# Patient Record
Sex: Male | Born: 1965 | Race: White | Hispanic: No | Marital: Single | Smoking: Former smoker
Health system: Southern US, Community
[De-identification: ages and names within clinical notes are randomized; demographics above are authoritative.]

## PROBLEM LIST (undated history)

## (undated) DIAGNOSIS — D649 Anemia, unspecified: Secondary | ICD-10-CM

## (undated) DIAGNOSIS — J9612 Chronic respiratory failure with hypercapnia: Secondary | ICD-10-CM

## (undated) DIAGNOSIS — I10 Essential (primary) hypertension: Secondary | ICD-10-CM

## (undated) DIAGNOSIS — T148XXD Other injury of unspecified body region, subsequent encounter: Secondary | ICD-10-CM

## (undated) DIAGNOSIS — M1990 Unspecified osteoarthritis, unspecified site: Secondary | ICD-10-CM

## (undated) DIAGNOSIS — K52831 Collagenous colitis: Secondary | ICD-10-CM

## (undated) DIAGNOSIS — J449 Chronic obstructive pulmonary disease, unspecified: Secondary | ICD-10-CM

## (undated) DIAGNOSIS — J45909 Unspecified asthma, uncomplicated: Secondary | ICD-10-CM

## (undated) DIAGNOSIS — K219 Gastro-esophageal reflux disease without esophagitis: Secondary | ICD-10-CM

## (undated) DIAGNOSIS — K2270 Barrett's esophagus without dysplasia: Secondary | ICD-10-CM

## (undated) DIAGNOSIS — J690 Pneumonitis due to inhalation of food and vomit: Secondary | ICD-10-CM

## (undated) DIAGNOSIS — G35 Multiple sclerosis: Secondary | ICD-10-CM

## (undated) DIAGNOSIS — N400 Enlarged prostate without lower urinary tract symptoms: Secondary | ICD-10-CM

## (undated) DIAGNOSIS — R0600 Dyspnea, unspecified: Secondary | ICD-10-CM

## (undated) HISTORY — PX: TONSILLECTOMY AND ADENOIDECTOMY: SHX28

## (undated) HISTORY — DX: Anemia, unspecified: D64.9

## (undated) HISTORY — DX: Barrett's esophagus without dysplasia: K22.70

## (undated) HISTORY — PX: HERNIA REPAIR: SHX51

## (undated) HISTORY — PX: OTHER SURGICAL HISTORY: SHX169

## (undated) HISTORY — DX: Gastro-esophageal reflux disease without esophagitis: K21.9

## (undated) HISTORY — DX: Collagenous colitis: K52.831

---

## 2011-11-27 ENCOUNTER — Telehealth: Payer: Self-pay | Admitting: *Deleted

## 2011-11-27 NOTE — Telephone Encounter (Signed)
Received records on patient from [place] stating that he needs PH impedance study and esophgeal manometry, per Dr Jarold Motto we can order a regular ph and mano but for a Impedance study can only be done at [facility], teresa notified and records placed in new patient file she will call back to let us know if patient needs the test after she talks to the MD at the [facility].

## 2011-11-30 DIAGNOSIS — IMO0002 Reserved for concepts with insufficient information to code with codable children: Secondary | ICD-10-CM | POA: Insufficient documentation

## 2012-11-28 ENCOUNTER — Inpatient Hospital Stay (HOSPITAL_COMMUNITY)
Admission: EM | Admit: 2012-11-28 | Discharge: 2012-12-01 | DRG: 193 | Disposition: A | Discharge status: Home / Self Care | Payer: Medicare Other | Attending: Internal Medicine | Admitting: Internal Medicine

## 2012-11-28 ENCOUNTER — Emergency Department (HOSPITAL_COMMUNITY): Payer: Medicare Other

## 2012-11-28 ENCOUNTER — Encounter (HOSPITAL_COMMUNITY): Payer: Self-pay | Admitting: *Deleted

## 2012-11-28 DIAGNOSIS — J189 Pneumonia, unspecified organism: Secondary | ICD-10-CM | POA: Diagnosis present

## 2012-11-28 DIAGNOSIS — J9600 Acute respiratory failure, unspecified whether with hypoxia or hypercapnia: Secondary | ICD-10-CM | POA: Diagnosis present

## 2012-11-28 DIAGNOSIS — I10 Essential (primary) hypertension: Secondary | ICD-10-CM | POA: Diagnosis present

## 2012-11-28 DIAGNOSIS — G35 Multiple sclerosis: Secondary | ICD-10-CM | POA: Diagnosis present

## 2012-11-28 DIAGNOSIS — G9340 Encephalopathy, unspecified: Secondary | ICD-10-CM | POA: Diagnosis present

## 2012-11-28 DIAGNOSIS — R7401 Elevation of levels of liver transaminase levels: Secondary | ICD-10-CM | POA: Diagnosis present

## 2012-11-28 DIAGNOSIS — G9349 Other encephalopathy: Secondary | ICD-10-CM | POA: Diagnosis present

## 2012-11-28 DIAGNOSIS — R112 Nausea with vomiting, unspecified: Secondary | ICD-10-CM | POA: Diagnosis present

## 2012-11-28 DIAGNOSIS — D72829 Elevated white blood cell count, unspecified: Secondary | ICD-10-CM | POA: Diagnosis present

## 2012-11-28 DIAGNOSIS — J154 Pneumonia due to other streptococci: Principal | ICD-10-CM | POA: Diagnosis present

## 2012-11-28 DIAGNOSIS — E876 Hypokalemia: Secondary | ICD-10-CM | POA: Diagnosis present

## 2012-11-28 DIAGNOSIS — G9341 Metabolic encephalopathy: Secondary | ICD-10-CM

## 2012-11-28 HISTORY — DX: Pneumonitis due to inhalation of food and vomit: J69.0

## 2012-11-28 HISTORY — DX: Gastro-esophageal reflux disease without esophagitis: K21.9

## 2012-11-28 HISTORY — DX: Benign prostatic hyperplasia without lower urinary tract symptoms: N40.0

## 2012-11-28 HISTORY — DX: Essential (primary) hypertension: I10

## 2012-11-28 HISTORY — DX: Multiple sclerosis: G35

## 2012-11-28 LAB — CG4 I-STAT (LACTIC ACID): Lactic Acid, Venous: 1.3 mmol/L (ref 0.5–2.2)

## 2012-11-28 LAB — URINALYSIS, ROUTINE W REFLEX MICROSCOPIC
Bilirubin Urine: NEGATIVE
Glucose, UA: NEGATIVE mg/dL
Ketones, ur: 40 mg/dL — AB
Leukocytes, UA: NEGATIVE
Nitrite: NEGATIVE
Protein, ur: 30 mg/dL — AB
Specific Gravity, Urine: 1.022 (ref 1.005–1.030)
Urobilinogen, UA: 0.2 mg/dL (ref 0.0–1.0)
pH: 6 (ref 5.0–8.0)

## 2012-11-28 LAB — COMPREHENSIVE METABOLIC PANEL
ALT: 203 U/L — ABNORMAL HIGH (ref 0–53)
AST: 491 U/L — ABNORMAL HIGH (ref 0–37)
Albumin: 3.6 g/dL (ref 3.5–5.2)
Alkaline Phosphatase: 73 U/L (ref 39–117)
BUN: 16 mg/dL (ref 6–23)
CO2: 29 mEq/L (ref 19–32)
Calcium: 9.9 mg/dL (ref 8.4–10.5)
Chloride: 97 mEq/L (ref 96–112)
Creatinine, Ser: 1.07 mg/dL (ref 0.50–1.35)
GFR calc Af Amer: >90 mL/min (ref >90)
GFR calc non Af Amer: 80 mL/min — ABNORMAL LOW (ref >90)
Glucose, Bld: 83 mg/dL (ref 70–99)
Potassium: 3.9 mEq/L (ref 3.5–5.1)
Sodium: 136 mEq/L (ref 135–145)
Total Bilirubin: 0.4 mg/dL (ref 0.3–1.2)
Total Protein: 8.1 g/dL (ref 6.0–8.3)

## 2012-11-28 LAB — CBC WITH DIFFERENTIAL/PLATELET
Basophils Absolute: 0 10*3/uL (ref 0.0–0.1)
Basophils Relative: 0 % (ref 0–1)
Eosinophils Absolute: 0.3 10*3/uL (ref 0.0–0.7)
Eosinophils Relative: 3 % (ref 0–5)
HCT: 36.4 % — ABNORMAL LOW (ref 39.0–52.0)
Hemoglobin: 11.6 g/dL — ABNORMAL LOW (ref 13.0–17.0)
Lymphocytes Relative: 8 % — ABNORMAL LOW (ref 12–46)
Lymphs Abs: 0.9 10*3/uL (ref 0.7–4.0)
MCH: 26.4 pg (ref 26.0–34.0)
MCHC: 31.9 g/dL (ref 30.0–36.0)
MCV: 82.7 fL (ref 78.0–100.0)
Monocytes Absolute: 1.2 10*3/uL — ABNORMAL HIGH (ref 0.1–1.0)
Monocytes Relative: 10 % (ref 3–12)
Neutro Abs: 10 10*3/uL — ABNORMAL HIGH (ref 1.7–7.7)
Neutrophils Relative %: 80 % — ABNORMAL HIGH (ref 43–77)
Platelets: 272 10*3/uL (ref 150–400)
RBC: 4.4 MIL/uL (ref 4.22–5.81)
RDW: 15.2 % (ref 11.5–15.5)
WBC: 12.4 10*3/uL — ABNORMAL HIGH (ref 4.0–10.5)

## 2012-11-28 LAB — PROTIME-INR
INR: 1.25 (ref 0.00–1.49)
Prothrombin Time: 15.5 seconds — ABNORMAL HIGH (ref 11.6–15.2)

## 2012-11-28 LAB — AMMONIA: Ammonia: 30 umol/L (ref 11–60)

## 2012-11-28 LAB — URINE MICROSCOPIC-ADD ON

## 2012-11-28 LAB — LIPASE, BLOOD: Lipase: 33 U/L (ref 11–59)

## 2012-11-28 LAB — GLUCOSE, CAPILLARY

## 2012-11-28 MED ORDER — LORAZEPAM 2 MG/ML IJ SOLN
1.0000 mg | Freq: Once | INTRAMUSCULAR | Status: AC
  Administered 2012-11-28: 1 mg via INTRAVENOUS
  Filled 2012-11-28: qty 1

## 2012-11-28 MED ORDER — SODIUM CHLORIDE 0.9 % IV BOLUS (SEPSIS)
2000.0000 mL | Freq: Once | INTRAVENOUS | Status: AC
  Administered 2012-11-28: 2000 mL via INTRAVENOUS

## 2012-11-28 MED ORDER — VANCOMYCIN HCL IN DEXTROSE 1-5 GM/200ML-% IV SOLN
1000.0000 mg | Freq: Once | INTRAVENOUS | Status: AC
  Administered 2012-11-28: 1000 mg via INTRAVENOUS
  Filled 2012-11-28: qty 200

## 2012-11-28 MED ORDER — ACETAMINOPHEN 325 MG PO TABS
650.0000 mg | ORAL_TABLET | Freq: Once | ORAL | Status: AC
  Administered 2012-11-28: 650 mg via ORAL
  Filled 2012-11-28: qty 2

## 2012-11-28 MED ORDER — ONDANSETRON HCL 4 MG/2ML IJ SOLN
4.0000 mg | Freq: Once | INTRAMUSCULAR | Status: AC
  Administered 2012-11-28: 4 mg via INTRAVENOUS
  Filled 2012-11-28: qty 2

## 2012-11-28 MED ORDER — PIPERACILLIN-TAZOBACTAM 3.375 G IVPB 30 MIN
3.3750 g | Freq: Once | INTRAVENOUS | Status: AC
  Administered 2012-11-28: 3.375 g via INTRAVENOUS
  Filled 2012-11-28: qty 50

## 2012-11-28 NOTE — ED Notes (Signed)
Patient transported to X-ray 
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
## 2012-11-28 NOTE — ED Notes (Signed)
Randy Greene- Fiance/POA  [phone] or [phone]
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
## 2012-11-28 NOTE — ED Notes (Signed)
Patient transported to CT 

## 2012-11-29 ENCOUNTER — Inpatient Hospital Stay (HOSPITAL_COMMUNITY): Payer: Medicare Other

## 2012-11-29 DIAGNOSIS — G35D Multiple sclerosis, unspecified: Secondary | ICD-10-CM | POA: Diagnosis present

## 2012-11-29 DIAGNOSIS — J189 Pneumonia, unspecified organism: Secondary | ICD-10-CM

## 2012-11-29 DIAGNOSIS — R7401 Elevation of levels of liver transaminase levels: Secondary | ICD-10-CM | POA: Diagnosis present

## 2012-11-29 DIAGNOSIS — G9341 Metabolic encephalopathy: Secondary | ICD-10-CM

## 2012-11-29 DIAGNOSIS — G9340 Encephalopathy, unspecified: Secondary | ICD-10-CM | POA: Diagnosis present

## 2012-11-29 DIAGNOSIS — G35 Multiple sclerosis: Secondary | ICD-10-CM | POA: Diagnosis present

## 2012-11-29 LAB — COMPREHENSIVE METABOLIC PANEL
ALT: 151 U/L — ABNORMAL HIGH (ref 0–53)
AST: 329 U/L — ABNORMAL HIGH (ref 0–37)
Albumin: 3 g/dL — ABNORMAL LOW (ref 3.5–5.2)
Alkaline Phosphatase: 58 U/L (ref 39–117)
BUN: 9 mg/dL (ref 6–23)
CO2: 28 mEq/L (ref 19–32)
Calcium: 8.8 mg/dL (ref 8.4–10.5)
Chloride: 99 mEq/L (ref 96–112)
Creatinine, Ser: 0.7 mg/dL (ref 0.50–1.35)
GFR calc Af Amer: >90 mL/min (ref >90)
GFR calc non Af Amer: >90 mL/min (ref >90)
Glucose, Bld: 114 mg/dL — ABNORMAL HIGH (ref 70–99)
Potassium: 3.1 mEq/L — ABNORMAL LOW (ref 3.5–5.1)
Sodium: 136 mEq/L (ref 135–145)
Total Bilirubin: 0.2 mg/dL — ABNORMAL LOW (ref 0.3–1.2)
Total Protein: 6.6 g/dL (ref 6.0–8.3)

## 2012-11-29 LAB — RAPID URINE DRUG SCREEN, HOSP PERFORMED
Barbiturates: NOT DETECTED
Benzodiazepines: NOT DETECTED
Cocaine: NOT DETECTED
Opiates: NOT DETECTED

## 2012-11-29 LAB — LEGIONELLA ANTIGEN, URINE

## 2012-11-29 LAB — ACETAMINOPHEN LEVEL: Acetaminophen (Tylenol), Serum: <15 ug/mL (ref 10–30)

## 2012-11-29 LAB — CBC
HCT: 31.1 % — ABNORMAL LOW (ref 39.0–52.0)
MCH: 27.1 pg (ref 26.0–34.0)
MCV: 82.5 fL (ref 78.0–100.0)
Platelets: 234 10*3/uL (ref 150–400)
RBC: 3.77 MIL/uL — ABNORMAL LOW (ref 4.22–5.81)

## 2012-11-29 LAB — HIV ANTIBODY (ROUTINE TESTING W REFLEX): HIV: NONREACTIVE

## 2012-11-29 MED ORDER — METOCLOPRAMIDE HCL 10 MG PO TABS
10.0000 mg | ORAL_TABLET | Freq: Every day | ORAL | Status: DC
  Administered 2012-11-29 – 2012-11-30 (×2): 10 mg via ORAL
  Filled 2012-11-29 (×3): qty 1

## 2012-11-29 MED ORDER — GABAPENTIN 300 MG PO CAPS
600.0000 mg | ORAL_CAPSULE | Freq: Three times a day (TID) | ORAL | Status: DC
  Administered 2012-11-29: 600 mg via ORAL
  Filled 2012-11-29 (×3): qty 2

## 2012-11-29 MED ORDER — OXYCODONE HCL 5 MG PO TABS
10.0000 mg | ORAL_TABLET | Freq: Four times a day (QID) | ORAL | Status: DC
  Administered 2012-11-29: 10 mg via ORAL
  Filled 2012-11-29 (×2): qty 1

## 2012-11-29 MED ORDER — DEXTROSE 5 % IV SOLN
500.0000 mg | INTRAVENOUS | Status: DC
  Administered 2012-11-29 – 2012-12-01 (×3): 500 mg via INTRAVENOUS
  Filled 2012-11-29 (×3): qty 500

## 2012-11-29 MED ORDER — BACLOFEN 10 MG PO TABS
10.0000 mg | ORAL_TABLET | Freq: Three times a day (TID) | ORAL | Status: DC
  Administered 2012-11-29 – 2012-12-01 (×6): 10 mg via ORAL
  Filled 2012-11-29 (×9): qty 1

## 2012-11-29 MED ORDER — HYDROXYZINE HCL 25 MG PO TABS
25.0000 mg | ORAL_TABLET | Freq: Two times a day (BID) | ORAL | Status: DC | PRN
  Filled 2012-11-29: qty 1

## 2012-11-29 MED ORDER — ATENOLOL 50 MG PO TABS
50.0000 mg | ORAL_TABLET | Freq: Every morning | ORAL | Status: DC
Start: 2012-11-30 — End: 2012-12-01
  Administered 2012-11-30 – 2012-12-01 (×2): 50 mg via ORAL
  Filled 2012-11-29 (×2): qty 1

## 2012-11-29 MED ORDER — ROPINIROLE HCL 0.5 MG PO TABS
0.5000 mg | ORAL_TABLET | Freq: Every day | ORAL | Status: DC
  Administered 2012-11-29 – 2012-11-30 (×2): 0.5 mg via ORAL
  Filled 2012-11-29 (×4): qty 1

## 2012-11-29 MED ORDER — ALBUTEROL SULFATE HFA 108 (90 BASE) MCG/ACT IN AERS: 2.0000 | INHALATION_SPRAY | Freq: Four times a day (QID) | RESPIRATORY_TRACT | Status: DC | PRN

## 2012-11-29 MED ORDER — ONDANSETRON HCL 8 MG PO TABS: 8.0000 mg | ORAL_TABLET | Freq: Three times a day (TID) | ORAL | Status: DC | PRN

## 2012-11-29 MED ORDER — POTASSIUM CHLORIDE CRYS ER 20 MEQ PO TBCR
40.0000 meq | EXTENDED_RELEASE_TABLET | Freq: Two times a day (BID) | ORAL | Status: AC
  Administered 2012-11-29 (×2): 40 meq via ORAL
  Filled 2012-11-29 (×2): qty 2

## 2012-11-29 MED ORDER — METHYLPHENIDATE HCL 5 MG PO TABS
10.0000 mg | ORAL_TABLET | Freq: Two times a day (BID) | ORAL | Status: DC
  Administered 2012-11-30 – 2012-12-01 (×3): 10 mg via ORAL
  Filled 2012-11-29: qty 1
  Filled 2012-11-29 (×2): qty 2

## 2012-11-29 MED ORDER — DIMETHYL FUMARATE 240 MG PO CPDR: 1.0000 | DELAYED_RELEASE_CAPSULE | Freq: Two times a day (BID) | ORAL | Status: DC

## 2012-11-29 MED ORDER — PANTOPRAZOLE SODIUM 40 MG PO TBEC
40.0000 mg | DELAYED_RELEASE_TABLET | Freq: Every day | ORAL | Status: DC
  Administered 2012-11-29 – 2012-12-01 (×3): 40 mg via ORAL
  Filled 2012-11-29 (×3): qty 1

## 2012-11-29 MED ORDER — OXYCODONE HCL 5 MG PO TABS
15.0000 mg | ORAL_TABLET | Freq: Four times a day (QID) | ORAL | Status: DC
  Administered 2012-11-29 – 2012-12-01 (×6): 15 mg via ORAL
  Filled 2012-11-29: qty 3
  Filled 2012-11-29: qty 1
  Filled 2012-11-29 (×2): qty 3
  Filled 2012-11-29: qty 2
  Filled 2012-11-29 (×2): qty 3

## 2012-11-29 MED ORDER — SODIUM CHLORIDE 0.9 % IV SOLN
INTRAVENOUS | Status: DC
  Administered 2012-11-29 (×2): via INTRAVENOUS

## 2012-11-29 MED ORDER — DEXTROSE 5 % IV SOLN
1.0000 g | INTRAVENOUS | Status: DC
  Administered 2012-11-29 – 2012-12-01 (×3): 1 g via INTRAVENOUS
  Filled 2012-11-29 (×3): qty 10

## 2012-11-29 MED ORDER — ENOXAPARIN SODIUM 30 MG/0.3ML ~~LOC~~ SOLN
30.0000 mg | SUBCUTANEOUS | Status: DC
  Administered 2012-11-29: 30 mg via SUBCUTANEOUS
  Filled 2012-11-29: qty 0.3

## 2012-11-29 MED ORDER — ENOXAPARIN SODIUM 40 MG/0.4ML ~~LOC~~ SOLN
40.0000 mg | SUBCUTANEOUS | Status: DC
  Administered 2012-11-30 – 2012-12-01 (×2): 40 mg via SUBCUTANEOUS
  Filled 2012-11-29 (×2): qty 0.4

## 2012-11-29 MED ORDER — DICYCLOMINE HCL 10 MG PO CAPS
10.0000 mg | ORAL_CAPSULE | Freq: Three times a day (TID) | ORAL | Status: DC
  Administered 2012-11-29 – 2012-12-01 (×7): 10 mg via ORAL
  Filled 2012-11-29 (×12): qty 1

## 2012-11-29 MED ORDER — DRONABINOL 5 MG PO CAPS
5.0000 mg | ORAL_CAPSULE | Freq: Three times a day (TID) | ORAL | Status: DC
  Administered 2012-11-29 – 2012-12-01 (×6): 5 mg via ORAL
  Filled 2012-11-29: qty 1
  Filled 2012-11-29: qty 2
  Filled 2012-11-29 (×4): qty 1

## 2012-11-29 MED ORDER — GABAPENTIN 300 MG PO CAPS
300.0000 mg | ORAL_CAPSULE | Freq: Three times a day (TID) | ORAL | Status: DC
  Administered 2012-11-29 – 2012-12-01 (×6): 300 mg via ORAL
  Filled 2012-11-29 (×9): qty 1
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
## 2012-11-29 NOTE — Progress Notes (Signed)
Patient was admitted earlier today. H&P reviewed.  S: Patient feels better. Denies any pain. No vomiting or nausea.  O: VSS No distress. Crackles left base. No wheezing. S1S2 normal regular. No s3 s4. No rubs, murmurs or bruits. Abdomen is soft. No RUQ tenderness. No tenderness elsewhere. BS present. No masses or organomegaly. He is alert and oriented x 3. No focal deficits. No edema  Labs reviewed.  A/P: Acute Encephalopathy Appears to have resolved. Unclear etiology. Ct was unremarkable. Hold off on MRI for now. Could be from infection.  Transaminitis Slightly better today. He still has a gallbladder but is non tender in RUQ. Will obtain US. Continue to monitor LFT's. Tecfidera (MS medicine) can cause abnormal AST. Will hold this medicine for now.  LLL Pneumonia: CAP vs aspiration Strep is positive. Continue current abx. Patient is stable.  History of MS Stable.Monitor  Hypokalemia Replete  If [place] looks benign he can be transferred to MedSurg bed.  Randy Greene 11/29/2012 8:43 AM

## 2012-11-29 NOTE — ED Notes (Signed)
Called Clydie Braun (pts fiance) to make her aware of pts status and location.

## 2012-11-29 NOTE — Progress Notes (Signed)
CARE MANAGEMENT NOTE 11/29/2012  Patient:  Randy Greene, Randy Greene   Account Number:  [id number]  Date Initiated:  11/29/2012  Documentation initiated by:  Yandiel Bergum  Subjective/Objective Assessment:   pt with known hisotry of ms present with ams due to unknown cause.     Action/Plan:   from home lives with spouse   Anticipated DC Date:  12/02/2012   Anticipated DC Plan:  HOME/SELF CARE  In-house referral  NA      DC Planning Services  NA      [facility] Choice  NA   Choice offered to / List presented to:  NA   DME arranged  NA      DME agency  NA     HH arranged  NA      HH agency  NA   Status of service:  In process, will continue to follow Medicare Important Message given?  NA - LOS <3 / Initial given by admissions (If response is "NO", the following Medicare IM given date fields will be blank) Date Medicare IM given:   Date Additional Medicare IM given:    Discharge Disposition:    Per UR Regulation:  Reviewed for med. necessity/level of care/duration of stay  If discussed at Long Length of Stay Meetings, dates discussed:    Comments:  [id number]/Armida Vickroy Earlene Plater, RN, BSN, CCM:  CHART REVIEWED AND UPDATED.  Next chart review due on [id number]. NO DISCHARGE NEEDS PRESENT AT THIS TIME. CASE MANAGEMENT [phone]

## 2012-11-30 DIAGNOSIS — G35 Multiple sclerosis: Secondary | ICD-10-CM

## 2012-11-30 DIAGNOSIS — E876 Hypokalemia: Secondary | ICD-10-CM | POA: Diagnosis present

## 2012-11-30 DIAGNOSIS — G9340 Encephalopathy, unspecified: Secondary | ICD-10-CM

## 2012-11-30 LAB — HEPATITIS PANEL, ACUTE
HCV Ab: NEGATIVE
Hep A IgM: NEGATIVE
Hepatitis B Surface Ag: NEGATIVE

## 2012-11-30 LAB — CBC
HCT: 32.6 % — ABNORMAL LOW (ref 39.0–52.0)
MCH: 25.9 pg — ABNORMAL LOW (ref 26.0–34.0)
MCHC: 31.6 g/dL (ref 30.0–36.0)
MCV: 81.9 fL (ref 78.0–100.0)
Platelets: 238 10*3/uL (ref 150–400)
RDW: 15.3 % (ref 11.5–15.5)

## 2012-11-30 LAB — COMPREHENSIVE METABOLIC PANEL
AST: 170 U/L — ABNORMAL HIGH (ref 0–37)
Albumin: 2.9 g/dL — ABNORMAL LOW (ref 3.5–5.2)
BUN: 4 mg/dL — ABNORMAL LOW (ref 6–23)
Calcium: 8.9 mg/dL (ref 8.4–10.5)
Chloride: 102 mEq/L (ref 96–112)
Creatinine, Ser: 0.7 mg/dL (ref 0.50–1.35)
Total Bilirubin: 0.2 mg/dL — ABNORMAL LOW (ref 0.3–1.2)

## 2012-11-30 MED ORDER — POTASSIUM CHLORIDE CRYS ER 20 MEQ PO TBCR
40.0000 meq | EXTENDED_RELEASE_TABLET | Freq: Once | ORAL | Status: AC
  Administered 2012-11-30: 40 meq via ORAL
  Filled 2012-11-30: qty 2

## 2012-11-30 NOTE — Progress Notes (Addendum)
[facility] PROGRESS NOTE  Randy Greene NFA:[id number] DOB: [birth year] DOA: 11/28/2012 PCP: No primary provider on file.  Brief narrative: 47 yo male with history of MS who was admitted to [facility] ED 11/28/2012 for acute encephalopathy. In addition, patient had nausea and vomiting associated with fevers and chills. Due to mental status changes patient was not a good historian at the time of admission. His mental status at this time is back to baseline where he is alert and oriented to time, place and person. Patient was subsequently found to have left lower lobe pneumonia and was started on azithromycin and ceftriaxone.  Assessment and Plan:   Principal problem: Acute Encephalopathy  - Likely related to infectious process, left lower lobe pneumonia - Urine drug screen positive for THC - Ethanol negative  - mental status is back to baseline with patient being alert and oriented to time, place and person  Active problems: Acute hypoxic respiratory failure  - Related to left lower lobe pneumonia, strep pneumonia - Patient initially admitted to step down unit and now on medical floor - Strep urine antigen is positive for strep pneumonia - Urine Legionella negative - Continue azithromycin and ceftriaxone - Blood cultures to date negative Leukocytosis  - likely secondary to PNA  - Continue antibiotics as above Transaminitis  - unclear etiology - UDS positive for THC; ethanol negative - repeat CMET in AM   Code Status: Full code Family Communication: No family at the bedside Disposition Plan: Home in next 24 hours  Manson Passey, MD  [facility] Pager [phone]  If 7PM-7AM, please contact night-coverage [email or web address] Password [facility] 11/30/2012, 11:31 AM   LOS: 2 days   Consultants:  None  Procedures:  None  Antibiotics:  Azithromycin 11/28/2012 -->  Rocephin 11/28/2012 -->  HPI/Subjective: No acute overnight events.  Objective: Filed Vitals:   11/29/12 1445 11/29/12 1600  11/29/12 1816 11/30/12 0425  BP:   129/79 149/85  Pulse:   91 85  Temp: 99 F (37.2 C) 98.2 F (36.8 C) 98.8 F (37.1 C) 98.9 F (37.2 C)  TempSrc: Oral Oral Oral Oral  Resp:   18 15  Height:   6\' 2"  (1.88 m)   Weight:   92.987 kg (205 lb)   SpO2:   98% 96%    Intake/Output Summary (Last 24 hours) at 11/30/12 1131 Last data filed at 11/30/12 0900  Gross per 24 hour  Intake   1530 ml  Output      0 ml  Net   1530 ml    Exam:   General:  Pt is alert, follows commands appropriately, not in acute distress  Cardiovascular: Regular rate and rhythm, S1/S2, no murmurs, no rubs, no gallops  Respiratory: Clear to auscultation bilaterally, no wheezing, no crackles, no rhonchi  Abdomen: Soft, non tender, non distended, bowel sounds present, no guarding  Extremities: No edema, pulses DP and PT palpable bilaterally  Neuro: Grossly nonfocal  Data Reviewed: Basic Metabolic Panel:  Recent Labs Lab 11/28/12 2008 11/29/12 0520 11/30/12 0405  NA 136 136 137  K 3.9 3.1* 3.4*  CL 97 99 102  CO2 29 28 26   GLUCOSE 83 114* 112*  BUN 16 9 4*  CREATININE 1.07 0.70 0.70  CALCIUM 9.9 8.8 8.9   Liver Function Tests:  Recent Labs Lab 11/28/12 2008 11/29/12 0520 11/30/12 0405  AST 491* 329* 170*  ALT 203* 151* 123*  ALKPHOS 73 58 53  BILITOT 0.4 0.2* 0.2*  PROT 8.1 6.6 6.3  ALBUMIN 3.6  3.0* 2.9*    Recent Labs Lab 11/28/12 2008  LIPASE 33    Recent Labs Lab 11/28/12 2130  AMMONIA 30   CBC:  Recent Labs Lab 11/28/12 2008 11/29/12 0520 11/30/12 0405  WBC 12.4* 10.0 6.1  NEUTROABS 10.0*  --   --   HGB 11.6* 10.2* 10.3*  HCT 36.4* 31.1* 32.6*  MCV 82.7 82.5 81.9  PLT 272 234 238   Cardiac Enzymes: No results found for this basename: CKTOTAL, CKMB, CKMBINDEX, TROPONINI,  in the last 168 hours BNP: No components found with this basename: POCBNP,  CBG:  Recent Labs Lab 11/28/12 1949  GLUCAP 87    CULTURE, BLOOD (ROUTINE X 2)     Status: None    Collection Time    11/28/12  8:30 PM      Result Value Range Status   Specimen Description BLOOD RIGHT ANTECUBITAL   Final   Special Requests BOTTLES DRAWN AEROBIC AND ANAEROBIC 5CC   Final   Culture  Setup Time 11/29/2012 03:49   Final   Culture     Final   Value:        BLOOD CULTURE RECEIVED NO GROWTH TO DATE CULTURE WILL BE HELD FOR 5 DAYS BEFORE ISSUING A FINAL NEGATIVE REPORT   Report Status PENDING   Incomplete  CULTURE, BLOOD (ROUTINE X 2)     Status: None   Collection Time    11/28/12  9:05 PM      Result Value Range Status   Specimen Description BLOOD LEFT HAND   Final   Special Requests BOTTLES DRAWN AEROBIC ONLY 3CC   Final   Culture  Setup Time 11/29/2012 03:47   Final   Culture     Final   Value:        BLOOD CULTURE RECEIVED NO GROWTH TO DATE CULTURE WILL BE HELD FOR 5 DAYS BEFORE ISSUING A FINAL NEGATIVE REPORT   Report Status PENDING   Incomplete  MRSA PCR SCREENING     Status: None   Collection Time    11/29/12  2:29 AM      Result Value Range Status   MRSA by PCR NEGATIVE  NEGATIVE Final     Studies: Dg Chest 2 View 11/28/2012   *RADIOLOGY REPORT*  Clinical Data: Fever, cough  CHEST - 2 VIEW  Comparison: None.  Findings: Patchy left lower lobe airspace opacities. The right lung clear.  Mild cardiomegaly.  No effusion.  Regional bones unremarkable.  IMPRESSION:  1.  Patchy left lower lobe airspace opacities suggesting pneumonia.   Original Report Authenticated By: D. Andria Rhein, MD   Ct Head Wo Contrast 11/28/2012   *RADIOLOGY REPORT*  Clinical Data: Nausea and vomiting last week.  Altered mental status with difficulty in waking.  Confusion.  Multiple falls of home today.  History of multiple sclerosis.  CT HEAD WITHOUT CONTRAST  Technique:  Contiguous axial images were obtained from the base of the skull through the vertex without contrast.  Comparison: None.  Findings: Technically limited study due to patient motion during the examination.  Although multiple serieses  are obtained, there is only segmental imaging of the brain and the entire intracranial contents do not appear be included within the study.  Visualized intracranial contents appear unremarkable.  There is no obvious mass effect or midline shift.  Visualized parenchymal pattern is normal.  No acute intracranial hemorrhage demonstrated in the visualized portions of the brain.  There is evidence of inflammatory change in the paranasal sinuses  with mucosal thickening in the frontal sinuses and ethmoid air cells.  IMPRESSION: Technically limited study due to motion artifact.  The entire intracranial contents are not included on the study.  Visualized intracranial contents demonstrate no acute changes.   Original Report Authenticated By: Burman Nieves, M.D.   US Abdomen Complete 11/29/2012   *RADIOLOGY REPORT*  Abdominal ultrasound  History:  Abnormal liver enzymes  Findings:  Gallbladder is visualized in multiple projections. There are no gallstones, gallbladder wall thickening, or pericholecystic fluid collection.  There is no intrahepatic, common hepatic, or common bile duct dilatation.  Pancreas is obscured by gas and not seen.  No focal liver lesions are appreciated.  There are occasional calcified splenic granulomas.  Spleen otherwise appears normal.  There is a 5 mm echogenic focus in the upper pole of the right kidney.  Kidneys bilaterally otherwise appear normal.  There is no ascites.  Aorta is nonaneurysmal.  Inferior vena cava appears patent.  Conclusion:  Pancreas obscured by gas.  Occasional splenic granulomas.  There is a 5 mm echogenic focus in the upper pole of the right kidney which prior represents a small angiomyolipoma. This finding warrants a followup study in 6 to 12 months to assess for stability.  Study otherwise unremarkable.   Original Report Authenticated By: Bretta Bang, M.D.    Scheduled Meds: . atenolol  50 mg Oral q morning - 10a  . azithromycin  500 mg Intravenous Q24H  .  baclofen  10 mg Oral TID  . cefTRIAXone  1 g Intravenous Q24H  . dicyclomine  10 mg Oral TID AC & HS  . enoxaparin (LOVENOX)   40 mg Subcutaneous Q24H  . gabapentin  300 mg Oral TID  . metoCLOPramide  10 mg Oral QHS  . oxyCODONE  15 mg Oral QID  . pantoprazole  40 mg Oral Daily  . rOPINIRole  0.5 mg Oral QHS

## 2012-12-01 DIAGNOSIS — R7402 Elevation of levels of lactic acid dehydrogenase (LDH): Secondary | ICD-10-CM

## 2012-12-01 LAB — BASIC METABOLIC PANEL
BUN: 5 mg/dL — ABNORMAL LOW (ref 6–23)
Calcium: 9 mg/dL (ref 8.4–10.5)
GFR calc Af Amer: >90 mL/min (ref >90)
GFR calc non Af Amer: >90 mL/min (ref >90)
Glucose, Bld: 99 mg/dL (ref 70–99)
Potassium: 3.6 mEq/L (ref 3.5–5.1)
Sodium: 137 mEq/L (ref 135–145)

## 2012-12-01 MED ORDER — LEVOFLOXACIN 500 MG PO TABS: 500.0000 mg | ORAL_TABLET | Freq: Every day | ORAL | Status: DC

## 2012-12-01 NOTE — Discharge Summary (Signed)
Physician Discharge Summary  Sajjad Vana AVW:[id number] DOB: Feb 05, 1966 DOA: 11/28/2012  PCP: No primary provider on file.  Admit date: 11/28/2012 Discharge date: 12/01/2012  Recommendations for Outpatient Follow-up:  1. Follow up with PCP in 1-2 weeks post discahrge  Discharge Diagnoses:  Principal Problem:   CAP (community acquired pneumonia) Active Problems:   Transaminitis   Acute encephalopathy   MS (multiple sclerosis)   Hypokalemia   Discharge Condition: medically stable for discharge home today  Diet recommendation: as toelrated  History of present illness:  47 yo male with history of MS who was admitted to [facility] ED 11/28/2012 for acute encephalopathy. In addition, patient had nausea and vomiting associated with fevers and chills. Due to mental status changes patient was not a good historian at the time of admission. His mental status at this time is back to baseline where he is alert and oriented to time, place and person. Patient was subsequently found to have left lower lobe pneumonia and was started on azithromycin and ceftriaxone.   Assessment and Plan:   Principal problem:  Acute Encephalopathy  - Likely related to infectious process, left lower lobe pneumonia  - Urine drug screen positive for THC  - Ethanol negative  - mental status is back to baseline with patient being alert and oriented to time, place and person  Active problems:  Acute hypoxic respiratory failure  - Related to left lower lobe pneumonia, strep pneumonia  - Patient initially admitted to step down unit and now on medical floor  - Strep urine antigen is positive for strep pneumonia  - Urine Legionella negative  - Blood cultures to date negative. We will stop azithromycin and ceftriaxone today. - we will continue 4 more days of Levaquin upon discharge  Leukocytosis  - likely secondary to PNA  - Continue antibiotics as above  Transaminitis  - unclear etiology  - UDS positive for THC; ethanol  negative   Code Status: Full code  Family Communication: No family at the bedside    Manson Passey, MD  [facility]  Pager [phone]   Consultants:  None Procedures:  None Antibiotics:  Azithromycin 11/28/2012 --> 12/01/2012 Rocephin 11/28/2012 --> 12/01/2012 Levaquin for 4 more days on discharge    Discharge Exam: Filed Vitals:   12/01/12 0500  BP: 129/88  Pulse: 64  Temp: 98.5 F (36.9 C)  Resp: 16   Filed Vitals:   11/30/12 1418 11/30/12 1436 11/30/12 2016 12/01/12 0500  BP: 138/88 138/88 137/94 129/88  Pulse: 70 76 64 64  Temp: 98.4 F (36.9 C)  98.6 F (37 C) 98.5 F (36.9 C)  TempSrc: Oral  Oral Oral  Resp: 18  18 16   Height:      Weight:      SpO2: 99%  98% 96%    General: Pt is alert, follows commands appropriately, not in acute distress Cardiovascular: Regular rate and rhythm, S1/S2 +, no murmurs, no rubs, no gallops Respiratory: Clear to auscultation bilaterally, no wheezing, no crackles, no rhonchi Abdominal: Soft, non tender, non distended, bowel sounds +, no guarding Extremities: no edema, no cyanosis, pulses palpable bilaterally DP and PT Neuro: Grossly nonfocal  Discharge Instructions  Discharge Orders   Future Orders Complete By Expires     Call MD for:  difficulty breathing, headache or visual disturbances  As directed     Call MD for:  persistant dizziness or light-headedness  As directed     Call MD for:  persistant nausea and vomiting  As directed  Call MD for:  severe uncontrolled pain  As directed     Diet - low sodium heart healthy  As directed     Increase activity slowly  As directed         Medication List    TAKE these medications       albuterol 108 (90 BASE) MCG/ACT inhaler  Commonly known as:  PROVENTIL HFA;VENTOLIN HFA  Inhale 2 puffs into the lungs every 6 (six) hours as needed for wheezing or shortness of breath.     atenolol 50 MG tablet  Commonly known as:  TENORMIN  Take 50 mg by mouth every morning.      atorvastatin 40 MG tablet  Commonly known as:  LIPITOR  Take 20 mg by mouth at bedtime.     B Complex Vitamins Liqd  Place under the tongue.     baclofen 10 MG tablet  Commonly known as:  LIORESAL  Take 10 mg by mouth 3 (three) times daily.     Calcium Carbonate 500 MG Chew  Chew 3 tablets by mouth daily.     dicyclomine 10 MG capsule  Commonly known as:  BENTYL  Take 10 mg by mouth 4 (four) times daily -  before meals and at bedtime.     donepezil 10 MG tablet  Commonly known as:  ARICEPT  Take 10 mg by mouth daily.     doxazosin 4 MG tablet  Commonly known as:  CARDURA  Take 2 mg by mouth at bedtime.     dronabinol 2.5 MG capsule  Commonly known as:  MARINOL  Take 5 mg by mouth 3 (three) times daily.     ergocalciferol 8000 UNIT/ML drops  Commonly known as:  DRISDOL  Take by mouth daily.     gabapentin 300 MG capsule  Commonly known as:  NEURONTIN  Take 600 mg by mouth 4 (four) times daily.     hydrOXYzine 25 MG tablet  Commonly known as:  ATARAX/VISTARIL  Take 25 mg by mouth 2 (two) times daily as needed for anxiety (sleep).     imiquimod 5 % cream  Commonly known as:  ALDARA  Apply 1 application topically 3 (three) times a week.     lactobacillus acidophilus Tabs  Take 2 tablets by mouth 2 (two) times daily.     levofloxacin 500 MG tablet  Commonly known as:  LEVAQUIN  Take 1 tablet (500 mg total) by mouth daily.     Menthol-Methyl Salicylate Crea  Apply 1 application topically 3 (three) times daily as needed (back pain).     methylphenidate 10 MG tablet  Commonly known as:  RITALIN  Take 10 mg by mouth 2 (two) times daily. Take one tablet in the morning and at noon     metoCLOPramide 10 MG tablet  Commonly known as:  REGLAN  Take 10 mg by mouth at bedtime.     modafinil 200 MG tablet  Commonly known as:  PROVIGIL  Take 200 mg by mouth 2 (two) times daily. Take one tablet in the morning and at noon.     NALTREXONE HCL PO  Take 4.5 mg by mouth  daily. Recommended dose is 4.5mg  daily.  Use a 50mg  pill and cut in 1/2 (25mg ).  Cut 1/2 pill into 6 pieces (4.17mg ) or 5 pieces (5mg ).     naproxen 500 MG tablet  Commonly known as:  NAPROSYN  Take 500 mg by mouth 2 (two) times daily as needed (pain). Take with  food.     ondansetron 8 MG tablet  Commonly known as:  ZOFRAN  Take 8 mg by mouth every 8 (eight) hours as needed for nausea.     oxyCODONE 15 MG immediate release tablet  Commonly known as:  ROXICODONE  Take 15 mg by mouth 4 (four) times daily.     pantoprazole 40 MG tablet  Commonly known as:  PROTONIX  Take 40 mg by mouth 2 (two) times daily.     potassium chloride 10 MEQ tablet  Commonly known as:  K-DUR,KLOR-CON  Take 10 mEq by mouth daily.     ranitidine 150 MG tablet  Commonly known as:  ZANTAC  Take 150 mg by mouth 2 (two) times daily.     rOPINIRole 0.5 MG tablet  Commonly known as:  REQUIP  Take 0.5 mg by mouth at bedtime.     Tadalafil (PAH) 20 MG Tabs  Take 20 mg by mouth daily as needed.     TECFIDERA 240 MG Cpdr  Generic drug:  Dimethyl Fumarate  Take 1 capsule by mouth 2 (two) times daily.          The results of significant diagnostics from this hospitalization (including imaging, microbiology, ancillary and laboratory) are listed below for reference.    Significant Diagnostic Studies: Dg Chest 2 View  11/28/2012   *RADIOLOGY REPORT*  Clinical Data: Fever, cough  CHEST - 2 VIEW  Comparison: None.  Findings: Patchy left lower lobe airspace opacities. The right lung clear.  Mild cardiomegaly.  No effusion.  Regional bones unremarkable.  IMPRESSION:  1.  Patchy left lower lobe airspace opacities suggesting pneumonia.   Original Report Authenticated By: D. Andria Rhein, MD   Ct Head Wo Contrast  11/28/2012   *RADIOLOGY REPORT*  Clinical Data: Nausea and vomiting last week.  Altered mental status with difficulty in waking.  Confusion.  Multiple falls of home today.  History of multiple sclerosis.  CT  HEAD WITHOUT CONTRAST  Technique:  Contiguous axial images were obtained from the base of the skull through the vertex without contrast.  Comparison: None.  Findings: Technically limited study due to patient motion during the examination.  Although multiple serieses are obtained, there is only segmental imaging of the brain and the entire intracranial contents do not appear be included within the study.  Visualized intracranial contents appear unremarkable.  There is no obvious mass effect or midline shift.  Visualized parenchymal pattern is normal.  No acute intracranial hemorrhage demonstrated in the visualized portions of the brain.  There is evidence of inflammatory change in the paranasal sinuses with mucosal thickening in the frontal sinuses and ethmoid air cells.  IMPRESSION: Technically limited study due to motion artifact.  The entire intracranial contents are not included on the study.  Visualized intracranial contents demonstrate no acute changes.   Original Report Authenticated By: Burman Nieves, M.D.   US Abdomen Complete  11/29/2012   *RADIOLOGY REPORT*  Abdominal ultrasound  History:  Abnormal liver enzymes  Findings:  Gallbladder is visualized in multiple projections. There are no gallstones, gallbladder wall thickening, or pericholecystic fluid collection.  There is no intrahepatic, common hepatic, or common bile duct dilatation.  Pancreas is obscured by gas and not seen.  No focal liver lesions are appreciated.  There are occasional calcified splenic granulomas.  Spleen otherwise appears normal.  There is a 5 mm echogenic focus in the upper pole of the right kidney.  Kidneys bilaterally otherwise appear normal.  There is no ascites.  Aorta is nonaneurysmal.  Inferior vena cava appears patent.  Conclusion:  Pancreas obscured by gas.  Occasional splenic granulomas.  There is a 5 mm echogenic focus in the upper pole of the right kidney which prior represents a small angiomyolipoma. This finding  warrants a followup study in 6 to 12 months to assess for stability.  Study otherwise unremarkable.   Original Report Authenticated By: Bretta Bang, M.D.    Microbiology: Recent Results (from the past 240 hour(s))  CULTURE, BLOOD (ROUTINE X 2)     Status: None   Collection Time    11/28/12  8:30 PM      Result Value Range Status   Specimen Description BLOOD RIGHT ANTECUBITAL   Final   Special Requests BOTTLES DRAWN AEROBIC AND ANAEROBIC 5CC   Final   Culture  Setup Time 11/29/2012 03:49   Final   Culture     Final   Value:        BLOOD CULTURE RECEIVED NO GROWTH TO DATE CULTURE WILL BE HELD FOR 5 DAYS BEFORE ISSUING A FINAL NEGATIVE REPORT   Report Status PENDING   Incomplete  CULTURE, BLOOD (ROUTINE X 2)     Status: None   Collection Time    11/28/12  9:05 PM      Result Value Range Status   Specimen Description BLOOD LEFT HAND   Final   Special Requests BOTTLES DRAWN AEROBIC ONLY 3CC   Final   Culture  Setup Time 11/29/2012 03:47   Final   Culture     Final   Value:        BLOOD CULTURE RECEIVED NO GROWTH TO DATE CULTURE WILL BE HELD FOR 5 DAYS BEFORE ISSUING A FINAL NEGATIVE REPORT   Report Status PENDING   Incomplete  MRSA PCR SCREENING     Status: None   Collection Time    11/29/12  2:29 AM      Result Value Range Status   MRSA by PCR NEGATIVE  NEGATIVE Final     Labs: Basic Metabolic Panel:  Recent Labs Lab 11/28/12 2008 11/29/12 0520 11/30/12 0405 12/01/12 0455  NA 136 136 137 137  K 3.9 3.1* 3.4* 3.6  CL 97 99 102 101  CO2 29 28 26 26   GLUCOSE 83 114* 112* 99  BUN 16 9 4* 5*  CREATININE 1.07 0.70 0.70 0.70  CALCIUM 9.9 8.8 8.9 9.0   Liver Function Tests:  Recent Labs Lab 11/28/12 2008 11/29/12 0520 11/30/12 0405  AST 491* 329* 170*  ALT 203* 151* 123*  ALKPHOS 73 58 53  BILITOT 0.4 0.2* 0.2*  PROT 8.1 6.6 6.3  ALBUMIN 3.6 3.0* 2.9*    Recent Labs Lab 11/28/12 2008  LIPASE 33    Recent Labs Lab 11/28/12 2130  AMMONIA 30    CBC:  Recent Labs Lab 11/28/12 2008 11/29/12 0520 11/30/12 0405  WBC 12.4* 10.0 6.1  NEUTROABS 10.0*  --   --   HGB 11.6* 10.2* 10.3*  HCT 36.4* 31.1* 32.6*  MCV 82.7 82.5 81.9  PLT 272 234 238   Cardiac Enzymes: No results found for this basename: CKTOTAL, CKMB, CKMBINDEX, TROPONINI,  in the last 168 hours BNP: BNP (last 3 results) No results found for this basename: PROBNP,  in the last 8760 hours CBG:  Recent Labs Lab 11/28/12 1949  GLUCAP 87    Time coordinating discharge: Over 30 minutes  Signed:  Manson Passey, MD  [facility]  12/01/2012, 9:55 AM  Pager #: [phone]

## 2012-12-01 NOTE — Progress Notes (Signed)
Pt discharged home in stable condition. Discharge instructions and scripts given. Pt verbalized understanding 

## 2012-12-05 LAB — CULTURE, BLOOD (ROUTINE X 2)
Culture: NO GROWTH
Culture: NO GROWTH

## 2013-04-10 ENCOUNTER — Emergency Department (HOSPITAL_COMMUNITY): Payer: Non-veteran care

## 2013-04-10 ENCOUNTER — Inpatient Hospital Stay (HOSPITAL_COMMUNITY)
Admission: EM | Admit: 2013-04-10 | Discharge: 2013-04-12 | DRG: 871 | Disposition: A | Discharge status: Home / Self Care | Payer: Non-veteran care | Attending: Internal Medicine | Admitting: Internal Medicine

## 2013-04-10 ENCOUNTER — Encounter (HOSPITAL_COMMUNITY): Payer: Self-pay | Admitting: Emergency Medicine

## 2013-04-10 DIAGNOSIS — G9340 Encephalopathy, unspecified: Secondary | ICD-10-CM | POA: Diagnosis present

## 2013-04-10 DIAGNOSIS — J690 Pneumonitis due to inhalation of food and vomit: Secondary | ICD-10-CM

## 2013-04-10 DIAGNOSIS — I10 Essential (primary) hypertension: Secondary | ICD-10-CM | POA: Diagnosis present

## 2013-04-10 DIAGNOSIS — J9600 Acute respiratory failure, unspecified whether with hypoxia or hypercapnia: Secondary | ICD-10-CM | POA: Diagnosis present

## 2013-04-10 DIAGNOSIS — E785 Hyperlipidemia, unspecified: Secondary | ICD-10-CM | POA: Diagnosis present

## 2013-04-10 DIAGNOSIS — R7401 Elevation of levels of liver transaminase levels: Secondary | ICD-10-CM | POA: Diagnosis present

## 2013-04-10 DIAGNOSIS — A419 Sepsis, unspecified organism: Principal | ICD-10-CM | POA: Diagnosis present

## 2013-04-10 DIAGNOSIS — R000 Tachycardia, unspecified: Secondary | ICD-10-CM | POA: Diagnosis present

## 2013-04-10 DIAGNOSIS — G35 Multiple sclerosis: Secondary | ICD-10-CM | POA: Diagnosis present

## 2013-04-10 DIAGNOSIS — G35D Multiple sclerosis, unspecified: Secondary | ICD-10-CM | POA: Diagnosis present

## 2013-04-10 DIAGNOSIS — R0602 Shortness of breath: Secondary | ICD-10-CM | POA: Diagnosis present

## 2013-04-10 DIAGNOSIS — Z87891 Personal history of nicotine dependence: Secondary | ICD-10-CM

## 2013-04-10 DIAGNOSIS — J189 Pneumonia, unspecified organism: Secondary | ICD-10-CM | POA: Diagnosis present

## 2013-04-10 DIAGNOSIS — E876 Hypokalemia: Secondary | ICD-10-CM

## 2013-04-10 DIAGNOSIS — R7402 Elevation of levels of lactic acid dehydrogenase (LDH): Secondary | ICD-10-CM | POA: Diagnosis present

## 2013-04-10 LAB — CBC
HCT: 38.9 % — ABNORMAL LOW (ref 39.0–52.0)
Hemoglobin: 12.5 g/dL — ABNORMAL LOW (ref 13.0–17.0)
RBC: 4.69 MIL/uL (ref 4.22–5.81)
RDW: 15.1 % (ref 11.5–15.5)
WBC: 13 10*3/uL — ABNORMAL HIGH (ref 4.0–10.5)

## 2013-04-10 LAB — RAPID URINE DRUG SCREEN, HOSP PERFORMED
Amphetamines: NOT DETECTED
Barbiturates: NOT DETECTED
Benzodiazepines: NOT DETECTED
Cocaine: POSITIVE — AB
Opiates: NOT DETECTED
Tetrahydrocannabinol: POSITIVE — AB

## 2013-04-10 LAB — BASIC METABOLIC PANEL
BUN: 9 mg/dL (ref 6–23)
CO2: 27 mEq/L (ref 19–32)
Chloride: 98 mEq/L (ref 96–112)
GFR calc Af Amer: >90 mL/min (ref >90)
Potassium: 3.7 mEq/L (ref 3.5–5.1)

## 2013-04-10 LAB — URINALYSIS, ROUTINE W REFLEX MICROSCOPIC
Hgb urine dipstick: NEGATIVE
Nitrite: NEGATIVE
Specific Gravity, Urine: 1.016 (ref 1.005–1.030)
Urobilinogen, UA: 0.2 mg/dL (ref 0.0–1.0)
pH: 7 (ref 5.0–8.0)

## 2013-04-10 LAB — STREP PNEUMONIAE URINARY ANTIGEN: Strep Pneumo Urinary Antigen: NEGATIVE

## 2013-04-10 LAB — CG4 I-STAT (LACTIC ACID): Lactic Acid, Venous: 2.2 mmol/L (ref 0.5–2.2)

## 2013-04-10 MED ORDER — DRONABINOL 2.5 MG PO CAPS
5.0000 mg | ORAL_CAPSULE | Freq: Three times a day (TID) | ORAL | Status: DC
  Administered 2013-04-10 – 2013-04-11 (×4): 5 mg via ORAL
  Filled 2013-04-10 (×5): qty 2

## 2013-04-10 MED ORDER — MODAFINIL 200 MG PO TABS: 200.0000 mg | ORAL_TABLET | Freq: Two times a day (BID) | ORAL | Status: DC

## 2013-04-10 MED ORDER — METHYLPHENIDATE HCL 5 MG PO TABS: 10.0000 mg | ORAL_TABLET | Freq: Two times a day (BID) | ORAL | Status: DC

## 2013-04-10 MED ORDER — ATENOLOL 50 MG PO TABS
50.0000 mg | ORAL_TABLET | Freq: Every morning | ORAL | Status: DC
  Administered 2013-04-11 – 2013-04-12 (×2): 50 mg via ORAL
  Filled 2013-04-10 (×2): qty 1

## 2013-04-10 MED ORDER — ROPINIROLE HCL 0.5 MG PO TABS
0.5000 mg | ORAL_TABLET | Freq: Every day | ORAL | Status: DC
  Administered 2013-04-10 – 2013-04-11 (×2): 0.5 mg via ORAL
  Filled 2013-04-10 (×3): qty 1

## 2013-04-10 MED ORDER — DONEPEZIL HCL 10 MG PO TABS
10.0000 mg | ORAL_TABLET | Freq: Every day | ORAL | Status: DC
  Administered 2013-04-10 – 2013-04-11 (×2): 10 mg via ORAL
  Filled 2013-04-10 (×3): qty 1

## 2013-04-10 MED ORDER — ERGOCALCIFEROL 8000 UNIT/ML PO SOLN
4000.0000 [IU] | Freq: Every day | ORAL | Status: DC
  Administered 2013-04-11 – 2013-04-12 (×2): 4000 [IU] via ORAL
  Filled 2013-04-10 (×2): qty 0.5

## 2013-04-10 MED ORDER — OXYCODONE HCL 5 MG PO TABS
10.0000 mg | ORAL_TABLET | ORAL | Status: DC | PRN
  Administered 2013-04-10 – 2013-04-12 (×3): 10 mg via ORAL
  Filled 2013-04-10 (×4): qty 2

## 2013-04-10 MED ORDER — DALFAMPRIDINE ER 10 MG PO TB12
10.0000 mg | ORAL_TABLET | Freq: Two times a day (BID) | ORAL | Status: DC
  Administered 2013-04-11 (×2): 10 mg via ORAL

## 2013-04-10 MED ORDER — LEVOFLOXACIN 500 MG PO TABS: 750.0000 mg | ORAL_TABLET | Freq: Every day | ORAL | Status: DC

## 2013-04-10 MED ORDER — PANTOPRAZOLE SODIUM 40 MG PO TBEC
40.0000 mg | DELAYED_RELEASE_TABLET | Freq: Two times a day (BID) | ORAL | Status: DC
  Administered 2013-04-10 – 2013-04-12 (×4): 40 mg via ORAL
  Filled 2013-04-10 (×5): qty 1

## 2013-04-10 MED ORDER — ONDANSETRON HCL 4 MG/2ML IJ SOLN
4.0000 mg | Freq: Once | INTRAMUSCULAR | Status: AC
  Administered 2013-04-10: 4 mg via INTRAVENOUS
  Filled 2013-04-10: qty 2

## 2013-04-10 MED ORDER — POTASSIUM CHLORIDE CRYS ER 10 MEQ PO TBCR
10.0000 meq | EXTENDED_RELEASE_TABLET | Freq: Every day | ORAL | Status: DC
  Administered 2013-04-10 – 2013-04-12 (×3): 10 meq via ORAL
  Filled 2013-04-10 (×3): qty 1

## 2013-04-10 MED ORDER — DIMETHYL FUMARATE 240 MG PO CPDR: 1.0000 | DELAYED_RELEASE_CAPSULE | Freq: Two times a day (BID) | ORAL | Status: DC

## 2013-04-10 MED ORDER — NAPROXEN 500 MG PO TABS
500.0000 mg | ORAL_TABLET | Freq: Two times a day (BID) | ORAL | Status: DC | PRN
  Administered 2013-04-11 (×2): 500 mg via ORAL
  Filled 2013-04-10 (×2): qty 1

## 2013-04-10 MED ORDER — GABAPENTIN 300 MG PO CAPS
600.0000 mg | ORAL_CAPSULE | Freq: Four times a day (QID) | ORAL | Status: DC
  Administered 2013-04-10 – 2013-04-12 (×5): 600 mg via ORAL
  Filled 2013-04-10 (×9): qty 2

## 2013-04-10 MED ORDER — ALBUTEROL SULFATE HFA 108 (90 BASE) MCG/ACT IN AERS: 2.0000 | INHALATION_SPRAY | Freq: Four times a day (QID) | RESPIRATORY_TRACT | Status: DC | PRN

## 2013-04-10 MED ORDER — ONDANSETRON HCL 4 MG PO TABS
8.0000 mg | ORAL_TABLET | Freq: Three times a day (TID) | ORAL | Status: DC | PRN
  Administered 2013-04-10 – 2013-04-11 (×2): 8 mg via ORAL
  Filled 2013-04-10 (×2): qty 2

## 2013-04-10 MED ORDER — SODIUM CHLORIDE 0.9 % IV SOLN
Freq: Once | INTRAVENOUS | Status: AC
  Administered 2013-04-10: 125 mL/h via INTRAVENOUS

## 2013-04-10 MED ORDER — FAMOTIDINE 20 MG PO TABS
20.0000 mg | ORAL_TABLET | Freq: Every day | ORAL | Status: DC
  Administered 2013-04-10 – 2013-04-12 (×3): 20 mg via ORAL
  Filled 2013-04-10 (×3): qty 1

## 2013-04-10 MED ORDER — AZITHROMYCIN 500 MG PO TABS
500.0000 mg | ORAL_TABLET | ORAL | Status: DC
  Administered 2013-04-10 – 2013-04-11 (×2): 500 mg via ORAL
  Filled 2013-04-10 (×3): qty 1

## 2013-04-10 MED ORDER — LEVOFLOXACIN IN D5W 750 MG/150ML IV SOLN
750.0000 mg | Freq: Once | INTRAVENOUS | Status: AC
  Administered 2013-04-10: 750 mg via INTRAVENOUS
  Filled 2013-04-10: qty 150

## 2013-04-10 MED ORDER — DEXTROSE 5 % IV SOLN
1.0000 g | INTRAVENOUS | Status: DC
  Administered 2013-04-10 – 2013-04-11 (×2): 1 g via INTRAVENOUS
  Filled 2013-04-10 (×2): qty 10

## 2013-04-10 MED ORDER — BACID PO TABS
2.0000 | ORAL_TABLET | Freq: Two times a day (BID) | ORAL | Status: DC
  Administered 2013-04-10 – 2013-04-11 (×3): 2 via ORAL
  Filled 2013-04-10 (×5): qty 2

## 2013-04-10 MED ORDER — SODIUM CHLORIDE 0.9 % IV SOLN
INTRAVENOUS | Status: DC
  Administered 2013-04-10 – 2013-04-11 (×3): via INTRAVENOUS

## 2013-04-10 MED ORDER — METHYLPHENIDATE HCL 5 MG PO TABS
10.0000 mg | ORAL_TABLET | Freq: Two times a day (BID) | ORAL | Status: DC
  Administered 2013-04-11 – 2013-04-12 (×2): 10 mg via ORAL
  Filled 2013-04-10 (×2): qty 2

## 2013-04-10 MED ORDER — DOXAZOSIN MESYLATE 2 MG PO TABS
2.0000 mg | ORAL_TABLET | Freq: Every day | ORAL | Status: DC
  Administered 2013-04-10 – 2013-04-11 (×2): 2 mg via ORAL
  Filled 2013-04-10 (×3): qty 1

## 2013-04-10 MED ORDER — ENOXAPARIN SODIUM 40 MG/0.4ML ~~LOC~~ SOLN
40.0000 mg | SUBCUTANEOUS | Status: DC
  Administered 2013-04-10 – 2013-04-11 (×2): 40 mg via SUBCUTANEOUS
  Filled 2013-04-10 (×3): qty 0.4

## 2013-04-10 MED ORDER — BACLOFEN 10 MG PO TABS
10.0000 mg | ORAL_TABLET | Freq: Three times a day (TID) | ORAL | Status: DC
  Administered 2013-04-10 – 2013-04-12 (×5): 10 mg via ORAL
  Filled 2013-04-10 (×7): qty 1

## 2013-04-10 MED ORDER — MODAFINIL 200 MG PO TABS
200.0000 mg | ORAL_TABLET | Freq: Two times a day (BID) | ORAL | Status: DC
  Administered 2013-04-11 – 2013-04-12 (×2): 200 mg via ORAL
  Filled 2013-04-10 (×2): qty 1

## 2013-04-10 MED ORDER — ERGOCALCIFEROL 8000 UNIT/ML PO SOLN
4000.0000 [IU] | Freq: Every day | ORAL | Status: DC
  Filled 2013-04-10: qty 0.5

## 2013-04-10 MED ORDER — METOCLOPRAMIDE HCL 10 MG PO TABS
10.0000 mg | ORAL_TABLET | Freq: Every day | ORAL | Status: DC
  Administered 2013-04-10 – 2013-04-11 (×2): 10 mg via ORAL
  Filled 2013-04-10 (×3): qty 1

## 2013-04-10 MED ORDER — ATORVASTATIN CALCIUM 20 MG PO TABS
20.0000 mg | ORAL_TABLET | Freq: Every day | ORAL | Status: DC
  Administered 2013-04-10 – 2013-04-11 (×2): 20 mg via ORAL
  Filled 2013-04-10 (×3): qty 1

## 2013-04-10 NOTE — Progress Notes (Signed)
UR completed 

## 2013-04-10 NOTE — ED Notes (Signed)
Pt hx of MS, has been treated for aspiration pnuemonoa at least 4 times this year. Jan, June, July, and August.  genearlly due to pts MS treatment he is immunocompromised and his regular temperature is 95-96 degrees F. Wife reports pt temp of 99.5 is very  febrile for him. In past pt has declined very rapidly. In Aug on BiPap almost intubated.

## 2013-04-10 NOTE — Progress Notes (Signed)
Pt arrived to unit on stretcher, amb to bed w/ steady gait using cane. Oriented to callbell and environment. Pt vomited upon arrival. PO Zofran given as ordered. Will give report to oncoming night RN.

## 2013-04-10 NOTE — H&P (Signed)
[facility] History and Physical  Randy Greene BJY:[id number] DOB: [birth year] DOA: 04/10/2013  Referring physician: ED physician PCP: No primary provider on file.   Chief Complaint: shortness of breath  HPI:  Pt is 47 yo male with MS who presents to [facility] ED with main concern of progressively worsening shortness of breath that started several days prior to this admission, associated with subjective fevers, chills, poor oral intake, productive cough of yellow sputum. Pt denies any specific alleviating or aggravating factors, no recent sick contacts or exposures, no recent sicknesses. He denies abdominal or urinary concerns.   In ED, pt stable but oxygen saturation dropped to 80% but quickly improved with oxygen via [place]. [facility] asked to admit for management of PNA. Tele bed requested.    Assessment and Plan: Acute hypoxic respiratory failure - secondary to PNA as noted on CXR and per clinical symptoms - will place on telemetry bed, provide supportive care with empiric ABX for now, oxygen as needed - place on antitussive as needed - IVF and analgesia as needed - sputum analysis ordered, urine legionella and strep penumo HTN - continue home medical regimen HLD - continue statis MS - stable - continue home medical regimen   Code Status: Full Family Communication: Pt at bedside Disposition Plan: Admit to telemetry bed   Review of Systems:  Constitutional: Negative for diaphoresis.  HENT: Negative for hearing loss, ear pain, nosebleeds, congestion, sore throat, neck pain, tinnitus and ear discharge.   Eyes: Negative for blurred vision, double vision, photophobia, pain, discharge and redness.  Respiratory: Per HPI  Cardiovascular: Negative for chest pain, palpitations, orthopnea, claudication and leg swelling.  Gastrointestinal: Negative for heartburn, constipation, blood in stool and melena.  Genitourinary: Negative for dysuria, urgency, frequency, hematuria and flank pain.   Musculoskeletal: Negative for myalgias, joint pain and falls.  Skin: Negative for itching and rash.  Neurological: Negative for tingling, tremors, sensory change, speech change, focal weakness, loss of consciousness and headaches.  Endo/Heme/Allergies: Negative for environmental allergies and polydipsia. Does not bruise/bleed easily.  Psychiatric/Behavioral: Negative for suicidal ideas. The patient is not nervous/anxious.      Past Medical History  Diagnosis Date  . MS (multiple sclerosis)   . Hypertension   . Enlarged prostate   . Acid reflux   . Aspiration pneumonia     Past Surgical History  Procedure Laterality Date  . Hernia repair      Social History:  reports that he has quit smoking. He has never used smokeless tobacco. He reports that he does not drink alcohol or use illicit drugs.  Allergies  Allergen Reactions  . Adhesive [Tape] Rash    History reviewed. No pertinent family history.  Prior to Admission medications   Medication Sig Start Date End Date Taking? Authorizing Provider  albuterol (PROVENTIL HFA;VENTOLIN HFA) 108 (90 BASE) MCG/ACT inhaler Inhale 2 puffs into the lungs every 6 (six) hours as needed for wheezing or shortness of breath.   Yes Historical Provider, MD  atenolol (TENORMIN) 50 MG tablet Take 50 mg by mouth every morning.   Yes Historical Provider, MD  atorvastatin (LIPITOR) 40 MG tablet Take 20 mg by mouth at bedtime.   Yes Historical Provider, MD  B Complex Vitamins LIQD Place under the tongue daily.    Yes Historical Provider, MD  baclofen (LIORESAL) 10 MG tablet Take 10 mg by mouth 3 (three) times daily.   Yes Historical Provider, MD  Calcium Carbonate 500 MG CHEW Chew 3 tablets by mouth daily.  Yes Historical Provider, MD  dalfampridine (AMPYRA) 10 MG TB12 Take 10 mg by mouth 2 (two) times daily.   Yes Historical Provider, MD  Dimethyl Fumarate (TECFIDERA) 240 MG CPDR Take 1 capsule by mouth 2 (two) times daily.   Yes Historical Provider, MD   donepezil (ARICEPT) 10 MG tablet Take 10 mg by mouth daily.   Yes Historical Provider, MD  doxazosin (CARDURA) 4 MG tablet Take 2 mg by mouth at bedtime.   Yes Historical Provider, MD  dronabinol (MARINOL) 2.5 MG capsule Take 5 mg by mouth 3 (three) times daily.   Yes Historical Provider, MD  ergocalciferol (DRISDOL) 8000 UNIT/ML drops Take 4,000 Units by mouth daily.    Yes Historical Provider, MD  gabapentin (NEURONTIN) 300 MG capsule Take 600 mg by mouth 4 (four) times daily.   Yes Historical Provider, MD  imiquimod (ALDARA) 5 % cream Apply 1 application topically 3 (three) times a week.   Yes Historical Provider, MD  lactobacillus acidophilus (BACID) TABS Take 2 tablets by mouth 2 (two) times daily.   Yes Historical Provider, MD  methylphenidate (RITALIN) 10 MG tablet Take 10 mg by mouth 2 (two) times daily. Take one tablet in the morning and at noon   Yes Historical Provider, MD  metoCLOPramide (REGLAN) 10 MG tablet Take 10 mg by mouth at bedtime.   Yes Historical Provider, MD  modafinil (PROVIGIL) 200 MG tablet Take 200 mg by mouth 2 (two) times daily. Take one tablet in the morning and at noon.   Yes Historical Provider, MD  NALTREXONE HCL PO Take 4.5 mg by mouth daily. Recommended dose is 4.5mg  daily.  Use a 50mg  pill and cut in 1/2 (25mg ).  Cut 1/2 pill into 6 pieces (4.17mg ) or 5 pieces (5mg ).   Yes Historical Provider, MD  naproxen (NAPROSYN) 500 MG tablet Take 500 mg by mouth 2 (two) times daily as needed (pain). Take with food.   Yes Historical Provider, MD  Natalizumab (TYSABRI IV) Inject into the vein every 30 (thirty) days.   Yes Historical Provider, MD  ondansetron (ZOFRAN) 8 MG tablet Take 8 mg by mouth every 8 (eight) hours as needed for nausea.   Yes Historical Provider, MD  oxycodone (OXY-IR) 5 MG capsule Take 10 mg by mouth every 4 (four) hours as needed for pain.   Yes Historical Provider, MD  pantoprazole (PROTONIX) 40 MG tablet Take 40 mg by mouth 2 (two) times daily.   Yes  Historical Provider, MD  potassium chloride (K-DUR,KLOR-CON) 10 MEQ tablet Take 10 mEq by mouth daily.   Yes Historical Provider, MD  ranitidine (ZANTAC) 150 MG tablet Take 150 mg by mouth 2 (two) times daily.   Yes Historical Provider, MD  rOPINIRole (REQUIP) 0.5 MG tablet Take 0.5 mg by mouth at bedtime.   Yes Historical Provider, MD    Physical Exam: Filed Vitals:   04/10/13 1330 04/10/13 1345 04/10/13 1530 04/10/13 1545  BP: 156/100 138/89 132/80 119/68  Pulse: 93 94 89 81  Temp:      TempSrc:      Resp: 13 21 20 17   SpO2: 93% 94% 91% 93%    Physical Exam  Constitutional: Appears well-developed and well-nourished. No distress.  HENT: Normocephalic. External right and left ear normal. Dry MM Eyes: Conjunctivae and EOM are normal. PERRLA, no scleral icterus.  Neck: Normal ROM. Neck supple. No JVD. No tracheal deviation. No thyromegaly.  CVS: RRR, S1/S2 +, no murmurs, no gallops, no carotid bruit.  Pulmonary: Diminished  breath sounds bilaterally with scattered rhonchi  Abdominal: Soft. BS +,  no distension, tenderness, rebound or guarding.  Musculoskeletal: Normal range of motion. No edema and no tenderness.  Lymphadenopathy: No lymphadenopathy noted, cervical, inguinal. Neuro: Alert. Normal reflexes, muscle tone coordination. No cranial nerve deficit. Skin: Skin is warm and dry. No rash noted. Not diaphoretic. No erythema. No pallor.  Psychiatric: Normal mood and affect. Behavior, judgment, thought content normal.   Labs on Admission:  Basic Metabolic Panel:  Recent Labs Lab 04/10/13 1214  NA 136  K 3.7  CL 98  CO2 27  GLUCOSE 134*  BUN 9  CREATININE 1.00  CALCIUM 9.6   CBC:  Recent Labs Lab 04/10/13 1214  WBC 13.0*  HGB 12.5*  HCT 38.9*  MCV 82.9  PLT 215   Radiological Exams on Admission: Dg Chest 2 View  04/10/2013   CLINICAL DATA:  Cough, congestion, shortness of breath  EXAM: CHEST  2 VIEW  COMPARISON:  11/28/2012  FINDINGS: Patchy left lower lobe  consolidation is noted. Superimposed linear scarring or atelectasis is noted. No pleural effusion. Heart size is normal. The right lung is clear. No acute osseous finding.  IMPRESSION: Patchy left lower lobe consolidation which could reflect pneumonia although the presence of linear left lower lobe opacity could also suggest superimposed atelectasis or scarring.   Electronically Signed   By: Christiana Pellant M.D.   On: 04/10/2013 15:01    EKG: Normal sinus rhythm, no ST/T wave changes  Debbora Presto, MD  [facility] Pager [phone]  If 7PM-7AM, please contact night-coverage [email or web address] Password [email or web address] 04/10/2013, 3:59 PM

## 2013-04-10 NOTE — ED Provider Notes (Signed)
CSN: [id number]     Arrival date & time 04/10/13  1154 History   First MD Initiated Contact with Patient 04/10/13 1250     Chief Complaint  Patient presents with  . hx of MS, altered mental status, fever    . aspiration pna x4 this year    (Consider location/radiation/quality/duration/timing/severity/associated sxs/prior Treatment) HPI Comments: The patient present to the ED with a past medical history of MS, and multiple episodes of aspiration pneumonia requiring hospitalization in the ICU, with a chief complaint of fever and altered mental status today.  His wife reports an increase in coughing last night and found him leaning against the wall while sitting on the toilet and reports this is typical of previous episodes of hypoxia in the past. The patient is taking Tecfidera, and reports it decreases his normal temperature to 95 degrees. The patient and his spouse state that they are trying to "catch it early" due to fast decline in the past. The patient states his 22 year old son has a upper respiratory tract infection with a recent fever.   Denies abdominal pain, diarrhea, hematuria, dysuria, or wheezing.   The history is provided by the patient and the spouse.    Past Medical History  Diagnosis Date  . MS (multiple sclerosis)   . Hypertension   . Enlarged prostate   . Acid reflux   . Aspiration pneumonia    Past Surgical History  Procedure Laterality Date  . Hernia repair     History reviewed. No pertinent family history. History  Substance Use Topics  . Smoking status: Former Games developer  . Smokeless tobacco: Never Used  . Alcohol Use: No    Review of Systems  All other systems reviewed and are negative.    Allergies  Adhesive  Home Medications   Current Outpatient Rx  Name  Route  Sig  Dispense  Refill  . albuterol (PROVENTIL HFA;VENTOLIN HFA) 108 (90 BASE) MCG/ACT inhaler   Inhalation   Inhale 2 puffs into the lungs every 6 (six) hours as needed for wheezing or  shortness of breath.         Marland Kitchen atenolol (TENORMIN) 50 MG tablet   Oral   Take 50 mg by mouth every morning.         Marland Kitchen atorvastatin (LIPITOR) 40 MG tablet   Oral   Take 20 mg by mouth at bedtime.         . B Complex Vitamins LIQD   Sublingual   Place under the tongue daily.          . baclofen (LIORESAL) 10 MG tablet   Oral   Take 10 mg by mouth 3 (three) times daily.         . Calcium Carbonate 500 MG CHEW   Oral   Chew 3 tablets by mouth daily.         Marland Kitchen dalfampridine (AMPYRA) 10 MG TB12   Oral   Take 10 mg by mouth 2 (two) times daily.         . Dimethyl Fumarate (TECFIDERA) 240 MG CPDR   Oral   Take 1 capsule by mouth 2 (two) times daily.         Marland Kitchen donepezil (ARICEPT) 10 MG tablet   Oral   Take 10 mg by mouth daily.         Marland Kitchen doxazosin (CARDURA) 4 MG tablet   Oral   Take 2 mg by mouth at bedtime.         Marland Kitchen  dronabinol (MARINOL) 2.5 MG capsule   Oral   Take 5 mg by mouth 3 (three) times daily.         . ergocalciferol (DRISDOL) 8000 UNIT/ML drops   Oral   Take 4,000 Units by mouth daily.          Marland Kitchen gabapentin (NEURONTIN) 300 MG capsule   Oral   Take 600 mg by mouth 4 (four) times daily.         . imiquimod (ALDARA) 5 % cream   Topical   Apply 1 application topically 3 (three) times a week.         . lactobacillus acidophilus (BACID) TABS   Oral   Take 2 tablets by mouth 2 (two) times daily.         . methylphenidate (RITALIN) 10 MG tablet   Oral   Take 10 mg by mouth 2 (two) times daily. Take one tablet in the morning and at noon         . metoCLOPramide (REGLAN) 10 MG tablet   Oral   Take 10 mg by mouth at bedtime.         . modafinil (PROVIGIL) 200 MG tablet   Oral   Take 200 mg by mouth 2 (two) times daily. Take one tablet in the morning and at noon.         Marland Kitchen NALTREXONE HCL PO   Oral   Take 4.5 mg by mouth daily. Recommended dose is 4.5mg  daily.  Use a 50mg  pill and cut in 1/2 (25mg ).  Cut 1/2 pill into 6  pieces (4.17mg ) or 5 pieces (5mg ).         . naproxen (NAPROSYN) 500 MG tablet   Oral   Take 500 mg by mouth 2 (two) times daily as needed (pain). Take with food.         . Natalizumab (TYSABRI IV)   Intravenous   Inject into the vein every 30 (thirty) days.         . ondansetron (ZOFRAN) 8 MG tablet   Oral   Take 8 mg by mouth every 8 (eight) hours as needed for nausea.         Marland Kitchen oxycodone (OXY-IR) 5 MG capsule   Oral   Take 10 mg by mouth every 4 (four) hours as needed for pain.         . pantoprazole (PROTONIX) 40 MG tablet   Oral   Take 40 mg by mouth 2 (two) times daily.         . potassium chloride (K-DUR,KLOR-CON) 10 MEQ tablet   Oral   Take 10 mEq by mouth daily.         . ranitidine (ZANTAC) 150 MG tablet   Oral   Take 150 mg by mouth 2 (two) times daily.         Marland Kitchen rOPINIRole (REQUIP) 0.5 MG tablet   Oral   Take 0.5 mg by mouth at bedtime.         Marland Kitchen levofloxacin (LEVAQUIN) 500 MG tablet   Oral   Take 1.5 tablets (750 mg total) by mouth daily.   10 tablet   0    BP 138/92  Pulse 100  Temp(Src) 99.5 F (37.5 C) (Oral)  Resp 15  SpO2 96% Physical Exam  Nursing note and vitals reviewed. Constitutional: He appears well-developed and well-nourished.  HENT:  Head: Normocephalic and atraumatic.  Neck: Neck supple.  Cardiovascular: Normal rate and regular rhythm.  Pulmonary/Chest: Effort normal and breath sounds normal. He has no wheezes. He exhibits no tenderness.  Abdominal: Soft. Bowel sounds are normal. He exhibits no distension. There is no tenderness. There is no rebound and no guarding.  Musculoskeletal: He exhibits no edema.  Neurological: He is alert.  Skin: Skin is warm and dry. No rash noted.  Psychiatric: He has a normal mood and affect.  Patient occasionally forgetful of words and frequently asks his spouse for help with words.     ED Course  Procedures (including critical care time) Labs Review Labs Reviewed  CBC -  Abnormal; Notable for the following:    WBC 13.0 (*)    Hemoglobin 12.5 (*)    HCT 38.9 (*)    All other components within normal limits  BASIC METABOLIC PANEL - Abnormal; Notable for the following:    Glucose, Bld 134 (*)    GFR calc non Af Amer 87 (*)    All other components within normal limits  URINALYSIS, ROUTINE W REFLEX MICROSCOPIC  CG4 I-STAT (LACTIC ACID)   Imaging Review Dg Chest 2 View  04/10/2013   CLINICAL DATA:  Cough, congestion, shortness of breath  EXAM: CHEST  2 VIEW  COMPARISON:  11/28/2012  FINDINGS: Patchy left lower lobe consolidation is noted. Superimposed linear scarring or atelectasis is noted. No pleural effusion. Heart size is normal. The right lung is clear. No acute osseous finding.  IMPRESSION: Patchy left lower lobe consolidation which could reflect pneumonia although the presence of linear left lower lobe opacity could also suggest superimposed atelectasis or scarring.   Electronically Signed   By: Christiana Pellant M.D.   On: 04/10/2013 15:01    EKG Interpretation   None       MDM   1. Aspiration pneumonia   2. Acute encephalopathy   3. CAP (community acquired pneumonia)     Patient with a past medical history of MS and currently taking Tecfidera and several episodes of aspiration pneumonia presents with a fever of 99.5 and increase in cough.  Will order CBC Chest XR to evaluate for infection.  CXR-shows atelectasis vs pneumonia LL lung field, will start patient on Levaquin IV.  Re-eval: pt Oxygen saturation 92%, pts spouse reports apneic event while patient was sleeping at 85%.  The Apnea alarm was going off during reevaluation.  Discussed patient condition with Dr. Izola Price and she will admit the patient for further monitoring.    Clabe Seal, PA-C 04/10/13 [id number]

## 2013-04-10 NOTE — ED Provider Notes (Signed)
47 year old male with history of multiple sclerosis, presents with a complaint of shortness of breath and fever. This is something that he has recurrently when he gets aspiration pneumonia. On exam the patient has slight rales at the bases bilaterally, no increased work of breathing, normal abdomen was soft nontender exam, clear heart sounds without tachycardia, strong pulses, no edema. His mental status is normal, answers my questions appropriately, does have slight hypoxia to 95-96% on supplemental oxygen. - However when the patient ambulates his oxygen saturations do drop to 92% or below.  Chest x-ray shows that he actually does have a left-sided infiltrate  Medical screening examination/treatment/procedure(s) were conducted as a shared visit with non-physician practitioner(s) and myself.  I personally evaluated the patient during the encounter.  Clinical Impression: Community-acquired pneumonia      Vida Roller, MD 04/14/13 1549

## 2013-04-11 DIAGNOSIS — A419 Sepsis, unspecified organism: Secondary | ICD-10-CM | POA: Diagnosis present

## 2013-04-11 DIAGNOSIS — G35 Multiple sclerosis: Secondary | ICD-10-CM

## 2013-04-11 LAB — EXPECTORATED SPUTUM ASSESSMENT W GRAM STAIN, RFLX TO RESP C

## 2013-04-11 LAB — EXPECTORATED SPUTUM ASSESSMENT W REFEX TO RESP CULTURE

## 2013-04-11 LAB — CBC
Hemoglobin: 11.4 g/dL — ABNORMAL LOW (ref 13.0–17.0)
MCH: 27 pg (ref 26.0–34.0)
MCHC: 32.4 g/dL (ref 30.0–36.0)
MCV: 83.4 fL (ref 78.0–100.0)
Platelets: 185 10*3/uL (ref 150–400)
WBC: 15.3 10*3/uL — ABNORMAL HIGH (ref 4.0–10.5)

## 2013-04-11 LAB — BASIC METABOLIC PANEL
BUN: 10 mg/dL (ref 6–23)
CO2: 28 mEq/L (ref 19–32)
Calcium: 9.8 mg/dL (ref 8.4–10.5)
GFR calc non Af Amer: 86 mL/min — ABNORMAL LOW (ref >90)
Glucose, Bld: 110 mg/dL — ABNORMAL HIGH (ref 70–99)
Potassium: 3.7 mEq/L (ref 3.5–5.1)

## 2013-04-11 LAB — LEGIONELLA ANTIGEN, URINE

## 2013-04-11 LAB — HIV ANTIBODY (ROUTINE TESTING W REFLEX): HIV: NONREACTIVE

## 2013-04-11 NOTE — Progress Notes (Signed)
[facility] PROGRESS NOTE  Randy Greene ZOX:[id number] DOB: 04-24-1966 DOA: 04/10/2013 PCP: [facility]  Assessment/Plan: Sepsis w/ Acute hypoxic respiratory failure - secondary to PNA as noted on CXR and per clinical symptoms  - On empiric ABX for now, oxygen as needed  - antitussive as needed  - IVF and analgesia as needed - sputum analysis ordered, urine legionella and strep penumo  - Still clinically septic (fevers with leukocytosis and tachycardia) HTN  - continue home medical regimen  HLD - continue statis  MS  - stable  - continue home medical regimen   Code Status: Full Family Communication: Pt in room (indicate person spoken with, relationship, and if by phone, the number) Disposition Plan: Pending  Antibiotics:  Rocephin 04/10/13>>>  Azithromycin 04/10/13>>>  HPI/Subjective: Reports feeling better this AM. No acute events noted overnight  Objective: Filed Vitals:   04/10/13 2042 04/11/13 0557 04/11/13 0645 04/11/13 0838  BP: 132/73 128/65 129/71   Pulse: 81 107 101   Temp: 99.7 F (37.6 C) 100.4 F (38 C) 101.2 F (38.4 C) 98.2 F (36.8 C)  TempSrc: Oral Oral Oral Oral  Resp: 18 19 19    Height:      Weight:      SpO2: 95% 91% 95%     Intake/Output Summary (Last 24 hours) at 04/11/13 0932 Last data filed at 04/11/13 0913  Gross per 24 hour  Intake  547.5 ml  Output   1000 ml  Net -452.5 ml   Filed Weights   04/10/13 1900  Weight: 97.7 kg (215 lb 6.2 oz)    Exam:   General:  Awake, in nad  Cardiovascular: regular, s1, s2  Respiratory: normal resp effort, no wheezing  Abdomen: soft, nondistended  Musculoskeletal: perfused, no clubbing   Data Reviewed: Basic Metabolic Panel:  Recent Labs Lab 04/10/13 1214 04/11/13 0446  NA 136 134*  Greene 3.7 3.7  CL 98 97  CO2 27 28  GLUCOSE 134* 110*  BUN 9 10  CREATININE 1.00 1.01  CALCIUM 9.6 9.8   Liver Function Tests: No results found for this basename: AST, ALT, ALKPHOS,  BILITOT, PROT, ALBUMIN,  in the last 168 hours No results found for this basename: LIPASE, AMYLASE,  in the last 168 hours No results found for this basename: AMMONIA,  in the last 168 hours CBC:  Recent Labs Lab 04/10/13 1214 04/11/13 0446  WBC 13.0* 15.3*  HGB 12.5* 11.4*  HCT 38.9* 35.2*  MCV 82.9 83.4  PLT 215 185   Cardiac Enzymes: No results found for this basename: CKTOTAL, CKMB, CKMBINDEX, TROPONINI,  in the last 168 hours BNP (last 3 results) No results found for this basename: PROBNP,  in the last 8760 hours CBG: No results found for this basename: GLUCAP,  in the last 168 hours  No results found for this or any previous visit (from the past 240 hour(s)).   Studies: Dg Chest 2 View  04/10/2013   CLINICAL DATA:  Cough, congestion, shortness of breath  EXAM: CHEST  2 VIEW  COMPARISON:  11/28/2012  FINDINGS: Patchy left lower lobe consolidation is noted. Superimposed linear scarring or atelectasis is noted. No pleural effusion. Heart size is normal. The right lung is clear. No acute osseous finding.  IMPRESSION: Patchy left lower lobe consolidation which could reflect pneumonia although the presence of linear left lower lobe opacity could also suggest superimposed atelectasis or scarring.   Electronically Signed   By: Christiana Pellant M.D.   On: 04/10/2013 15:01  Scheduled Meds: . atenolol  50 mg Oral q morning - 10a  . atorvastatin  20 mg Oral QHS  . azithromycin  500 mg Oral Q24H  . baclofen  10 mg Oral TID  . cefTRIAXone (ROCEPHIN)  IV  1 g Intravenous Q24H  . dalfampridine  10 mg Oral BID  . donepezil  10 mg Oral Daily  . doxazosin  2 mg Oral QHS  . dronabinol  5 mg Oral TID  . enoxaparin (LOVENOX) injection  40 mg Subcutaneous Q24H  . ergocalciferol  4,000 Units Oral Daily  . famotidine  20 mg Oral Daily  . gabapentin  600 mg Oral QID  . lactobacillus acidophilus  2 tablet Oral BID  . methylphenidate  10 mg Oral BID WC  . metoCLOPramide  10 mg Oral QHS  .  modafinil  200 mg Oral BID WC  . pantoprazole  40 mg Oral BID  . potassium chloride  10 mEq Oral Daily  . rOPINIRole  0.5 mg Oral QHS   Continuous Infusions: . sodium chloride 75 mL/hr at 04/10/13 2017    Principal Problem:   CAP (community acquired pneumonia) Active Problems:   Transaminitis   Acute encephalopathy   MS (multiple sclerosis)   Time spent:   Randy Greene  [facility] Pager [phone]. If 7PM-7AM, please contact night-coverage at [email or web address], password [facility] 04/11/2013, 9:32 AM  LOS: 1 day

## 2013-04-12 LAB — BASIC METABOLIC PANEL
BUN: 8 mg/dL (ref 6–23)
CO2: 30 mEq/L (ref 19–32)
Calcium: 9.1 mg/dL (ref 8.4–10.5)
Creatinine, Ser: 0.82 mg/dL (ref 0.50–1.35)
GFR calc Af Amer: >90 mL/min (ref >90)
GFR calc non Af Amer: >90 mL/min (ref >90)
Glucose, Bld: 120 mg/dL — ABNORMAL HIGH (ref 70–99)
Potassium: 4 mEq/L (ref 3.5–5.1)

## 2013-04-12 LAB — CBC WITH DIFFERENTIAL/PLATELET
Basophils Relative: 0 % (ref 0–1)
Eosinophils Absolute: 0.5 10*3/uL (ref 0.0–0.7)
Eosinophils Relative: 5 % (ref 0–5)
Lymphs Abs: 2.3 10*3/uL (ref 0.7–4.0)
MCH: 26.8 pg (ref 26.0–34.0)
MCHC: 31.4 g/dL (ref 30.0–36.0)
MCV: 85.1 fL (ref 78.0–100.0)
Neutrophils Relative %: 68 % (ref 43–77)
Platelets: 166 10*3/uL (ref 150–400)

## 2013-04-12 MED ORDER — LEVOFLOXACIN 750 MG PO TABS: 750.0000 mg | ORAL_TABLET | Freq: Every day | ORAL | Status: DC

## 2013-04-12 NOTE — Discharge Summary (Signed)
Physician Discharge Summary  Randy Greene ZOX:[id number] DOB: 28-Jan-1966 DOA: 04/10/2013  PCP: [facility]  Admit date: 04/10/2013 Discharge date: 04/12/2013  Time spent: 45 minutes  Recommendations for Outpatient Follow-up:  -Advised to follow up with PCP in 2 weeks. -would recommend repeat CXR in 4-6 weeks.   Discharge Diagnoses:  Principal Problem:   Sepsis Active Problems:   CAP (community acquired pneumonia)   Transaminitis   Acute encephalopathy   MS (multiple sclerosis)   Discharge Condition: Stable and improved.  Filed Weights   04/10/13 1900  Weight: 97.7 kg (215 lb 6.2 oz)    History of present illness:  Pt is 47 yo male with MS who presents to [facility] ED with main concern of progressively worsening shortness of breath that started several days prior to this admission, associated with subjective fevers, chills, poor oral intake, productive cough of yellow sputum. Pt denies any specific alleviating or aggravating factors, no recent sick contacts or exposures, no recent sicknesses. He denies abdominal or urinary concerns.  In ED, pt stable but oxygen saturation dropped to 80% but quickly improved with oxygen via [place]. TRH asked to admit for management of PNA.   Hospital Course:   Sepsis w/ Acute hypoxic respiratory failure  - secondary to PNA as noted on CXR and per clinical symptoms  -Fever/leukocytosis/tachycardia resolved. -Patient asking to go home today. -Will transition to PO levaquin for 7 more days. -Has no oxygen requirements.  HTN  - continue home medical regimen   HLD - continue statin.  MS  - stable  - continue home medical regimen    Procedures:  None   Consultations:  None  Discharge Instructions  Discharge Orders   Future Orders Complete By Expires   Diet - low sodium heart healthy  As directed    Discontinue IV  As directed    Increase activity slowly  As directed        Medication List         albuterol 108 (90 BASE) MCG/ACT  inhaler  Commonly known as:  PROVENTIL HFA;VENTOLIN HFA  Inhale 2 puffs into the lungs every 6 (six) hours as needed for wheezing or shortness of breath.     AMPYRA 10 MG Tb12  Generic drug:  dalfampridine  Take 10 mg by mouth 2 (two) times daily.     atenolol 50 MG tablet  Commonly known as:  TENORMIN  Take 50 mg by mouth every morning.     atorvastatin 40 MG tablet  Commonly known as:  LIPITOR  Take 20 mg by mouth at bedtime.     B Complex Vitamins Liqd  Place under the tongue daily.     baclofen 10 MG tablet  Commonly known as:  LIORESAL  Take 10 mg by mouth 3 (three) times daily.     Calcium Carbonate 500 MG Chew  Chew 3 tablets by mouth daily.     donepezil 10 MG tablet  Commonly known as:  ARICEPT  Take 10 mg by mouth daily.     doxazosin 4 MG tablet  Commonly known as:  CARDURA  Take 2 mg by mouth at bedtime.     dronabinol 2.5 MG capsule  Commonly known as:  MARINOL  Take 5 mg by mouth 3 (three) times daily.     ergocalciferol 8000 UNIT/ML drops  Commonly known as:  DRISDOL  Take 4,000 Units by mouth daily.     gabapentin 300 MG capsule  Commonly known as:  NEURONTIN  Take  600 mg by mouth 4 (four) times daily.     imiquimod 5 % cream  Commonly known as:  ALDARA  Apply 1 application topically 3 (three) times a week.     lactobacillus acidophilus Tabs tablet  Take 2 tablets by mouth 2 (two) times daily.     levofloxacin 750 MG tablet  Commonly known as:  LEVAQUIN  Take 1 tablet (750 mg total) by mouth daily.     methylphenidate 10 MG tablet  Commonly known as:  RITALIN  Take 10 mg by mouth 2 (two) times daily. Take one tablet in the morning and at noon     metoCLOPramide 10 MG tablet  Commonly known as:  REGLAN  Take 10 mg by mouth at bedtime.     modafinil 200 MG tablet  Commonly known as:  PROVIGIL  Take 200 mg by mouth 2 (two) times daily. Take one tablet in the morning and at noon.     NALTREXONE HCL PO  Take 4.5 mg by mouth daily.  Recommended dose is 4.5mg  daily.  Use a 50mg  pill and cut in 1/2 (25mg ).  Cut 1/2 pill into 6 pieces (4.17mg ) or 5 pieces (5mg ).     naproxen 500 MG tablet  Commonly known as:  NAPROSYN  Take 500 mg by mouth 2 (two) times daily as needed (pain). Take with food.     ondansetron 8 MG tablet  Commonly known as:  ZOFRAN  Take 8 mg by mouth every 8 (eight) hours as needed for nausea.     oxycodone 5 MG capsule  Commonly known as:  OXY-IR  Take 10 mg by mouth every 4 (four) hours as needed for pain.     pantoprazole 40 MG tablet  Commonly known as:  PROTONIX  Take 40 mg by mouth 2 (two) times daily.     potassium chloride 10 MEQ tablet  Commonly known as:  K-DUR,KLOR-CON  Take 10 mEq by mouth daily.     ranitidine 150 MG tablet  Commonly known as:  ZANTAC  Take 150 mg by mouth 2 (two) times daily.     rOPINIRole 0.5 MG tablet  Commonly known as:  REQUIP  Take 0.5 mg by mouth at bedtime.     TYSABRI IV  Inject into the vein every 30 (thirty) days.       Allergies  Allergen Reactions  . Adhesive [Tape] Rash       Follow-up Information   Follow up with [facility]. Schedule an appointment as soon as possible for a visit in 2 weeks.   Specialty:  General Practice   Contact information:   [address] [place] [address] [phone]        The results of significant diagnostics from this hospitalization (including imaging, microbiology, ancillary and laboratory) are listed below for reference.    Significant Diagnostic Studies: Dg Chest 2 View  04/10/2013   CLINICAL DATA:  Cough, congestion, shortness of breath  EXAM: CHEST  2 VIEW  COMPARISON:  11/28/2012  FINDINGS: Patchy left lower lobe consolidation is noted. Superimposed linear scarring or atelectasis is noted. No pleural effusion. Heart size is normal. The right lung is clear. No acute osseous finding.  IMPRESSION: Patchy left lower lobe consolidation which could reflect pneumonia although the presence of  linear left lower lobe opacity could also suggest superimposed atelectasis or scarring.   Electronically Signed   By: Christiana Pellant M.D.   On: 04/10/2013 15:01    Microbiology: Recent Results (from  the past 240 hour(s))  CULTURE, BLOOD (ROUTINE X 2)     Status: None   Collection Time    04/10/13  7:47 PM      Result Value Range Status   Specimen Description BLOOD LEFT ARM   Final   Special Requests BOTTLES DRAWN AEROBIC AND ANAEROBIC 4CC   Final   Culture  Setup Time     Final   Value: 04/11/2013 00:38     Performed at [facility]   Culture     Final   Value:        BLOOD CULTURE RECEIVED NO GROWTH TO DATE CULTURE WILL BE HELD FOR 5 DAYS BEFORE ISSUING A FINAL NEGATIVE REPORT     Performed at [facility]   Report Status PENDING   Incomplete  CULTURE, BLOOD (ROUTINE X 2)     Status: None   Collection Time    04/10/13  7:57 PM      Result Value Range Status   Specimen Description BLOOD LEFT HAND   Final   Special Requests BOTTLES DRAWN AEROBIC ONLY 3CC   Final   Culture  Setup Time     Final   Value: 04/11/2013 00:39     Performed at [facility]   Culture     Final   Value:        BLOOD CULTURE RECEIVED NO GROWTH TO DATE CULTURE WILL BE HELD FOR 5 DAYS BEFORE ISSUING A FINAL NEGATIVE REPORT     Performed at [facility]   Report Status PENDING   Incomplete  CULTURE, EXPECTORATED SPUTUM-ASSESSMENT     Status: None   Collection Time    04/11/13  5:20 PM      Result Value Range Status   Specimen Description SPUTUM   Final   Special Requests NONE   Final   Sputum evaluation     Final   Value: THIS SPECIMEN IS ACCEPTABLE. RESPIRATORY CULTURE REPORT TO FOLLOW.   Report Status 04/11/2013 FINAL   Final  CULTURE, RESPIRATORY (NON-EXPECTORATED)     Status: None   Collection Time    04/11/13  5:20 PM      Result Value Range Status   Specimen Description SPUTUM   Final   Special Requests NONE   Final   Gram Stain     Final   Value: FEW WBC  PRESENT, PREDOMINANTLY PMN     FEW SQUAMOUS EPITHELIAL CELLS PRESENT     FEW GRAM POSITIVE COCCI     IN PAIRS IN CHAINS IN CLUSTERS FEW GRAM POSITIVE RODS     Performed at [facility]   Culture PENDING   Incomplete   Report Status PENDING   Incomplete     Labs: Basic Metabolic Panel:  Recent Labs Lab 04/10/13 1214 04/11/13 0446 04/12/13 0506  NA 136 134* 135  K 3.7 3.7 4.0  CL 98 97 102  CO2 27 28 30   GLUCOSE 134* 110* 120*  BUN 9 10 8   CREATININE 1.00 1.01 0.82  CALCIUM 9.6 9.8 9.1   Liver Function Tests: No results found for this basename: AST, ALT, ALKPHOS, BILITOT, PROT, ALBUMIN,  in the last 168 hours No results found for this basename: LIPASE, AMYLASE,  in the last 168 hours No results found for this basename: AMMONIA,  in the last 168 hours CBC:  Recent Labs Lab 04/10/13 1214 04/11/13 0446 04/12/13 0506  WBC 13.0* 15.3* 11.5*  NEUTROABS  --   --  7.8*  HGB 12.5* 11.4* 9.9*  HCT 38.9* 35.2* 31.5*  MCV 82.9 83.4 85.1  PLT 215 185 166   Cardiac Enzymes: No results found for this basename: CKTOTAL, CKMB, CKMBINDEX, TROPONINI,  in the last 168 hours BNP: BNP (last 3 results) No results found for this basename: PROBNP,  in the last 8760 hours CBG: No results found for this basename: GLUCAP,  in the last 168 hours     Signed:  Chaya Jan  [facility] Pager: [phone] 04/12/2013, 9:51 AM

## 2013-04-14 LAB — CULTURE, RESPIRATORY W GRAM STAIN: Culture: NORMAL

## 2013-04-14 NOTE — ED Provider Notes (Signed)
Medical screening examination/treatment/procedure(s) were conducted as a shared visit with non-physician practitioner(s) and myself.  I personally evaluated the patient during the encounter  Please see my separate respective documentation pertaining to this patient encounter   Vida Roller, MD 04/14/13 1549

## 2013-04-17 LAB — CULTURE, BLOOD (ROUTINE X 2): Culture: NO GROWTH

## 2013-04-27 ENCOUNTER — Other Ambulatory Visit: Payer: Self-pay

## 2013-07-17 DIAGNOSIS — K219 Gastro-esophageal reflux disease without esophagitis: Secondary | ICD-10-CM | POA: Diagnosis present

## 2014-01-01 ENCOUNTER — Encounter (HOSPITAL_COMMUNITY): Payer: Self-pay | Admitting: Emergency Medicine

## 2014-01-01 ENCOUNTER — Emergency Department (HOSPITAL_COMMUNITY): Payer: Medicare Other

## 2014-01-01 ENCOUNTER — Observation Stay (HOSPITAL_COMMUNITY)
Admission: EM | Admit: 2014-01-01 | Discharge: 2014-01-02 | Disposition: A | Discharge status: Home / Self Care | Payer: Medicare Other | Attending: Internal Medicine | Admitting: Internal Medicine

## 2014-01-01 DIAGNOSIS — G4733 Obstructive sleep apnea (adult) (pediatric): Secondary | ICD-10-CM | POA: Diagnosis present

## 2014-01-01 DIAGNOSIS — R0902 Hypoxemia: Secondary | ICD-10-CM

## 2014-01-01 DIAGNOSIS — J9610 Chronic respiratory failure, unspecified whether with hypoxia or hypercapnia: Secondary | ICD-10-CM | POA: Diagnosis not present

## 2014-01-01 DIAGNOSIS — J9611 Chronic respiratory failure with hypoxia: Secondary | ICD-10-CM | POA: Diagnosis present

## 2014-01-01 DIAGNOSIS — I10 Essential (primary) hypertension: Secondary | ICD-10-CM | POA: Insufficient documentation

## 2014-01-01 DIAGNOSIS — N400 Enlarged prostate without lower urinary tract symptoms: Secondary | ICD-10-CM | POA: Insufficient documentation

## 2014-01-01 DIAGNOSIS — J9612 Chronic respiratory failure with hypercapnia: Secondary | ICD-10-CM | POA: Diagnosis present

## 2014-01-01 DIAGNOSIS — R0689 Other abnormalities of breathing: Secondary | ICD-10-CM

## 2014-01-01 DIAGNOSIS — G35D Multiple sclerosis, unspecified: Secondary | ICD-10-CM

## 2014-01-01 DIAGNOSIS — E874 Mixed disorder of acid-base balance: Secondary | ICD-10-CM | POA: Diagnosis not present

## 2014-01-01 DIAGNOSIS — E785 Hyperlipidemia, unspecified: Secondary | ICD-10-CM | POA: Diagnosis not present

## 2014-01-01 DIAGNOSIS — K449 Diaphragmatic hernia without obstruction or gangrene: Secondary | ICD-10-CM | POA: Diagnosis not present

## 2014-01-01 DIAGNOSIS — Z87891 Personal history of nicotine dependence: Secondary | ICD-10-CM | POA: Insufficient documentation

## 2014-01-01 DIAGNOSIS — G35 Multiple sclerosis: Secondary | ICD-10-CM | POA: Diagnosis not present

## 2014-01-01 DIAGNOSIS — K219 Gastro-esophageal reflux disease without esophagitis: Secondary | ICD-10-CM | POA: Diagnosis not present

## 2014-01-01 DIAGNOSIS — J471 Bronchiectasis with (acute) exacerbation: Secondary | ICD-10-CM

## 2014-01-01 DIAGNOSIS — Z9989 Dependence on other enabling machines and devices: Secondary | ICD-10-CM

## 2014-01-01 HISTORY — DX: Chronic respiratory failure with hypercapnia: J96.12

## 2014-01-01 LAB — URINALYSIS, ROUTINE W REFLEX MICROSCOPIC
BILIRUBIN URINE: NEGATIVE
Glucose, UA: NEGATIVE mg/dL
Hgb urine dipstick: NEGATIVE
Ketones, ur: NEGATIVE mg/dL
Leukocytes, UA: NEGATIVE
Nitrite: NEGATIVE
Protein, ur: NEGATIVE mg/dL
Specific Gravity, Urine: 1.02 (ref 1.005–1.030)
UROBILINOGEN UA: 0.2 mg/dL (ref 0.0–1.0)
pH: 7 (ref 5.0–8.0)

## 2014-01-01 LAB — I-STAT ARTERIAL BLOOD GAS, ED
ACID-BASE EXCESS: 8 mmol/L — AB (ref 0.0–2.0)
Bicarbonate: 34.8 mEq/L — ABNORMAL HIGH (ref 20.0–24.0)
O2 Saturation: 89 %
PH ART: 7.375 (ref 7.350–7.450)
PO2 ART: 60 mmHg — AB (ref 80.0–100.0)
Patient temperature: 98.7
TCO2: 37 mmol/L (ref 0–100)
pCO2 arterial: 59.5 mmHg (ref 35.0–45.0)

## 2014-01-01 LAB — CBC WITH DIFFERENTIAL/PLATELET
BASOS PCT: 0 % (ref 0–1)
Basophils Absolute: 0 10*3/uL (ref 0.0–0.1)
Eosinophils Absolute: 1.1 10*3/uL — ABNORMAL HIGH (ref 0.0–0.7)
Eosinophils Relative: 15 % — ABNORMAL HIGH (ref 0–5)
HEMATOCRIT: 34.1 % — AB (ref 39.0–52.0)
HEMOGLOBIN: 11.2 g/dL — AB (ref 13.0–17.0)
Lymphocytes Relative: 28 % (ref 12–46)
Lymphs Abs: 2 10*3/uL (ref 0.7–4.0)
MCH: 31.5 pg (ref 26.0–34.0)
MCHC: 32.8 g/dL (ref 30.0–36.0)
MCV: 95.8 fL (ref 78.0–100.0)
MONO ABS: 1 10*3/uL (ref 0.1–1.0)
MONOS PCT: 13 % — AB (ref 3–12)
NEUTROS ABS: 3 10*3/uL (ref 1.7–7.7)
Neutrophils Relative %: 44 % (ref 43–77)
Platelets: 151 10*3/uL (ref 150–400)
RBC: 3.56 MIL/uL — ABNORMAL LOW (ref 4.22–5.81)
RDW: 14.9 % (ref 11.5–15.5)
WBC: 7.1 10*3/uL (ref 4.0–10.5)

## 2014-01-01 LAB — RAPID URINE DRUG SCREEN, HOSP PERFORMED
Amphetamines: NOT DETECTED
BARBITURATES: NOT DETECTED
Benzodiazepines: NOT DETECTED
COCAINE: NOT DETECTED
Opiates: NOT DETECTED
Tetrahydrocannabinol: POSITIVE — AB

## 2014-01-01 LAB — COMPREHENSIVE METABOLIC PANEL
ALBUMIN: 3.8 g/dL (ref 3.5–5.2)
ALT: 31 U/L (ref 0–53)
AST: 28 U/L (ref 0–37)
Alkaline Phosphatase: 69 U/L (ref 39–117)
Anion gap: 10 (ref 5–15)
BUN: 9 mg/dL (ref 6–23)
CHLORIDE: 97 meq/L (ref 96–112)
CO2: 33 mEq/L — ABNORMAL HIGH (ref 19–32)
CREATININE: 0.75 mg/dL (ref 0.50–1.35)
Calcium: 9.4 mg/dL (ref 8.4–10.5)
GFR calc non Af Amer: >90 mL/min (ref >90)
GLUCOSE: 107 mg/dL — AB (ref 70–99)
Potassium: 3.9 mEq/L (ref 3.7–5.3)
Sodium: 140 mEq/L (ref 137–147)
Total Bilirubin: 0.4 mg/dL (ref 0.3–1.2)
Total Protein: 7 g/dL (ref 6.0–8.3)

## 2014-01-01 LAB — TROPONIN I

## 2014-01-01 MED ORDER — IMIQUIMOD 5 % EX CREA
1.0000 "application " | TOPICAL_CREAM | CUTANEOUS | Status: DC
  Filled 2014-01-01 (×4): qty 1

## 2014-01-01 MED ORDER — ERGOCALCIFEROL 8000 UNIT/ML PO SOLN
4000.0000 [IU] | Freq: Every day | ORAL | Status: DC
  Administered 2014-01-02: 4000 [IU] via ORAL
  Filled 2014-01-01: qty 0.5

## 2014-01-01 MED ORDER — ADULT MULTIVITAMIN W/MINERALS CH
1.0000 | ORAL_TABLET | Freq: Every day | ORAL | Status: DC
  Administered 2014-01-01 – 2014-01-02 (×2): 1 via ORAL
  Filled 2014-01-01 (×2): qty 1

## 2014-01-01 MED ORDER — IPRATROPIUM-ALBUTEROL 0.5-2.5 (3) MG/3ML IN SOLN
3.0000 mL | RESPIRATORY_TRACT | Status: DC
  Administered 2014-01-01 – 2014-01-02 (×4): 3 mL via RESPIRATORY_TRACT
  Filled 2014-01-01 (×4): qty 3

## 2014-01-01 MED ORDER — DICLOFENAC SODIUM 1 % TD GEL: 2.0000 g | Freq: Two times a day (BID) | TRANSDERMAL | Status: DC | PRN

## 2014-01-01 MED ORDER — DRONABINOL 2.5 MG PO CAPS
2.5000 mg | ORAL_CAPSULE | Freq: Four times a day (QID) | ORAL | Status: DC
  Administered 2014-01-01 – 2014-01-02 (×3): 2.5 mg via ORAL
  Filled 2014-01-01 (×3): qty 1

## 2014-01-01 MED ORDER — ACETAMINOPHEN 650 MG RE SUPP: 650.0000 mg | Freq: Four times a day (QID) | RECTAL | Status: DC | PRN

## 2014-01-01 MED ORDER — ROPINIROLE HCL 0.5 MG PO TABS
0.5000 mg | ORAL_TABLET | Freq: Every day | ORAL | Status: DC
  Administered 2014-01-01: 0.5 mg via ORAL
  Filled 2014-01-01 (×2): qty 1

## 2014-01-01 MED ORDER — DONEPEZIL HCL 10 MG PO TABS
10.0000 mg | ORAL_TABLET | Freq: Every day | ORAL | Status: DC
  Administered 2014-01-01: 10 mg via ORAL
  Filled 2014-01-01 (×2): qty 1

## 2014-01-01 MED ORDER — CALCIUM CARBONATE ANTACID 500 MG PO CHEW
3.0000 | CHEWABLE_TABLET | Freq: Two times a day (BID) | ORAL | Status: DC | PRN
  Filled 2014-01-01: qty 3

## 2014-01-01 MED ORDER — IPRATROPIUM-ALBUTEROL 0.5-2.5 (3) MG/3ML IN SOLN: 3.0000 mL | RESPIRATORY_TRACT | Status: DC | PRN

## 2014-01-01 MED ORDER — ATORVASTATIN CALCIUM 20 MG PO TABS
20.0000 mg | ORAL_TABLET | Freq: Every day | ORAL | Status: DC
  Administered 2014-01-01: 20 mg via ORAL
  Filled 2014-01-01 (×2): qty 1

## 2014-01-01 MED ORDER — BACLOFEN 10 MG PO TABS
10.0000 mg | ORAL_TABLET | Freq: Every day | ORAL | Status: DC
  Administered 2014-01-01: 10 mg via ORAL
  Filled 2014-01-01 (×2): qty 1

## 2014-01-01 MED ORDER — ONDANSETRON HCL 4 MG PO TABS: 8.0000 mg | ORAL_TABLET | Freq: Three times a day (TID) | ORAL | Status: DC | PRN

## 2014-01-01 MED ORDER — DOXAZOSIN MESYLATE 2 MG PO TABS
2.0000 mg | ORAL_TABLET | Freq: Every day | ORAL | Status: DC
  Administered 2014-01-01: 2 mg via ORAL
  Filled 2014-01-01 (×2): qty 1

## 2014-01-01 MED ORDER — BACLOFEN 5 MG HALF TABLET
5.0000 mg | ORAL_TABLET | Freq: Two times a day (BID) | ORAL | Status: DC
  Administered 2014-01-01 – 2014-01-02 (×2): 5 mg via ORAL
  Filled 2014-01-01 (×4): qty 1

## 2014-01-01 MED ORDER — DALFAMPRIDINE ER 10 MG PO TB12
10.0000 mg | ORAL_TABLET | Freq: Two times a day (BID) | ORAL | Status: DC
  Filled 2014-01-01: qty 1

## 2014-01-01 MED ORDER — METOCLOPRAMIDE HCL 10 MG PO TABS
10.0000 mg | ORAL_TABLET | Freq: Every day | ORAL | Status: DC
  Administered 2014-01-01: 10 mg via ORAL
  Filled 2014-01-01 (×2): qty 1

## 2014-01-01 MED ORDER — MODAFINIL 100 MG PO TABS
200.0000 mg | ORAL_TABLET | Freq: Two times a day (BID) | ORAL | Status: DC
  Administered 2014-01-02 (×2): 200 mg via ORAL
  Filled 2014-01-01 (×2): qty 2

## 2014-01-01 MED ORDER — GABAPENTIN 400 MG PO CAPS
400.0000 mg | ORAL_CAPSULE | Freq: Four times a day (QID) | ORAL | Status: DC
  Administered 2014-01-01 – 2014-01-02 (×3): 400 mg via ORAL
  Filled 2014-01-01 (×6): qty 1

## 2014-01-01 MED ORDER — BACLOFEN 5 MG HALF TABLET: 5.0000 mg | ORAL_TABLET | Freq: Three times a day (TID) | ORAL | Status: DC

## 2014-01-01 MED ORDER — METHYLPHENIDATE HCL 5 MG PO TABS
10.0000 mg | ORAL_TABLET | Freq: Two times a day (BID) | ORAL | Status: DC
  Administered 2014-01-02 (×2): 10 mg via ORAL
  Filled 2014-01-01 (×2): qty 2

## 2014-01-01 MED ORDER — LIDOCAINE 5 % EX PTCH: 1.0000 | MEDICATED_PATCH | Freq: Every day | CUTANEOUS | Status: DC | PRN

## 2014-01-01 MED ORDER — METHYLPHENIDATE HCL 10 MG PO TABS: 10.0000 mg | ORAL_TABLET | Freq: Two times a day (BID) | ORAL | Status: DC

## 2014-01-01 MED ORDER — FAMOTIDINE 20 MG PO TABS
20.0000 mg | ORAL_TABLET | Freq: Two times a day (BID) | ORAL | Status: DC
  Administered 2014-01-01 – 2014-01-02 (×2): 20 mg via ORAL
  Filled 2014-01-01 (×4): qty 1

## 2014-01-01 MED ORDER — CALCIUM CARBONATE 500 MG PO CHEW: 3.0000 | CHEWABLE_TABLET | Freq: Two times a day (BID) | ORAL | Status: DC | PRN

## 2014-01-01 MED ORDER — ALPRAZOLAM 0.25 MG PO TABS: 0.5000 mg | ORAL_TABLET | Freq: Every evening | ORAL | Status: DC | PRN

## 2014-01-01 MED ORDER — NAPROXEN 500 MG PO TABS
500.0000 mg | ORAL_TABLET | Freq: Two times a day (BID) | ORAL | Status: DC | PRN
  Filled 2014-01-01: qty 1

## 2014-01-01 MED ORDER — ENOXAPARIN SODIUM 40 MG/0.4ML ~~LOC~~ SOLN
40.0000 mg | SUBCUTANEOUS | Status: DC
  Administered 2014-01-01: 40 mg via SUBCUTANEOUS
  Filled 2014-01-01 (×2): qty 0.4

## 2014-01-01 MED ORDER — OXYCODONE HCL 5 MG PO TABS
10.0000 mg | ORAL_TABLET | Freq: Four times a day (QID) | ORAL | Status: DC | PRN
  Administered 2014-01-01: 10 mg via ORAL
  Filled 2014-01-01 (×2): qty 2

## 2014-01-01 MED ORDER — ATENOLOL 50 MG PO TABS
50.0000 mg | ORAL_TABLET | Freq: Every morning | ORAL | Status: DC
  Administered 2014-01-02: 50 mg via ORAL
  Filled 2014-01-01: qty 1

## 2014-01-01 MED ORDER — SODIUM CHLORIDE 0.9 % IJ SOLN
3.0000 mL | Freq: Two times a day (BID) | INTRAMUSCULAR | Status: DC
Start: 2014-01-01 — End: 2014-01-02
  Administered 2014-01-01 (×2): 3 mL via INTRAVENOUS

## 2014-01-01 MED ORDER — ACETAMINOPHEN 325 MG PO TABS: 650.0000 mg | ORAL_TABLET | Freq: Four times a day (QID) | ORAL | Status: DC | PRN

## 2014-01-01 MED ORDER — PANTOPRAZOLE SODIUM 40 MG PO TBEC
40.0000 mg | DELAYED_RELEASE_TABLET | Freq: Two times a day (BID) | ORAL | Status: DC
  Administered 2014-01-01 – 2014-01-02 (×3): 40 mg via ORAL
  Filled 2014-01-01 (×3): qty 1

## 2014-01-01 MED ORDER — BIOTENE DRY MOUTH MT LIQD
15.0000 mL | Freq: Two times a day (BID) | OROMUCOSAL | Status: DC
  Administered 2014-01-02: 15 mL via OROMUCOSAL

## 2014-01-01 MED ORDER — IPRATROPIUM-ALBUTEROL 0.5-2.5 (3) MG/3ML IN SOLN
3.0000 mL | Freq: Once | RESPIRATORY_TRACT | Status: AC
  Administered 2014-01-01: 3 mL via RESPIRATORY_TRACT
  Filled 2014-01-01: qty 3

## 2014-01-01 MED ORDER — MODAFINIL 200 MG PO TABS: 200.0000 mg | ORAL_TABLET | Freq: Two times a day (BID) | ORAL | Status: DC

## 2014-01-01 NOTE — ED Notes (Signed)
Pt c/o last week had aspiration pneumonia and was given medications for it. Pt reports today his portable O2 monitor read 80%. Pt of [facility] and PMD in [place]. Pt called both places and was told to come to nearest ED.

## 2014-01-01 NOTE — Progress Notes (Signed)
Pt admitted to the unit at 1515. Pt mental status is A&Ox4. Pt oriented to room, staff, and call bell. Skin is intact. Full assessment charted in [facility]. Call bell within reach. Visitor guidelines reviewed w/ pt and/or family.

## 2014-01-01 NOTE — Progress Notes (Signed)
Notified Respiratory Therapist Saralyn Pilar Patient is for CPAP tonight

## 2014-01-01 NOTE — ED Provider Notes (Signed)
CSN: [id number]     Arrival date & time 01/01/14  5277 History   First MD Initiated Contact with Patient 01/01/14 [phone]     Chief Complaint  Patient presents with  . Shortness of Breath     (Consider location/radiation/quality/duration/timing/severity/associated sxs/prior Treatment) Patient is a 48 y.o. male presenting with shortness of breath and cough. The history is provided by the patient.  Shortness of Breath Severity:  Mild Onset quality:  Gradual Duration:  6 days Timing:  Constant Progression:  Worsening Chronicity:  Recurrent Context: URI   Relieved by:  Nothing Worsened by:  Nothing tried Ineffective treatments: abx. Associated symptoms: cough, sputum production, vomiting and wheezing   Associated symptoms: no abdominal pain, no chest pain, no fever, no headaches and no neck pain   Cough Cough characteristics:  Productive Sputum characteristics:  Pearline Cables Severity:  Mild Onset quality:  Gradual Duration:  6 days Timing:  Constant Progression:  Worsening Chronicity:  New Smoker: no   Context: upper respiratory infection   Relieved by:  Nothing Worsened by:  Nothing tried Ineffective treatments: abx, breathing tx's. Associated symptoms: shortness of breath and wheezing   Associated symptoms: no chest pain, no fever, no headaches and no rhinorrhea     Past Medical History  Diagnosis Date  . MS (multiple sclerosis)   . Hypertension   . Enlarged prostate   . Acid reflux   . Aspiration pneumonia    Past Surgical History  Procedure Laterality Date  . Hernia repair     No family history on file. History  Substance Use Topics  . Smoking status: Former Research scientist (life sciences)  . Smokeless tobacco: Never Used  . Alcohol Use: No    Review of Systems  Constitutional: Negative for fever.  HENT: Negative for drooling and rhinorrhea.   Eyes: Negative for pain.  Respiratory: Positive for cough, sputum production, shortness of breath and wheezing.   Cardiovascular: Negative for  chest pain and leg swelling.  Gastrointestinal: Positive for nausea, vomiting and diarrhea. Negative for abdominal pain.  Genitourinary: Negative for dysuria and hematuria.  Musculoskeletal: Negative for gait problem and neck pain.  Skin: Negative for color change.  Neurological: Negative for numbness and headaches.  Hematological: Negative for adenopathy.  Psychiatric/Behavioral: Negative for behavioral problems.  All other systems reviewed and are negative.     Allergies  Adhesive  Home Medications   Prior to Admission medications   Medication Sig Start Date End Date Taking? Authorizing Provider  albuterol (PROVENTIL HFA;VENTOLIN HFA) 108 (90 BASE) MCG/ACT inhaler Inhale 2 puffs into the lungs every 6 (six) hours as needed for wheezing or shortness of breath.    Historical Provider, MD  atenolol (TENORMIN) 50 MG tablet Take 50 mg by mouth every morning.    Historical Provider, MD  atorvastatin (LIPITOR) 40 MG tablet Take 20 mg by mouth at bedtime.    Historical Provider, MD  B Complex Vitamins LIQD Place under the tongue daily.     Historical Provider, MD  baclofen (LIORESAL) 10 MG tablet Take 10 mg by mouth 3 (three) times daily.    Historical Provider, MD  Calcium Carbonate 500 MG CHEW Chew 3 tablets by mouth daily.    Historical Provider, MD  dalfampridine (AMPYRA) 10 MG TB12 Take 10 mg by mouth 2 (two) times daily.    Historical Provider, MD  donepezil (ARICEPT) 10 MG tablet Take 10 mg by mouth daily.    Historical Provider, MD  doxazosin (CARDURA) 4 MG tablet Take 2 mg by mouth  at bedtime.    Historical Provider, MD  dronabinol (MARINOL) 2.5 MG capsule Take 5 mg by mouth 3 (three) times daily.    Historical Provider, MD  ergocalciferol (DRISDOL) 8000 UNIT/ML drops Take 4,000 Units by mouth daily.     Historical Provider, MD  gabapentin (NEURONTIN) 300 MG capsule Take 600 mg by mouth 4 (four) times daily.    Historical Provider, MD  imiquimod (ALDARA) 5 % cream Apply 1  application topically 3 (three) times a week.    Historical Provider, MD  lactobacillus acidophilus (BACID) TABS Take 2 tablets by mouth 2 (two) times daily.    Historical Provider, MD  levofloxacin (LEVAQUIN) 750 MG tablet Take 1 tablet (750 mg total) by mouth daily. 04/12/13   Erline Hau, MD  methylphenidate (RITALIN) 10 MG tablet Take 10 mg by mouth 2 (two) times daily. Take one tablet in the morning and at noon    Historical Provider, MD  metoCLOPramide (REGLAN) 10 MG tablet Take 10 mg by mouth at bedtime.    Historical Provider, MD  modafinil (PROVIGIL) 200 MG tablet Take 200 mg by mouth 2 (two) times daily. Take one tablet in the morning and at noon.    Historical Provider, MD  NALTREXONE HCL PO Take 4.5 mg by mouth daily. Recommended dose is 4.5mg  daily.  Use a 50mg  pill and cut in 1/2 (25mg ).  Cut 1/2 pill into 6 pieces (4.17mg ) or 5 pieces (5mg ).    Historical Provider, MD  naproxen (NAPROSYN) 500 MG tablet Take 500 mg by mouth 2 (two) times daily as needed (pain). Take with food.    Historical Provider, MD  Natalizumab (TYSABRI IV) Inject into the vein every 30 (thirty) days.    Historical Provider, MD  ondansetron (ZOFRAN) 8 MG tablet Take 8 mg by mouth every 8 (eight) hours as needed for nausea.    Historical Provider, MD  oxycodone (OXY-IR) 5 MG capsule Take 10 mg by mouth every 4 (four) hours as needed for pain.    Historical Provider, MD  pantoprazole (PROTONIX) 40 MG tablet Take 40 mg by mouth 2 (two) times daily.    Historical Provider, MD  potassium chloride (K-DUR,KLOR-CON) 10 MEQ tablet Take 10 mEq by mouth daily.    Historical Provider, MD  ranitidine (ZANTAC) 150 MG tablet Take 150 mg by mouth 2 (two) times daily.    Historical Provider, MD  rOPINIRole (REQUIP) 0.5 MG tablet Take 0.5 mg by mouth at bedtime.    Historical Provider, MD   BP 144/78  Pulse 98  Temp(Src) 98.2 F (36.8 C)  Resp 20  Ht 6\' 2"  (1.88 m)  Wt 225 lb (102.059 kg)  BMI 28.88 kg/m2  SpO2  96% Physical Exam  Nursing note and vitals reviewed. Constitutional: He is oriented to person, place, and time. He appears well-developed and well-nourished.  HENT:  Head: Normocephalic and atraumatic.  Right Ear: External ear normal.  Left Ear: External ear normal.  Nose: Nose normal.  Mouth/Throat: Oropharynx is clear and moist. No oropharyngeal exudate.  Eyes: Conjunctivae and EOM are normal. Pupils are equal, round, and reactive to light.  Neck: Normal range of motion. Neck supple.  Cardiovascular: Normal rate, regular rhythm, normal heart sounds and intact distal pulses.  Exam reveals no gallop and no friction rub.   No murmur heard. Pulmonary/Chest: Effort normal. No respiratory distress. He has wheezes (prolonged expiratory phase with mild wheezing in bilateral lung fields.).  Abdominal: Soft. Bowel sounds are normal. He exhibits  no distension. There is no tenderness. There is no rebound and no guarding.  Musculoskeletal: Normal range of motion. He exhibits no edema and no tenderness.  Neurological: He is alert and oriented to person, place, and time.  Skin: Skin is warm and dry.  Psychiatric: He has a normal mood and affect. His behavior is normal.    ED Course  Procedures (including critical care time) Labs Review Labs Reviewed  CBC WITH DIFFERENTIAL - Abnormal; Notable for the following:    RBC 3.56 (*)    Hemoglobin 11.2 (*)    HCT 34.1 (*)    Monocytes Relative 13 (*)    Eosinophils Relative 15 (*)    Eosinophils Absolute 1.1 (*)    All other components within normal limits  COMPREHENSIVE METABOLIC PANEL - Abnormal; Notable for the following:    CO2 33 (*)    Glucose, Bld 107 (*)    All other components within normal limits  I-STAT ARTERIAL BLOOD GAS, ED - Abnormal; Notable for the following:    pCO2 arterial 59.5 (*)    pO2, Arterial 60.0 (*)    Bicarbonate 34.8 (*)    Acid-Base Excess 8.0 (*)    All other components within normal limits  TROPONIN I  URINE  RAPID DRUG SCREEN (HOSP PERFORMED)  URINALYSIS, ROUTINE W REFLEX MICROSCOPIC    Imaging Review Dg Chest 2 View  01/01/2014   CLINICAL DATA:  Cough, shortness of breath, multiple sclerosis, question pneumonia or aspiration, past history hypertension  EXAM: CHEST  2 VIEW  COMPARISON:  04/10/2013  FINDINGS: Mild enlargement of cardiac silhouette.  Mediastinal contours and pulmonary vascularity normal.  Chronic bronchitic changes.  Prominent RIGHT first costochondral junctions stable.  Linear scarring versus chronic atelectasis at LEFT base.  No acute infiltrate, pleural effusion or pneumothorax.  Bones unremarkable.  IMPRESSION: Chronic bronchitic changes with minimal chronic atelectasis or scarring at LEFT base.  Enlargement of cardiac silhouette.  No acute abnormalities.   Electronically Signed   By: Lavonia Dana M.D.   On: 01/01/2014 10:10     EKG Interpretation   Date/Time:  Monday January 01 2014 10:14:32 EDT Ventricular Rate:  82 PR Interval:  139 QRS Duration: 104 QT Interval:  397 QTC Calculation: 464 R Axis:   64 Text Interpretation:  Sinus or ectopic atrial rhythm Borderline low  voltage, extremity leads No significant change since last tracing  Confirmed by Esaiah Wanless  MD, Adilynn Bessey ([id number]) on 01/01/2014 10:49:31 AM      MDM   Final diagnoses:  Hypoxia  Hypercarbia    9:41 AM 48 y.o. male w hx of MS, recurrent pna who presents with productive cough and mild shortness of breath. He states that he developed a cough about 6 days ago. He noticed that his oxygen saturation was in the 80s at home over the weekend. He notified his PCP this morning and recommended he come to the ER for evaluation. He states that it is suspected that he had aspiration pneumonia last week and he was started on azithromycin and flagyl. He is still taking these medications. He is afebrile and O2 sat dec to 84% on RA, VS otherwise unremarkable. He has mild wheezing but no increased work of breathing. Will give a  breathing treatment and get screening imaging/labs. I offered steroids but he refused as he states it can interact with the once a month injection of tysabri that he gets for MS.   No evidence of pna on CXR. Abg shows mild hypercarbia, do  not know pt's baseline.   Will admit to medicine teaching service.     Blanchard Kelch, MD 01/01/14 1640

## 2014-01-01 NOTE — Progress Notes (Signed)
report received from Abigail Butts, ED RN at [id number]. Awaiting pt arrival to unit.

## 2014-01-01 NOTE — ED Notes (Signed)
Patient transported to X-ray 

## 2014-01-01 NOTE — H&P (Signed)
Date: 01/01/2014               Patient Name:  Randy Greene MRN: [id number]  DOB: 1965-07-04 Age / Sex: 48 y.o., male   PCP: [place] Service: Internal Medicine Teaching Service         Attending Physician: Dr. Karren Cobble, MD    First Contact: Dr. Hulen Luster Pager: [phone]  Second Contact: Dr. Eula Fried Pager: [phone]       After Hours (After 5p/  First Contact Pager: [phone]  weekends / holidays): Second Contact Pager: [phone]   Chief Complaint: low O2 sat at home, nausea x 2days, and fatigue  History of Present Illness: Pt is a 48 y/o male w/ PMHx of MS, GERD, and hx of recurrent PNA who presents to the [facility] ED b/c of low O2 sats at home, fatigue, and nausea. The patient has a hx of recurrent PNA from aspiration due to a hiatal hernia and his MS. Six days ago on Wednesday he felt like he aspirated and started having a sore throat w coughing and sputum production. He went to the [place] on Friday and was given a Z pack of which he only has one more day left of abx course. His sore throat has gotten better but he feels like he is panting and has been more exhausted. He has also been vomiting the past two days and last vomited this morning. He describes emesis streaked w/ red. He has had cough w/ sputum production that is gray with a pink hue, which he reports is resolving since last Wednesday. Pt denies chills and reports a subjective fever. During exam pt's O2 [place] was turned off and his O2 sat remained in the 90's with one desat around 78%.   Pt checks his O2 sats everyday and is usually in the low 90's. Pt is a former 1 ppd smoker but quit in 2008. The last episode of reflux he had was 3 months ago.   Meds: Current Facility-Administered Medications  Medication Dose Route Frequency Provider Last Rate Last Dose  . acetaminophen (TYLENOL) tablet 650 mg  650 mg Oral Q6H PRN Jerene Pitch, MD       Or  . acetaminophen (TYLENOL) suppository 650 mg  650 mg Rectal Q6H PRN  Jerene Pitch, MD      . ALPRAZolam Duanne Moron) tablet 0.5-1 mg  0.5-1 mg Oral QHS PRN Jerene Pitch, MD      . antiseptic oral rinse (BIOTENE) solution 15 mL  15 mL Mouth Rinse BID Karren Cobble, MD      . Derrill Memo ON 01/02/2014] atenolol (TENORMIN) tablet 50 mg  50 mg Oral q morning - 10a Jerene Pitch, MD      . atorvastatin (LIPITOR) tablet 20 mg  20 mg Oral QHS Jerene Pitch, MD      . baclofen (LIORESAL) tablet 10 mg  10 mg Oral QHS Karren Cobble, MD      . baclofen (LIORESAL) tablet 5 mg  5 mg Oral BID AC Karren Cobble, MD   5 mg at 01/01/14 1634  . calcium carbonate (TUMS - dosed in mg elemental calcium) chewable tablet 600 mg of elemental calcium  3 tablet Oral BID PRN Karren Cobble, MD      . dalfampridine TB12 10 mg  10 mg Oral BID Jerene Pitch, MD      . diclofenac sodium (VOLTAREN) 1 % transdermal gel 2 g  2 g Topical BID PRN Jerene Pitch, MD      . donepezil (ARICEPT) tablet 10 mg  10 mg Oral QHS Jerene Pitch, MD      . doxazosin (CARDURA) tablet 2 mg  2 mg Oral QHS Jerene Pitch, MD      . dronabinol (MARINOL) capsule 2.5 mg  2.5 mg Oral QID Jerene Pitch, MD   2.5 mg at 01/01/14 1635  . enoxaparin (LOVENOX) injection 40 mg  40 mg Subcutaneous Q24H Jerene Pitch, MD      . Derrill Memo ON 01/02/2014] ergocalciferol (DRISDOL) 8000 UNIT/ML drops 4,000 Units  4,000 Units Oral Daily Jerene Pitch, MD      . famotidine (PEPCID) tablet 20 mg  20 mg Oral BID Jerene Pitch, MD   20 mg at 01/01/14 1635  . gabapentin (NEURONTIN) capsule 400 mg  400 mg Oral QID Jerene Pitch, MD   400 mg at 01/01/14 1634  . imiquimod (ALDARA) 5 % cream 1 application  1 application Topical Once per day on Mon Wed Fri Karren Cobble, MD      . ipratropium-albuterol (DUONEB) 0.5-2.5 (3) MG/3ML nebulizer solution 3 mL  3 mL Nebulization Q4H Blanchard Kelch, MD   3 mL at 01/01/14 1646  . ipratropium-albuterol (DUONEB) 0.5-2.5 (3) MG/3ML nebulizer solution 3 mL  3 mL Nebulization Q4H PRN Jerene Pitch, MD      . lidocaine (LIDODERM) 5 % 1 patch  1 patch Transdermal Daily PRN Jerene Pitch, MD      . Derrill Memo ON 01/02/2014] methylphenidate (RITALIN) tablet 10 mg  10 mg Oral BID WC Karren Cobble, MD      . metoCLOPramide (REGLAN) tablet 10 mg  10 mg Oral QHS Jerene Pitch, MD      . Derrill Memo ON 01/02/2014] modafinil (PROVIGIL) tablet 200 mg  200 mg Oral BID WC Karren Cobble, MD      . multivitamin with minerals tablet 1 tablet  1 tablet Oral Daily Jerene Pitch, MD   1 tablet at 01/01/14 1734  . naproxen (NAPROSYN) tablet 500 mg  500 mg Oral BID PRN Jerene Pitch, MD      . ondansetron (ZOFRAN) tablet 8 mg  8 mg Oral Q8H PRN Jerene Pitch, MD      . oxyCODONE (Oxy IR/ROXICODONE) immediate release tablet 10 mg  10 mg Oral QID PRN Jerene Pitch, MD   10 mg at 01/01/14 1635  . pantoprazole (PROTONIX) EC tablet 40 mg  40 mg Oral BID Jerene Pitch, MD   40 mg at 01/01/14 1635  . rOPINIRole (REQUIP) tablet 0.5 mg  0.5 mg Oral QHS Jerene Pitch, MD      . sodium chloride 0.9 % injection 3 mL  3 mL Intravenous Q12H Jerene Pitch, MD   3 mL at 01/01/14 1637    Allergies: Allergies as of 01/01/2014 - Review Complete 01/01/2014  Allergen Reaction Noted  . Food Nausea And Vomiting 01/01/2014  . Mushroom extract complex Nausea And Vomiting 01/01/2014  . Penicillins Hives 01/01/2014  . Adhesive [tape] Rash 11/29/2012   Past Medical History  Diagnosis Date  . MS (multiple sclerosis)   . Hypertension   . Enlarged prostate   . Acid reflux   . Aspiration pneumonia   . Hypercapnic respiratory failure, chronic    Past Surgical History  Procedure Laterality Date  . Hernia repair    . Arthroscopic     History reviewed. No pertinent family history. History   Social History  .  Marital Status: Single    Spouse Name: N/A    Number of Children: N/A  . Years of Education: N/A   Occupational History  . Not on file.   Social History Main Topics  . Smoking status: Former Smoker --  1.00 packs/day    Types: Cigarettes    Quit date: 11/02/2006  . Smokeless tobacco: Never Used     Comment: 20 years  . Alcohol Use: No  . Drug Use: No  . Sexual Activity: No   Other Topics Concern  . Not on file   Social History Narrative  . No narrative on file    Review of Systems: Constitutional: positive for fatigue and night sweats Ears, nose, mouth, throat, and face: positive for sore throat Respiratory: positive for cough and sputum Cardiovascular: negative for chest pain and dyspnea Gastrointestinal: positive for diarrhea, reflux symptoms and vomiting  Physical Exam: Blood pressure 133/77, pulse 79, temperature 98.4 F (36.9 C), temperature source Oral, resp. rate 20, height 6\' 2"  (1.88 m), weight 229 lb (103.874 kg), SpO2 94.00%. General appearance: alert, cooperative and no distress Lungs: expiratory end wheezes in all lung fields, negative for egonophany or tactile fremitus, lung sounds equal Heart: regular rate and rhythm, S1, S2 normal, no murmur, click, rub or gallop Abdomen: soft, non-tender; bowel sounds normal; no masses,  no organomegaly Extremities: 2+ DP pulses b/l, wearing muscle stimulators b/l at mid shin  Lab results: Basic Metabolic Panel:  Recent Labs  01/01/14 0940  NA 140  K 3.9  CL 97  CO2 33*  GLUCOSE 107*  BUN 9  CREATININE 0.75  CALCIUM 9.4   Liver Function Tests:  Recent Labs  01/01/14 0940  AST 28  ALT 31  ALKPHOS 69  BILITOT 0.4  PROT 7.0  ALBUMIN 3.8   CBC:  Recent Labs  01/01/14 0940  WBC 7.1  NEUTROABS 3.0  HGB 11.2*  HCT 34.1*  MCV 95.8  PLT 151   Cardiac Enzymes:  Recent Labs  01/01/14 0940  TROPONINI <0.30   Results for YVES, FODOR (MRN [id number]) as of 01/01/2014 19:20  Ref. Range 01/01/2014 12:17  Sample type No range found ARTERIAL  pH, Arterial Latest Range: 7.350-7.450  7.375  pCO2 arterial Latest Range: 35.0-45.0 mmHg 59.5 (HH)  pO2, Arterial Latest Range: 80.0-100.0 mmHg 60.0 (L)    Bicarbonate Latest Range: 20.0-24.0 mEq/L 34.8 (H)  TCO2 Latest Range: 0-100 mmol/L 37  Acid-Base Excess Latest Range: 0.0-2.0 mmol/L 8.0 (H)  O2 Saturation No range found 89.0  Patient temperature No range found 98.7 F  Collection site No range found RADIAL, ALLEN'S TEST ACCEPTABLE   Imaging results:  Dg Chest 2 View  01/01/2014   CLINICAL DATA:  Cough, shortness of breath, multiple sclerosis, question pneumonia or aspiration, past history hypertension  EXAM: CHEST  2 VIEW  COMPARISON:  04/10/2013  FINDINGS: Mild enlargement of cardiac silhouette.  Mediastinal contours and pulmonary vascularity normal.  Chronic bronchitic changes.  Prominent RIGHT first costochondral junctions stable.  Linear scarring versus chronic atelectasis at LEFT base.  No acute infiltrate, pleural effusion or pneumothorax.  Bones unremarkable.  IMPRESSION: Chronic bronchitic changes with minimal chronic atelectasis or scarring at LEFT base.  Enlargement of cardiac silhouette.  No acute abnormalities.   Electronically Signed   By: Lavonia Dana M.D.   On: 01/01/2014 10:10    Other results: EKG: sinus rhythm w/ no ST elevations.  Assessment & Plan by Problem: Principal Problem:   Chronic hypercapnic respiratory failure Active Problems:  MS (multiple sclerosis)   Hypoxia   Acid reflux   Metabolic alkalosis with chronic respiratory acidosis   OSA on CPAP    Chronic hypercapnic ventilatory failure w/ new hypoxia 2/2 muscular weakness in the setting of MS-  Pt likely has a restrictive lung process, such as bronchiectasis, from recurrent aspiration PNA and chronic acid reflux. CXR on admission negative for PNA, positive for tram tracking and atelectasis. Pt having sputum production w/ some streaking. Has been on O2 in the past. Hypoxia intermittent. Based on this admission pt would benefit from further pulmonary consult if not already in the works.  - MIP and MEP (NIF and FVC) - O2 [place]  - nebulizing tx  Chronic resp  acidosis w/compensatory metabolic alkalosis with acute worsening of alkalosis in the setting of vomiting- respiratory acidosis due to the above, will tx metabolic alkalosis that is exacerbated by vomiting x 2 days. - zofran 8mg  q8h prn for nausea - recheck BMET  - IVFs   MS- stable -continue home MS meds   Chronic Acid reflux - Esophageal manomotry in 02/2012 revealed border line hypotensive LES pressures and markedly low amplitude peristaltic contractions of esophageal body w/ poor bolus clearance. Has been rec'd for Nissens fundoplication but pt refuses at this time. Followed by [facility] -protonix 40mg  BID - TUMS 600mg  BID prn for heartburn - baclofen 10mg  qhs -pepcid 20mg  BID  OSA -- pt currently sleeping w/ CPAP though may likely benefit w/ BIPAP  HLD - will continue home lipitor 20mg  daily  DVT - lovenox  Dispo: Disposition is deferred at this time, awaiting improvement of current medical problems.   The patient does have a current PCP [facility]) and does need an [facility] hospital follow-up appointment after discharge.  The patient does have transportation limitations that hinder transportation to clinic appointments.  Signed: Julious Oka, MD 01/01/2014, 7:35 PM

## 2014-01-01 NOTE — ED Notes (Signed)
This RN attempted IV stick x1 in right hand without success. Small hematoma present. Pt denies pain or discomfort. Iv team notified for IV access  Pt. Completed breathing tx. sts feels "better" and ShOB decreased. Pt taken off breathing tx. SpO2 79%/2L pt encouraged to take deep breaths SpO2 went up to 85%. Pt increased to 4L of O2 [place]. Dr. Aline Brochure made aware.

## 2014-01-01 NOTE — Progress Notes (Signed)
Pt NIF: -50  Pt VC: 2.5L  Pt gave good effort.

## 2014-01-02 DIAGNOSIS — K219 Gastro-esophageal reflux disease without esophagitis: Secondary | ICD-10-CM

## 2014-01-02 DIAGNOSIS — R0902 Hypoxemia: Secondary | ICD-10-CM

## 2014-01-02 DIAGNOSIS — G35 Multiple sclerosis: Secondary | ICD-10-CM

## 2014-01-02 DIAGNOSIS — E785 Hyperlipidemia, unspecified: Secondary | ICD-10-CM

## 2014-01-02 DIAGNOSIS — R1110 Vomiting, unspecified: Secondary | ICD-10-CM

## 2014-01-02 DIAGNOSIS — E874 Mixed disorder of acid-base balance: Secondary | ICD-10-CM

## 2014-01-02 DIAGNOSIS — G4733 Obstructive sleep apnea (adult) (pediatric): Secondary | ICD-10-CM

## 2014-01-02 LAB — GLUCOSE, CAPILLARY: Glucose-Capillary: 124 mg/dL — ABNORMAL HIGH (ref 70–99)

## 2014-01-02 MED ORDER — LEVOFLOXACIN 750 MG PO TABS: 750.0000 mg | ORAL_TABLET | Freq: Every day | ORAL | Status: DC

## 2014-01-02 MED ORDER — LEVOFLOXACIN 750 MG PO TABS
750.0000 mg | ORAL_TABLET | Freq: Every day | ORAL | Status: DC
  Administered 2014-01-02: 750 mg via ORAL
  Filled 2014-01-02: qty 1

## 2014-01-02 MED ORDER — IPRATROPIUM-ALBUTEROL 0.5-2.5 (3) MG/3ML IN SOLN
3.0000 mL | Freq: Three times a day (TID) | RESPIRATORY_TRACT | Status: DC
  Administered 2014-01-02: 3 mL via RESPIRATORY_TRACT
  Filled 2014-01-02: qty 3

## 2014-01-02 NOTE — Progress Notes (Signed)
Patient discharge teaching given, including activity, diet, follow-up appoints, and medications. Patient verbalized understanding of all discharge instructions. IV access was d/c'd. Vitals are stable. Skin is intact except as charted in most recent assessments. Pt to be escorted out by NT, to be driven home by family. 

## 2014-01-02 NOTE — Progress Notes (Signed)
Internal Medicine Attending Admission Note Date: 01/02/2014  Patient name: Randy Greene Medical record number: [id number] Date of birth: Nov 04, 1965 Age: 48 y.o. Gender: male  I saw and evaluated the patient. I reviewed the resident's note and I agree with the resident's findings and plan as documented in the resident's note.  Chief Complaint(s): Shortness of breath, productive cough, weakness  History - key components related to admission:  Randy Greene is a 48 year old man with a history of multiple sclerosis, gastric esophageal reflux disease, and reported recurrent pneumonias who presents to the emergency department because of low oxygen saturations on his home monitor. 5 days prior to admission he had a sensation of aspirating. Apparently this is not uncommon. He subsequently developed a sore throat, productive cough, and progressive shortness of breath. He noted his home oxygen saturations to be lower than usual and he started a Z-Pak as he's been instructed to do in the past. He called the [facility] when his O2 saturations were less than 90% and was told to present to the nearest emergency department. Other than the above complaints he notes occasional nausea and vomiting and generalized weakness. He also noticed that his sputum was pink today. He denies any fevers, shakes, or chills. He is without other complaints at this time.  Physical Exam - key components related to admission:  Filed Vitals:   01/02/14 0227 01/02/14 0603 01/02/14 0802 01/02/14 1025  BP: 118/77 129/87  129/84  Pulse: 86 76  96  Temp: 98.6 F (37 C) 98.1 F (36.7 C)  98.4 F (36.9 C)  TempSrc: Oral Oral  Oral  Resp: 20 18  20   Height:      Weight: 225 lb 5 oz (102.2 kg)     SpO2: 94% 92% 97% 92%   General: Well-developed, well-nourished, man lying comfortably in bed in no acute distress. Lungs: Left basilar inspiratory crackles and prolonged expiratory phase. Heart: Regular rate and rhythm without murmurs,  rubs, or gallops. Abdomen: Soft, nontender, active bowel sounds. Extremities: Without edema  Lab results:  Basic Metabolic Panel:  Recent Labs  01/01/14 0940  NA 140  K 3.9  CL 97  CO2 33*  GLUCOSE 107*  BUN 9  CREATININE 0.75  CALCIUM 9.4   Liver Function Tests:  Recent Labs  01/01/14 0940  AST 28  ALT 31  ALKPHOS 69  BILITOT 0.4  PROT 7.0  ALBUMIN 3.8   CBC:  Recent Labs  01/01/14 0940  WBC 7.1  NEUTROABS 3.0  HGB 11.2*  HCT 34.1*  MCV 95.8  PLT 151   Cardiac Enzymes:  Recent Labs  01/01/14 0940  TROPONINI <0.30   CBG:  Recent Labs  01/02/14 0754  GLUCAP 124*   Urine Drug Screen:  THC positive  Urinalysis:  Unremarkable  Misc. Labs:  Room air arterial blood gas 7.38/60/60  Imaging results:  Dg Chest 2 View  01/01/2014   CLINICAL DATA:  Cough, shortness of breath, multiple sclerosis, question pneumonia or aspiration, past history hypertension  EXAM: CHEST  2 VIEW  COMPARISON:  04/10/2013  FINDINGS: Mild enlargement of cardiac silhouette.  Mediastinal contours and pulmonary vascularity normal.  Chronic bronchitic changes.  Prominent RIGHT first costochondral junctions stable.  Linear scarring versus chronic atelectasis at LEFT base.  No acute infiltrate, pleural effusion or pneumothorax.  Bones unremarkable.  IMPRESSION: Chronic bronchitic changes with minimal chronic atelectasis or scarring at LEFT base.  Enlargement of cardiac silhouette.  No acute abnormalities.   Electronically Signed  By: Lavonia Dana M.D.   On: 01/01/2014 10:10   Other results:  EKG: Normal sinus rhythm at 82 beats per minute, normal axis, normal intervals, no LVH or significant Q waves, early R wave progression, no ST changes.  Assessment & Plan by Problem:  Randy Greene is a 48 year old man with a history of multiple sclerosis, acid reflux, and reported history of recurrent pneumonias who presents with increasing sputum production, shortness of breath, nausea, and  fevers. His chest x-ray demonstrated some mild tram tracking in the left base. His negative inspiratory force was negative 50. His arterial blood gas is notable for chronic hypoxemia and hypercapnic respiratory failure. I am concerned he presents with an exacerbation of bronchiectasis. This is quite consistent with a history of recurrent pneumonias and tram tracking on chest x-ray although the diagnostic test of choice is a high resolution CT scan. I am also concerned that his chronic respiratory hypercapnic ventilatory failure is secondary to underlying chronic obstructive pulmonary disease rather than muscle weakness. This can be further assessed with pulmonary function testing to include spirometry and diffusing capacity. These diagnoses and evaluation may have already taken place at the [place]. Unfortunately we do not have access to these records.  1) Shortness of breath with cough and blood-tinged sputum: Likely related to bronchiectasis. He will be started on Levaquin for 14 days in order to beat down the presumed exacerbation of his bronchiectasis. A note will be sent to the [facility] to consider high resolution CT scan to definitively diagnose the bronchiectasis if this has not already been done.  2) Chronic hypercapnic ventilatory failure: This is associated with hypoxia. Given his smoking history and barrel chest on examination it would be most consistent with chronic obstructive pulmonary disease. He is on as needed albuterol and will be taught how to use a spacer prior to discharge. A note will be sent to the [facility] recommending pulmonary function tests, and if progressively symptomatic addition of another bronchodilator including Atrovent, if not already done so.  3) Disposition: Randy Greene will be discharged today with followup at the [place].

## 2014-01-02 NOTE — Progress Notes (Signed)
Subjective: Pt feeling a lot better compared to yesterday. No complaints this morning. On room air satting at 91%.   Objective: Vital signs in last 24 hours: Filed Vitals:   01/02/14 0227 01/02/14 0603 01/02/14 0802 01/02/14 1025  BP: 118/77 129/87  129/84  Pulse: 86 76  96  Temp: 98.6 F (37 C) 98.1 F (36.7 C)  98.4 F (36.9 C)  TempSrc: Oral Oral  Oral  Resp: 20 18  20   Height:      Weight: 225 lb 5 oz (102.2 kg)     SpO2: 94% 92% 97% 92%   Weight change:  No intake or output data in the 24 hours ending 01/02/14 1028 General appearance: alert, cooperative and no distress  Lungs: expiratory end wheezes in all lung fields on deep expiration, negative for egonophany or tactile fremitus, lung sounds equal  Heart: regular rate and rhythm, S1, S2 normal, no murmur, click, rub or gallop  Abdomen: soft, non-tender; bowel sounds normal; no masses, no organomegaly  Extremities: 2+ DP pulses b/l, wearing muscle stimulators b/l at mid shin  Lab Results: Basic Metabolic Panel:  Recent Labs Lab 01/01/14 0940  NA 140  K 3.9  CL 97  CO2 33*  GLUCOSE 107*  BUN 9  CREATININE 0.75  CALCIUM 9.4   Liver Function Tests:  Recent Labs Lab 01/01/14 0940  AST 28  ALT 31  ALKPHOS 69  BILITOT 0.4  PROT 7.0  ALBUMIN 3.8   CBC:  Recent Labs Lab 01/01/14 0940  WBC 7.1  NEUTROABS 3.0  HGB 11.2*  HCT 34.1*  MCV 95.8  PLT 151   Cardiac Enzymes:  Recent Labs Lab 01/01/14 0940  TROPONINI <0.30    Recent Labs Lab 01/02/14 0754  GLUCAP 124*   Urinalysis:  Recent Labs Lab 01/01/14 1601  COLORURINE YELLOW  LABSPEC 1.020  PHURINE 7.0  GLUCOSEU NEGATIVE  HGBUR NEGATIVE  BILIRUBINUR NEGATIVE  KETONESUR NEGATIVE  PROTEINUR NEGATIVE  UROBILINOGEN 0.2  NITRITE NEGATIVE  LEUKOCYTESUR NEGATIVE   Studies/Results: Dg Chest 2 View  01/01/2014   CLINICAL DATA:  Cough, shortness of breath, multiple sclerosis, question pneumonia or aspiration, past history  hypertension  EXAM: CHEST  2 VIEW  COMPARISON:  04/10/2013  FINDINGS: Mild enlargement of cardiac silhouette.  Mediastinal contours and pulmonary vascularity normal.  Chronic bronchitic changes.  Prominent RIGHT first costochondral junctions stable.  Linear scarring versus chronic atelectasis at LEFT base.  No acute infiltrate, pleural effusion or pneumothorax.  Bones unremarkable.  IMPRESSION: Chronic bronchitic changes with minimal chronic atelectasis or scarring at LEFT base.  Enlargement of cardiac silhouette.  No acute abnormalities.   Electronically Signed   By: Lavonia Dana M.D.   On: 01/01/2014 10:10   Medications: I have reviewed the patient's current medications. Scheduled Meds: . antiseptic oral rinse  15 mL Mouth Rinse BID  . atenolol  50 mg Oral q morning - 10a  . atorvastatin  20 mg Oral QHS  . baclofen  10 mg Oral QHS  . baclofen  5 mg Oral BID AC  . dalfampridine  10 mg Oral BID  . donepezil  10 mg Oral QHS  . doxazosin  2 mg Oral QHS  . dronabinol  2.5 mg Oral QID  . enoxaparin (LOVENOX) injection  40 mg Subcutaneous Q24H  . ergocalciferol  4,000 Units Oral Daily  . famotidine  20 mg Oral BID  . gabapentin  400 mg Oral QID  . imiquimod  1 application Topical Once per  day on Mon Wed Fri  . ipratropium-albuterol  3 mL Nebulization TID  . levofloxacin  750 mg Oral Daily  . methylphenidate  10 mg Oral BID WC  . metoCLOPramide  10 mg Oral QHS  . modafinil  200 mg Oral BID WC  . multivitamin with minerals  1 tablet Oral Daily  . pantoprazole  40 mg Oral BID  . rOPINIRole  0.5 mg Oral QHS  . sodium chloride  3 mL Intravenous Q12H   Continuous Infusions:  PRN Meds:.acetaminophen, acetaminophen, ALPRAZolam, calcium carbonate, diclofenac sodium, ipratropium-albuterol, lidocaine, naproxen, ondansetron, oxyCODONE Assessment/Plan: Principal Problem:   Chronic hypercapnic respiratory failure Active Problems:   MS (multiple sclerosis)   Hypoxia   Acid reflux   Metabolic  alkalosis with chronic respiratory acidosis   OSA on CPAP  Chronic hypercapnic ventilatory failure w/ new hypoxia 2/2 muscular weakness in the setting of MS- Pt likely has a restrictive lung process, such as bronchiectasis, from recurrent aspiration PNA and chronic acid reflux. CXR on admission negative for PNA, positive for tram tracking and atelectasis. Pt having sputum production w/ some streaking. Has been on O2 in the past. Hypoxia intermittent. Based on this admission pt would benefit from further pulmonary consult if not already in the works.  - NIF -50 and FVC 2.5L.  - No longer requiring O2 [place] - pt has inhaler at home and does not use his spacer all the time. RT to educated patient on inhaler use w/ spacer.  - will recommend high resolution CT and PFTs to further evaluate for bronchiectasis as outpatient.  - levaquin 750mg  once a day x14 days for acute bronchiectasis exacerbation  Chronic resp acidosis w/compensatory metabolic alkalosis with acute worsening of alkalosis in the setting of vomiting- respiratory acidosis due to the above, will tx metabolic alkalosis that is exacerbated by vomiting x 2 days.  - administer IVFs yesterday which were d/c'd today, pt no longer having emesis. Will recommend BMET rechecked at hospital f/u. Pt likely w/ chronic concomitant metabolic alkalosis.   MS- stable  -continue home MS meds   Chronic Acid reflux - Esophageal manomotry in 02/2012 revealed border line hypotensive LES pressures and markedly low amplitude peristaltic contractions of esophageal body w/ poor bolus clearance. Has been rec'd for Nissens fundoplication but pt refuses at this time. Followed by [facility]  -protonix 40mg  BID  - TUMS 600mg  BID prn for heartburn  - baclofen 10mg  qhs  -pepcid 20mg  BID   OSA  -- pt currently sleeping w/ CPAP though may likely benefit w/ BIPAP  HLD   - will continue home lipitor 20mg  daily   DVT  - lovenox  Dispo: Disposition is deferred at this  time, awaiting improvement of current medical problems.  Anticipated discharge today.   The patient does have a current PCP [facility]) and does need an [facility] hospital follow-up appointment after discharge.  The patient does not have transportation limitations that hinder transportation to clinic appointments.  .Services Needed at time of discharge: Y = Yes, Blank = No PT:   OT:   RN:   Equipment:   Other:     LOS: 1 day   Julious Oka, MD 01/02/2014, 10:28 AM

## 2014-01-02 NOTE — Progress Notes (Signed)
  I have seen and examined the patient, and reviewed the daily progress note by Karlene Lineman, MS 3 and discussed the care of the patient with them. Please see my progress note from 01/02/2014 for further details regarding assessment and plan.    Signed:  Julious Oka, MD 01/02/2014, 9:57 PM

## 2014-01-02 NOTE — Discharge Summary (Signed)
Name: Randy Greene MRN: [id number] DOB: 15-Oct-1965 48 y.o. PCP: [facility]  Date of Admission: 01/01/2014  9:22 AM Date of Discharge: 01/02/2014 Attending Physician: Dr. Eppie Greene  Discharge Diagnosis: Principal Problem:   Chronic hypercapnic respiratory failure Active Problems:   MS (multiple sclerosis)   Hypoxia   Acid reflux   Metabolic alkalosis with chronic respiratory acidosis   OSA on CPAP  Discharge Medications:   Medication List    STOP taking these medications       azithromycin 250 MG tablet  Commonly known as:  ZITHROMAX     metroNIDAZOLE 500 MG tablet  Commonly known as:  FLAGYL      TAKE these medications       albuterol 108 (90 BASE) MCG/ACT inhaler  Commonly known as:  PROVENTIL HFA;VENTOLIN HFA  Inhale 2 puffs into the lungs every 6 (six) hours as needed for wheezing or shortness of breath.     ALPRAZolam 1 MG tablet  Commonly known as:  XANAX  Take 0.5-1 mg by mouth at bedtime as needed for anxiety.     AMPYRA 10 MG Tb12  Generic drug:  dalfampridine  Take 10 mg by mouth 2 (two) times daily.     atenolol 50 MG tablet  Commonly known as:  TENORMIN  Take 50 mg by mouth every morning.     atorvastatin 40 MG tablet  Commonly known as:  LIPITOR  Take 20 mg by mouth at bedtime.     B COMPLEX VITAMINS SL  Place 1 tablet under the tongue daily.     baclofen 10 MG tablet  Commonly known as:  LIORESAL  Take 5-10 mg by mouth 3 (three) times daily. Take  5mg  by mouth twice daily and take 10mg  at bedtime     Calcium Carbonate 500 MG Chew  Chew 3 tablets by mouth 2 (two) times daily as needed (for heartburn).     cetirizine 10 MG tablet  Commonly known as:  ZYRTEC  Take 10 mg by mouth daily.     diclofenac sodium 1 % Gel  Commonly known as:  VOLTAREN  Apply 2 g topically 2 (two) times daily as needed (to pain).     donepezil 10 MG tablet  Commonly known as:  ARICEPT  Take 10 mg by mouth at bedtime.     doxazosin 4 MG tablet  Commonly  known as:  CARDURA  Take 2 mg by mouth at bedtime.     dronabinol 2.5 MG capsule  Commonly known as:  MARINOL  Take 2.5 mg by mouth 4 (four) times daily.     ergocalciferol 8000 UNIT/ML drops  Commonly known as:  DRISDOL  Take 4,000 Units by mouth daily.     gabapentin 400 MG capsule  Commonly known as:  NEURONTIN  Take 400 mg by mouth 4 (four) times daily.     imiquimod 5 % cream  Commonly known as:  ALDARA  Apply 1 application topically 3 (three) times a week.     lactobacillus acidophilus Tabs tablet  Take 2 tablets by mouth 3 (three) times daily.     levofloxacin 750 MG tablet  Commonly known as:  LEVAQUIN  Take 1 tablet (750 mg total) by mouth daily.     lidocaine 5 %  Commonly known as:  LIDODERM  Place 1 patch onto the skin daily as needed (for pain). Remove & Discard patch within 12 hours or as directed by MD     methylphenidate 10 MG  tablet  Commonly known as:  RITALIN  Take 10 mg by mouth 2 (two) times daily with breakfast and lunch. Take one tablet in the morning and at noon     metoCLOPramide 10 MG tablet  Commonly known as:  REGLAN  Take 10 mg by mouth at bedtime.     modafinil 200 MG tablet  Commonly known as:  PROVIGIL  Take 200 mg by mouth 2 (two) times daily. Take one tablet in the morning and at noon. Take with ritalin     multivitamin with minerals Tabs tablet  Take 1 tablet by mouth daily.     NALTREXONE HCL PO  Take 4.5 mg by mouth at bedtime. Recommended dose is 4.5mg  daily.  Use a 50mg  pill and cut in 1/2 (25mg ).  Cut 1/2 pill into 6 pieces (4.17mg ) or 5 pieces (5mg ).     naproxen 500 MG tablet  Commonly known as:  NAPROSYN  Take 500 mg by mouth 2 (two) times daily as needed (pain). Take with food.     ondansetron 8 MG tablet  Commonly known as:  ZOFRAN  Take 8 mg by mouth every 8 (eight) hours as needed for nausea.     oxycodone 5 MG capsule  Commonly known as:  OXY-IR  Take 10 mg by mouth 4 (four) times daily as needed for pain.      pantoprazole 40 MG tablet  Commonly known as:  PROTONIX  Take 40 mg by mouth 2 (two) times daily.     potassium chloride 10 MEQ tablet  Commonly known as:  K-DUR,KLOR-CON  Take 10 mEq by mouth daily.     ranitidine 150 MG tablet  Commonly known as:  ZANTAC  Take 150 mg by mouth 2 (two) times daily.     rOPINIRole 0.5 MG tablet  Commonly known as:  REQUIP  Take 0.5 mg by mouth at bedtime. Take 1 tablet 1 hr before bedtime     rosuvastatin 10 MG tablet  Commonly known as:  CRESTOR  Take 5 mg by mouth at bedtime.     sildenafil 100 MG tablet  Commonly known as:  VIAGRA  Take 100 mg by mouth as needed for erectile dysfunction.     TYSABRI IV  Inject into the vein every 30 (thirty) days. Around the fourth week of each month        Disposition and follow-up:   [name] was discharged from [facility] in Stable condition.  At the hospital follow up visit please address:  1.  Please consider high resolution CT to evaluate for bronchiectasis and PFTs (w/ spirometry and diffusing capacity) for chronic hypercapnic ventilatory failure that could be caused by underlying COPD  2.  Pending labs/ test needing follow-up: none  Follow-up Appointments:     Follow-up Information   Follow up with Dr. Arletha Greene On 01/04/2014. [facility] follow up at 11:30 a.m. )    Contact information:   [facility], [place] [address] Phone: [phone] Fax: [phone]      Discharge Instructions: Discharge Instructions   Call MD for:  difficulty breathing, headache or visual disturbances    Complete by:  As directed      Call MD for:  extreme fatigue    Complete by:  As directed            Consultations:  none  Procedures Performed:  Dg Chest 2 View  01/01/2014   CLINICAL DATA:  Cough, shortness of breath,  multiple sclerosis, question pneumonia or aspiration, past history hypertension  EXAM: CHEST  2 VIEW  COMPARISON:  04/10/2013   FINDINGS: Mild enlargement of cardiac silhouette.  Mediastinal contours and pulmonary vascularity normal.  Chronic bronchitic changes.  Prominent RIGHT first costochondral junctions stable.  Linear scarring versus chronic atelectasis at LEFT base.  No acute infiltrate, pleural effusion or pneumothorax.  Bones unremarkable.  IMPRESSION: Chronic bronchitic changes with minimal chronic atelectasis or scarring at LEFT base.  Enlargement of cardiac silhouette.  No acute abnormalities.   Electronically Signed   By: Lavonia Dana M.D.   On: 01/01/2014 10:10    Admission HPI: [name] is a 48 year old man with a history of multiple sclerosis, gastric esophageal reflux disease, and reported recurrent pneumonias who presents to the emergency department because of low oxygen saturations on his home monitor. 5 days prior to admission he had a sensation of aspirating. Apparently this is not uncommon. He subsequently developed a sore throat, productive cough, and progressive shortness of breath. He noted his home oxygen saturations to be lower than usual and he started a Z-Pak as he's been instructed to do in the past. He called the [facility] when his O2 saturations were less than 90% and was told to present to the nearest emergency department. Other than the above complaints he notes occasional nausea and vomiting and generalized weakness. He also noticed that his sputum was pink today. He denies any fevers, shakes, or chills. He is without other complaints at this time.   Hospital Course by problem list: Principal Problem:   Chronic hypercapnic respiratory failure Active Problems:   MS (multiple sclerosis)   Hypoxia   Acid reflux   Metabolic alkalosis with chronic respiratory acidosis   OSA on CPAP   Shortness of breath with cough and blood-tinged sputum- likely presumed bronchiectasis exacerbation. Pt has history recurrent aspiration PNA and chronic acid reflux. CXR on admission negative for PNA,  positive for tram tracking and atelectasis. Pt had sputum production w/ some streaking.Pt started on levaquin 750mg  once a day for a total of 14 days. Pt recommended to have high resolution CT to diagnose bronchiectasis.   Chronic hypercapnic ventilatory failure- Pt had intermittent hypoxia. Respiratory therapy educated pt on spacer use with his albuterol inhaler. Negative inspiratory force was -50 and FVC 2.5L. Will recommend considering PFTs to further evaluate as outpatient. If pt's symptoms get progressively worse will likely need the addition of another bronchodilator. On d/c pt no longer requiring oxygen.     Chronic Acid reflux - Esophageal manomotry in 02/2012 revealed border line hypotensive LES pressures and markedly low amplitude peristaltic contractions of esophageal body w/ poor bolus clearance. Has been rec'd for Nissens fundoplication but pt refuses at this time. Followed by [facility]. Pt's home meds protonix 40mg  BID, TUMS 600mg  BID prn for heartburn, baclofen 10mg  qhs, and pepcid 20mg  BID were continued during hospital course.   Discharge Vitals:   BP 129/84  Pulse 96  Temp(Src) 98.4 F (36.9 C) (Oral)  Resp 20  Ht 6\' 2"  (1.88 m)  Wt 225 lb 5 oz (102.2 kg)  BMI 28.92 kg/m2  SpO2 92%  Discharge Labs:  Results for orders placed during the hospital encounter of 01/01/14 (from the past 24 hour(s))  GLUCOSE, CAPILLARY     Status: Abnormal   Collection Time    01/02/14  7:54 AM      Result Value Ref Range   Glucose-Capillary 124 (*) 70 - 99 mg/dL  Signed: Julious Oka, MD 01/02/2014, 9:42 PM    Services Ordered on Discharge: none Equipment Ordered on Discharge: none

## 2014-01-02 NOTE — Discharge Instructions (Signed)
Please take levaquin 750mg  once a day for 14 days including today. Please keep your hospital follow up appt. With your PCP Dr. Arletha Grippe and use your spacer with your inhaler. Please consider pulmonary function testing at this visit. Keep checking pulse ox and if noticing consistently low pulse ox readings please contact PCP. If experience severe shortness of breath or increase work of breathing go to the ED.     Levofloxacin tablets What is this medicine? LEVOFLOXACIN (lee voe FLOX a sin) is a quinolone antibiotic. It is used to treat certain kinds of bacterial infections. It will not work for colds, flu, or other viral infections. This medicine may be used for other purposes; ask your health care provider or pharmacist if you have questions. COMMON BRAND NAME(S): Levaquin, Levaquin Leva-Pak What should I tell my health care provider before I take this medicine? They need to know if you have any of these conditions: -cerebral disease -irregular heartbeat -kidney disease -seizure disorder -an unusual or allergic reaction to levofloxacin, other antibiotics or medicines, foods, dyes, or preservatives -pregnant or trying to get pregnant -breast-feeding How should I use this medicine? Take this medicine by mouth with a full glass of water. Follow the directions on the prescription label. This medicine can be taken with or without food. Take your medicine at regular intervals. Do not take your medicine more often than directed. Do not skip doses or stop your medicine early even if you feel better. Do not stop taking except on your doctor's advice. A special MedGuide will be given to you by the pharmacist with each prescription and refill. Be sure to read this information carefully each time. Talk to your pediatrician regarding the use of this medicine in children. While this drug may be prescribed for children as young as 6 months for selected conditions, precautions do apply. Overdosage: If you think  you have taken too much of this medicine contact a poison control center or emergency room at once. NOTE: This medicine is only for you. Do not share this medicine with others. What if I miss a dose? If you miss a dose, take it as soon as you remember. If it is almost time for your next dose, take only that dose. Do not take double or extra doses. What may interact with this medicine? Do not take this medicine with any of the following medications: -arsenic trioxide -chloroquine -droperidol -medicines for irregular heart rhythm like amiodarone, disopyramide, dofetilide, flecainide, quinidine, procainamide, sotalol -some medicines for depression or mental problems like phenothiazines, pimozide, and ziprasidone This medicine may also interact with the following medications: -amoxapine -antacids -birth control pills -cisapride -dairy products -didanosine (ddI) buffered tablets or powder -haloperidol -multivitamins -NSAIDS, medicines for pain and inflammation, like ibuprofen or naproxen -retinoid products like tretinoin or isotretinoin -risperidone -some other antibiotics like clarithromycin or erythromycin -sucralfate -theophylline -warfarin This list may not describe all possible interactions. Give your health care provider a list of all the medicines, herbs, non-prescription drugs, or dietary supplements you use. Also tell them if you smoke, drink alcohol, or use illegal drugs. Some items may interact with your medicine. What should I watch for while using this medicine? Tell your doctor or health care professional if your symptoms do not improve or if they get worse. Drink several glasses of water a day and cut down on drinks that contain caffeine. You must not get dehydrated while taking this medicine. You may get drowsy or dizzy. Do not drive, use machinery, or do anything  that needs mental alertness until you know how this medicine affects you. Do not sit or stand up quickly,  especially if you are an older patient. This reduces the risk of dizzy or fainting spells. This medicine can make you more sensitive to the sun. Keep out of the sun. If you cannot avoid being in the sun, wear protective clothing and use a sunscreen. Do not use sun lamps or tanning beds/booths. Contact your doctor if you get a sunburn. If you are a diabetic monitor your blood glucose carefully. If you get an unusual reading stop taking this medicine and call your doctor right away. Do not treat diarrhea with over-the-counter products. Contact your doctor if you have diarrhea that lasts more than 2 days or if the diarrhea is severe and watery. Avoid antacids, calcium, iron, and zinc products for 2 hours before and 2 hours after taking a dose of this medicine. What side effects may I notice from receiving this medicine? Side effects that you should report to your doctor or health care professional as soon as possible: -allergic reactions like skin rash or hives, swelling of the face, lips, or tongue -changes in vision -confusion, nightmares or hallucinations -difficulty breathing -irregular heartbeat, chest pain -joint, muscle or tendon pain -pain or difficulty passing urine -persistent headache with or without blurred vision -redness, blistering, peeling or loosening of the skin, including inside the mouth -seizures -unusual pain, numbness, tingling, or weakness -vaginal irritation, discharge Side effects that usually do not require medical attention (report to your doctor or health care professional if they continue or are bothersome): -diarrhea -dry mouth -headache -stomach upset, nausea -trouble sleeping This list may not describe all possible side effects. Call your doctor for medical advice about side effects. You may report side effects to FDA at 1-800-FDA-1088. Where should I keep my medicine? Keep out of the reach of children. Store at room temperature between 15 and 30 degrees C  (59 and 86 degrees F). Keep in a tightly closed container. Throw away any unused medicine after the expiration date. NOTE: This sheet is a summary. It may not cover all possible information. If you have questions about this medicine, talk to your doctor, pharmacist, or health care provider.  2015, [employer]. (2013-01-13 07:45:07)  Bronchiectasis Bronchiectasis is a condition in which the airways (bronchi) are damaged and widened. This makes it difficult for the lungs to get rid of mucus. As a result, mucus gathers in the airways, and this often leads to lung infections. Infection can cause inflammation in the airways, which may further weaken and damage the bronchi.  CAUSES  Bronchiectasis may be present at birth (congenital) or may develop later in life. Sometimes there is no apparent cause. Some common causes include:  Cystic fibrosis.   Recurrent lung infections (such as pneumonia, tuberculosis, or fungal infections).  Foreign bodies or other blockages in the lungs.  Breathing in fluid, food, or other foreign objects (aspiration). SIGNS AND SYMPTOMS  Common symptoms include:  A daily cough that brings up mucus and lasts for more than 3 weeks.  Frequent lung infections (such as pneumonia, tuberculosis, or fungal infections).  Shortness of breath and wheezing.   Weakness and fatigue. DIAGNOSIS  Various tests may be done to help diagnose bronchiectasis. Tests may include:  Chest X-rays or CT scans.   Breathing tests to help determine how your lungs are working.   Sputum cultures to check for infection.   Blood tests and other tests to check for related  diseases or causes, such as cystic fibrosis. TREATMENT  Treatment varies depending on the severity of the condition. Medicines may be given to loosen the mucus to be coughed up (expectorants), to relax the muscles of the air passages (bronchodilators), or to prevent or treat infections (antibiotics). Physical  therapy methods may be recommended to help clear mucus from the lungs. For severe cases, surgery may be done to remove the affected part of the lung. HOME CARE INSTRUCTIONS   Get plenty of rest.   Only take over-the-counter or prescription medicines as directed by your health care provider. If antibiotic medicines were prescribed, take them as directed. Finish them even if you start to feel better.  Avoid sedatives and antihistamines unless otherwise directed by your health care provider. These medicines tend to thicken the mucus in the lungs.   Perform any breathing exercises or techniques to clear the lungs as directed by your health care provider.  Drink enough fluids to keep your urine clear or pale yellow.  Consider using a cold steam vaporizer or humidifier in your room or home to help loosen secretions.   If the cough is worse at night, try sleeping in a semi-upright position in a recliner or using a couple of pillows.   Avoid cigarette smoke and lung irritants. If you smoke, quit.  Stay inside when pollution and ozone levels are high.   Stay current with vaccinations and immunizations.   Follow up with your health care provider as directed.  SEEK MEDICAL CARE IF:  You cough up more thick, discolored mucus (sputum) that is yellow to green in color.  You have a fever or persistent symptoms for more than 2-3 days.  You cannot control your cough and are losing sleep. SEEK IMMEDIATE MEDICAL CARE IF:   You cough up blood.   You have chest pain or increasing shortness of breath.   You have pain that is getting worse or is uncontrolled with medicines.   You have a fever and your symptoms suddenly get worse. MAKE SURE YOU:  Understand these instructions.   Will watch your condition.   Will get help right away if you are not doing well or get worse.  Document Released: 04/05/2007 Document Revised: 06/13/2013 Document Reviewed: 12/14/2012 [facility] Patient  Information 2015 [place], [place]. This information is not intended to replace advice given to you by your health care provider. Make sure you discuss any questions you have with your health care provider.

## 2014-01-02 NOTE — Progress Notes (Signed)
Subjective: This morning Randy Greene feels good and reports doing well overnight. He has no complaints. Objective: Vital signs in last 24 hours: Filed Vitals:   01/01/14 2131 01/02/14 0057 01/02/14 0227 01/02/14 0603  BP:   118/77 129/87  Pulse: 85 104 86 76  Temp:   98.6 F (37 C) 98.1 F (36.7 C)  TempSrc:   Oral Oral  Resp: 18 18 20 18   Height:      Weight:   102.2 kg (225 lb 5 oz)   SpO2: 100% 95% 94% 92%    No intake or output data in the 24 hours ending 01/02/14 0624  General appearance: Well nourished, well developed male lying in bed in no acute distress  Lungs: end expiratory wheezes in all lung fields on deep expiration, L basilar inspiratory crackles Heart: RRR, S1, S2 normal, no murmur, click, rub or gallop  Abdomen: soft, non-tender; bowel sounds normal  Extremities: no edema, wearing muscle stimulators b/l at mid shin  Lab Results: Basic Metabolic Panel:  Recent Labs Lab 01/01/14 0940  NA 140  K 3.9  CL 97  CO2 33*  GLUCOSE 107*  BUN 9  CREATININE 0.75  CALCIUM 9.4   Liver Function Tests:  Recent Labs Lab 01/01/14 0940  AST 28  ALT 31  ALKPHOS 69  BILITOT 0.4  PROT 7.0  ALBUMIN 3.8   CBC:  Recent Labs Lab 01/01/14 0940  WBC 7.1  NEUTROABS 3.0  HGB 11.2*  HCT 34.1*  MCV 95.8  PLT 151   Cardiac Enzymes:  Recent Labs Lab 01/01/14 0940  TROPONINI <0.30   Urine Drug Screen: Drugs of Abuse     Component Value Date/Time   LABOPIA NONE DETECTED 01/01/2014 1601   COCAINSCRNUR NONE DETECTED 01/01/2014 1601   LABBENZ NONE DETECTED 01/01/2014 1601   AMPHETMU NONE DETECTED 01/01/2014 1601   THCU POSITIVE* 01/01/2014 1601   LABBARB NONE DETECTED 01/01/2014 1601    Urinalysis:  Recent Labs Lab 01/01/14 1601  COLORURINE YELLOW  LABSPEC 1.020  PHURINE 7.0  GLUCOSEU NEGATIVE  HGBUR NEGATIVE  BILIRUBINUR NEGATIVE  KETONESUR NEGATIVE  PROTEINUR NEGATIVE  UROBILINOGEN 0.2  NITRITE NEGATIVE  LEUKOCYTESUR NEGATIVE    Studies/Results: Dg Chest 2 View  01/01/2014   CLINICAL DATA:  Cough, shortness of breath, multiple sclerosis, question pneumonia or aspiration, past history hypertension  EXAM: CHEST  2 VIEW  COMPARISON:  04/10/2013  FINDINGS: Mild enlargement of cardiac silhouette.  Mediastinal contours and pulmonary vascularity normal.  Chronic bronchitic changes.  Prominent RIGHT first costochondral junctions stable.  Linear scarring versus chronic atelectasis at LEFT base.  No acute infiltrate, pleural effusion or pneumothorax.  Bones unremarkable.  IMPRESSION: Chronic bronchitic changes with minimal chronic atelectasis or scarring at LEFT base.  Enlargement of cardiac silhouette.  No acute abnormalities.   Electronically Signed   By: Lavonia Dana M.D.   On: 01/01/2014 10:10   Medications: I have reviewed the patient's current medications. Scheduled Meds: . antiseptic oral rinse  15 mL Mouth Rinse BID  . atenolol  50 mg Oral q morning - 10a  . atorvastatin  20 mg Oral QHS  . baclofen  10 mg Oral QHS  . baclofen  5 mg Oral BID AC  . dalfampridine  10 mg Oral BID  . donepezil  10 mg Oral QHS  . doxazosin  2 mg Oral QHS  . dronabinol  2.5 mg Oral QID  . enoxaparin (LOVENOX) injection  40 mg Subcutaneous Q24H  . ergocalciferol  4,000  Units Oral Daily  . famotidine  20 mg Oral BID  . gabapentin  400 mg Oral QID  . imiquimod  1 application Topical Once per day on Mon Wed Fri  . ipratropium-albuterol  3 mL Nebulization TID  . methylphenidate  10 mg Oral BID WC  . metoCLOPramide  10 mg Oral QHS  . modafinil  200 mg Oral BID WC  . multivitamin with minerals  1 tablet Oral Daily  . pantoprazole  40 mg Oral BID  . rOPINIRole  0.5 mg Oral QHS  . sodium chloride  3 mL Intravenous Q12H   Continuous Infusions:  PRN Meds:.acetaminophen, acetaminophen, ALPRAZolam, calcium carbonate, diclofenac sodium, ipratropium-albuterol, lidocaine, naproxen, ondansetron, oxyCODONE Assessment/Plan:  In summary, Randy Greene  is 48 y/o male with stable MS, GERD, OSA on CPAP and hx of recurrent aspiration PNA who presented for intermittent hypoxia on home pulse ox, blood tinged sputum production, SOB, nausea and fevers.   Chronic hypercapnic ventilatory failure w/ hypoxia and SOB: Acute bronchiectasis exacerbation 2/2 recurrent aspiration pneumonia and chronic acid reflux is suspected based on CXR negative for pneumonia, positive for mild tram tracking at L base and pt report of blood tinged sputum, SOB, and fevers. COPD likely contributory based on his 15 pack year history (quit 2009) and barrel chest on CXR. Muscular weakness may also play a role though NIF of -50 and FVC of 2.5 L are only moderately abnormal. SpO2 90% on room air this morning.  - Will recommend high resolution CT and PFTs for further evaluation of bronchiectasis and COPD, respectively, to be done at [facility] if not already completed  - Respiratory therapy to educate patient on use of inhaler with spacer at home.  - Levofloxacin 750 mg daily x14 days for suspected acute bronchiectasis exacerbation.   Acute worsening of metabolic alkalosis in setting of vomiting: Patient has a chronic respiratory acidosis 2/2 above problem with appropriate compensation via metabolic alkalosis. The metabolic alkalosis was likely worsened by pt's 2 day history of vomiting. Emesis has ceased and IVFs were administered yesterday.  - BMET to be checked at hospital follow up   Chronic Acid Reflux: Followed by [facility] and [facility].   - Protonix 40 mg BID - Calcium carbonate 600 mg BID prn for heartburn - Baclofen 10 mg QHS - Famotidine 20 mg BID  MS: Stable  - Continue home medications   OSA: Patient currently sleeps with CPAP  - Would potentially benefit from BIPAP  HLD:  - Continue home lipitor 20mg  daily   DVT  - Enoxaparin 40 mg Advance Q24H  Dispo: Plan for discharge today with follow up at outpatient [place] clinic.    This is a Careers information officer Note.  The care of the patient was  discussed with Dr. Hulen Luster and the assessment and plan formulated with their assistance.  Please see their attached note for official documentation of the daily encounter.   LOS: 1 day   Ilona Sorrel, Med Student 01/02/2014, 6:24 AM

## 2014-01-02 NOTE — Progress Notes (Signed)
NIF= -60 but VC manometer not working, will try again if pt has not left

## 2014-01-03 MED ORDER — LEVOFLOXACIN 750 MG PO TABS: 750.0000 mg | ORAL_TABLET | Freq: Every day | ORAL | Status: DC

## 2014-03-01 ENCOUNTER — Inpatient Hospital Stay (HOSPITAL_COMMUNITY): Payer: Medicare Other

## 2014-03-01 ENCOUNTER — Encounter (HOSPITAL_COMMUNITY): Payer: Self-pay | Admitting: Emergency Medicine

## 2014-03-01 ENCOUNTER — Inpatient Hospital Stay (HOSPITAL_COMMUNITY)
Admission: EM | Admit: 2014-03-01 | Discharge: 2014-03-07 | DRG: 871 | Disposition: A | Discharge status: Home / Self Care | Payer: Medicare Other | Attending: Pulmonary Disease | Admitting: Pulmonary Disease

## 2014-03-01 ENCOUNTER — Emergency Department (HOSPITAL_COMMUNITY): Payer: Medicare Other

## 2014-03-01 DIAGNOSIS — G4733 Obstructive sleep apnea (adult) (pediatric): Secondary | ICD-10-CM | POA: Diagnosis present

## 2014-03-01 DIAGNOSIS — F1110 Opioid abuse, uncomplicated: Secondary | ICD-10-CM | POA: Diagnosis present

## 2014-03-01 DIAGNOSIS — N179 Acute kidney failure, unspecified: Secondary | ICD-10-CM | POA: Diagnosis not present

## 2014-03-01 DIAGNOSIS — F411 Generalized anxiety disorder: Secondary | ICD-10-CM | POA: Diagnosis present

## 2014-03-01 DIAGNOSIS — E876 Hypokalemia: Secondary | ICD-10-CM

## 2014-03-01 DIAGNOSIS — I498 Other specified cardiac arrhythmias: Secondary | ICD-10-CM | POA: Diagnosis present

## 2014-03-01 DIAGNOSIS — F1210 Cannabis abuse, uncomplicated: Secondary | ICD-10-CM | POA: Diagnosis present

## 2014-03-01 DIAGNOSIS — N400 Enlarged prostate without lower urinary tract symptoms: Secondary | ICD-10-CM | POA: Diagnosis present

## 2014-03-01 DIAGNOSIS — R7989 Other specified abnormal findings of blood chemistry: Secondary | ICD-10-CM

## 2014-03-01 DIAGNOSIS — Z87891 Personal history of nicotine dependence: Secondary | ICD-10-CM | POA: Diagnosis not present

## 2014-03-01 DIAGNOSIS — R4182 Altered mental status, unspecified: Secondary | ICD-10-CM

## 2014-03-01 DIAGNOSIS — J9612 Chronic respiratory failure with hypercapnia: Secondary | ICD-10-CM

## 2014-03-01 DIAGNOSIS — G9340 Encephalopathy, unspecified: Secondary | ICD-10-CM | POA: Diagnosis not present

## 2014-03-01 DIAGNOSIS — J9600 Acute respiratory failure, unspecified whether with hypoxia or hypercapnia: Secondary | ICD-10-CM

## 2014-03-01 DIAGNOSIS — R451 Restlessness and agitation: Secondary | ICD-10-CM

## 2014-03-01 DIAGNOSIS — A4159 Other Gram-negative sepsis: Secondary | ICD-10-CM | POA: Diagnosis present

## 2014-03-01 DIAGNOSIS — R7401 Elevation of levels of liver transaminase levels: Secondary | ICD-10-CM

## 2014-03-01 DIAGNOSIS — E785 Hyperlipidemia, unspecified: Secondary | ICD-10-CM | POA: Diagnosis present

## 2014-03-01 DIAGNOSIS — Z9989 Dependence on other enabling machines and devices: Secondary | ICD-10-CM

## 2014-03-01 DIAGNOSIS — G35 Multiple sclerosis: Secondary | ICD-10-CM | POA: Diagnosis present

## 2014-03-01 DIAGNOSIS — A419 Sepsis, unspecified organism: Secondary | ICD-10-CM | POA: Diagnosis present

## 2014-03-01 DIAGNOSIS — R0902 Hypoxemia: Secondary | ICD-10-CM

## 2014-03-01 DIAGNOSIS — Z79899 Other long term (current) drug therapy: Secondary | ICD-10-CM

## 2014-03-01 DIAGNOSIS — F1310 Sedative, hypnotic or anxiolytic abuse, uncomplicated: Secondary | ICD-10-CM | POA: Diagnosis present

## 2014-03-01 DIAGNOSIS — J9601 Acute respiratory failure with hypoxia: Secondary | ICD-10-CM

## 2014-03-01 DIAGNOSIS — IMO0002 Reserved for concepts with insufficient information to code with codable children: Secondary | ICD-10-CM

## 2014-03-01 DIAGNOSIS — K219 Gastro-esophageal reflux disease without esophagitis: Secondary | ICD-10-CM | POA: Diagnosis present

## 2014-03-01 DIAGNOSIS — I10 Essential (primary) hypertension: Secondary | ICD-10-CM | POA: Diagnosis present

## 2014-03-01 DIAGNOSIS — K2100 Gastro-esophageal reflux disease with esophagitis, without bleeding: Secondary | ICD-10-CM

## 2014-03-01 DIAGNOSIS — G547 Phantom limb syndrome without pain: Secondary | ICD-10-CM | POA: Diagnosis present

## 2014-03-01 DIAGNOSIS — J9611 Chronic respiratory failure with hypoxia: Secondary | ICD-10-CM | POA: Diagnosis present

## 2014-03-01 DIAGNOSIS — R740 Nonspecific elevation of levels of transaminase and lactic acid dehydrogenase [LDH]: Secondary | ICD-10-CM

## 2014-03-01 DIAGNOSIS — G35D Multiple sclerosis, unspecified: Secondary | ICD-10-CM

## 2014-03-01 DIAGNOSIS — J690 Pneumonitis due to inhalation of food and vomit: Secondary | ICD-10-CM | POA: Diagnosis present

## 2014-03-01 DIAGNOSIS — J9610 Chronic respiratory failure, unspecified whether with hypoxia or hypercapnia: Secondary | ICD-10-CM

## 2014-03-01 DIAGNOSIS — E874 Mixed disorder of acid-base balance: Secondary | ICD-10-CM

## 2014-03-01 DIAGNOSIS — I517 Cardiomegaly: Secondary | ICD-10-CM

## 2014-03-01 DIAGNOSIS — J9620 Acute and chronic respiratory failure, unspecified whether with hypoxia or hypercapnia: Secondary | ICD-10-CM | POA: Diagnosis present

## 2014-03-01 LAB — CBC WITH DIFFERENTIAL/PLATELET
BASOS ABS: 0.1 10*3/uL (ref 0.0–0.1)
BASOS PCT: 0 % (ref 0–1)
Eosinophils Absolute: 0.6 10*3/uL (ref 0.0–0.7)
Eosinophils Relative: 4 % (ref 0–5)
HEMATOCRIT: 38.1 % — AB (ref 39.0–52.0)
Hemoglobin: 12.9 g/dL — ABNORMAL LOW (ref 13.0–17.0)
LYMPHS PCT: 12 % (ref 12–46)
Lymphs Abs: 2 10*3/uL (ref 0.7–4.0)
MCH: 31.5 pg (ref 26.0–34.0)
MCHC: 33.9 g/dL (ref 30.0–36.0)
MCV: 93.2 fL (ref 78.0–100.0)
MONO ABS: 1.3 10*3/uL — AB (ref 0.1–1.0)
Monocytes Relative: 8 % (ref 3–12)
Neutro Abs: 12.4 10*3/uL — ABNORMAL HIGH (ref 1.7–7.7)
Neutrophils Relative %: 76 % (ref 43–77)
PLATELETS: 197 10*3/uL (ref 150–400)
RBC: 4.09 MIL/uL — ABNORMAL LOW (ref 4.22–5.81)
RDW: 14 % (ref 11.5–15.5)
WBC: 16.4 10*3/uL — AB (ref 4.0–10.5)

## 2014-03-01 LAB — RAPID URINE DRUG SCREEN, HOSP PERFORMED
Amphetamines: POSITIVE — AB
Barbiturates: NOT DETECTED
Benzodiazepines: POSITIVE — AB
Cocaine: NOT DETECTED
Opiates: POSITIVE — AB
Tetrahydrocannabinol: POSITIVE — AB

## 2014-03-01 LAB — URINALYSIS, ROUTINE W REFLEX MICROSCOPIC
Glucose, UA: NEGATIVE mg/dL
Ketones, ur: 40 mg/dL — AB
LEUKOCYTES UA: NEGATIVE
Nitrite: NEGATIVE
Protein, ur: 30 mg/dL — AB
SPECIFIC GRAVITY, URINE: 1.027 (ref 1.005–1.030)
UROBILINOGEN UA: 0.2 mg/dL (ref 0.0–1.0)
pH: 6.5 (ref 5.0–8.0)

## 2014-03-01 LAB — URINE MICROSCOPIC-ADD ON

## 2014-03-01 LAB — POCT I-STAT 3, ART BLOOD GAS (G3+)
ACID-BASE EXCESS: 2 mmol/L (ref 0.0–2.0)
Bicarbonate: 27.3 mEq/L — ABNORMAL HIGH (ref 20.0–24.0)
O2 Saturation: 84 %
TCO2: 29 mmol/L (ref 0–100)
pCO2 arterial: 45.7 mmHg — ABNORMAL HIGH (ref 35.0–45.0)
pH, Arterial: 7.385 (ref 7.350–7.450)
pO2, Arterial: 50 mmHg — ABNORMAL LOW (ref 80.0–100.0)

## 2014-03-01 LAB — I-STAT CG4 LACTIC ACID, ED: LACTIC ACID, VENOUS: 7.24 mmol/L — AB (ref 0.5–2.2)

## 2014-03-01 LAB — COMPREHENSIVE METABOLIC PANEL
ALBUMIN: 4.4 g/dL (ref 3.5–5.2)
ALT: 28 U/L (ref 0–53)
AST: 26 U/L (ref 0–37)
Alkaline Phosphatase: 78 U/L (ref 39–117)
Anion gap: 22 — ABNORMAL HIGH (ref 5–15)
BUN: 9 mg/dL (ref 6–23)
CALCIUM: 10 mg/dL (ref 8.4–10.5)
CO2: 22 mEq/L (ref 19–32)
CREATININE: 0.85 mg/dL (ref 0.50–1.35)
Chloride: 100 mEq/L (ref 96–112)
GFR calc Af Amer: >90 mL/min (ref >90)
GFR calc non Af Amer: >90 mL/min (ref >90)
Glucose, Bld: 155 mg/dL — ABNORMAL HIGH (ref 70–99)
Potassium: 3.6 mEq/L — ABNORMAL LOW (ref 3.7–5.3)
SODIUM: 144 meq/L (ref 137–147)
Total Bilirubin: 0.7 mg/dL (ref 0.3–1.2)
Total Protein: 7.7 g/dL (ref 6.0–8.3)

## 2014-03-01 LAB — TSH: TSH: 0.952 u[IU]/mL (ref 0.350–4.500)

## 2014-03-01 LAB — GLUCOSE, CAPILLARY
Glucose-Capillary: 107 mg/dL — ABNORMAL HIGH (ref 70–99)
Glucose-Capillary: 123 mg/dL — ABNORMAL HIGH (ref 70–99)
Glucose-Capillary: 120 mg/dL — ABNORMAL HIGH (ref 70–99)

## 2014-03-01 LAB — CBC
HEMATOCRIT: 40.6 % (ref 39.0–52.0)
HEMATOCRIT: 37.6 % — AB (ref 39.0–52.0)
Hemoglobin: 13.6 g/dL (ref 13.0–17.0)
Hemoglobin: 12.7 g/dL — ABNORMAL LOW (ref 13.0–17.0)
MCH: 32.2 pg (ref 26.0–34.0)
MCH: 32.2 pg (ref 26.0–34.0)
MCHC: 33.5 g/dL (ref 30.0–36.0)
MCHC: 33.8 g/dL (ref 30.0–36.0)
MCV: 96 fL (ref 78.0–100.0)
MCV: 95.4 fL (ref 78.0–100.0)
PLATELETS: 135 10*3/uL — AB (ref 150–400)
Platelets: 137 10*3/uL — ABNORMAL LOW (ref 150–400)
RBC: 4.23 MIL/uL (ref 4.22–5.81)
RBC: 3.94 MIL/uL — ABNORMAL LOW (ref 4.22–5.81)
RDW: 14.5 % (ref 11.5–15.5)
RDW: 14.7 % (ref 11.5–15.5)
WBC: 3.6 10*3/uL — AB (ref 4.0–10.5)
WBC: 10.9 10*3/uL — ABNORMAL HIGH (ref 4.0–10.5)

## 2014-03-01 LAB — LACTIC ACID, PLASMA
Lactic Acid, Venous: 1.9 mmol/L (ref 0.5–2.2)
Lactic Acid, Venous: 1.7 mmol/L (ref 0.5–2.2)

## 2014-03-01 LAB — MRSA PCR SCREENING: MRSA BY PCR: NEGATIVE

## 2014-03-01 LAB — CREATININE, SERUM: CREATININE: 0.89 mg/dL (ref 0.50–1.35)

## 2014-03-01 LAB — CK: CK TOTAL: 224 U/L (ref 7–232)

## 2014-03-01 LAB — RPR

## 2014-03-01 LAB — TRIGLYCERIDES: TRIGLYCERIDES: 210 mg/dL — AB (ref <150)

## 2014-03-01 MED ORDER — SODIUM CHLORIDE 0.9 % IV SOLN
INTRAVENOUS | Status: DC
  Administered 2014-03-01 – 2014-03-04 (×4): via INTRAVENOUS

## 2014-03-01 MED ORDER — LORAZEPAM 2 MG/ML IJ SOLN
1.0000 mg | Freq: Once | INTRAMUSCULAR | Status: AC
  Administered 2014-03-01: 1 mg via INTRAVENOUS
  Filled 2014-03-01: qty 1

## 2014-03-01 MED ORDER — LORAZEPAM 1 MG PO TABS: 1.0000 mg | ORAL_TABLET | Freq: Once | ORAL | Status: DC

## 2014-03-01 MED ORDER — BACLOFEN 10 MG PO TABS
10.0000 mg | ORAL_TABLET | Freq: Every day | ORAL | Status: DC
  Administered 2014-03-01 – 2014-03-06 (×6): 10 mg via ORAL
  Filled 2014-03-01 (×8): qty 1

## 2014-03-01 MED ORDER — BACLOFEN 5 MG HALF TABLET
5.0000 mg | ORAL_TABLET | Freq: Two times a day (BID) | ORAL | Status: DC
  Administered 2014-03-01 – 2014-03-07 (×12): 5 mg via ORAL
  Filled 2014-03-01 (×17): qty 1

## 2014-03-01 MED ORDER — PROPOFOL 10 MG/ML IV EMUL
5.0000 ug/kg/min | INTRAVENOUS | Status: DC
  Administered 2014-03-01: 50 ug/kg/min via INTRAVENOUS
  Administered 2014-03-01: 30 ug/kg/min via INTRAVENOUS
  Administered 2014-03-02 (×3): 50 ug/kg/min via INTRAVENOUS
  Administered 2014-03-02 (×2): 30 ug/kg/min via INTRAVENOUS
  Administered 2014-03-02: 50 ug/kg/min via INTRAVENOUS
  Administered 2014-03-02: 40 ug/kg/min via INTRAVENOUS
  Administered 2014-03-03: 60 ug/kg/min via INTRAVENOUS
  Administered 2014-03-03: 50 ug/kg/min via INTRAVENOUS
  Administered 2014-03-03 (×3): 60 ug/kg/min via INTRAVENOUS
  Administered 2014-03-03: 70 ug/kg/min via INTRAVENOUS
  Administered 2014-03-03: 50 ug/kg/min via INTRAVENOUS
  Administered 2014-03-04 – 2014-03-05 (×12): 60 ug/kg/min via INTRAVENOUS
  Filled 2014-03-01 (×5): qty 100
  Filled 2014-03-01: qty 200
  Filled 2014-03-01: qty 100
  Filled 2014-03-01: qty 200
  Filled 2014-03-01 (×21): qty 100

## 2014-03-01 MED ORDER — SUCCINYLCHOLINE CHLORIDE 20 MG/ML IJ SOLN
INTRAMUSCULAR | Status: AC
  Filled 2014-03-01: qty 1

## 2014-03-01 MED ORDER — ACETAMINOPHEN 650 MG RE SUPP
650.0000 mg | Freq: Four times a day (QID) | RECTAL | Status: DC | PRN
  Administered 2014-03-01 – 2014-03-02 (×2): 650 mg via RECTAL
  Filled 2014-03-01 (×2): qty 1

## 2014-03-01 MED ORDER — PIPERACILLIN-TAZOBACTAM 3.375 G IVPB
3.3750 g | Freq: Three times a day (TID) | INTRAVENOUS | Status: DC
  Administered 2014-03-01 – 2014-03-03 (×7): 3.375 g via INTRAVENOUS
  Filled 2014-03-01 (×8): qty 50

## 2014-03-01 MED ORDER — LIDOCAINE HCL (CARDIAC) 20 MG/ML IV SOLN
INTRAVENOUS | Status: AC
  Filled 2014-03-01: qty 5

## 2014-03-01 MED ORDER — SODIUM CHLORIDE 0.9 % IV SOLN
0.0000 ug/h | INTRAVENOUS | Status: DC
  Administered 2014-03-01: 50 ug/h via INTRAVENOUS
  Filled 2014-03-01: qty 50

## 2014-03-01 MED ORDER — CHLORHEXIDINE GLUCONATE 0.12 % MT SOLN
15.0000 mL | Freq: Two times a day (BID) | OROMUCOSAL | Status: DC
  Administered 2014-03-01 – 2014-03-07 (×13): 15 mL via OROMUCOSAL
  Filled 2014-03-01 (×13): qty 15

## 2014-03-01 MED ORDER — SODIUM CHLORIDE 0.9 % IV BOLUS (SEPSIS)
1000.0000 mL | Freq: Once | INTRAVENOUS | Status: AC
  Administered 2014-03-01: 1000 mL via INTRAVENOUS

## 2014-03-01 MED ORDER — ZIPRASIDONE MESYLATE 20 MG IM SOLR
20.0000 mg | Freq: Once | INTRAMUSCULAR | Status: AC
  Administered 2014-03-01: 20 mg via INTRAMUSCULAR
  Filled 2014-03-01: qty 20

## 2014-03-01 MED ORDER — SUCCINYLCHOLINE CHLORIDE 20 MG/ML IJ SOLN
INTRAMUSCULAR | Status: AC | PRN
  Administered 2014-03-01: 150 mg via INTRAVENOUS
  Administered 2014-03-01: 120 mg via INTRAVENOUS

## 2014-03-01 MED ORDER — SODIUM CHLORIDE 0.9 % IV SOLN
1000.0000 mL | Freq: Once | INTRAVENOUS | Status: AC
  Administered 2014-03-01: 1000 mL via INTRAVENOUS

## 2014-03-01 MED ORDER — ROCURONIUM BROMIDE 50 MG/5ML IV SOLN
INTRAVENOUS | Status: AC
  Filled 2014-03-01: qty 2

## 2014-03-01 MED ORDER — LORAZEPAM 2 MG/ML IJ SOLN
INTRAMUSCULAR | Status: AC
  Filled 2014-03-01: qty 1

## 2014-03-01 MED ORDER — DOCUSATE SODIUM 50 MG/5ML PO LIQD
100.0000 mg | Freq: Two times a day (BID) | ORAL | Status: DC | PRN
  Filled 2014-03-01: qty 10

## 2014-03-01 MED ORDER — ZIPRASIDONE MESYLATE 20 MG IM SOLR
10.0000 mg | Freq: Once | INTRAMUSCULAR | Status: AC
  Administered 2014-03-01: 10 mg via INTRAMUSCULAR
  Filled 2014-03-01: qty 20

## 2014-03-01 MED ORDER — ETOMIDATE 2 MG/ML IV SOLN
INTRAVENOUS | Status: AC
  Filled 2014-03-01: qty 20

## 2014-03-01 MED ORDER — FUROSEMIDE 10 MG/ML IJ SOLN
40.0000 mg | Freq: Once | INTRAMUSCULAR | Status: AC
  Administered 2014-03-01: 40 mg via INTRAVENOUS
  Filled 2014-03-01: qty 4

## 2014-03-01 MED ORDER — BACLOFEN 5 MG HALF TABLET: 5.0000 mg | ORAL_TABLET | Freq: Three times a day (TID) | ORAL | Status: DC

## 2014-03-01 MED ORDER — VANCOMYCIN HCL IN DEXTROSE 1-5 GM/200ML-% IV SOLN
1000.0000 mg | Freq: Three times a day (TID) | INTRAVENOUS | Status: DC
  Administered 2014-03-01 – 2014-03-03 (×7): 1000 mg via INTRAVENOUS
  Filled 2014-03-01 (×8): qty 200

## 2014-03-01 MED ORDER — PROPOFOL 10 MG/ML IV EMUL
5.0000 ug/kg/min | Freq: Once | INTRAVENOUS | Status: DC
Start: 2014-03-01 — End: 2014-03-01
  Administered 2014-03-01: 15 ug/kg/min via INTRAVENOUS

## 2014-03-01 MED ORDER — LORAZEPAM 2 MG/ML IJ SOLN
1.0000 mg | Freq: Once | INTRAMUSCULAR | Status: AC
  Administered 2014-03-01: 1 mg via INTRAVENOUS

## 2014-03-01 MED ORDER — BISACODYL 10 MG RE SUPP: 10.0000 mg | Freq: Every day | RECTAL | Status: DC | PRN

## 2014-03-01 MED ORDER — PANTOPRAZOLE SODIUM 40 MG PO PACK
40.0000 mg | PACK | Freq: Two times a day (BID) | ORAL | Status: DC
  Administered 2014-03-01 – 2014-03-05 (×8): 40 mg
  Filled 2014-03-01 (×10): qty 20

## 2014-03-01 MED ORDER — HEPARIN SODIUM (PORCINE) 5000 UNIT/ML IJ SOLN
5000.0000 [IU] | Freq: Three times a day (TID) | INTRAMUSCULAR | Status: DC
  Administered 2014-03-01 – 2014-03-07 (×18): 5000 [IU] via SUBCUTANEOUS
  Filled 2014-03-01 (×21): qty 1

## 2014-03-01 MED ORDER — FENTANYL CITRATE 0.05 MG/ML IJ SOLN: 50.0000 ug | Freq: Once | INTRAMUSCULAR | Status: DC

## 2014-03-01 MED ORDER — ONDANSETRON HCL 4 MG/2ML IJ SOLN
INTRAMUSCULAR | Status: AC
  Administered 2014-03-01: 4 mg
  Filled 2014-03-01: qty 2

## 2014-03-01 MED ORDER — PROPOFOL 10 MG/ML IV EMUL: 5.0000 ug/kg/min | Freq: Once | INTRAVENOUS | Status: DC

## 2014-03-01 MED ORDER — PANTOPRAZOLE SODIUM 40 MG PO PACK
40.0000 mg | PACK | ORAL | Status: DC
  Administered 2014-03-01: 40 mg
  Filled 2014-03-01: qty 20

## 2014-03-01 MED ORDER — FENTANYL CITRATE 0.05 MG/ML IJ SOLN
50.0000 ug | INTRAMUSCULAR | Status: DC | PRN
  Administered 2014-03-01 – 2014-03-03 (×8): 50 ug via INTRAVENOUS
  Filled 2014-03-01 (×8): qty 2

## 2014-03-01 MED ORDER — PROPOFOL 10 MG/ML IV EMUL
INTRAVENOUS | Status: AC
  Filled 2014-03-01: qty 100

## 2014-03-01 MED ORDER — SODIUM CHLORIDE 0.9 % IV SOLN: 250.0000 mL | INTRAVENOUS | Status: DC | PRN

## 2014-03-01 MED ORDER — ETOMIDATE 2 MG/ML IV SOLN
INTRAVENOUS | Status: AC | PRN
  Administered 2014-03-01 (×2): 20 mg via INTRAVENOUS

## 2014-03-01 MED ORDER — HALOPERIDOL LACTATE 5 MG/ML IJ SOLN
5.0000 mg | Freq: Once | INTRAMUSCULAR | Status: AC
  Administered 2014-03-01: 5 mg via INTRAMUSCULAR
  Filled 2014-03-01: qty 1

## 2014-03-01 MED ORDER — FENTANYL BOLUS VIA INFUSION
50.0000 ug | INTRAVENOUS | Status: DC | PRN
  Filled 2014-03-01: qty 100

## 2014-03-01 MED ORDER — BISACODYL 5 MG PO TBEC
5.0000 mg | DELAYED_RELEASE_TABLET | Freq: Every day | ORAL | Status: DC | PRN
  Filled 2014-03-01: qty 1

## 2014-03-01 MED ORDER — CETYLPYRIDINIUM CHLORIDE 0.05 % MT LIQD
7.0000 mL | Freq: Four times a day (QID) | OROMUCOSAL | Status: DC
  Administered 2014-03-01 – 2014-03-07 (×22): 7 mL via OROMUCOSAL

## 2014-03-01 MED ORDER — SODIUM CHLORIDE 0.9 % IV SOLN: 1000.0000 mL | INTRAVENOUS | Status: DC

## 2014-03-01 MED ORDER — MIDAZOLAM HCL 2 MG/2ML IJ SOLN
1.0000 mg | INTRAMUSCULAR | Status: DC | PRN
  Administered 2014-03-01 – 2014-03-05 (×3): 1 mg via INTRAVENOUS
  Filled 2014-03-01 (×3): qty 2

## 2014-03-01 MED ORDER — SODIUM CHLORIDE 0.9 % IV SOLN: INTRAVENOUS | Status: DC

## 2014-03-01 MED ORDER — PANTOPRAZOLE SODIUM 40 MG IV SOLR: 40.0000 mg | Freq: Every day | INTRAVENOUS | Status: DC

## 2014-03-01 NOTE — Consult Note (Signed)
NEURO HOSPITALIST CONSULT NOTE    Reason for Consult: altered mental status.  HPI:                                                                                                                                          Randy Greene is an 48 y.o. male with a past medical history significant for MS on Tysabri, HTN, enlarged prostate, brought to [facility] today due to severe agitation after several days of confusion. Patient became extremely agitated when he woke up this morning and wife couldn't handle the situation and called EMS. Very combative in the ED despite haldol, ativan, and geodon. He eventually required heavy sedation and intubation. Reported to have tremor versus rigor/seizure like activity while in the NICU. A neurological consultation was requested to further address potential etiologies for his neurological status. Currently, he is sedated with propofol and Fentanyl, intubated on the vent. Work up so far reveals mild leukocytosis, negative UA, UDS positive for tetrahydrocannabinol, opiates, and benzodiazepines. CT brain showed no acute intracranial abnormality. Patient on multiple medications and reportedly sometimes self medicates for both sedation and for trying to get himself to wake up more. He also self medicates for phantom pain.    Past Medical History  Diagnosis Date  . MS (multiple sclerosis)   . Hypertension   . Enlarged prostate   . Acid reflux   . Aspiration pneumonia   . Hypercapnic respiratory failure, chronic     Past Surgical History  Procedure Laterality Date  . Hernia repair    . Arthroscopic      No family history on file.   Social History:  reports that he quit smoking about 7 years ago. His smoking use included Cigarettes. He smoked 1.00 pack per day. He has never used smokeless tobacco. He reports that he does not drink alcohol or use illicit drugs.  Allergies  Allergen Reactions  . Food Nausea And Vomiting    GREEN PEPPERS-  flushed  . Mushroom Extract Complex Nausea And Vomiting    And flushed  . Penicillins Hives    FAMILY HISTORY  . Adhesive [Tape] Rash    MEDICATIONS:  Scheduled: . baclofen  5 mg Oral BID   And  . baclofen  10 mg Oral QHS  . etomidate      . etomidate      . heparin  5,000 Units Subcutaneous 3 times per day  . lidocaine (cardiac) 100 mg/51m      . lidocaine (cardiac) 100 mg/594m     . pantoprazole sodium  40 mg Per Tube Q24H  . piperacillin-tazobactam (ZOSYN)  IV  3.375 g Intravenous Q8H  . propofol      . rocuronium      . rocuronium      . succinylcholine      . succinylcholine      . vancomycin  1,000 mg Intravenous Q8H     ROS:  Unable to obtain due to mental status                                                                                                                               History obtained from chart review    Physical exam: sedated, intubated on the vent. Blood pressure 114/62, pulse 45, temperature 99.1 F (37.3 C), temperature source Axillary, resp. rate 25, height _0  (1.778 m), weight 100.4 kg (221 lb 5.5 oz), SpO2 99.00%. Head: normocephalic. Neck: supple, no bruits, no JVD. Cardiac: no murmurs. Lungs: clear. Abdomen: soft, no tender, no mass. Extremities: no edema. Neurologic Examination:                                                                                                      Mental status: sedated with propofol and Fentanyl, but open eyes to painful stimuli. CN 2-12: pupils 1-1.5 mm, reactive. No gaze preference. Couldn't elicit corneal reflexes. EOM appropriate. No nystagmus. No frank facial weakness. Tongue: intubated. Motor: moves upper extremities upon painful stimuli. Sensory: reacts to pain. DTR's: 1 all over. Plantars: upgoing. Coordination and gait: unable to test.  No results found for this basename:  cbc, bmp, coags, chol, tri, ldl, hga1c    Results for orders placed during the hospital encounter of 03/01/14 (from the past 48 hour(s))  CBC WITH DIFFERENTIAL     Status: Abnormal   Collection Time    03/01/14  2:49 AM      Result Value Ref Range   WBC 16.4 (*) 4.0 - 10.5 K/uL   RBC 4.09 (*) 4.22 - 5.81 MIL/uL   Hemoglobin 12.9 (*) 13.0 - 17.0 g/dL   HCT 38.1 (*) 39.0 - 52.0 %   MCV 93.2  78.0 - 100.0 fL   MCH 31.5  26.0 - 34.0 pg   MCHC 33.9  30.0 - 36.0 g/dL   RDW 96.4  18.9 - 37.3 %   Platelets 197  150 - 400 K/uL   Neutrophils Relative % 76  43 - 77 %   Neutro Abs 12.4 (*) 1.7 - 7.7 K/uL   Lymphocytes Relative 12  12 - 46 %   Lymphs Abs 2.0  0.7 - 4.0 K/uL   Monocytes Relative 8  3 - 12 %   Monocytes Absolute 1.3 (*) 0.1 - 1.0 K/uL   Eosinophils Relative 4  0 - 5 %   Eosinophils Absolute 0.6  0.0 - 0.7 K/uL   Basophils Relative 0  0 - 1 %   Basophils Absolute 0.1  0.0 - 0.1 K/uL  COMPREHENSIVE METABOLIC PANEL     Status: Abnormal   Collection Time    03/01/14  2:49 AM      Result Value Ref Range   Sodium 144  137 - 147 mEq/L   Potassium 3.6 (*) 3.7 - 5.3 mEq/L   Chloride 100  96 - 112 mEq/L   CO2 22  19 - 32 mEq/L   Glucose, Bld 155 (*) 70 - 99 mg/dL   BUN 9  6 - 23 mg/dL   Creatinine, Ser 7.49  0.50 - 1.35 mg/dL   Calcium 66.4  8.4 - 66.0 mg/dL   Total Protein 7.7  6.0 - 8.3 g/dL   Albumin 4.4  3.5 - 5.2 g/dL   AST 26  0 - 37 U/L   ALT 28  0 - 53 U/L   Alkaline Phosphatase 78  39 - 117 U/L   Total Bilirubin 0.7  0.3 - 1.2 mg/dL   GFR calc non Af Amer >90  >90 mL/min   GFR calc Af Amer >90  >90 mL/min   Comment: (NOTE)     The eGFR has been calculated using the CKD EPI equation.     This calculation has not been validated in all clinical situations.     eGFR's persistently <90 mL/min signify possible Chronic Kidney     Disease.   Anion gap 22 (*) 5 - 15   Comment: RESULT CHECKED  CK     Status: None   Collection Time    03/01/14  2:49 AM      Result Value  Ref Range   Total CK 224  7 - 232 U/L  I-STAT CG4 LACTIC ACID, ED     Status: Abnormal   Collection Time    03/01/14  2:55 AM      Result Value Ref Range   Lactic Acid, Venous 7.24 (*) 0.5 - 2.2 mmol/L  URINALYSIS, ROUTINE W REFLEX MICROSCOPIC     Status: Abnormal   Collection Time    03/01/14  2:58 AM      Result Value Ref Range   Color, Urine AMBER (*) YELLOW   Comment: BIOCHEMICALS MAY BE AFFECTED BY COLOR   APPearance CLEAR  CLEAR   Specific Gravity, Urine 1.027  1.005 - 1.030   pH 6.5  5.0 - 8.0   Glucose, UA NEGATIVE  NEGATIVE mg/dL   Hgb urine dipstick SMALL (*) NEGATIVE   Bilirubin Urine SMALL (*) NEGATIVE   Ketones, ur 40 (*) NEGATIVE mg/dL   Protein, ur 30 (*) NEGATIVE mg/dL   Urobilinogen, UA 0.2  0.0 - 1.0 mg/dL   Nitrite NEGATIVE  NEGATIVE   Leukocytes, UA NEGATIVE  NEGATIVE  URINE RAPID DRUG SCREEN (HOSP PERFORMED)     Status: Abnormal   Collection Time    03/01/14  2:58 AM      Result Value Ref Range   Opiates POSITIVE (*) NONE DETECTED   Cocaine NONE DETECTED  NONE DETECTED   Benzodiazepines POSITIVE (*) NONE DETECTED   Amphetamines POSITIVE (*) NONE DETECTED   Tetrahydrocannabinol POSITIVE (*) NONE DETECTED   Barbiturates NONE DETECTED  NONE DETECTED   Comment:            DRUG SCREEN FOR MEDICAL PURPOSES     ONLY.  IF CONFIRMATION IS NEEDED     FOR ANY PURPOSE, NOTIFY LAB     WITHIN 5 DAYS.                LOWEST DETECTABLE LIMITS     FOR URINE DRUG SCREEN     Drug Class       Cutoff (ng/mL)     Amphetamine      1000     Barbiturate      200     Benzodiazepine   200     Tricyclics       300     Opiates          300     Cocaine          300     THC              50  URINE MICROSCOPIC-ADD ON     Status: Abnormal   Collection Time    03/01/14  2:58 AM      Result Value Ref Range   WBC, UA 0-2  <3 WBC/hpf   RBC / HPF 11-20  <3 RBC/hpf   Bacteria, UA RARE  RARE   Casts HYALINE CASTS (*) NEGATIVE   Urine-Other MUCOUS PRESENT    MRSA PCR SCREENING      Status: None   Collection Time    03/01/14  7:25 AM      Result Value Ref Range   MRSA by PCR NEGATIVE  NEGATIVE   Comment:            The GeneXpert MRSA Assay (FDA     approved for NASAL specimens     only), is one component of a     comprehensive MRSA colonization     surveillance program. It is not     intended to diagnose MRSA     infection nor to guide or     monitor treatment for     MRSA infections.  POCT I-STAT 3, ART BLOOD GAS (G3+)     Status: Abnormal   Collection Time    03/01/14  8:07 AM      Result Value Ref Range   pH, Arterial 7.385  7.350 - 7.450   pCO2 arterial 45.7 (*) 35.0 - 45.0 mmHg   pO2, Arterial 50.0 (*) 80.0 - 100.0 mmHg   Bicarbonate 27.3 (*) 20.0 - 24.0 mEq/L   TCO2 29  0 - 100 mmol/L   O2 Saturation 84.0     Acid-Base Excess 2.0  0.0 - 2.0 mmol/L   Patient temperature 98.6 F     Collection site RADIAL, ALLEN'S TEST ACCEPTABLE     Drawn by RT     Sample type ARTERIAL      Ct Head Wo Contrast  03/01/2014   CLINICAL DATA:  Altered mental status.  EXAM: CT HEAD WITHOUT CONTRAST  TECHNIQUE: Contiguous  axial images were obtained from the base of the skull through the vertex without intravenous contrast.  COMPARISON:  Head CT scan 11/28/2012.  FINDINGS: No evidence of acute intracranial abnormality including hemorrhage, infarct, mass lesion, mass effect, midline shift or abnormal extra-axial fluid collection is identified. There is no hydrocephalus or pneumocephalus. NG tube is in place.  There is marked mucosal thickening in the maxillary sinuses, worse on the left. There is some high attenuation material within the left maxillary sinus suggesting chronic change or less likely fungal sinusitis. Extensive ethmoid air cell disease also identified. Mild mucosal thickening in the frontal sinuses is noted.  IMPRESSION: No acute intracranial abnormality.  Extensive sinus disease. High attenuation material within the left frontal sinus likely reflects chronic change  rather than fungal sinusitis.   Electronically Signed   By: Inge Rise M.D.   On: 03/01/2014 07:20   Dg Chest Port 1 View  03/01/2014   CLINICAL DATA:  Central line placement.  EXAM: PORTABLE CHEST - 1 VIEW  COMPARISON:  Single view of the chest 03/01/2014 and 6:07 a.m.  FINDINGS: New left IJ catheter is in place with tip projecting over the lower superior vena cava. No pneumothorax. Endotracheal tube and NG tube are again seen. There are new bibasilar airspace opacities. Heart size is normal.  IMPRESSION: Left IJ catheter tip projects in the lower superior vena cava. Negative for pneumothorax.  New bibasilar airspace opacities, likely atelectasis.   Electronically Signed   By: Inge Rise M.D.   On: 03/01/2014 10:16   Dg Chest Portable 1 View  03/01/2014   CLINICAL DATA:  Assess endotracheal tube.  EXAM: PORTABLE CHEST - 1 VIEW  COMPARISON:  03/01/2014  FINDINGS: Endotracheal tube placed with tip measuring 6.9 cm above the carinal. Enteric tube tip is not well visualized but appears to be below the left hemidiaphragm. Shallow inspiration. Mild cardiac enlargement with prominent central pulmonary vascularity suggesting congestion. No edema or consolidation. No blunting of costophrenic angles. No pneumothorax.  IMPRESSION: Appliances appear to be in satisfactory location. Cardiac enlargement with pulmonary vascular congestion. No definite edema.   Electronically Signed   By: Lucienne Capers M.D.   On: 03/01/2014 06:24   Dg Chest Portable 1 View  03/01/2014   CLINICAL DATA:  Altered mental status, shortness of breath.  EXAM: PORTABLE CHEST - 1 VIEW  COMPARISON:  Chest radiograph January 01, 2014  FINDINGS: Moderate cardiomegaly, increased from prior examination. Central pulmonary vascular congestion and mild interstitial prominence without pleural effusions or focal consolidations. Trachea projects midline and there is no pneumothorax. Soft tissue planes and included osseous structures are  nonsuspicious.  IMPRESSION: Increasing cardiomegaly with interstitial prominence consistent with pulmonary edema, no focal consolidation or pleural effusion.   Electronically Signed   By: Elon Alas   On: 03/01/2014 03:22   Dg Abd Portable 1v  03/01/2014   CLINICAL DATA:  Nasogastric tube placement  EXAM: PORTABLE ABDOMEN - 1 VIEW  COMPARISON:  None.  FINDINGS: Nasogastric tube tip and side port are in the region of the distal stomach. Visualized bowel gas pattern is unremarkable.  IMPRESSION: Nasogastric tube tip and side-port in region of distal stomach.   Electronically Signed   By: Lowella Grip M.D.   On: 03/01/2014 08:31   Assessment/Plan: 48 y/o with MS on disease modifying therapy with Tysabri, on multiple other medications, admitted to the NICU with agitated delirium of unclear etiology. Question of tremor/seizure/or rigors while in the unit. No obvious source of infection  identified and CT brain unremarkable for acute abnormality. Polypharmacy can certainly be a potential etiology. Recommend MRI brain with and without contrast. EEG. Will follow up.  Dorian Pod, MD 03/01/2014, 10:49 AM

## 2014-03-01 NOTE — Progress Notes (Signed)
Patient transported from CT to 8S12 without complications. Vital signs stable at this time. RT will continue to monitor.

## 2014-03-01 NOTE — Procedures (Signed)
Central Venous Catheter Insertion Procedure Note Randy Greene [id number] 06/13/1966  Procedure: Insertion of Central Venous Catheter Indications: Drug and/or fluid administration  Procedure Details Consent: Unable to obtain consent because of altered level of consciousness. Time Out: Verified patient identification, verified procedure, site/side was marked, verified correct patient position, special equipment/implants available, medications/allergies/relevent history reviewed, required imaging and test results available.  Performed  Maximum sterile technique was used including antiseptics, cap, gloves, gown, hand hygiene, mask and sheet. Skin prep: Chlorhexidine; local anesthetic administered A antimicrobial bonded/coated triple lumen catheter was placed in the left internal jugular vein using the Seldinger technique.  Evaluation Blood flow good Complications: No apparent complications Patient did tolerate procedure well. Chest X-ray ordered to verify placement.  CXR: pending.  Performed by Noe Gens, NP using ultrasound guidance.  I was present for procedure.  Chesley Mires, MD [facility] Pulmonary/Critical Care 03/01/2014, 9:46 AM Pager:  [phone] After 3pm call: [phone]

## 2014-03-01 NOTE — Progress Notes (Signed)
INITIAL NUTRITION ASSESSMENT  DOCUMENTATION CODES Per approved criteria  -Obesity Unspecified   INTERVENTION:  Recommend Vital HP @ 25 ml/hr via OGT and continue goal rate of 25 ml/hr.   60 ml Prostat BID.    Tube feeding regimen provides 1000 kcals, 112.5 grams of protein (75% of needs), and 502 ml of H2O.   TF regimen plus current Propofol infusion will meet 91% of kcal needs. Unable to meet 100% of estimated protein needs with Propofol use.  Will continue to monitor  NUTRITION DIAGNOSIS: Inadequate oral intake related to inability to eat as evidenced by NPO status.   Goal: Enteral nutrition to provide 60-70% of estimated calorie needs (22-25 kcals/kg ideal body weight) and 100% of estimated protein needs, based on ASPEN guidelines for permissive underfeeding in critically ill obese individuals  Monitor:  TF regimen & tolerance, respiratory status, weight, labs, I/O's   Reason for Assessment: Consult received to assess and provide recommendations for enteral nutrition support.  Admitting Dx: confusion  ASSESSMENT: 48 y/o male with multiple sclerosis brought to [facility] ED on 9/10 for severe agitation after several days of confusion. He eventually required heavy sedation and intubation.  Per RN, pt will stay on Propofol for today and TF will not begin until possibly tomorrow. Recommendations provided.  Patient is currently intubated on ventilator support d/t acute respiratory failure 2/2 altered mental status/airway protection MV: 10.6 L/min Temp (24hrs), Avg:99 F (37.2 C), Min:98.8 F (37.1 C), Max:99.1 F (37.3 C)  Propofol: 36 ml/hr, providing 950 fat kcal  Nutrition focused physical exam shows no sign of depletion of muscle mass or body fat.  Labs reviewed:  Low K Glucose 155  Height: Ht Readings from Last 1 Encounters:  03/01/14 5\' 10"  (1.778 m)    Weight: Wt Readings from Last 1 Encounters:  03/01/14 221 lb 5.5 oz (100.4 kg)    Ideal Body Weight: 166  lb  % Ideal Body Weight: 133%  Wt Readings from Last 10 Encounters:  03/01/14 221 lb 5.5 oz (100.4 kg)  01/02/14 225 lb 5 oz (102.2 kg)  04/10/13 215 lb 6.2 oz (97.7 kg)  11/29/12 205 lb (92.987 kg)    Usual Body Weight: variable  % Usual Body Weight: NA  BMI:  Body mass index is 31.76 kg/(m^2).  Estimated Nutritional Needs: Kcal: 2150 Protein: 150-160g Fluid: 2.1L/day  Skin: intact  Diet Order: NPO  EDUCATION NEEDS: -No education needs identified at this time   Intake/Output Summary (Last 24 hours) at 03/01/14 0913 Last data filed at 03/01/14 0842  Gross per 24 hour  Intake 140.05 ml  Output    150 ml  Net  -9.95 ml    Last BM: PTA   Labs:   Recent Labs Lab 03/01/14 0249  NA 144  K 3.6*  CL 100  CO2 22  BUN 9  CREATININE 0.85  CALCIUM 10.0  GLUCOSE 155*    CBG (last 3)  No results found for this basename: GLUCAP,  in the last 72 hours  Scheduled Meds: . baclofen  5 mg Oral BID   And  . baclofen  10 mg Oral QHS  . etomidate      . etomidate      . heparin  5,000 Units Subcutaneous 3 times per day  . lidocaine (cardiac) 100 mg/31ml      . lidocaine (cardiac) 100 mg/34ml      . pantoprazole sodium  40 mg Per Tube Q24H  . piperacillin-tazobactam (ZOSYN)  IV  3.375  g Intravenous Q8H  . propofol      . rocuronium      . rocuronium      . succinylcholine      . succinylcholine      . vancomycin  1,000 mg Intravenous Q8H    Continuous Infusions: . sodium chloride 50 mL/hr at 03/01/14 0842  . propofol 50 mcg/kg/min (03/01/14 0800)    Past Medical History  Diagnosis Date  . MS (multiple sclerosis)   . Hypertension   . Enlarged prostate   . Acid reflux   . Aspiration pneumonia   . Hypercapnic respiratory failure, chronic     Past Surgical History  Procedure Laterality Date  . Hernia repair    . Arthroscopic      Clayton Bibles, MS, [place] Licensed Dietitian Nutritionist Pager: [phone]

## 2014-03-01 NOTE — Progress Notes (Signed)
Pt. Transported from ED to 3M06 uneventfully, RT to monitor.

## 2014-03-01 NOTE — H&P (Addendum)
PULMONARY / CRITICAL CARE MEDICINE   Name: Randy Greene MRN: [id number] DOB: Dec 28, 1965    ADMISSION DATE:  03/01/2014 CONSULTATION DATE:  03/01/2014  REFERRING MD :  Roxanne Mins, EDP  CHIEF COMPLAINT:  Confusion  INITIAL PRESENTATION: 48 y/o male with multiple sclerosis brought to [facility] ED on 9/10 for severe agitation after several days of confusion.  Initial impression was that confusion came from polypharmacy.  His agitation escalated quickly to combativeness in the ED despite haldol, ativan, and geodon.  He eventually required heavy sedation and intubation.  STUDIES:  9/10 CT head >> chronic sinus disease, no other acute process  SIGNIFICANT EVENTS: 9/10 Intubation/admission   HISTORY OF PRESENT ILLNESS:  48 y/o male with multiple sclerosis brought to [facility] ED on 9/10 for severe agitation after several days of confusion.  Initial impression was that confusion came from polypharmacy.  His agitation escalated quickly to combativeness in the ED despite haldol, ativan, and geodon.  He eventually required heavy sedation and intubation.  No further history could be obtained because the patient was intubated prior to our arrival.   PAST MEDICAL HISTORY :  Past Medical History  Diagnosis Date  . MS (multiple sclerosis)   . Hypertension   . Enlarged prostate   . Acid reflux   . Aspiration pneumonia   . Hypercapnic respiratory failure, chronic     Past Surgical History  Procedure Laterality Date  . Hernia repair    . Arthroscopic      Prior to Admission medications   Medication Sig Start Date End Date Taking? Authorizing Provider  albuterol (PROVENTIL HFA;VENTOLIN HFA) 108 (90 BASE) MCG/ACT inhaler Inhale 2 puffs into the lungs every 6 (six) hours as needed for wheezing or shortness of breath.    Historical Provider, MD  ALPRAZolam Duanne Moron) 1 MG tablet Take 0.5-1 mg by mouth at bedtime as needed for anxiety.     Historical Provider, MD  atenolol (TENORMIN) 50 MG tablet Take 50 mg by mouth  every morning.    Historical Provider, MD  atorvastatin (LIPITOR) 40 MG tablet Take 20 mg by mouth at bedtime.    Historical Provider, MD  B COMPLEX VITAMINS SL Place 1 tablet under the tongue daily.    Historical Provider, MD  baclofen (LIORESAL) 10 MG tablet Take 5-10 mg by mouth 3 (three) times daily. Take  5mg  by mouth twice daily and take 10mg  at bedtime    Historical Provider, MD  Calcium Carbonate 500 MG CHEW Chew 3 tablets by mouth 2 (two) times daily as needed (for heartburn).     Historical Provider, MD  cetirizine (ZYRTEC) 10 MG tablet Take 10 mg by mouth daily.     Historical Provider, MD  dalfampridine (AMPYRA) 10 MG TB12 Take 10 mg by mouth 2 (two) times daily.    Historical Provider, MD  diclofenac sodium (VOLTAREN) 1 % GEL Apply 2 g topically 2 (two) times daily as needed (to pain).    Historical Provider, MD  donepezil (ARICEPT) 10 MG tablet Take 10 mg by mouth at bedtime.     Historical Provider, MD  doxazosin (CARDURA) 4 MG tablet Take 2 mg by mouth at bedtime.    Historical Provider, MD  dronabinol (MARINOL) 2.5 MG capsule Take 2.5 mg by mouth 4 (four) times daily.     Historical Provider, MD  ergocalciferol (DRISDOL) 8000 UNIT/ML drops Take 4,000 Units by mouth daily.     Historical Provider, MD  gabapentin (NEURONTIN) 400 MG capsule Take 400 mg by mouth  4 (four) times daily.    Historical Provider, MD  imiquimod (ALDARA) 5 % cream Apply 1 application topically 3 (three) times a week.    Historical Provider, MD  lactobacillus acidophilus (BACID) TABS Take 2 tablets by mouth 3 (three) times daily.     Historical Provider, MD  levofloxacin (LEVAQUIN) 750 MG tablet Take 1 tablet (750 mg total) by mouth daily. 01/03/14   Wilber Oliphant, MD  lidocaine (LIDODERM) 5 % Place 1 patch onto the skin daily as needed (for pain). Remove & Discard patch within 12 hours or as directed by MD    Historical Provider, MD  methylphenidate (RITALIN) 10 MG tablet Take 10 mg by mouth 2 (two) times  daily with breakfast and lunch. Take one tablet in the morning and at noon    Historical Provider, MD  metoCLOPramide (REGLAN) 10 MG tablet Take 10 mg by mouth at bedtime.    Historical Provider, MD  modafinil (PROVIGIL) 200 MG tablet Take 200 mg by mouth 2 (two) times daily. Take one tablet in the morning and at noon. Take with ritalin    Historical Provider, MD  Multiple Vitamin (MULTIVITAMIN WITH MINERALS) TABS tablet Take 1 tablet by mouth daily.    Historical Provider, MD  NALTREXONE HCL PO Take 4.5 mg by mouth at bedtime. Recommended dose is 4.5mg  daily.  Use a 50mg  pill and cut in 1/2 (25mg ).  Cut 1/2 pill into 6 pieces (4.17mg ) or 5 pieces (5mg ).    Historical Provider, MD  naproxen (NAPROSYN) 500 MG tablet Take 500 mg by mouth 2 (two) times daily as needed (pain). Take with food.    Historical Provider, MD  Natalizumab (TYSABRI IV) Inject into the vein every 30 (thirty) days. Around the fourth week of each month    Historical Provider, MD  ondansetron (ZOFRAN) 8 MG tablet Take 8 mg by mouth every 8 (eight) hours as needed for nausea.    Historical Provider, MD  oxycodone (OXY-IR) 5 MG capsule Take 10 mg by mouth 4 (four) times daily as needed for pain.     Historical Provider, MD  pantoprazole (PROTONIX) 40 MG tablet Take 40 mg by mouth 2 (two) times daily.    Historical Provider, MD  potassium chloride (K-DUR,KLOR-CON) 10 MEQ tablet Take 10 mEq by mouth daily.    Historical Provider, MD  ranitidine (ZANTAC) 150 MG tablet Take 150 mg by mouth 2 (two) times daily.    Historical Provider, MD  rOPINIRole (REQUIP) 0.5 MG tablet Take 0.5 mg by mouth at bedtime. Take 1 tablet 1 hr before bedtime    Historical Provider, MD  rosuvastatin (CRESTOR) 10 MG tablet Take 5 mg by mouth at bedtime.     Historical Provider, MD  sildenafil (VIAGRA) 100 MG tablet Take 100 mg by mouth as needed for erectile dysfunction.     Historical Provider, MD   Allergies  Allergen Reactions  . Food Nausea And Vomiting     GREEN PEPPERS- flushed  . Mushroom Extract Complex Nausea And Vomiting    And flushed  . Penicillins Hives    FAMILY HISTORY  . Adhesive [Tape] Rash    FAMILY HISTORY:  Unable to obtain.  SOCIAL HISTORY:  reports that he quit smoking about 7 years ago. His smoking use included Cigarettes. He smoked 1.00 pack per day. He has never used smokeless tobacco. He reports that he does not drink alcohol or use illicit drugs.  REVIEW OF SYSTEMS:  Cannot obtain due to intubation  SUBJECTIVE:   VITAL SIGNS: Temp:  [98.8 F (37.1 C)-99.1 F (37.3 C)] 99.1 F (37.3 C) (09/10 0721) Pulse Rate:  [45-138] 45 (09/10 0721) Resp:  [14-40] 25 (09/10 0721) BP: (114-158)/(46-132) 114/62 mmHg (09/10 0721) SpO2:  [86 %-100 %] 99 % (09/10 0721) FiO2 (%):  [50 %-60 %] 60 % (09/10 0721) Weight:  [221 lb 5.5 oz (100.4 kg)-225 lb (102.059 kg)] 221 lb 5.5 oz (100.4 kg) (09/10 0721) VENTILATOR SETTINGS: Vent Mode:  [-] PRVC FiO2 (%):  [50 %-60 %] 60 % Set Rate:  [15 bmp] 15 bmp Vt Set:  [600 mL] 600 mL PEEP:  [5 cmH20] 5 cmH20 INTAKE / OUTPUT:  Intake/Output Summary (Last 24 hours) at 03/01/14 0736 Last data filed at 03/01/14 0555  Gross per 24 hour  Intake      0 ml  Output      0 ml  Net      0 ml    PHYSICAL EXAMINATION: General: no distress Neuro:  RASS -3, moves extremities with stimulation HEENT:  Pupils reactive, no nuchal rigidity Cardiovascular:  Regular, no murmur Lungs:  B/l rhonchi Abdomen:  Soft, non tender, + bowel sounds Musculoskeletal:  No edema Skin:  No rashes  LABS:  CBC  Recent Labs Lab 03/01/14 0249  WBC 16.4*  HGB 12.9*  HCT 38.1*  PLT 197   BMET  Recent Labs Lab 03/01/14 0249  NA 144  K 3.6*  CL 100  CO2 22  BUN 9  CREATININE 0.85  GLUCOSE 155*   Electrolytes  Recent Labs Lab 03/01/14 0249  CALCIUM 10.0   Sepsis Markers  Recent Labs Lab 03/01/14 0255  LATICACIDVEN 7.24*   ABG No results found for this basename: PHART, PCO2ART,  PO2ART,  in the last 168 hours Liver Enzymes  Recent Labs Lab 03/01/14 0249  AST 26  ALT 28  ALKPHOS 78  BILITOT 0.7  ALBUMIN 4.4   Cardiac Enzymes No results found for this basename: TROPONINI, PROBNP,  in the last 168 hours  Glucose No results found for this basename: GLUCAP,  in the last 168 hours  Imaging Ct Head Wo Contrast  03/01/2014   CLINICAL DATA:  Altered mental status.  EXAM: CT HEAD WITHOUT CONTRAST  TECHNIQUE: Contiguous axial images were obtained from the base of the skull through the vertex without intravenous contrast.  COMPARISON:  Head CT scan 11/28/2012.  FINDINGS: No evidence of acute intracranial abnormality including hemorrhage, infarct, mass lesion, mass effect, midline shift or abnormal extra-axial fluid collection is identified. There is no hydrocephalus or pneumocephalus. NG tube is in place.  There is marked mucosal thickening in the maxillary sinuses, worse on the left. There is some high attenuation material within the left maxillary sinus suggesting chronic change or less likely fungal sinusitis. Extensive ethmoid air cell disease also identified. Mild mucosal thickening in the frontal sinuses is noted.  IMPRESSION: No acute intracranial abnormality.  Extensive sinus disease. High attenuation material within the left frontal sinus likely reflects chronic change rather than fungal sinusitis.   Electronically Signed   By: Inge Rise M.D.   On: 03/01/2014 07:20   Dg Chest Portable 1 View  03/01/2014   CLINICAL DATA:  Assess endotracheal tube.  EXAM: PORTABLE CHEST - 1 VIEW  COMPARISON:  03/01/2014  FINDINGS: Endotracheal tube placed with tip measuring 6.9 cm above the carinal. Enteric tube tip is not well visualized but appears to be below the left hemidiaphragm. Shallow inspiration. Mild cardiac enlargement with prominent central  pulmonary vascularity suggesting congestion. No edema or consolidation. No blunting of costophrenic angles. No pneumothorax.   IMPRESSION: Appliances appear to be in satisfactory location. Cardiac enlargement with pulmonary vascular congestion. No definite edema.   Electronically Signed   By: Lucienne Capers M.D.   On: 03/01/2014 06:24   Dg Chest Portable 1 View  03/01/2014   CLINICAL DATA:  Altered mental status, shortness of breath.  EXAM: PORTABLE CHEST - 1 VIEW  COMPARISON:  Chest radiograph January 01, 2014  FINDINGS: Moderate cardiomegaly, increased from prior examination. Central pulmonary vascular congestion and mild interstitial prominence without pleural effusions or focal consolidations. Trachea projects midline and there is no pneumothorax. Soft tissue planes and included osseous structures are nonsuspicious.  IMPRESSION: Increasing cardiomegaly with interstitial prominence consistent with pulmonary edema, no focal consolidation or pleural effusion.   Electronically Signed   By: Elon Alas   On: 03/01/2014 03:22     ASSESSMENT / PLAN:  NEUROLOGIC A:   Acute encephalopathy with combative behavior > suspect polypharmacy but etiology uncertain. Hx of multiple sclerosis. P:   RASS goal: -2 PAD 3 Hold outpt xanax, ampyra, tysabri, aricept, marinol, neurontin, ritalin, provigil, oxycodone, requip Continue baclofen to prevent withdrawal Will consult neurology  PULMONARY OETT 9/10 >> A:  Acute respiratory failure 2nd to altered mental status/airway protection. Hx of chronic hypercapnic respiratory failure, chronic aspiration. P:  Full vent support F/u CXR, ABG Adjust oxygen to keep SpO2 > 92%  CARDIOVASCULAR A:  Sinus tachycardia from agitation >> improved with sedation. Hx of HTN, hyperlipidemia. P:  Monitor hemodynamics Hold outpt tenormin Crestor, lipitor in outpt med list >> hold both Check Echo Lasix 40 mg IV x 1 on 9/10  RENAL A:   Hypokalemia. Hx of BPH. P:   F/u and replace electrolytes as needed Monitor renal fx, urine outpt Hold cardura  GASTROINTESTINAL A:    Nutrition. Hx of GERD. P:   NPO Protonix for SUP Tube feeds if unable to extubate soon  HEMATOLOGIC A:   Leukocytosis likely from acute stress >> no obvious infection. P:  F/u CBC SQ heparin for DVT prevention  INFECTIOUS A:   No clear sign of infection. P:   Monitor clinically  Blood 9/10 >> Urine 9/10 >> Sputum 9/10 >>   ENDOCRINE A:   No acute issues. P:   Monitor CBG's   TODAY'S SUMMARY:  48 yo with acute encephalopathy likely 2nd to polypharmacy and self medication in setting of MS.  Will ask neurology to assess.  Continue full vent support until mental status more stable.  CC time 50 minutes.  Chesley Mires, MD [facility] Pulmonary/Critical Care 03/01/2014, 7:43 AM Pager:  [phone] After 3pm call: [phone]

## 2014-03-01 NOTE — ED Provider Notes (Addendum)
CSN: [id number]     Arrival date & time 03/01/14  0230 History   First MD Initiated Contact with Patient 03/01/14 0254     Chief Complaint  Patient presents with  . Altered Mental Status     (Consider location/radiation/quality/duration/timing/severity/associated sxs/prior Treatment) Patient is a 48 y.o. male presenting with altered mental status. The history is provided by the spouse. The history is limited by the condition of the patient (Altered mental status).  Altered Mental Status He woke up tonight with severe agitation. His wife states that he had had some decreased mentation over the last several days but this is not unusual for him and because of underlying multiple sclerosis. He sometimes self medicates for both sedation and for trying to get himself to wake up more. He also self medicates for phantom pain. The change in mentation over the last several days was something within the range of what is normal for him. The agitation that was present when he woke up is very unusual for him. His wife was unable to control him so EMS was called and the police needed to apply handcuffs in order to keep him transported. He had been given but does limp and root with only slight improvement. He has not had any fever that is known. He did vomit at home. There's been no diarrhea that is known. He is on multiple medications but frequently will not take them as prescribed.  Past Medical History  Diagnosis Date  . MS (multiple sclerosis)   . Hypertension   . Enlarged prostate   . Acid reflux   . Aspiration pneumonia   . Hypercapnic respiratory failure, chronic    Past Surgical History  Procedure Laterality Date  . Hernia repair    . Arthroscopic     No family history on file. History  Substance Use Topics  . Smoking status: Former Smoker -- 1.00 packs/day    Types: Cigarettes    Quit date: 11/02/2006  . Smokeless tobacco: Never Used     Comment: 20 years  . Alcohol Use: No    Review  of Systems  Unable to perform ROS: Mental status change      Allergies  Food; Mushroom extract complex; Penicillins; and Adhesive  Home Medications   Prior to Admission medications   Medication Sig Start Date End Date Taking? Authorizing Provider  albuterol (PROVENTIL HFA;VENTOLIN HFA) 108 (90 BASE) MCG/ACT inhaler Inhale 2 puffs into the lungs every 6 (six) hours as needed for wheezing or shortness of breath.    Historical Provider, MD  ALPRAZolam Duanne Moron) 1 MG tablet Take 0.5-1 mg by mouth at bedtime as needed for anxiety.     Historical Provider, MD  atenolol (TENORMIN) 50 MG tablet Take 50 mg by mouth every morning.    Historical Provider, MD  atorvastatin (LIPITOR) 40 MG tablet Take 20 mg by mouth at bedtime.    Historical Provider, MD  B COMPLEX VITAMINS SL Place 1 tablet under the tongue daily.    Historical Provider, MD  baclofen (LIORESAL) 10 MG tablet Take 5-10 mg by mouth 3 (three) times daily. Take  5mg  by mouth twice daily and take 10mg  at bedtime    Historical Provider, MD  Calcium Carbonate 500 MG CHEW Chew 3 tablets by mouth 2 (two) times daily as needed (for heartburn).     Historical Provider, MD  cetirizine (ZYRTEC) 10 MG tablet Take 10 mg by mouth daily.     Historical Provider, MD  dalfampridine [facility]) 10  MG TB12 Take 10 mg by mouth 2 (two) times daily.    Historical Provider, MD  diclofenac sodium (VOLTAREN) 1 % GEL Apply 2 g topically 2 (two) times daily as needed (to pain).    Historical Provider, MD  donepezil (ARICEPT) 10 MG tablet Take 10 mg by mouth at bedtime.     Historical Provider, MD  doxazosin (CARDURA) 4 MG tablet Take 2 mg by mouth at bedtime.    Historical Provider, MD  dronabinol (MARINOL) 2.5 MG capsule Take 2.5 mg by mouth 4 (four) times daily.     Historical Provider, MD  ergocalciferol (DRISDOL) 8000 UNIT/ML drops Take 4,000 Units by mouth daily.     Historical Provider, MD  gabapentin (NEURONTIN) 400 MG capsule Take 400 mg by mouth 4 (four) times  daily.    Historical Provider, MD  imiquimod (ALDARA) 5 % cream Apply 1 application topically 3 (three) times a week.    Historical Provider, MD  lactobacillus acidophilus (BACID) TABS Take 2 tablets by mouth 3 (three) times daily.     Historical Provider, MD  levofloxacin (LEVAQUIN) 750 MG tablet Take 1 tablet (750 mg total) by mouth daily. 01/03/14   Wilber Oliphant, MD  lidocaine (LIDODERM) 5 % Place 1 patch onto the skin daily as needed (for pain). Remove & Discard patch within 12 hours or as directed by MD    Historical Provider, MD  methylphenidate (RITALIN) 10 MG tablet Take 10 mg by mouth 2 (two) times daily with breakfast and lunch. Take one tablet in the morning and at noon    Historical Provider, MD  metoCLOPramide (REGLAN) 10 MG tablet Take 10 mg by mouth at bedtime.    Historical Provider, MD  modafinil (PROVIGIL) 200 MG tablet Take 200 mg by mouth 2 (two) times daily. Take one tablet in the morning and at noon. Take with ritalin    Historical Provider, MD  Multiple Vitamin (MULTIVITAMIN WITH MINERALS) TABS tablet Take 1 tablet by mouth daily.    Historical Provider, MD  NALTREXONE HCL PO Take 4.5 mg by mouth at bedtime. Recommended dose is 4.5mg  daily.  Use a 50mg  pill and cut in 1/2 (25mg ).  Cut 1/2 pill into 6 pieces (4.17mg ) or 5 pieces (5mg ).    Historical Provider, MD  naproxen (NAPROSYN) 500 MG tablet Take 500 mg by mouth 2 (two) times daily as needed (pain). Take with food.    Historical Provider, MD  Natalizumab (TYSABRI IV) Inject into the vein every 30 (thirty) days. Around the fourth week of each month    Historical Provider, MD  ondansetron (ZOFRAN) 8 MG tablet Take 8 mg by mouth every 8 (eight) hours as needed for nausea.    Historical Provider, MD  oxycodone (OXY-IR) 5 MG capsule Take 10 mg by mouth 4 (four) times daily as needed for pain.     Historical Provider, MD  pantoprazole (PROTONIX) 40 MG tablet Take 40 mg by mouth 2 (two) times daily.    Historical Provider, MD   potassium chloride (K-DUR,KLOR-CON) 10 MEQ tablet Take 10 mEq by mouth daily.    Historical Provider, MD  ranitidine (ZANTAC) 150 MG tablet Take 150 mg by mouth 2 (two) times daily.    Historical Provider, MD  rOPINIRole (REQUIP) 0.5 MG tablet Take 0.5 mg by mouth at bedtime. Take 1 tablet 1 hr before bedtime    Historical Provider, MD  rosuvastatin (CRESTOR) 10 MG tablet Take 5 mg by mouth at bedtime.  Historical Provider, MD  sildenafil (VIAGRA) 100 MG tablet Take 100 mg by mouth as needed for erectile dysfunction.     Historical Provider, MD   Pulse 87  Resp 30  SpO2 100% Physical Exam  Nursing note and vitals reviewed.  48 year old male, who is agitated and diaphoretic. Vital signs are significant for tachypnea. Oxygen saturation is 100%, which is normal. Head is normocephalic and atraumatic. PERRLA, EOMI. Oropharynx is clear. Neck is nontender and supple without adenopathy or JVD. Back is nontender and there is no CVA tenderness. Lungs are clear without rales, wheezes, or rhonchi. Chest is nontender. Heart has regular rate and rhythm without murmur. Abdomen is soft, flat, nontender without masses or hepatosplenomegaly and peristalsis is normoactive. Extremities have no cyanosis or edema, full range of motion is present. Skin is mildly to moderately diaphoretic without rash. Neurologic: He is very agitated and does not answer questions although he will sometimes respond to voice, cranial nerves are intact, there are no gross motor deficits. Generalized increased motor tone is present.  ED Course  Procedures (including critical care time) INTUBATION Performed by: [facility]  Required items: required blood products, implants, devices, and special equipment available Patient identity confirmed: provided demographic data and hospital-assigned identification number Time out: Immediately prior to procedure a "time out" was called to verify the correct patient, procedure, equipment,  support staff and site/side marked as required.  Indications: Severe agitation which was unable to be controlled   Intubation method: Glidescope Laryngoscopy   Preoxygenation: BVM  Sedatives: Etomidate Paralytic: Succinylcholine  Tube Size: 7.5 cuffed  Post-procedure assessment: chest rise and ETCO2 monitor Breath sounds: equal and absent over the epigastrium Tube secured with: ETT holder Chest x-ray interpreted by radiologist and me.  Chest x-ray findings: endotracheal tube in appropriate position  Patient tolerated the procedure well with no immediate complications.    Labs Review Results for orders placed during the hospital encounter of 03/01/14  CBC WITH DIFFERENTIAL      Result Value Ref Range   WBC 16.4 (*) 4.0 - 10.5 K/uL   RBC 4.09 (*) 4.22 - 5.81 MIL/uL   Hemoglobin 12.9 (*) 13.0 - 17.0 g/dL   HCT 38.1 (*) 39.0 - 52.0 %   MCV 93.2  78.0 - 100.0 fL   MCH 31.5  26.0 - 34.0 pg   MCHC 33.9  30.0 - 36.0 g/dL   RDW 14.0  11.5 - 15.5 %   Platelets 197  150 - 400 K/uL   Neutrophils Relative % 76  43 - 77 %   Neutro Abs 12.4 (*) 1.7 - 7.7 K/uL   Lymphocytes Relative 12  12 - 46 %   Lymphs Abs 2.0  0.7 - 4.0 K/uL   Monocytes Relative 8  3 - 12 %   Monocytes Absolute 1.3 (*) 0.1 - 1.0 K/uL   Eosinophils Relative 4  0 - 5 %   Eosinophils Absolute 0.6  0.0 - 0.7 K/uL   Basophils Relative 0  0 - 1 %   Basophils Absolute 0.1  0.0 - 0.1 K/uL  COMPREHENSIVE METABOLIC PANEL      Result Value Ref Range   Sodium 144  137 - 147 mEq/L   Potassium 3.6 (*) 3.7 - 5.3 mEq/L   Chloride 100  96 - 112 mEq/L   CO2 22  19 - 32 mEq/L   Glucose, Bld 155 (*) 70 - 99 mg/dL   BUN 9  6 - 23 mg/dL   Creatinine, Ser  0.85  0.50 - 1.35 mg/dL   Calcium 10.0  8.4 - 10.5 mg/dL   Total Protein 7.7  6.0 - 8.3 g/dL   Albumin 4.4  3.5 - 5.2 g/dL   AST 26  0 - 37 U/L   ALT 28  0 - 53 U/L   Alkaline Phosphatase 78  39 - 117 U/L   Total Bilirubin 0.7  0.3 - 1.2 mg/dL   GFR calc non Af Amer >90   >90 mL/min   GFR calc Af Amer >90  >90 mL/min   Anion gap 22 (*) 5 - 15  CK      Result Value Ref Range   Total CK 224  7 - 232 U/L  URINALYSIS, ROUTINE W REFLEX MICROSCOPIC      Result Value Ref Range   Color, Urine AMBER (*) YELLOW   APPearance CLEAR  CLEAR   Specific Gravity, Urine 1.027  1.005 - 1.030   pH 6.5  5.0 - 8.0   Glucose, UA NEGATIVE  NEGATIVE mg/dL   Hgb urine dipstick SMALL (*) NEGATIVE   Bilirubin Urine SMALL (*) NEGATIVE   Ketones, ur 40 (*) NEGATIVE mg/dL   Protein, ur 30 (*) NEGATIVE mg/dL   Urobilinogen, UA 0.2  0.0 - 1.0 mg/dL   Nitrite NEGATIVE  NEGATIVE   Leukocytes, UA NEGATIVE  NEGATIVE  URINE RAPID DRUG SCREEN (HOSP PERFORMED)      Result Value Ref Range   Opiates POSITIVE (*) NONE DETECTED   Cocaine NONE DETECTED  NONE DETECTED   Benzodiazepines POSITIVE (*) NONE DETECTED   Amphetamines POSITIVE (*) NONE DETECTED   Tetrahydrocannabinol POSITIVE (*) NONE DETECTED   Barbiturates NONE DETECTED  NONE DETECTED  URINE MICROSCOPIC-ADD ON      Result Value Ref Range   WBC, UA 0-2  <3 WBC/hpf   RBC / HPF 11-20  <3 RBC/hpf   Bacteria, UA RARE  RARE   Casts HYALINE CASTS (*) NEGATIVE   Urine-Other MUCOUS PRESENT    I-STAT CG4 LACTIC ACID, ED      Result Value Ref Range   Lactic Acid, Venous 7.24 (*) 0.5 - 2.2 mmol/L   Imaging Review Dg Chest Portable 1 View  03/01/2014   CLINICAL DATA:  Altered mental status, shortness of breath.  EXAM: PORTABLE CHEST - 1 VIEW  COMPARISON:  Chest radiograph January 01, 2014  FINDINGS: Moderate cardiomegaly, increased from prior examination. Central pulmonary vascular congestion and mild interstitial prominence without pleural effusions or focal consolidations. Trachea projects midline and there is no pneumothorax. Soft tissue planes and included osseous structures are nonsuspicious.  IMPRESSION: Increasing cardiomegaly with interstitial prominence consistent with pulmonary edema, no focal consolidation or pleural effusion.    Electronically Signed   By: Elon Alas   On: 03/01/2014 03:22     EKG Interpretation   Date/Time:  Thursday March 01 2014 03:14:13 EDT Ventricular Rate:  84 PR Interval:  141 QRS Duration: 97 QT Interval:  417 QTC Calculation: 493 R Axis:   19 Text Interpretation:  Sinus rhythm Abnormal R-wave progression, early  transition Borderline prolonged QT interval When compared with ECG of  01/01/2014, QT has lengthened Confirmed by [facility]  MD, Marialuiza Car ([id number]) on  03/01/2014 3:29:26 AM      CRITICAL CARE Performed by: YNWGN,FAOZH Total critical care time: 110 minutes Critical care time was exclusive of separately billable procedures and treating other patients. Critical care was necessary to treat or prevent imminent or life-threatening deterioration. Critical care was time spent  personally by me on the following activities: development of treatment plan with patient and/or surrogate as well as nursing, discussions with consultants, evaluation of patient's response to treatment, examination of patient, obtaining history from patient or surrogate, ordering and performing treatments and interventions, ordering and review of laboratory studies, ordering and review of radiographic studies, pulse oximetry and re-evaluation of patient's condition.  MDM   Final diagnoses:  Agitation  Altered mental status, unspecified altered mental status type  Elevated lactic acid level  Multiple sclerosis    Acute agitation of uncertain cause. Septic workup is initiated although I wonder if this might be a reaction to some of his medications. All records are reviewed and he has several hospitalizations for pneumonia. According to his wife, and his multiple sclerosis is managed through the [facility] in [place]. Initial management is started with Geodon to control agitation and he is started on IV hydration.  He initially had improvement in mental state with Geodon but became progressively  more agitated again and was treated with lorazepam. Workup is significant for elevated lactic acid and he is given aggressive hydration. CK is normal. He will need to be admitted. Case is discussed with Dr. Hal Hope outside hospital is increased admit the patient to step down unit.  Delora Fuel, MD 68/03/21 2248  Have to admission have been arranged, patient became much more agitated and was unable to be controlled. He was given additional Geodon as well as lorazepam and Haldol with no change in his clinical status. It was decided to intubate him so that he could be adequately sedated without risk of hypoxia. He was moved to the trauma bay where a rapid sequence intubation was initiated. Intubation proved to be difficult and he did vomit during the course without any gross aspiration. Tube was successfully placed and he was placed on a diprovan drip. Critical care has been consulted to admit the patient to ICU.  Delora Fuel, MD 25/00/37 0488

## 2014-03-01 NOTE — Progress Notes (Signed)
  Echocardiogram 2D Echocardiogram has been performed.  Randy Greene 03/01/2014, 3:02 PM

## 2014-03-01 NOTE — Progress Notes (Signed)
EEG Completed; Results Pending  
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
## 2014-03-01 NOTE — Procedures (Signed)
EEG report.  Brief clinical history: 48 y/o with MS on disease modifying therapy with Tysabri, on multiple other medications, admitted to the NICU with agitated delirium of unclear etiology. Question of tremor/seizure/or rigors while in the unit.   Technique: this is a 17 channel routine scalp EEG performed at the bedside in the ICU setting, with bipolar and monopolar montages arranged in accordance to the international 10/20 system of electrode placement. One channel was dedicated to EKG recording.  The patient is intubated on the vent, but sedatives were turn off during recording. No activating procedures performed.  Description: as the study begins and throughout the entire recording, there is evidence of normal sleep architecture without evidence of intermixed epileptiform discharges. No electrographic seizures noted.  EKG showed sinus rhythm.  Impression: this is a normal asleep EEG. Please, be aware that a normal EEG does not exclude the possibility of epilepsy.  Clinical correlation is advised.   Dorian Pod, MD

## 2014-03-01 NOTE — ED Notes (Signed)
Pt becoming increasingly agitated trying to get out of bed. Dr. Roxanne Mins notified. Pt disoriented x4 at this time.

## 2014-03-01 NOTE — Progress Notes (Signed)
UR completed.  Brentton Wardlow, RN BSN MHA CCM Trauma/Neuro ICU Case Manager [phone]  

## 2014-03-01 NOTE — Progress Notes (Signed)
Chaplain responded to ED Page to assist with family of patient who was disoriented and combative.  Pt family was in ED Room and helping to restrain pt until Fiance was emotionally overwhelmed and collapsed on the hallway floor outside of trauma bay.  Domingo Mend, Santiago Glad was highly emotional and non-verbal for approx. 35 minutes. Karen's mother (also present throughout episode) informed Chaplain that Santiago Glad had not slept in several nights and was exhausted. After conversation with ER Doctor, Santiago Glad started asking questions about pt care and next steps.  Santiago Glad expressed that she was feeling sick and panicked in the ER Room, but was now felling better.  Chaplain offered emotional support through empathetic listening and ministry of presence.  Hospitality was also offered to both Santiago Glad and her mother, as well as emotional comfort.  Chaplain escorted family to [facility] waiting area and informed nurses of their presence and facilitated sharing of information so that family could go home and rest.  Family will return later in the day.  Chaplains will be available to follow up as needed.    03/01/14 0532  Clinical Encounter Type  Visited With Patient;Family;Health care provider  Visit Type Initial;Psychological support;Critical Care;ED  Referral From Nurse  Spiritual Encounters  Spiritual Needs Prayer;Emotional  Stress Factors  Patient Stress Factors Health changes;Loss of control  Family Stress Factors Exhausted;Health changes;Loss of control   [employer], Chaplain

## 2014-03-01 NOTE — Significant Event (Signed)
Pt with questionable seizure like activity noted by RN.  On my arrival to room, RN reports seizure activity resolved spontaneously.  He then had what appeared to be rigors.  Noted to have brown material when suctioning from ETT >> hx of chronic aspiration.  Will start vancomycin, zosyn.  Neurology consulted.  Chesley Mires, MD [facility] Pulmonary/Critical Care 03/01/2014, 8:31 AM Pager:  [phone] After 3pm call: [phone]

## 2014-03-01 NOTE — Progress Notes (Signed)
Tracheal aspirate obtained/sent to lab, RN made aware.

## 2014-03-01 NOTE — Sedation Documentation (Signed)
Pt successfully intubated by Dr. Roxanne Mins.

## 2014-03-01 NOTE — Progress Notes (Signed)
ANTIBIOTIC CONSULT NOTE - INITIAL  Pharmacy Consult for Vancomycin/zosyn Indication: Pneumonia  Allergies  Allergen Reactions  . Food Nausea And Vomiting    GREEN PEPPERS- flushed  . Mushroom Extract Complex Nausea And Vomiting    And flushed  . Penicillins Hives    FAMILY HISTORY  . Adhesive [Tape] Rash    Patient Measurements: Height: 5\' 10"  (177.8 cm) Weight: 221 lb 5.5 oz (100.4 kg) IBW/kg (Calculated) : 73  Vital Signs: Temp: 99.1 F (37.3 C) (09/10 0721) Temp src: Axillary (09/10 0721) BP: 114/62 mmHg (09/10 0721) Pulse Rate: 45 (09/10 0721) Intake/Output from previous day: 09/09 0701 - 09/10 0700 In: 41.3 [I.V.:41.3] Out: -  Intake/Output from this shift: Total I/O In: 46.3 [I.V.:46.3] Out: -   Labs:  Recent Labs  03/01/14 0249  WBC 16.4*  HGB 12.9*  PLT 197  CREATININE 0.85   Estimated Creatinine Clearance: 124.9 ml/min (by C-G formula based on Cr of 0.85). No results found for this basename: VANCOTROUGH, VANCOPEAK, VANCORANDOM, GENTTROUGH, GENTPEAK, GENTRANDOM, TOBRATROUGH, TOBRAPEAK, TOBRARND, AMIKACINPEAK, AMIKACINTROU, AMIKACIN,  in the last 72 hours   Microbiology: No results found for this or any previous visit (from the past 720 hour(s)).  Medical History: Past Medical History  Diagnosis Date  . MS (multiple sclerosis)   . Hypertension   . Enlarged prostate   . Acid reflux   . Aspiration pneumonia   . Hypercapnic respiratory failure, chronic     Assessment: 60 YOM with MS brought to the ED for sever agitation and required intubation. He also has history of chronic aspiration. Pharmacy is consulted to start vancomycin and zosyn empirically for pneumonia. He is afebrile, wbc elevated 16.4, scr 0.85, est. crcl > 90 ml/min.   Vancomycin 9/10 >> Zosyn 9/10  9/10 Blood - 9/10 urine -  9/10 resp. -  Goal of Therapy:  Vancomycin trough level 15-20 mcg/ml  Plan:  - Vancomycin 1g IV Q 8 hrs - Zosyn 3.375g IV Q 8 hrs (4 hr infusion) -  Monitor renal function and f/u cultures. - Check vancomycin trough at steady state if indicated.   Maryanna Shape, PharmD, BCPS  Clinical Pharmacist  Pager: [phone]   03/01/2014,8:38 AM
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
## 2014-03-01 NOTE — Sedation Documentation (Signed)
Dr. Roxanne Mins attempted to intubate x 2, unsuccessful. Dr Sharol Given at the bedside at this time to attempt intubation.

## 2014-03-01 NOTE — ED Notes (Signed)
This RN spoke with MD Roxanne Mins about pt meeting criteria for a level 2 code sepsis. Dr. Roxanne Mins did not want to call a code sepsis at this time.

## 2014-03-02 ENCOUNTER — Inpatient Hospital Stay (HOSPITAL_COMMUNITY): Payer: Medicare Other

## 2014-03-02 DIAGNOSIS — J9600 Acute respiratory failure, unspecified whether with hypoxia or hypercapnia: Secondary | ICD-10-CM

## 2014-03-02 DIAGNOSIS — A419 Sepsis, unspecified organism: Secondary | ICD-10-CM

## 2014-03-02 DIAGNOSIS — J690 Pneumonitis due to inhalation of food and vomit: Secondary | ICD-10-CM

## 2014-03-02 LAB — GLUCOSE, CAPILLARY
GLUCOSE-CAPILLARY: 100 mg/dL — AB (ref 70–99)
GLUCOSE-CAPILLARY: 107 mg/dL — AB (ref 70–99)
Glucose-Capillary: 109 mg/dL — ABNORMAL HIGH (ref 70–99)
Glucose-Capillary: 107 mg/dL — ABNORMAL HIGH (ref 70–99)
Glucose-Capillary: 90 mg/dL (ref 70–99)
Glucose-Capillary: 112 mg/dL — ABNORMAL HIGH (ref 70–99)
Glucose-Capillary: 125 mg/dL — ABNORMAL HIGH (ref 70–99)

## 2014-03-02 LAB — BASIC METABOLIC PANEL
Anion gap: 11 (ref 5–15)
BUN: 20 mg/dL (ref 6–23)
CHLORIDE: 101 meq/L (ref 96–112)
CO2: 29 mEq/L (ref 19–32)
Calcium: 8.4 mg/dL (ref 8.4–10.5)
Creatinine, Ser: 1.32 mg/dL (ref 0.50–1.35)
GFR calc non Af Amer: 62 mL/min — ABNORMAL LOW (ref >90)
GFR, EST AFRICAN AMERICAN: 72 mL/min — AB (ref >90)
Glucose, Bld: 106 mg/dL — ABNORMAL HIGH (ref 70–99)
POTASSIUM: 3.3 meq/L — AB (ref 3.7–5.3)
SODIUM: 141 meq/L (ref 137–147)

## 2014-03-02 LAB — URINE CULTURE
COLONY COUNT: NO GROWTH
CULTURE: NO GROWTH

## 2014-03-02 LAB — CBC
HEMATOCRIT: 34.7 % — AB (ref 39.0–52.0)
Hemoglobin: 11.7 g/dL — ABNORMAL LOW (ref 13.0–17.0)
MCH: 32.1 pg (ref 26.0–34.0)
MCHC: 33.7 g/dL (ref 30.0–36.0)
MCV: 95.1 fL (ref 78.0–100.0)
Platelets: 120 10*3/uL — ABNORMAL LOW (ref 150–400)
RBC: 3.65 MIL/uL — ABNORMAL LOW (ref 4.22–5.81)
RDW: 14.9 % (ref 11.5–15.5)
WBC: 16.5 10*3/uL — AB (ref 4.0–10.5)

## 2014-03-02 LAB — VANCOMYCIN, TROUGH: Vancomycin Tr: 18.3 ug/mL (ref 10.0–20.0)

## 2014-03-02 LAB — PHOSPHORUS: PHOSPHORUS: 3.5 mg/dL (ref 2.3–4.6)

## 2014-03-02 LAB — CORTISOL: CORTISOL PLASMA: 25.3 ug/dL

## 2014-03-02 LAB — MAGNESIUM: Magnesium: 1.8 mg/dL (ref 1.5–2.5)

## 2014-03-02 LAB — HIV ANTIBODY (ROUTINE TESTING W REFLEX): HIV 1&2 Ab, 4th Generation: NONREACTIVE

## 2014-03-02 LAB — LACTIC ACID, PLASMA: LACTIC ACID, VENOUS: 1.6 mmol/L (ref 0.5–2.2)

## 2014-03-02 LAB — VITAMIN B12: Vitamin B-12: 982 pg/mL — ABNORMAL HIGH (ref 211–911)

## 2014-03-02 MED ORDER — VITAL HIGH PROTEIN PO LIQD
1000.0000 mL | ORAL | Status: DC
  Filled 2014-03-02 (×3): qty 1000

## 2014-03-02 MED ORDER — POTASSIUM CHLORIDE 10 MEQ/50ML IV SOLN
10.0000 meq | INTRAVENOUS | Status: AC
  Administered 2014-03-02 (×2): 10 meq via INTRAVENOUS
  Filled 2014-03-02 (×2): qty 50

## 2014-03-02 MED ORDER — GADOBENATE DIMEGLUMINE 529 MG/ML IV SOLN
20.0000 mL | Freq: Once | INTRAVENOUS | Status: AC | PRN
  Administered 2014-03-02: 20 mL via INTRAVENOUS

## 2014-03-02 MED ORDER — VITAL HIGH PROTEIN PO LIQD
1000.0000 mL | ORAL | Status: DC
  Administered 2014-03-02: 1000 mL
  Filled 2014-03-02 (×2): qty 1000

## 2014-03-02 MED ORDER — PRO-STAT SUGAR FREE PO LIQD
60.0000 mL | Freq: Four times a day (QID) | ORAL | Status: DC
Start: 2014-03-02 — End: 2014-03-05
  Administered 2014-03-02 – 2014-03-04 (×10): 60 mL
  Filled 2014-03-02 (×15): qty 60

## 2014-03-02 NOTE — Progress Notes (Signed)
PULMONARY / CRITICAL CARE MEDICINE   Name: Randy Greene MRN: [id number] DOB: 04/15/1966    ADMISSION DATE:  03/01/2014 CONSULTATION DATE:  03/01/2014  REFERRING MD :  Roxanne Mins, EDP  CHIEF COMPLAINT:  Confusion  INITIAL PRESENTATION:  48 yo male former smoker presented with severe agitation/combativeness from polypharmacy in setting of MS and recurrent aspiration pneumonia.  He required intubation for airway support and sedation.  STUDIES:  9/10 CT head >> chronic sinus disease, no other acute process 9/10 EEG >> normal sleep EEG 9/10 Echo >> EF 55 to 60%, mild LVH, PAS 32 mmHg  SIGNIFICANT EVENTS: 9/10 Intubation/admission, ?seizure activity/rigors, neurology consulted  SUBJECTIVE:  Tolerates some pressure support.  VITAL SIGNS: Temp:  [99.7 F (37.6 C)-102.2 F (39 C)] 99.9 F (37.7 C) (09/11 0700) Pulse Rate:  [86-106] 90 (09/11 0700) Resp:  [15-28] 16 (09/11 0700) BP: (84-105)/(45-62) 98/56 mmHg (09/11 0700) SpO2:  [95 %-100 %] 97 % (09/11 0600) FiO2 (%):  [50 %-60 %] 50 % (09/11 0700) VENTILATOR SETTINGS: Vent Mode:  [-] PRVC FiO2 (%):  [50 %-60 %] 50 % Set Rate:  [14 bmp] 14 bmp Vt Set:  [600 mL] 600 mL PEEP:  [5 cmH20] 5 cmH20 Plateau Pressure:  [14 cmH20-16 cmH20] 16 cmH20 INTAKE / OUTPUT:  Intake/Output Summary (Last 24 hours) at 03/02/14 0915 Last data filed at 03/02/14 0800  Gross per 24 hour  Intake 2112.23 ml  Output   1855 ml  Net 257.23 ml    PHYSICAL EXAMINATION: General: no distress Neuro: RASS -1, follows commands, moves extremities HEENT:  Pupils reactive, ETT/NG tubes in place Cardiovascular:  Regular, no murmur Lungs:  B/l rhonchi Abdomen:  Soft, non tender, + bowel sounds Musculoskeletal:  No edema Skin:  No rashes  LABS:  CBC  Recent Labs Lab 03/01/14 1035 03/01/14 1755 03/02/14 0430  WBC 3.6* 10.9* 16.5*  HGB 13.6 12.7* 11.7*  HCT 40.6 37.6* 34.7*  PLT 137* 135* 120*   BMET  Recent Labs Lab 03/01/14 0249 03/01/14 1035  03/02/14 0430  NA 144  --  141  K 3.6*  --  3.3*  CL 100  --  101  CO2 22  --  29  BUN 9  --  20  CREATININE 0.85 0.89 1.32  GLUCOSE 155*  --  106*   Electrolytes  Recent Labs Lab 03/01/14 0249 03/02/14 0430  CALCIUM 10.0 8.4  MG  --  1.8  PHOS  --  3.5   Sepsis Markers  Recent Labs Lab 03/01/14 1610 03/01/14 2210 03/02/14 0432  LATICACIDVEN 1.9 1.7 1.6   ABG  Recent Labs Lab 03/01/14 0807  PHART 7.385  PCO2ART 45.7*  PO2ART 50.0*   Liver Enzymes  Recent Labs Lab 03/01/14 0249  AST 26  ALT 28  ALKPHOS 78  BILITOT 0.7  ALBUMIN 4.4   Glucose  Recent Labs Lab 03/01/14 1311 03/01/14 1537 03/01/14 1933 03/01/14 2355 03/02/14 0425 03/02/14 0810  GLUCAP 107* 123* 120* 107* 109* 100*    Imaging Ct Head Wo Contrast  03/01/2014   CLINICAL DATA:  Altered mental status.  EXAM: CT HEAD WITHOUT CONTRAST  TECHNIQUE: Contiguous axial images were obtained from the base of the skull through the vertex without intravenous contrast.  COMPARISON:  Head CT scan 11/28/2012.  FINDINGS: No evidence of acute intracranial abnormality including hemorrhage, infarct, mass lesion, mass effect, midline shift or abnormal extra-axial fluid collection is identified. There is no hydrocephalus or pneumocephalus. NG tube is in place.  There is  marked mucosal thickening in the maxillary sinuses, worse on the left. There is some high attenuation material within the left maxillary sinus suggesting chronic change or less likely fungal sinusitis. Extensive ethmoid air cell disease also identified. Mild mucosal thickening in the frontal sinuses is noted.  IMPRESSION: No acute intracranial abnormality.  Extensive sinus disease. High attenuation material within the left frontal sinus likely reflects chronic change rather than fungal sinusitis.   Electronically Signed   By: Inge Rise M.D.   On: 03/01/2014 07:20   Dg Chest Port 1 View  03/02/2014   CLINICAL DATA:  Intubation.  EXAM:  PORTABLE CHEST - 1 VIEW  COMPARISON:  03/01/2014.  FINDINGS: Endotracheal tube, NG tube, left IJ line in stable position. Cardiomegaly with bilateral pulmonary alveolar infiltrates consistent with pulmonary edema noted. Pneumonia cannot be excluded. Small left pleural effusion cannot be excluded. No pneumothorax. No acute bony abnormality.  IMPRESSION: 1. Line and tube positions stable. 2. Cardiomegaly with bilateral pulmonary alveolar infiltrates consistent with pulmonary edema. Pneumonia cannot be excluded. 3. Small left pleural effusion cannot be excluded.   Electronically Signed   By: Marcello Moores  Register   On: 03/02/2014 07:22   Dg Chest Port 1 View  03/01/2014   CLINICAL DATA:  Central line placement.  EXAM: PORTABLE CHEST - 1 VIEW  COMPARISON:  Single view of the chest 03/01/2014 and 6:07 a.m.  FINDINGS: New left IJ catheter is in place with tip projecting over the lower superior vena cava. No pneumothorax. Endotracheal tube and NG tube are again seen. There are new bibasilar airspace opacities. Heart size is normal.  IMPRESSION: Left IJ catheter tip projects in the lower superior vena cava. Negative for pneumothorax.  New bibasilar airspace opacities, likely atelectasis.   Electronically Signed   By: Inge Rise M.D.   On: 03/01/2014 10:16   Dg Chest Portable 1 View  03/01/2014   CLINICAL DATA:  Assess endotracheal tube.  EXAM: PORTABLE CHEST - 1 VIEW  COMPARISON:  03/01/2014  FINDINGS: Endotracheal tube placed with tip measuring 6.9 cm above the carinal. Enteric tube tip is not well visualized but appears to be below the left hemidiaphragm. Shallow inspiration. Mild cardiac enlargement with prominent central pulmonary vascularity suggesting congestion. No edema or consolidation. No blunting of costophrenic angles. No pneumothorax.  IMPRESSION: Appliances appear to be in satisfactory location. Cardiac enlargement with pulmonary vascular congestion. No definite edema.   Electronically Signed   By:  Lucienne Capers M.D.   On: 03/01/2014 06:24   Dg Chest Portable 1 View  03/01/2014   CLINICAL DATA:  Altered mental status, shortness of breath.  EXAM: PORTABLE CHEST - 1 VIEW  COMPARISON:  Chest radiograph January 01, 2014  FINDINGS: Moderate cardiomegaly, increased from prior examination. Central pulmonary vascular congestion and mild interstitial prominence without pleural effusions or focal consolidations. Trachea projects midline and there is no pneumothorax. Soft tissue planes and included osseous structures are nonsuspicious.  IMPRESSION: Increasing cardiomegaly with interstitial prominence consistent with pulmonary edema, no focal consolidation or pleural effusion.   Electronically Signed   By: Elon Alas   On: 03/01/2014 03:22   Dg Abd Portable 1v  03/01/2014   CLINICAL DATA:  Nasogastric tube placement  EXAM: PORTABLE ABDOMEN - 1 VIEW  COMPARISON:  None.  FINDINGS: Nasogastric tube tip and side port are in the region of the distal stomach. Visualized bowel gas pattern is unremarkable.  IMPRESSION: Nasogastric tube tip and side-port in region of distal stomach.   Electronically Signed  By: Lowella Grip M.D.   On: 03/01/2014 08:31     ASSESSMENT / PLAN:  NEUROLOGIC A:   Acute encephalopathy with combative behavior > suspect polypharmacy but etiology uncertain >> mental status improved 9/11. Hx of multiple sclerosis. P:   RASS goal: -1 PAD 3 Hold outpt xanax, ampyra, tysabri, aricept, marinol, neurontin, ritalin, provigil, oxycodone, requip >> defer to neurology if/when to resume these Continue baclofen to prevent withdrawal F/u MRI brain  PULMONARY OETT 9/10 >> A:  Acute respiratory failure 2nd to altered mental status/airway protection and recurrent aspiration pneumonia. Hx of chronic hypercapnic respiratory failure. P:  Pressure support wean as tolerated >> not ready for extubation trial yet F/u CXR Adjust oxygen to keep SpO2 > 92%  CARDIOVASCULAR Lt IJ CVL 9/10  >> A:  Sinus tachycardia from agitation/pneumonia/sepsis >> improved 9/11. Hx of HTN, hyperlipidemia. P:  Monitor hemodynamics Hold outpt tenormin Crestor, lipitor in outpt med list >> hold both for now Goal even fluid balance  RENAL A:   Hypokalemia. Hx of BPH. P:   F/u and replace electrolytes as needed Monitor renal fx, urine outpt Hold cardura  GASTROINTESTINAL A:   Nutrition. Hx of GERD. P:   NPO Protonix bid Tube feeds while on vent  HEMATOLOGIC A:   Leukocytosis in setting of aspiration pneumonia. P:  F/u CBC SQ heparin for DVT prevention  INFECTIOUS A:   Recurrent aspiration pneumonia with sepsis. P:   Day 2 vancomycin, zosyn started 9/10  Blood 9/10 >> Urine 9/10 >> Sputum 9/10 >>   ENDOCRINE A:   No acute issues. P:   Monitor CBG's   TODAY'S SUMMARY:  VDRF from recurrent aspiration, polypharmacy in setting of MS.  Wean on vent >> not ready for extubation.  F/u MRI brain.  CC time 35 minutes.  Chesley Mires, MD [facility] Pulmonary/Critical Care 03/02/2014, 9:15 AM Pager:  [phone] After 3pm call: [phone]

## 2014-03-02 NOTE — Progress Notes (Signed)
200 ml fentanyl wasted down sink. Arcola Jansky witnessed.

## 2014-03-02 NOTE — Progress Notes (Signed)
Pt. Transported uneventfully to/from MRI, 3M06.

## 2014-03-02 NOTE — Progress Notes (Signed)
Note/chart reviewed. Agree with note.   Seymore Brodowski RD, LDN, CNSC [phone] Pager [phone] After Hours Pager   

## 2014-03-02 NOTE — Progress Notes (Signed)
[facility] ADULT ICU REPLACEMENT PROTOCOL FOR AM LAB REPLACEMENT ONLY  The patient does apply for the [facility] Adult ICU Electrolyte Replacment Protocol based on the criteria listed below:   1. Is GFR >/= 40 ml/min? Yes.    Patient's GFR today is 62 2. Is urine output >/= 0.5 ml/kg/hr for the last 6 hours? Yes.   Patient's UOP is 0.5 ml/kg/hr 3. Is BUN < 60 mg/dL? Yes.    Patient's BUN today is 20 4. Abnormal electrolyte(s): Potassium 5. Ordered repletion with: Potassium per protocol  Rakel Junio P 03/02/2014 5:34 AM

## 2014-03-02 NOTE — Progress Notes (Signed)
Pt. Placed to wean, RN made aware, delayed due to busy with first rounds, RT to monitor.

## 2014-03-02 NOTE — Progress Notes (Signed)
ANTIBIOTIC CONSULT NOTE - FOLLOW UP  Pharmacy Consult:  Vancomycin Indication:  PNA  Allergies  Allergen Reactions  . Food Nausea And Vomiting    GREEN PEPPERS- flushed  . Morphine And Related Other (See Comments)    [facility]  . Mushroom Extract Complex Nausea And Vomiting    And flushed  . Penicillins Hives    FAMILY HISTORY  . Zoloft [Sertraline Hcl] Other (See Comments)    Per [facility] records  . Adhesive [Tape] Rash    Patient Measurements: Height: 6\' 3"  (190.5 cm) Weight: 217 lb 9.5 oz (98.7 kg) IBW/kg (Calculated) : 84.5  Vital Signs: Temp: 99.7 F (37.6 C) (09/11 1800) BP: 112/63 mmHg (09/11 1800) Pulse Rate: 92 (09/11 1800) Intake/Output from previous day: 09/10 0701 - 09/11 0700 In: 2554.3 [I.V.:1754.3; IV Piggyback:800] Out: 1935 [Urine:1935] Intake/Output from this shift: Total I/O In: 1068.3 [I.V.:768.3; IV Piggyback:300] Out: 455 [Urine:455]  Labs:  Recent Labs  03/01/14 0249 03/01/14 1035 03/01/14 1755 03/02/14 0430  WBC 16.4* 3.6* 10.9* 16.5*  HGB 12.9* 13.6 12.7* 11.7*  PLT 197 137* 135* 120*  CREATININE 0.85 0.89  --  1.32   Estimated Creatinine Clearance: 80.9 ml/min (by C-G formula based on Cr of 1.32).  Recent Labs  03/02/14 1737  [place] 18.3     Microbiology: Recent Results (from the past 720 hour(s))  CULTURE, BLOOD (ROUTINE X 2)     Status: None   Collection Time    03/01/14  3:31 AM      Result Value Ref Range Status   Specimen Description BLOOD RIGHT ARM   Final   Special Requests BOTTLES DRAWN AEROBIC AND ANAEROBIC 6ML   Final   Culture  Setup Time     Final   Value: 03/01/2014 08:13     Performed at [facility]   Culture     Final   Value:        BLOOD CULTURE RECEIVED NO GROWTH TO DATE CULTURE WILL BE HELD FOR 5 DAYS BEFORE ISSUING A FINAL NEGATIVE REPORT     Performed at [facility]   Report Status PENDING   Incomplete  MRSA PCR SCREENING     Status: None   Collection Time    03/01/14   7:25 AM      Result Value Ref Range Status   MRSA by PCR NEGATIVE  NEGATIVE Final   Comment:            The GeneXpert MRSA Assay (FDA     approved for NASAL specimens     only), is one component of a     comprehensive MRSA colonization     surveillance program. It is not     intended to diagnose MRSA     infection nor to guide or     monitor treatment for     MRSA infections.  URINE CULTURE     Status: None   Collection Time    03/01/14  8:41 AM      Result Value Ref Range Status   Specimen Description URINE, CATHETERIZED   Final   Special Requests NONE   Final   Culture  Setup Time     Final   Value: 03/01/2014 12:41     Performed at [place]     Final   Value: NO GROWTH     Performed at [facility]   Culture     Final   Value:  NO GROWTH     Performed at [facility]   Report Status 03/02/2014 FINAL   Final  CULTURE, RESPIRATORY (NON-EXPECTORATED)     Status: None   Collection Time    03/01/14  8:43 AM      Result Value Ref Range Status   Specimen Description ENDOTRACHEAL   Final   Special Requests NONE   Final   Gram Stain     Final   Value: RARE WBC PRESENT, PREDOMINANTLY PMN     RARE SQUAMOUS EPITHELIAL CELLS PRESENT     FEW GRAM NEGATIVE RODS     RARE YEAST     Performed at [facility]   Culture     Final   Value: ABUNDANT GRAM NEGATIVE RODS     Performed at [facility]   Report Status PENDING   Incomplete  CULTURE, BLOOD (ROUTINE X 2)     Status: None   Collection Time    03/01/14 10:01 AM      Result Value Ref Range Status   Specimen Description BLOOD RIGHT HAND   Final   Special Requests BOTTLES DRAWN AEROBIC ONLY 5CC   Final   Culture  Setup Time     Final   Value: 03/01/2014 14:25     Performed at [facility]   Culture     Final   Value:        BLOOD CULTURE RECEIVED NO GROWTH TO DATE CULTURE WILL BE HELD FOR 5 DAYS BEFORE ISSUING A FINAL NEGATIVE REPORT     Performed at [facility]   Report Status PENDING   Incomplete      Assessment: 6 YOM continues on vancomycin and Zosyn for aspiration PNA.  Patient's renal function is worsening; however, his vancomycin trough is therapeutic.  Vanc 9/10 >> Zosyn 9/10 >>  9/11 VT = 18.3 mcg/mL on 1gm q8h  9/10 blood - NGTD 9/10 urine - negative 9/10 resp - abundant GNR    Goal of Therapy:  Vancomycin trough level 15-20 mcg/ml   Plan:  - Continue vanc 1gm IV Q8H and Zosyn 3.375gm IV Q8H - Monitor renal fxn, clinical progress - Consider discontinuing vancomycin given respiratory culture grew abundant GNR - Repeat vanc trough soon to rule out accumulation if therapy is continued    Ellyn Rubiano D. Mina Marble, PharmD, BCPS Pager:  [phone] 03/02/2014, 6:54 PM

## 2014-03-02 NOTE — Progress Notes (Signed)
NUTRITION FOLLOW-UP/CONSULT  DOCUMENTATION CODES Per approved criteria  -Obesity Unspecified   INTERVENTION:  Vital HP @ 20 ml/hr via OGT   60 ml Prostat QID.    Tube feeding regimen provides 1280 kcals, 162 grams of protein and 401 ml of H2O.  TF regimen plus current Propofol infusion provides 1757 total kcal (23 kcal/kg of IBW)  NUTRITION DIAGNOSIS: Inadequate oral intake related to inability to eat as evidenced by NPO status; ongoing.   Goal: Enteral nutrition to provide 60-70% of estimated calorie needs (22-25 kcals/kg ideal body weight) and 100% of estimated protein needs, based on ASPEN guidelines for permissive underfeeding in critically ill obese individuals, not met.   Monitor:  TF regimen & tolerance, respiratory status, weight, labs, I/O's   ASSESSMENT: 48 y/o male with multiple sclerosis brought to [facility] ED on 9/10 for severe agitation after several days of confusion. He eventually required heavy sedation and intubation.  Patient is currently intubated on ventilator support d/t acute respiratory failure 2/2 altered mental status/airway protection MV: 10.1 L/min Temp (24hrs), Avg:100.7 F (38.2 C), Min:99.7 F (37.6 C), Max:102.2 F (39 C)  Propofol: 18.1 ml/hr, providing 477 fat kcal  Labs reviewed:  Low K  Height: Ht Readings from Last 1 Encounters:  03/01/14 _0  (1.778 m)    Weight: Wt Readings from Last 1 Encounters:  03/01/14 221 lb 5.5 oz (100.4 kg)    BMI:  Body mass index is 31.76 kg/(m^2).  Estimated Nutritional Needs: Kcal: 2283 Protein: 150-160g Fluid: 2.1L/day  Skin: intact  Diet Order: NPO   Intake/Output Summary (Last 24 hours) at 03/02/14 1214 Last data filed at 03/02/14 1206  Gross per 24 hour  Intake 2386.93 ml  Output    795 ml  Net 1591.93 ml    Last BM: PTA   Labs:   Recent Labs Lab 03/01/14 0249 03/01/14 1035 03/02/14 0430  NA 144  --  141  K 3.6*  --  3.3*  CL 100  --  101  CO2 22  --  29  BUN 9  --   20  CREATININE 0.85 0.89 1.32  CALCIUM 10.0  --  8.4  MG  --   --  1.8  PHOS  --   --  3.5  GLUCOSE 155*  --  106*    CBG (last 3)   Recent Labs  03/01/14 2355 03/02/14 0425 03/02/14 0810  GLUCAP 107* 109* 100*    Scheduled Meds: . antiseptic oral rinse  7 mL Mouth Rinse QID  . baclofen  5 mg Oral BID   And  . baclofen  10 mg Oral QHS  . chlorhexidine  15 mL Mouth Rinse BID  . feeding supplement (VITAL HIGH PROTEIN)  1,000 mL Per Tube Q24H  . heparin  5,000 Units Subcutaneous 3 times per day  . pantoprazole sodium  40 mg Per Tube BID  . piperacillin-tazobactam (ZOSYN)  IV  3.375 g Intravenous Q8H  . vancomycin  1,000 mg Intravenous Q8H    Continuous Infusions: . sodium chloride 50 mL/hr at 03/02/14 1000  . propofol 30 mcg/kg/min (03/02/14 1206)   [place], [place], [place] Pager [phone] After Hours Pager

## 2014-03-03 ENCOUNTER — Inpatient Hospital Stay (HOSPITAL_COMMUNITY): Payer: Medicare Other

## 2014-03-03 DIAGNOSIS — E876 Hypokalemia: Secondary | ICD-10-CM

## 2014-03-03 LAB — CBC
HCT: 30.8 % — ABNORMAL LOW (ref 39.0–52.0)
HEMOGLOBIN: 10 g/dL — AB (ref 13.0–17.0)
MCH: 31.3 pg (ref 26.0–34.0)
MCHC: 32.5 g/dL (ref 30.0–36.0)
MCV: 96.3 fL (ref 78.0–100.0)
Platelets: 130 10*3/uL — ABNORMAL LOW (ref 150–400)
RBC: 3.2 MIL/uL — ABNORMAL LOW (ref 4.22–5.81)
RDW: 14.8 % (ref 11.5–15.5)
WBC: 13.3 10*3/uL — ABNORMAL HIGH (ref 4.0–10.5)

## 2014-03-03 LAB — CULTURE, RESPIRATORY

## 2014-03-03 LAB — GLUCOSE, CAPILLARY
GLUCOSE-CAPILLARY: 116 mg/dL — AB (ref 70–99)
GLUCOSE-CAPILLARY: 119 mg/dL — AB (ref 70–99)
Glucose-Capillary: 111 mg/dL — ABNORMAL HIGH (ref 70–99)
Glucose-Capillary: 96 mg/dL (ref 70–99)

## 2014-03-03 LAB — BASIC METABOLIC PANEL
Anion gap: 9 (ref 5–15)
BUN: 27 mg/dL — ABNORMAL HIGH (ref 6–23)
CALCIUM: 8.7 mg/dL (ref 8.4–10.5)
CO2: 30 mEq/L (ref 19–32)
Chloride: 104 mEq/L (ref 96–112)
Creatinine, Ser: 1.1 mg/dL (ref 0.50–1.35)
GFR calc Af Amer: 89 mL/min — ABNORMAL LOW (ref >90)
GFR, EST NON AFRICAN AMERICAN: 77 mL/min — AB (ref >90)
Glucose, Bld: 101 mg/dL — ABNORMAL HIGH (ref 70–99)
Potassium: 3.4 mEq/L — ABNORMAL LOW (ref 3.7–5.3)
SODIUM: 143 meq/L (ref 137–147)

## 2014-03-03 LAB — CULTURE, RESPIRATORY W GRAM STAIN

## 2014-03-03 LAB — MAGNESIUM: Magnesium: 2.3 mg/dL (ref 1.5–2.5)

## 2014-03-03 MED ORDER — POTASSIUM CHLORIDE 10 MEQ/50ML IV SOLN
10.0000 meq | INTRAVENOUS | Status: AC
  Administered 2014-03-03 (×2): 10 meq via INTRAVENOUS
  Filled 2014-03-03 (×2): qty 50

## 2014-03-03 MED ORDER — VITAL HIGH PROTEIN PO LIQD
1000.0000 mL | ORAL | Status: DC
  Administered 2014-03-03: 20:00:00
  Administered 2014-03-03: 1000 mL
  Administered 2014-03-03 (×2)
  Administered 2014-03-04: 1000 mL
  Administered 2014-03-05 (×2)
  Filled 2014-03-03 (×4): qty 1000

## 2014-03-03 MED ORDER — FENTANYL BOLUS VIA INFUSION
50.0000 ug | INTRAVENOUS | Status: DC | PRN
  Administered 2014-03-05 (×2): 100 ug via INTRAVENOUS
  Filled 2014-03-03: qty 100

## 2014-03-03 MED ORDER — SODIUM CHLORIDE 0.9 % IV SOLN
0.0000 ug/h | INTRAVENOUS | Status: DC
  Administered 2014-03-03: 50 ug/h via INTRAVENOUS
  Administered 2014-03-04: 200 ug/h via INTRAVENOUS
  Administered 2014-03-04: 175 ug/h via INTRAVENOUS
  Filled 2014-03-03 (×4): qty 50

## 2014-03-03 MED ORDER — DEXTROSE 5 % IV SOLN
2.0000 g | INTRAVENOUS | Status: DC
  Administered 2014-03-03 – 2014-03-06 (×4): 2 g via INTRAVENOUS
  Filled 2014-03-03 (×5): qty 2

## 2014-03-03 NOTE — Progress Notes (Signed)
NEURO HOSPITALIST PROGRESS NOTE   SUBJECTIVE:                                                                                                                        Remains intubated and requiring propofol but with eyes open and following some simple commands. MRI brain showed no acute intracranial abnormality. EEG unimpressive. Low grade fever with slight leukocytosis.  CXR 9/11 bilateral pulmonary alveolar infiltrates consistent with pulmonary edema but neumonia cannot be excluded.  OBJECTIVE:                                                                                                                           Vital signs in last 24 hours: Temp:  [99.5 F (37.5 C)-100.8 F (38.2 C)] 100 F (37.8 C) (09/12 0746) Pulse Rate:  [60-123] 85 (09/12 0746) Resp:  [14-27] 17 (09/12 0746) BP: (97-128)/(50-74) 105/59 mmHg (09/12 0746) SpO2:  [94 %-96 %] 95 % (09/12 0746) FiO2 (%):  [40 %] 40 % (09/12 0746) Weight:  [98.7 kg (217 lb 9.5 oz)-101.1 kg (222 lb 14.2 oz)] 101.1 kg (222 lb 14.2 oz) (09/12 0500)  Intake/Output from previous day: 09/11 0701 - 09/12 0700 In: 2701.9 [I.V.:1761.9; NG/GT:90; IV Piggyback:850] Out: 1135 [Urine:1135] Intake/Output this shift:   Nutritional status: NPO  Past Medical History  Diagnosis Date  . MS (multiple sclerosis)   . Hypertension   . Enlarged prostate   . Acid reflux   . Aspiration pneumonia   . Hypercapnic respiratory failure, chronic    Neurologic Exam:  Mental status: sedated with propofol and Fentanyl, but open eyes to verbal commands and is able to follow simple commands.   CN 2-12: pupils 1-1.5 mm, reactive. No gaze preference. Couldn't elicit corneal reflexes. EOM appropriate. No nystagmus. No frank facial weakness. Tongue: intubated.  Motor: moves upper extremities upon painful stimuli.  Sensory: no tested DTR's: 1 all over.  Plantars: upgoing.  Coordination and gait: unable to test.  Lab  Results: No results found for this basename: cbc, bmp, coags, chol, tri, ldl, hga1c   Lipid Panel  Recent Labs  03/01/14 1035  TRIG 210*    Studies/Results: Mr Kizzie Fantasia Contrast  03/02/2014   CLINICAL DATA:  Mental  status changes.  Multiple sclerosis.  EXAM: MRI HEAD WITHOUT AND WITH CONTRAST  TECHNIQUE: Multiplanar, multiecho pulse sequences of the brain and surrounding structures were obtained without and with intravenous contrast.  CONTRAST:  20 cc MultiHance  COMPARISON:  Head CT 03/01/2014  FINDINGS: Diffusion imaging does not show any acute or subacute infarction. The brainstem and cerebellum are normal. Within the cerebral hemispheres, there are scattered foci of T2 and FLAIR signal within the deep white matter. Given the clinical history multiple sclerosis, these lesions are consistent with foci of demyelination. They could not be distinguished from chronic small-vessel ischemic change. No cortical or large vessel territory insult. No mass lesion, hemorrhage, hydrocephalus or subdural collection. 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 This is not of clinical relevance. After contrast administration, no abnormal enhancement occurs. No pituitary mass. There are mucosal inflammatory changes throughout the paranasal sinuses.  IMPRESSION: No acute brain finding. No restricted diffusion or contrast enhancement.  Multifocal white matter lesions consistent with the clinical diagnosis of multiple sclerosis. The differential diagnosis would include chronic small-vessel ischemic changes.  Mucosal inflammatory changes throughout the paranasal sinuses.   Electronically Signed   By: Nelson Chimes M.D.   On: 03/02/2014 16:46   Dg Chest Port 1 View  03/02/2014   CLINICAL DATA:  Intubation.  EXAM: PORTABLE CHEST - 1 VIEW  COMPARISON:  03/01/2014.  FINDINGS: Endotracheal tube, NG tube, left IJ line in stable position. Cardiomegaly with bilateral  pulmonary alveolar infiltrates consistent with pulmonary edema noted. Pneumonia cannot be excluded. Small left pleural effusion cannot be excluded. No pneumothorax. No acute bony abnormality.  IMPRESSION: 1. Line and tube positions stable. 2. Cardiomegaly with bilateral pulmonary alveolar infiltrates consistent with pulmonary edema. Pneumonia cannot be excluded. 3. Small left pleural effusion cannot be excluded.   Electronically Signed   By: Marcello Moores  Register   On: 03/02/2014 07:22   Dg Chest Port 1 View  03/01/2014   CLINICAL DATA:  Central line placement.  EXAM: PORTABLE CHEST - 1 VIEW  COMPARISON:  Single view of the chest 03/01/2014 and 6:07 a.m.  FINDINGS: New left IJ catheter is in place with tip projecting over the lower superior vena cava. No pneumothorax. Endotracheal tube and NG tube are again seen. There are new bibasilar airspace opacities. Heart size is normal.  IMPRESSION: Left IJ catheter tip projects in the lower superior vena cava. Negative for pneumothorax.  New bibasilar airspace opacities, likely atelectasis.   Electronically Signed   By: Inge Rise M.D.   On: 03/01/2014 10:16   Dg Abd Portable 1v  03/01/2014   CLINICAL DATA:  Nasogastric tube placement  EXAM: PORTABLE ABDOMEN - 1 VIEW  COMPARISON:  None.  FINDINGS: Nasogastric tube tip and side port are in the region of the distal stomach. Visualized bowel gas pattern is unremarkable.  IMPRESSION: Nasogastric tube tip and side-port in region of distal stomach.   Electronically Signed   By: Lowella Grip M.D.   On: 03/01/2014 08:31    MEDICATIONS  Scheduled: . antiseptic oral rinse  7 mL Mouth Rinse QID  . baclofen  5 mg Oral BID   And  . baclofen  10 mg Oral QHS  . chlorhexidine  15 mL Mouth Rinse BID  . feeding supplement (PRO-STAT SUGAR FREE 64)  60 mL Per Tube QID  . feeding supplement (VITAL HIGH  PROTEIN)  1,000 mL Per Tube Q24H  . heparin  5,000 Units Subcutaneous 3 times per day  . pantoprazole sodium  40 mg Per Tube BID  . piperacillin-tazobactam (ZOSYN)  IV  3.375 g Intravenous Q8H  . potassium chloride  10 mEq Intravenous Q1 Hr x 2  . vancomycin  1,000 mg Intravenous Q8H    ASSESSMENT/PLAN:                                                                                                           48 y/o with MS on disease modifying therapy with Tysabri, on multiple other medications, admitted to the NICU with agitated delirium, presumably related to polypharmacy. MRI brain and EEG unrevealing. Still requiring sedatives.  Low grade fever with slight leukocytosis: will be in favor or LP if not definitive source of infection identifies and he remains agitated. Will follow up.  Dorian Pod, MD [facility] [phone]  03/03/2014, 8:24 AM

## 2014-03-03 NOTE — Progress Notes (Signed)
[facility] ADULT ICU REPLACEMENT PROTOCOL FOR AM LAB REPLACEMENT ONLY  The patient does apply for the [facility] Adult ICU Electrolyte Replacment Protocol based on the criteria listed below:   1. Is GFR >/= 40 ml/min? Yes.    Patient's GFR today is 77 2. Is urine output >/= 0.5 ml/kg/hr for the last 6 hours? Yes.   Patient's UOP is 0.5 ml/kg/hr 3. Is BUN < 60 mg/dL? Yes.    Patient's BUN today is 27 4. Abnormal electrolyte(s): Potassium 5. Ordered repletion with: Potassium per Protocol  Randy Greene P 03/03/2014 6:40 AM

## 2014-03-03 NOTE — Progress Notes (Signed)
PULMONARY / CRITICAL CARE MEDICINE   Name: Randy Greene MRN: [id number] DOB: 08/25/1965    ADMISSION DATE:  03/01/2014 CONSULTATION DATE:  03/01/2014  REFERRING MD :  Roxanne Mins, EDP  CHIEF COMPLAINT:  Confusion  INITIAL PRESENTATION:  48 yo male former smoker presented with severe agitation/combativeness from polypharmacy in setting of MS and recurrent aspiration pneumonia.  He required intubation for airway support and sedation.  STUDIES:  9/10 CT head >> chronic sinus disease, no other acute process 9/10 EEG >> normal sleep EEG 9/10 Echo >> EF 55 to 60%, mild LVH, PAS 32 mmHg 9/11 MR Brain > no acute finding, multifocal white matter lesions consistent with MS, inflammatory change in paranasal sinuses  SIGNIFICANT EVENTS: 9/10 Intubation/admission, ?seizure activity/rigors, neurology consulted  SUBJECTIVE:  Severe agitation, thick secretions, followed commands on WUA  VITAL SIGNS: Temp:  [99.5 F (37.5 C)-101.1 F (38.4 C)] 100.4 F (38 C) (09/12 1115) Pulse Rate:  [60-135] 93 (09/12 1115) Resp:  [14-34] 16 (09/12 1115) BP: (97-152)/(50-93) 107/64 mmHg (09/12 1115) SpO2:  [94 %-96 %] 95 % (09/12 0746) FiO2 (%):  [40 %] 40 % (09/12 1100) Weight:  [98.7 kg (217 lb 9.5 oz)-101.1 kg (222 lb 14.2 oz)] 101.1 kg (222 lb 14.2 oz) (09/12 0500) VENTILATOR SETTINGS: Vent Mode:  [-] PSV;CPAP FiO2 (%):  [40 %] 40 % Set Rate:  [14 bmp] 14 bmp Vt Set:  [600 mL] 600 mL PEEP:  [5 cmH20] 5 cmH20 Pressure Support:  [5 cmH20] 5 cmH20 Plateau Pressure:  [15 cmH20-16 cmH20] 16 cmH20 INTAKE / OUTPUT:  Intake/Output Summary (Last 24 hours) at 03/03/14 1143 Last data filed at 03/03/14 1100  Gross per 24 hour  Intake 2627.53 ml  Output   1335 ml  Net 1292.53 ml    PHYSICAL EXAMINATION: General: sedated heavily on vent HEENT: NCAT, EOMi, ETT PULM: rhonchi bilaterally CV: RRR, no mgr AB: BS+, soft, nontender ZJQ:BHAL no edema Neuro: heavily sedated  LABS:  CBC  Recent Labs Lab  03/01/14 1755 03/02/14 0430 03/03/14 0548  WBC 10.9* 16.5* 13.3*  HGB 12.7* 11.7* 10.0*  HCT 37.6* 34.7* 30.8*  PLT 135* 120* 130*   BMET  Recent Labs Lab 03/01/14 0249 03/01/14 1035 03/02/14 0430 03/03/14 0548  NA 144  --  141 143  K 3.6*  --  3.3* 3.4*  CL 100  --  101 104  CO2 22  --  29 30  BUN 9  --  20 27*  CREATININE 0.85 0.89 1.32 1.10  GLUCOSE 155*  --  106* 101*   Electrolytes  Recent Labs Lab 03/01/14 0249 03/02/14 0430 03/03/14 0548  CALCIUM 10.0 8.4 8.7  MG  --  1.8 2.3  PHOS  --  3.5  --    Sepsis Markers  Recent Labs Lab 03/01/14 1610 03/01/14 2210 03/02/14 0432  LATICACIDVEN 1.9 1.7 1.6   ABG  Recent Labs Lab 03/01/14 0807  PHART 7.385  PCO2ART 45.7*  PO2ART 50.0*   Liver Enzymes  Recent Labs Lab 03/01/14 0249  AST 26  ALT 28  ALKPHOS 78  BILITOT 0.7  ALBUMIN 4.4   Glucose  Recent Labs Lab 03/02/14 1212 03/02/14 1634 03/02/14 1925 03/02/14 2327 03/03/14 0343 03/03/14 0817  GLUCAP 107* 90 112* 125* 111* 116*    Imaging Mr Brain W Wo Contrast  03/02/2014   CLINICAL DATA:  Mental status changes.  Multiple sclerosis.  EXAM: MRI HEAD WITHOUT AND WITH CONTRAST  TECHNIQUE: Multiplanar, multiecho pulse sequences of the brain and  surrounding structures were obtained without and with intravenous contrast.  CONTRAST:  20 cc MultiHance  COMPARISON:  Head CT 03/01/2014  FINDINGS: Diffusion imaging does not show any acute or subacute infarction. The brainstem and cerebellum are normal. Within the cerebral hemispheres, there are scattered foci of T2 and FLAIR signal within the deep white matter. Given the clinical history multiple sclerosis, these lesions are consistent with foci of demyelination. They could not be distinguished from chronic small-vessel ischemic change. No cortical or large vessel territory insult. No mass lesion, hemorrhage, hydrocephalus or subdural collection. There is a 1 cm arachnoid cyst in the cistern of the  velum interpositum dorsal to the thalami. This is not of clinical relevance. After contrast administration, no abnormal enhancement occurs. No pituitary mass. There are mucosal inflammatory changes throughout the paranasal sinuses.  IMPRESSION: No acute brain finding. No restricted diffusion or contrast enhancement.  Multifocal white matter lesions consistent with the clinical diagnosis of multiple sclerosis. The differential diagnosis would include chronic small-vessel ischemic changes.  Mucosal inflammatory changes throughout the paranasal sinuses.   Electronically Signed   By: Nelson Chimes M.D.   On: 03/02/2014 16:46   Dg Chest Port 1 View  03/03/2014   CLINICAL DATA:  48 year old male -followup pneumonia.  EXAM: PORTABLE CHEST - 1 VIEW  FINDINGS: An endotracheal tube with tip 5.7 cm above the carina and NG tube entering the stomach with tip off the field of view again noted.  A left IJ central venous catheter is again identified with tip projecting slightly to the right of the expected location of the lower SVC but correlate clinically.  Pulmonary vascular congestion and interstitial mild airspace opacities again noted.  There is no evidence of pneumothorax. Lung volumes of slightly decreased since the prior study.  IMPRESSION: No significant change from the prior study except for slightly decreasing lung volumes.  Support apparatus as described. Left IJ central venous catheter with tip projecting slightly to the right of the expected lower SVC location - likely within the lower SVC but correlate clinically.  Pulmonary vascular congestion and unchanged probable bilateral pulmonary edema.   Electronically Signed   By: Hassan Rowan M.D.   On: 03/03/2014 09:18   Dg Chest Port 1 View  03/02/2014   CLINICAL DATA:  Intubation.  EXAM: PORTABLE CHEST - 1 VIEW  COMPARISON:  03/01/2014.  FINDINGS: Endotracheal tube, NG tube, left IJ line in stable position. Cardiomegaly with bilateral pulmonary alveolar infiltrates  consistent with pulmonary edema noted. Pneumonia cannot be excluded. Small left pleural effusion cannot be excluded. No pneumothorax. No acute bony abnormality.  IMPRESSION: 1. Line and tube positions stable. 2. Cardiomegaly with bilateral pulmonary alveolar infiltrates consistent with pulmonary edema. Pneumonia cannot be excluded. 3. Small left pleural effusion cannot be excluded.   Electronically Signed   By: Marcello Moores  Register   On: 03/02/2014 07:22     ASSESSMENT / PLAN:  NEUROLOGIC A:   Acute encephalopathy with combative behavior > suspect polypharmacy but etiology uncertain >> Slowly improving, MRI brain OK Hx of multiple sclerosis. P:   RASS goal: -1 PAD 3 Hold outpt xanax, ampyra, tysabri, aricept, marinol, neurontin, ritalin, provigil, oxycodone, requip >> defer to neurology if/when to resume these Continue baclofen to prevent withdrawal Fentanyl gtt, propofol gtt  PULMONARY OETT 9/10 >> A:  Acute respiratory failure 2nd to altered mental status/airway protection and recurrent aspiration pneumonia. Hx of chronic hypercapnic respiratory failure. P:  Pressure support wean as tolerated >> not ready for extubation trial  yet F/u CXR Adjust oxygen to keep SpO2 > 92%  CARDIOVASCULAR Lt IJ CVL 9/10 >> A:  Sinus tachycardia from agitation/pneumonia/sepsis >> improved 9/12. Hx of HTN, hyperlipidemia. P:  Monitor hemodynamics Hold outpt tenormin Crestor, lipitor in outpt med list >> hold both for now Goal even fluid balance  RENAL A:   Hypokalemia. Hx of BPH. P:   F/u and replace electrolytes as needed Monitor renal fx, urine outpt Hold cardura   GASTROINTESTINAL A:   Nutrition. Hx of GERD. P:   NPO Protonix bid (home dose) Tube feeds while on vent  HEMATOLOGIC A:   Leukocytosis in setting of aspiration pneumonia. P:  F/u CBC SQ heparin for DVT prevention  INFECTIOUS A:   Klebsiella pneumonia with sepsis Blood 9/10 >> Urine 9/10 >> Sputum 9/10 >>  klebsiella  P:   Stop vanc/zosyn Ceftriaxone 9/12 day 3/7   ENDOCRINE A:   No acute issues. P:   Monitor CBG's   TODAY'S SUMMARY:  VDRF from recurrent aspiration, polypharmacy in setting of MS.  Mental status improving (following commands) but gets very agitated requiring sedation; not ready for extubation due to pneumonia/heavy secretions  CC time 36 minutes.  Roselie Awkward, MD [place] [facility] Pager: [phone] Cell: [phone] If no response, call [phone]

## 2014-03-04 ENCOUNTER — Inpatient Hospital Stay (HOSPITAL_COMMUNITY): Payer: Medicare Other

## 2014-03-04 DIAGNOSIS — K2100 Gastro-esophageal reflux disease with esophagitis, without bleeding: Secondary | ICD-10-CM

## 2014-03-04 LAB — BASIC METABOLIC PANEL
ANION GAP: 9 (ref 5–15)
Anion gap: 9 (ref 5–15)
BUN: 26 mg/dL — AB (ref 6–23)
BUN: 23 mg/dL (ref 6–23)
CALCIUM: 8.9 mg/dL (ref 8.4–10.5)
CHLORIDE: 105 meq/L (ref 96–112)
CO2: 28 meq/L (ref 19–32)
CO2: 29 mEq/L (ref 19–32)
CREATININE: 0.9 mg/dL (ref 0.50–1.35)
Calcium: 8.5 mg/dL (ref 8.4–10.5)
Chloride: 106 mEq/L (ref 96–112)
Creatinine, Ser: 0.87 mg/dL (ref 0.50–1.35)
GFR calc Af Amer: >90 mL/min (ref >90)
GFR calc non Af Amer: >90 mL/min (ref >90)
GLUCOSE: 98 mg/dL (ref 70–99)
Glucose, Bld: 98 mg/dL (ref 70–99)
Potassium: 3.5 mEq/L — ABNORMAL LOW (ref 3.7–5.3)
Potassium: 3.5 mEq/L — ABNORMAL LOW (ref 3.7–5.3)
Sodium: 142 mEq/L (ref 137–147)
Sodium: 144 mEq/L (ref 137–147)

## 2014-03-04 LAB — GLUCOSE, CAPILLARY
GLUCOSE-CAPILLARY: 97 mg/dL (ref 70–99)
Glucose-Capillary: 102 mg/dL — ABNORMAL HIGH (ref 70–99)
Glucose-Capillary: 106 mg/dL — ABNORMAL HIGH (ref 70–99)
Glucose-Capillary: 88 mg/dL (ref 70–99)
Glucose-Capillary: 95 mg/dL (ref 70–99)
Glucose-Capillary: 97 mg/dL (ref 70–99)
Glucose-Capillary: 99 mg/dL (ref 70–99)

## 2014-03-04 LAB — GRAM STAIN: Gram Stain: NONE SEEN

## 2014-03-04 LAB — CBC
HEMATOCRIT: 29.1 % — AB (ref 39.0–52.0)
Hemoglobin: 9.5 g/dL — ABNORMAL LOW (ref 13.0–17.0)
MCH: 30.9 pg (ref 26.0–34.0)
MCHC: 32.6 g/dL (ref 30.0–36.0)
MCV: 94.8 fL (ref 78.0–100.0)
Platelets: 146 10*3/uL — ABNORMAL LOW (ref 150–400)
RBC: 3.07 MIL/uL — ABNORMAL LOW (ref 4.22–5.81)
RDW: 14.5 % (ref 11.5–15.5)
WBC: 9.4 10*3/uL (ref 4.0–10.5)

## 2014-03-04 LAB — CSF CELL COUNT WITH DIFFERENTIAL
Eosinophils, CSF: 0 % (ref 0–1)
Eosinophils, CSF: 0 % (ref 0–1)
RBC COUNT CSF: 1 /mm3 — AB
RBC Count, CSF: 0 /mm3
TUBE #: 4
Tube #: 1
WBC, CSF: 0 /mm3 (ref 0–5)
WBC, CSF: 1 /mm3 (ref 0–5)

## 2014-03-04 LAB — PROTEIN AND GLUCOSE, CSF
GLUCOSE CSF: 64 mg/dL (ref 43–76)
Total  Protein, CSF: 29 mg/dL (ref 15–45)

## 2014-03-04 LAB — CRYPTOCOCCAL ANTIGEN, CSF: Crypto Ag: NEGATIVE

## 2014-03-04 LAB — TRIGLYCERIDES: Triglycerides: 217 mg/dL — ABNORMAL HIGH (ref <150)

## 2014-03-04 LAB — PROTIME-INR
INR: 1.15 (ref 0.00–1.49)
Prothrombin Time: 14.7 seconds (ref 11.6–15.2)

## 2014-03-04 MED ORDER — ACETAMINOPHEN 160 MG/5ML PO SOLN
650.0000 mg | Freq: Four times a day (QID) | ORAL | Status: DC | PRN
  Administered 2014-03-04 (×2): 650 mg via ORAL
  Filled 2014-03-04 (×2): qty 20.3

## 2014-03-04 MED ORDER — MIDAZOLAM HCL 2 MG/2ML IJ SOLN
2.0000 mg | Freq: Once | INTRAMUSCULAR | Status: AC
  Administered 2014-03-04: 2 mg via INTRAVENOUS

## 2014-03-04 MED ORDER — DEXTROSE 5 % IV SOLN
800.0000 mg | Freq: Three times a day (TID) | INTRAVENOUS | Status: DC
  Administered 2014-03-04 – 2014-03-06 (×5): 800 mg via INTRAVENOUS
  Filled 2014-03-04 (×7): qty 16

## 2014-03-04 MED ORDER — POTASSIUM CHLORIDE 20 MEQ/15ML (10%) PO LIQD
20.0000 meq | ORAL | Status: AC
  Administered 2014-03-04 (×2): 20 meq
  Filled 2014-03-04 (×2): qty 15

## 2014-03-04 MED ORDER — MIDAZOLAM HCL 2 MG/2ML IJ SOLN
INTRAMUSCULAR | Status: AC
  Filled 2014-03-04: qty 4

## 2014-03-04 NOTE — Procedures (Signed)
Lumbar Puncture Procedure Note  Pre-operative Diagnosis: r/o encephalitis  Post-operative Diagnosis: r/o encephalitis, high opening pressures  Indications: Diagnostic  Procedure Details   Consent: Informed consent was obtained. Risks of the procedure were discussed including: infection, bleeding, pain and headache.  The patient was positioned under sterile conditions. Betadine solution and sterile drapes were utilized. A spinal needle was inserted at the L3 - L4 interspace.  Spinal fluid was obtained and sent to the laboratory.  Findings 47mL of clear spinal fluid was obtained. Opening Pressure: 51 cm H2O pressure.----------------------------------->HIGH OPEN pressures Closing Pressure: 18-22cm H2O pressure.  Complications:  None; patient tolerated the procedure well.        Condition: stable  Plan Bed rest for 5 hours.  Lavon Paganini. Titus Mould, MD, [place] Pgr: [place] [facility]

## 2014-03-04 NOTE — Progress Notes (Signed)
[facility] ADULT ICU REPLACEMENT PROTOCOL FOR AM LAB REPLACEMENT ONLY  The patient does apply for the [facility] Adult ICU Electrolyte Replacment Protocol based on the criteria listed below:   1. Is GFR >/= 40 ml/min? Yes.    Patient's GFR today is >90 2. Is urine output >/= 0.5 ml/kg/hr for the last 6 hours? Yes.   Patient's UOP is 0.8 ml/kg/hr 3. Is BUN < 60 mg/dL? Yes.    Patient's BUN today is 26 4. Abnormal electrolyte(s): K3.5 5. Ordered repletion with: Kcl 6. If a panic level lab has been reported, has the CCM MD in charge been notified? Yes.    Physician:  Wonda Horner 03/04/2014 5:57 AM

## 2014-03-04 NOTE — Progress Notes (Signed)
PULMONARY / CRITICAL CARE MEDICINE   Name: Randy Greene MRN: [id number] DOB: Jun 12, 1966    ADMISSION DATE:  03/01/2014 CONSULTATION DATE:  03/01/2014  REFERRING MD :  Roxanne Mins, EDP  CHIEF COMPLAINT:  Confusion  INITIAL PRESENTATION:  48 yo male former smoker with hx MS on immunosuppression, presented with severe agitation/combativeness from polypharmacy in setting of MS and recurrent aspiration pneumonia.  He required intubation for airway support and sedation.  STUDIES:  9/10 CT head >> chronic sinus disease, no other acute process 9/10 EEG >> normal sleep EEG 9/10 Echo >> EF 55 to 60%, mild LVH, PAS 32 mmHg 9/11 MR Brain > no acute finding, multifocal white matter lesions consistent with MS, inflammatory change in paranasal sinuses  SIGNIFICANT EVENTS: 9/10 Intubation/admission, ?seizure activity/rigors, neurology consulted  SUBJECTIVE:  Still requiring heavy sedation.  Easily agitated.  Tol PS wean.    VITAL SIGNS: Temp:  [98.4 F (36.9 C)-100.9 F (38.3 C)] 98.6 F (37 C) (09/13 1300) Pulse Rate:  [69-92] 86 (09/13 1300) Resp:  [12-21] 14 (09/13 1300) BP: (96-131)/(52-91) 131/80 mmHg (09/13 1300) SpO2:  [88 %-98 %] 96 % (09/13 1300) FiO2 (%):  [40 %] 40 % (09/13 1300) Weight:  [222 lb 10.6 oz (101 kg)] 222 lb 10.6 oz (101 kg) (09/13 0400) VENTILATOR SETTINGS: Vent Mode:  [-] PSV;CPAP FiO2 (%):  [40 %] 40 % Set Rate:  [14 bmp] 14 bmp Vt Set:  [600 mL] 600 mL PEEP:  [5 cmH20] 5 cmH20 Pressure Support:  [5 cmH20] 5 cmH20 Plateau Pressure:  [11 cmH20-16 cmH20] 16 cmH20 INTAKE / OUTPUT:  Intake/Output Summary (Last 24 hours) at 03/04/14 1408 Last data filed at 03/04/14 1400  Gross per 24 hour  Intake 3439.66 ml  Output   1350 ml  Net 2089.66 ml    PHYSICAL EXAMINATION: General: sedated heavily on vent, NAD  HEENT: NCAT, EOMi, ETT PULM: resps even non labored on PS, rhonchi bilaterally CV: RRR, no mgr AB: BS+, soft, nontender SWN:IOEV no edema Neuro: RASS -1 on  large doses sedation, follows simple commands but very easily agitated  LABS:  CBC  Recent Labs Lab 03/01/14 1755 03/02/14 0430 03/03/14 0548  WBC 10.9* 16.5* 13.3*  HGB 12.7* 11.7* 10.0*  HCT 37.6* 34.7* 30.8*  PLT 135* 120* 130*   BMET  Recent Labs Lab 03/02/14 0430 03/03/14 0548 03/04/14 0400  NA 141 143 142  K 3.3* 3.4* 3.5*  CL 101 104 105  CO2 29 30 28   BUN 20 27* 26*  CREATININE 1.32 1.10 0.90  GLUCOSE 106* 101* 98   Electrolytes  Recent Labs Lab 03/02/14 0430 03/03/14 0548 03/04/14 0400  CALCIUM 8.4 8.7 8.5  MG 1.8 2.3  --   PHOS 3.5  --   --    Sepsis Markers  Recent Labs Lab 03/01/14 1610 03/01/14 2210 03/02/14 0432  LATICACIDVEN 1.9 1.7 1.6   ABG  Recent Labs Lab 03/01/14 0807  PHART 7.385  PCO2ART 45.7*  PO2ART 50.0*   Liver Enzymes  Recent Labs Lab 03/01/14 0249  AST 26  ALT 28  ALKPHOS 78  BILITOT 0.7  ALBUMIN 4.4   Glucose  Recent Labs Lab 03/03/14 1536 03/03/14 1936 03/03/14 2343 03/04/14 0340 03/04/14 0737 03/04/14 1135  GLUCAP 119* 96 97 106* 99 102*    Imaging Mr Brain W Wo Contrast  03/02/2014   CLINICAL DATA:  Mental status changes.  Multiple sclerosis.  EXAM: MRI HEAD WITHOUT AND WITH CONTRAST  TECHNIQUE: Multiplanar, multiecho pulse sequences of  the brain and surrounding structures were obtained without and with intravenous contrast.  CONTRAST:  20 cc MultiHance  COMPARISON:  Head CT 03/01/2014  FINDINGS: Diffusion imaging does not show any acute or subacute infarction. The brainstem and cerebellum are normal. Within the cerebral hemispheres, there are scattered foci of T2 and FLAIR signal within the deep white matter. Given the clinical history multiple sclerosis, these lesions are consistent with foci of demyelination. They could not be distinguished from chronic small-vessel ischemic change. No cortical or large vessel territory insult. No mass lesion, hemorrhage, hydrocephalus or subdural collection.  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 This is not of clinical relevance. After contrast administration, no abnormal enhancement occurs. No pituitary mass. There are mucosal inflammatory changes throughout the paranasal sinuses.  IMPRESSION: No acute brain finding. No restricted diffusion or contrast enhancement.  Multifocal white matter lesions consistent with the clinical diagnosis of multiple sclerosis. The differential diagnosis would include chronic small-vessel ischemic changes.  Mucosal inflammatory changes throughout the paranasal sinuses.   Electronically Signed   By: Nelson Chimes M.D.   On: 03/02/2014 16:46   Dg Chest Port 1 View  03/04/2014   CLINICAL DATA:  Evaluate endotracheal tube placement.  EXAM: PORTABLE CHEST - 1 VIEW  COMPARISON:  Chest x-ray 03/03/2014.  FINDINGS: An endotracheal tube is in place with tip 3.7 cm above the carina. There is a left-sided internal jugular central venous catheter with tip terminating in the mid superior vena cava. A nasogastric tube is seen extending into the stomach, however, the tip of the nasogastric tube extends below the lower margin of the image. Lung volumes are low. Bibasilar opacities (left greater than right) may reflect areas of atelectasis and/or consolidation. Small left pleural effusion. Cephalization of the pulmonary vasculature with slight indistinctness of interstitial markings. Mild cardiomegaly. The patient is rotated to the left on today's exam, resulting in distortion of the mediastinal contours and reduced diagnostic sensitivity and specificity for mediastinal pathology.  IMPRESSION: 1. Support apparatus, as above. 2. The appearance of the lungs suggests developing mild interstitial pulmonary edema. 3. Persistent bibasilar opacities (left greater than right) may reflect areas of atelectasis and/or consolidation, with superimposed small left pleural effusion.   Electronically Signed   By:  Vinnie Langton M.D.   On: 03/04/2014 08:41   Dg Chest Port 1 View  03/03/2014   CLINICAL DATA:  48 year old male -followup pneumonia.  EXAM: PORTABLE CHEST - 1 VIEW  FINDINGS: An endotracheal tube with tip 5.7 cm above the carina and NG tube entering the stomach with tip off the field of view again noted.  A left IJ central venous catheter is again identified with tip projecting slightly to the right of the expected location of the lower SVC but correlate clinically.  Pulmonary vascular congestion and interstitial mild airspace opacities again noted.  There is no evidence of pneumothorax. Lung volumes of slightly decreased since the prior study.  IMPRESSION: No significant change from the prior study except for slightly decreasing lung volumes.  Support apparatus as described. Left IJ central venous catheter with tip projecting slightly to the right of the expected lower SVC location - likely within the lower SVC but correlate clinically.  Pulmonary vascular congestion and unchanged probable bilateral pulmonary edema.   Electronically Signed   By: Hassan Rowan M.D.   On: 03/03/2014 09:18     ASSESSMENT / PLAN:  NEUROLOGIC A:   Acute encephalopathy  with combative behavior > suspect polypharmacy but etiology uncertain.  ??encephalitis given immunosuppression.   MRI brain OK Hx of multiple sclerosis on . P:   RASS goal: -1 Cont propofol, fent gtt with WUA as able Hold outpt xanax, ampyra, tysabri, aricept, marinol, neurontin, ritalin, provigil, oxycodone, requip >> defer to neurology if/when to resume these Continue baclofen to prevent withdrawal LP -- hsv PCR, cell count with diff, culture Empiric acyclovir  PULMONARY OETT 9/10 >> A:  Acute respiratory failure 2nd to altered mental status/airway protection and recurrent aspiration pneumonia. Hx of chronic hypercapnic respiratory failure. P:  Pressure support wean as tolerated >> not ready for extubation r/t mental status  F/u CXR Adjust  oxygen to keep SpO2 > 92% abg reviewed, keep same MV Goal PS 5 cpap 5  X 2 hrs  CARDIOVASCULAR Lt IJ CVL 9/10 >> A:  Sinus tachycardia from agitation/pneumonia/sepsis >> improved 9/12. Hx of HTN, hyperlipidemia. P:  Monitor hemodynamics Hold outpt tenormin Crestor, lipitor in outpt med list >> hold both for now Goal even fluid balance  RENAL A:  ARF improving Hypokalemia. Hx of BPH. P:   F/u and replace electrolytes as needed Monitor renal fx, urine outpt Hold cardura  No lasix Continued pos balance, especially with addition acyclovir  GASTROINTESTINAL A:   Nutrition. Hx of GERD. P:  Protonix bid (home dose) Tube feeds while on vent  HEMATOLOGIC A:   Leukocytosis in setting of aspiration pneumonia. P:  F/u CBC SQ heparin for DVT prevention  INFECTIOUS A:   Klebsiella pneumonia with sepsis ??hsv encephalitis Blood 9/10 >> Urine 9/10 >>neg Sputum 9/10 >> klebsiella LP 9/13>>>  P:   Ceftriaxone 9/12>>> LP 9/13 empiric acyclovir 9/13>>>   ENDOCRINE A:   No acute issues. P:   Monitor CBG's   TODAY'S SUMMARY:  VDRF from recurrent aspiration, polypharmacy in setting of MS and encephalopathy.  Does follow commands intermittently but gets very agitated requiring sedation.  LP today, hsv PCR, empiric acyclovir.   CC time 30 minutes.  Nickolas Madrid, NP 03/04/2014  2:09 PM Pager: [phone] or [phone]  *Care during the described time interval was provided by me and/or other providers on the critical care team. I have reviewed this patient's available data, including medical history, events of note, physical examination and test results as part of my evaluation.    Lavon Paganini. Titus Mould, MD, [place] Pgr: [place] [facility]

## 2014-03-04 NOTE — Progress Notes (Signed)
ANTIBIOTIC CONSULT NOTE - FOLLOW UP  Pharmacy Consult:  acyclovir Indication:  R/o HSV encephalitis  Allergies  Allergen Reactions  . Food Nausea And Vomiting    GREEN PEPPERS- flushed  . Morphine And Related Other (See Comments)    [facility]  . Mushroom Extract Complex Nausea And Vomiting    And flushed  . Penicillins Hives    FAMILY HISTORY  . Zoloft [Sertraline Hcl] Other (See Comments)    [facility] records  . Adhesive [Tape] Rash    Patient Measurements: Height: 6\' 3"  (190.5 cm) Weight: 222 lb 10.6 oz (101 kg) IBW/kg (Calculated) : 84.5  Vital Signs: Temp: 99.9 F (37.7 C) (09/13 1400) Temp src: Core (Comment) (09/13 0400) BP: 121/70 mmHg (09/13 1400) Pulse Rate: 79 (09/13 1400) Intake/Output from previous day: 09/12 0701 - 09/13 0700 In: 3827.9 [I.V.:2345.4; NG/GT:1170; IV Piggyback:312.5] Out: 1285 [Urine:1285] Intake/Output from this shift: Total I/O In: 603.5 [I.V.:453.5; NG/GT:100; IV Piggyback:50] Out: 455 [Urine:455]  Labs:  Recent Labs  03/01/14 1755 03/02/14 0430 03/03/14 0548 03/04/14 0400  WBC 10.9* 16.5* 13.3*  --   HGB 12.7* 11.7* 10.0*  --   PLT 135* 120* 130*  --   CREATININE  --  1.32 1.10 0.90   Estimated Creatinine Clearance: 118.7 ml/min (by C-G formula based on Cr of 0.9).  Recent Labs  03/02/14 1737  [place] 18.3     Microbiology: Recent Results (from the past 720 hour(s))  CULTURE, BLOOD (ROUTINE X 2)     Status: None   Collection Time    03/01/14  3:31 AM      Result Value Ref Range Status   Specimen Description BLOOD RIGHT ARM   Final   Special Requests BOTTLES DRAWN AEROBIC AND ANAEROBIC 6ML   Final   Culture  Setup Time     Final   Value: 03/01/2014 08:13     Performed at [facility]   Culture     Final   Value:        BLOOD CULTURE RECEIVED NO GROWTH TO DATE CULTURE WILL BE HELD FOR 5 DAYS BEFORE ISSUING A FINAL NEGATIVE REPORT     Performed at [facility]   Report Status PENDING    Incomplete  MRSA PCR SCREENING     Status: None   Collection Time    03/01/14  7:25 AM      Result Value Ref Range Status   MRSA by PCR NEGATIVE  NEGATIVE Final   Comment:            The GeneXpert MRSA Assay (FDA     approved for NASAL specimens     only), is one component of a     comprehensive MRSA colonization     surveillance program. It is not     intended to diagnose MRSA     infection nor to guide or     monitor treatment for     MRSA infections.  URINE CULTURE     Status: None   Collection Time    03/01/14  8:41 AM      Result Value Ref Range Status   Specimen Description URINE, CATHETERIZED   Final   Special Requests NONE   Final   Culture  Setup Time     Final   Value: 03/01/2014 12:41     Performed at [facility]     Final   Value: NO GROWTH  Performed at [facility]     Final   Value: NO GROWTH     Performed at [facility]   Report Status 03/02/2014 FINAL   Final  CULTURE, RESPIRATORY (NON-EXPECTORATED)     Status: None   Collection Time    03/01/14  8:43 AM      Result Value Ref Range Status   Specimen Description ENDOTRACHEAL   Final   Special Requests NONE   Final   Gram Stain     Final   Value: RARE WBC PRESENT, PREDOMINANTLY PMN     RARE SQUAMOUS EPITHELIAL CELLS PRESENT     FEW GRAM NEGATIVE RODS     RARE YEAST     Performed at [facility]   Culture     Final   Value: ABUNDANT KLEBSIELLA PNEUMONIAE     Performed at [facility]   Report Status 03/03/2014 FINAL   Final   Organism ID, Bacteria KLEBSIELLA PNEUMONIAE   Final  CULTURE, BLOOD (ROUTINE X 2)     Status: None   Collection Time    03/01/14 10:01 AM      Result Value Ref Range Status   Specimen Description BLOOD RIGHT HAND   Final   Special Requests BOTTLES DRAWN AEROBIC ONLY 5CC   Final   Culture  Setup Time     Final   Value: 03/01/2014 14:25     Performed at [facility]   Culture     Final   Value:         BLOOD CULTURE RECEIVED NO GROWTH TO DATE CULTURE WILL BE HELD FOR 5 DAYS BEFORE ISSUING A FINAL NEGATIVE REPORT     Performed at [facility]   Report Status PENDING   Incomplete      Assessment: 20 YOM continues on vancomycin and Zosyn for aspiration PNA and to add acyclovir for r/o HSV encephalitis.  WBC= 13.3, tmax= 101.1, SCr= 0.8, CrCl > 100.   Vanc 9/10 >> 9/12 Zosyn 9/10 >> 9/12 rocephin 9/12>> Acyclovir 9/13>>  9/10 blood - ngtd 9/10 urine - negative 9/10 resp - klebsiella PN (resitant to ampicillin only)  Goal of Therapy:  Vancomycin trough level 15-20 mcg/ml   Plan:  -Acyclovir 800mg  IV q8h (10mg /kg IV q8h; dosing based on IBW) -Will follow renal function and clinical progress  Hildred Laser, Pharm D 03/04/2014 2:45 PM

## 2014-03-05 ENCOUNTER — Inpatient Hospital Stay (HOSPITAL_COMMUNITY): Payer: Medicare Other

## 2014-03-05 LAB — BASIC METABOLIC PANEL
Anion gap: 11 (ref 5–15)
BUN: 20 mg/dL (ref 6–23)
CHLORIDE: 106 meq/L (ref 96–112)
CO2: 28 mEq/L (ref 19–32)
CREATININE: 0.85 mg/dL (ref 0.50–1.35)
Calcium: 8.6 mg/dL (ref 8.4–10.5)
GFR calc non Af Amer: >90 mL/min (ref >90)
Glucose, Bld: 110 mg/dL — ABNORMAL HIGH (ref 70–99)
POTASSIUM: 3.1 meq/L — AB (ref 3.7–5.3)
Sodium: 145 mEq/L (ref 137–147)

## 2014-03-05 LAB — HERPES SIMPLEX VIRUS(HSV) DNA BY PCR
HSV 1 DNA: NOT DETECTED
HSV 2 DNA: NOT DETECTED

## 2014-03-05 LAB — GLUCOSE, CAPILLARY
GLUCOSE-CAPILLARY: 97 mg/dL (ref 70–99)
Glucose-Capillary: 85 mg/dL (ref 70–99)

## 2014-03-05 LAB — CLOSTRIDIUM DIFFICILE BY PCR: Toxigenic C. Difficile by PCR: NEGATIVE

## 2014-03-05 MED ORDER — PANTOPRAZOLE SODIUM 40 MG PO TBEC
40.0000 mg | DELAYED_RELEASE_TABLET | Freq: Two times a day (BID) | ORAL | Status: DC
  Administered 2014-03-06: 40 mg via ORAL
  Filled 2014-03-05: qty 1

## 2014-03-05 MED ORDER — POTASSIUM CHLORIDE 20 MEQ/15ML (10%) PO LIQD
30.0000 meq | ORAL | Status: AC
  Administered 2014-03-05 (×2): 30 meq
  Filled 2014-03-05 (×2): qty 30

## 2014-03-05 MED ORDER — LOPERAMIDE HCL 2 MG PO CAPS
4.0000 mg | ORAL_CAPSULE | Freq: Once | ORAL | Status: AC
  Administered 2014-03-05: 4 mg via ORAL
  Filled 2014-03-05: qty 2

## 2014-03-05 MED ORDER — HALOPERIDOL LACTATE 5 MG/ML IJ SOLN
5.0000 mg | INTRAMUSCULAR | Status: AC
  Administered 2014-03-05: 5 mg via INTRAVENOUS
  Filled 2014-03-05: qty 1

## 2014-03-05 MED ORDER — LORAZEPAM 2 MG/ML IJ SOLN
2.0000 mg | Freq: Once | INTRAMUSCULAR | Status: AC
  Administered 2014-03-05: 2 mg via INTRAVENOUS
  Filled 2014-03-05: qty 1

## 2014-03-05 MED ORDER — CLONIDINE HCL 0.3 MG PO TABS
0.3000 mg | ORAL_TABLET | ORAL | Status: DC
  Administered 2014-03-05 – 2014-03-06 (×11): 0.3 mg via ORAL
  Filled 2014-03-05 (×12): qty 1

## 2014-03-05 MED ORDER — FENTANYL CITRATE 0.05 MG/ML IJ SOLN: 12.5000 ug | INTRAMUSCULAR | Status: DC | PRN

## 2014-03-05 MED ORDER — DEXMEDETOMIDINE HCL IN NACL 200 MCG/50ML IV SOLN
0.4000 ug/kg/h | INTRAVENOUS | Status: DC
  Administered 2014-03-05: 1.2 ug/kg/h via INTRAVENOUS
  Administered 2014-03-05: 0.4 ug/kg/h via INTRAVENOUS
  Administered 2014-03-05: 0.8 ug/kg/h via INTRAVENOUS
  Administered 2014-03-06 (×4): 1.2 ug/kg/h via INTRAVENOUS
  Administered 2014-03-06: 0.6 ug/kg/h via INTRAVENOUS
  Administered 2014-03-06 (×2): 1.2 ug/kg/h via INTRAVENOUS
  Filled 2014-03-05: qty 50
  Filled 2014-03-05: qty 500
  Filled 2014-03-05 (×9): qty 50

## 2014-03-05 MED ORDER — LACTULOSE 10 GM/15ML PO SOLN
20.0000 g | Freq: Once | ORAL | Status: DC
  Filled 2014-03-05: qty 30

## 2014-03-05 NOTE — Evaluation (Signed)
Physical Therapy Evaluation Patient Details Name: Randy Greene MRN: [id number] DOB: 03-01-1966 Today's Date: 03/05/2014   History of Present Illness    48 yo male former smoker with hx MS on immunosuppression, presented with severe agitation/combativeness from polypharmacy in setting of MS and recurrent aspiration pneumonia. He required intubation for airway support and sedation; extubated 9/14.   Clinical Impression  Patient demonstrates deficits in functional mobility as indicated below. Will benefit from continued skilled PT to address deficits and maximize function. Will see as indicated and progress as tolerated. Patient may progress well as cognition becomes more appropriate.    Follow Up Recommendations CIR;Supervision/Assistance - 24 hour    Equipment Recommendations  Other (comment) (TBD)    Recommendations for Other Services Rehab consult     Precautions / Restrictions        Mobility  Bed Mobility Overal bed mobility: Needs Assistance Bed Mobility: Supine to Sit     Supine to sit: Min assist     General bed mobility comments: Assist for safety, poor cognition, patient required max cues to attend to task and positioning, very impuslive  Transfers Overall transfer level: Needs assistance Equipment used: 2 person hand held assist Transfers: Sit to/from Stand;Stand Pivot Transfers Sit to Stand: Min assist;+2 physical assistance (For safety) Stand pivot transfers: Min assist;+2 physical assistance       General transfer comment: Assist for safety secondary to impuslivity and poor cognitive awareness of functional ability  Ambulation/Gait                Stairs            Wheelchair Mobility    Modified Rankin (Stroke Patients Only)       Balance Overall balance assessment: Needs assistance         Standing balance support: During functional activity Standing balance-Leahy Scale: Fair                                Pertinent Vitals/Pain Pain Assessment: Faces Faces Pain Scale: Hurts a little bit Pain Descriptors / Indicators: Sore Pain Intervention(s): Monitored during session;Repositioned    Home Living Family/patient expects to be discharged to:: Private residence Living Arrangements: Spouse/significant other Available Help at Discharge: Family Type of Home: House Home Access: Stairs to enter Entrance Stairs-Rails: None Technical brewer of Steps: 2 Home Layout: One level Home Equipment: Cane - single point      Prior Function Level of Independence: Independent               Hand Dominance   Dominant Hand: Right    Extremity/Trunk Assessment               Lower Extremity Assessment: Overall WFL for tasks assessed (some coordination deficits ? is 2/2 restlessness)         Communication      Cognition Arousal/Alertness: Awake/alert Behavior During Therapy: Restless;Anxious;Impulsive Overall Cognitive Status: Impaired/Different from baseline Area of Impairment: Orientation;Attention;Memory;Following commands;Safety/judgement;Awareness;Problem solving Orientation Level: Disoriented to;Place;Time;Situation Current Attention Level: Sustained Memory: Decreased short-term memory Following Commands: Follows one step commands consistently;Follows multi-step commands inconsistently Safety/Judgement: Decreased awareness of safety;Decreased awareness of deficits Awareness: Intellectual Problem Solving: Slow processing;Decreased initiation;Difficulty sequencing;Requires verbal cues;Requires tactile cues General Comments: Per wife, congitive decline over past 2 wks prior to event of complete cognitive decline with AMS     General Comments      Exercises        Assessment/Plan  PT Assessment Patient needs continued PT services  PT Diagnosis Difficulty walking;Abnormality of gait;Acute pain;Altered mental status   PT Problem List Decreased activity  tolerance;Decreased balance;Decreased mobility;Decreased cognition;Decreased safety awareness;Pain  PT Treatment Interventions DME instruction;Gait training;Stair training;Functional mobility training;Therapeutic activities;Therapeutic exercise;Balance training;Patient/family education   PT Goals (Current goals can be found in the Care Plan section) Acute Rehab PT Goals Patient Stated Goal: to get back to baseline PT Goal Formulation: With family Time For Goal Achievement: 03/19/14 Potential to Achieve Goals: Good    Frequency Min 3X/week   Barriers to discharge        Co-evaluation               End of Session Equipment Utilized During Treatment: Gait belt Activity Tolerance: Patient tolerated treatment well (restless) Patient left: in chair;with call bell/phone within reach;with nursing/sitter in room (on [facility] with nsg in room) Nurse Communication: Mobility status         Time: 1133-1200 PT Time Calculation (min): 27 min   Charges:   PT Evaluation $Initial PT Evaluation Tier I: 1 Procedure PT Treatments $Therapeutic Activity: 8-22 mins $Self Care/Home Management: 8-22   PT G CodesDuncan Dull 03/05/2014, 12:27 PM  Alben Deeds, [place] DPT  [phone]

## 2014-03-05 NOTE — Progress Notes (Signed)
NEURO HOSPITALIST PROGRESS NOTE   SUBJECTIVE:                                                                                                                        Intubated on the vent, on propofol, but alert and awake and following commands. LP done yesterday revealed very high CSF opening pressure (51 cm H2 0 ) with normal cell count, protein, and glucose. Negative cryptococcal antigen. HSV, WNV pending. Started empirically on acyclovir.  OBJECTIVE:                                                                                                                           Vital signs in last 24 hours: Temp:  [98.6 F (37 C)-100.6 F (38.1 C)] 100.2 F (37.9 C) (09/14 0700) Pulse Rate:  [76-99] 82 (09/14 0748) Resp:  [12-38] 13 (09/14 0748) BP: (109-133)/(62-82) 119/77 mmHg (09/14 0748) SpO2:  [88 %-99 %] 96 % (09/14 0748) FiO2 (%):  [40 %] 40 % (09/14 0748) Weight:  [102.8 kg (226 lb 10.1 oz)] 102.8 kg (226 lb 10.1 oz) (09/14 0400)  Intake/Output from previous day: 09/13 0701 - 09/14 0700 In: 3575.9 [I.V.:2479.9; NG/GT:730; IV Piggyback:366] Out: 9562 [Urine:1745] Intake/Output this shift:   Nutritional status: NPO  Past Medical History  Diagnosis Date  . MS (multiple sclerosis)   . Hypertension   . Enlarged prostate   . Acid reflux   . Aspiration pneumonia   . Hypercapnic respiratory failure, chronic    Neurologic Exam:  Mental status: sedated with propofol but quite alert and awake and is able to follow simple commands.  CN 2-12: pupils 2 mm, reactive. No gaze preference. EOM appropriate. No nystagmus. No frank facial weakness. Tongue: intubated.  Motor: moves upper extremities upon painful stimuli.  Sensory: no tested  DTR's: 1 all over.  Plantars: upgoing.  Coordination and gait: unable to test   Lab Results: No results found for this basename: cbc, bmp, coags, chol, tri, ldl, hga1c   Lipid Panel  Recent Labs   03/04/14 0400  TRIG 217*    Studies/Results: Dg Chest Port 1 View  03/04/2014   CLINICAL DATA:  Evaluate endotracheal tube placement.  EXAM: PORTABLE CHEST - 1 VIEW  COMPARISON:  Chest x-ray 03/03/2014.  FINDINGS: An endotracheal tube is in place with tip 3.7 cm above the carina. There is a left-sided internal jugular central venous catheter with tip terminating in the mid superior vena cava. A nasogastric tube is seen extending into the stomach, however, the tip of the nasogastric tube extends below the lower margin of the image. Lung volumes are low. Bibasilar opacities (left greater than right) may reflect areas of atelectasis and/or consolidation. Small left pleural effusion. Cephalization of the pulmonary vasculature with slight indistinctness of interstitial markings. Mild cardiomegaly. The patient is rotated to the left on today's exam, resulting in distortion of the mediastinal contours and reduced diagnostic sensitivity and specificity for mediastinal pathology.  IMPRESSION: 1. Support apparatus, as above. 2. The appearance of the lungs suggests developing mild interstitial pulmonary edema. 3. Persistent bibasilar opacities (left greater than right) may reflect areas of atelectasis and/or consolidation, with superimposed small left pleural effusion.   Electronically Signed   By: Vinnie Langton M.D.   On: 03/04/2014 08:41    MEDICATIONS                                                                                                                       I have reviewed the patient's current medications.  ASSESSMENT/PLAN:                                                                                                           48 y/o with MS on disease modifying therapy with Tysabri, on multiple other medications, admitted to the NICU with agitated delirium, presumably related to polypharmacy.  MRI brain without acute abnormality and EEG unremarkable. CSF looks no infectious but significantly  elevated opening pressure of unclear etiology. He gets agitated but otherwise his mental status is much improved.  Except for agitation, no other clinical evidence of increase ICP and previous neurological work up shows no cause for such elevated CSF pressure. Was he agitated at the time of the tap?Marland Kitchen If that is not the case, will need to repeat MRI/MRV brain, EEG, and then decide if high volume (30 cc) tap is required. Will not consider osmotherapy or steroids at this time (don't know the etiology of increase CSF pressure) Agree with empiric trial of acyclovir pending results HSV PCR.   Dorian Pod, MD [facility] [phone]  03/05/2014, 8:00 AM

## 2014-03-05 NOTE — Progress Notes (Signed)
UR completed. Pt eval completed, recommending CIR at d/c. Await OT eval.   Sandi Mariscal, RN BSN [facility] ICU Case Manager [phone]

## 2014-03-05 NOTE — Progress Notes (Signed)
40 mL fentanyl wasted in sink, Gilford Rile, Therapist, sports as witness. Lenon Oms 03/05/2014 11:17 AM

## 2014-03-05 NOTE — Progress Notes (Addendum)
Still agitated despite haldol 5mg  IV x 1 and starting precedex  COWS score is 24 and c/w moderte severity opioid withdrawal - does not tachycardia or mydriasis or pilo-erection per RN but is yawning, very restless, c/p pain and having multiple episodes of diarrhea and now even nausea  Patient review of home meds show he is on NALTREXONE - opioid antagonist; he is not able to say which one. Per Wife told RN that he takes naltrexone for MS (off label) and he takes oxycodone  Plan  - no opioid agnoist in setting of withdrawal related to opioid antagonist   - clonidine 0.1 to 0.3 mg orally every hour with monitoring of blood pressure and heart rate - for anxiet piece  - ativan for anxiety prn  - continue precedex  - Loperamide  4 mg orally, followed by 2 mg every loose stool; if fails start octreotide   - last resort will use methaone 10mg  IV/IM   Dr. Brand Males, M.D., [facility].C.P Pulmonary and Critical Care Medicine Staff Physician [facility] Pager: [phone], If no answer or between  15:00h - 7:00h: call [phone]  03/05/2014 8:48 PM

## 2014-03-05 NOTE — Progress Notes (Addendum)
eLink Physician-Brief Progress Note Patient Name: Randy Greene DOB: March 03, 1966 MRN: [id number]   Date of Service  03/05/2014  HPI/Events of Note  1. Possible cvl dislodgement 2. Extubated 03/05/2014 but now RASS +3 and confused 3. Diarrhea  eICU Interventions  1. CXR stat for cvl position 2. Haldol 5mg  IV stat (Qtc 0.42 per rN) and start precedex gtt. Sitter to bedside 3. Dc lactulose     Intervention Category Intermediate Interventions: Change in mental status - evaluation and management  Oria Klimas 03/05/2014, 6:05 PM

## 2014-03-05 NOTE — Progress Notes (Signed)
Rehab Admissions Coordinator Note:  Patient was screened by Cleatrice Burke for appropriateness for an Inpatient Acute Rehab Consult per PT recommendation.  At this time, we are recommending Inpatient Rehab consult. Please place order.  Cleatrice Burke 03/05/2014, 1:22 PM  I can be reached at [phone].

## 2014-03-05 NOTE — Progress Notes (Signed)
PULMONARY / CRITICAL CARE MEDICINE   Name: Randy Greene MRN: [id number] DOB: Dec 25, 1965    ADMISSION DATE:  03/01/2014 CONSULTATION DATE:  03/01/2014  REFERRING MD :  Roxanne Mins, EDP  CHIEF COMPLAINT:  Confusion  INITIAL PRESENTATION:  48 yo male former smoker with hx MS on immunosuppression, presented with severe agitation/combativeness from polypharmacy in setting of MS and recurrent aspiration pneumonia.  He required intubation for airway support and sedation.  STUDIES:  9/10 CT head >> chronic sinus disease, no other acute process 9/10 EEG >> normal sleep EEG 9/10 Echo >> EF 55 to 60%, mild LVH, PAS 32 mmHg 9/11 MR Brain > no acute finding, multifocal white matter lesions consistent with MS, inflammatory change in paranasal sinuses 9/13 LP  revealed very high CSF opening pressure (51 cm H2 0 ) with normal cell count, protein, and glucose   SIGNIFICANT EVENTS: 9/10 Intubation/admission, ?seizure activity/rigors, neurology consulted  SUBJECTIVE:  Low gr fever Still requiring heavy sedation.   Easily agitated.  Tolerates PS wean.    VITAL SIGNS: Temp:  [98.6 F (37 C)-100.6 F (38.1 C)] 100.2 F (37.9 C) (09/14 0900) Pulse Rate:  [76-102] 102 (09/14 0900) Resp:  [12-38] 23 (09/14 0900) BP: (109-148)/(62-82) 148/79 mmHg (09/14 0900) SpO2:  [91 %-99 %] 96 % (09/14 0900) FiO2 (%):  [40 %] 40 % (09/14 0800) Weight:  [102.8 kg (226 lb 10.1 oz)] 102.8 kg (226 lb 10.1 oz) (09/14 0400) VENTILATOR SETTINGS: Vent Mode:  [-] PSV;CPAP FiO2 (%):  [40 %] 40 % Set Rate:  [14 bmp] 14 bmp Vt Set:  [600 mL] 600 mL PEEP:  [5 cmH20] 5 cmH20 Pressure Support:  [5 cmH20] 5 cmH20 Plateau Pressure:  [13 cmH20-16 cmH20] 16 cmH20 INTAKE / OUTPUT:  Intake/Output Summary (Last 24 hours) at 03/05/14 0957 Last data filed at 03/05/14 0900  Gross per 24 hour  Intake 3779.36 ml  Output   1920 ml  Net 1859.36 ml    PHYSICAL EXAMINATION: General: sedated on vent, NAD  HEENT: NCAT, EOMi,  ETT PULM: resps even non labored on PS, rhonchi bilaterally CV: RRR, no mgr AB: BS+, soft, nontender WCB:JSEG no edema Neuro: RASS -1 on large doses sedation, follows simple commands but very easily agitated  LABS:  CBC  Recent Labs Lab 03/02/14 0430 03/03/14 0548 03/04/14 1420  WBC 16.5* 13.3* 9.4  HGB 11.7* 10.0* 9.5*  HCT 34.7* 30.8* 29.1*  PLT 120* 130* 146*   BMET  Recent Labs Lab 03/04/14 0400 03/04/14 1420 03/05/14 0435  NA 142 144 145  K 3.5* 3.5* 3.1*  CL 105 106 106  CO2 28 29 28   BUN 26* 23 20  CREATININE 0.90 0.87 0.85  GLUCOSE 98 98 110*   Electrolytes  Recent Labs Lab 03/02/14 0430 03/03/14 0548 03/04/14 0400 03/04/14 1420 03/05/14 0435  CALCIUM 8.4 8.7 8.5 8.9 8.6  MG 1.8 2.3  --   --   --   PHOS 3.5  --   --   --   --    Sepsis Markers  Recent Labs Lab 03/01/14 1610 03/01/14 2210 03/02/14 0432  LATICACIDVEN 1.9 1.7 1.6   ABG  Recent Labs Lab 03/01/14 0807  PHART 7.385  PCO2ART 45.7*  PO2ART 50.0*   Liver Enzymes  Recent Labs Lab 03/01/14 0249  AST 26  ALT 28  ALKPHOS 78  BILITOT 0.7  ALBUMIN 4.4   Glucose  Recent Labs Lab 03/04/14 1135 03/04/14 1556 03/04/14 1954 03/04/14 2348 03/05/14 0300 03/05/14 [place]  102* 95 88 97 85 97    Imaging Dg Chest Port 1 View  03/04/2014   CLINICAL DATA:  Evaluate endotracheal tube placement.  EXAM: PORTABLE CHEST - 1 VIEW  COMPARISON:  Chest x-ray 03/03/2014.  FINDINGS: An endotracheal tube is in place with tip 3.7 cm above the carina. There is a left-sided internal jugular central venous catheter with tip terminating in the mid superior vena cava. A nasogastric tube is seen extending into the stomach, however, the tip of the nasogastric tube extends below the lower margin of the image. Lung volumes are low. Bibasilar opacities (left greater than right) may reflect areas of atelectasis and/or consolidation. Small left pleural effusion. Cephalization of the pulmonary  vasculature with slight indistinctness of interstitial markings. Mild cardiomegaly. The patient is rotated to the left on today's exam, resulting in distortion of the mediastinal contours and reduced diagnostic sensitivity and specificity for mediastinal pathology.  IMPRESSION: 1. Support apparatus, as above. 2. The appearance of the lungs suggests developing mild interstitial pulmonary edema. 3. Persistent bibasilar opacities (left greater than right) may reflect areas of atelectasis and/or consolidation, with superimposed small left pleural effusion.   Electronically Signed   By: Vinnie Langton M.D.   On: 03/04/2014 08:41     ASSESSMENT / PLAN:  NEUROLOGIC A:   Acute encephalopathy with combative behavior > suspect polypharmacy but etiology uncertain.  ??encephalitis given immunosuppression.   MRI brain OK Hx of multiple sclerosis on . P:   RASS goal:0 dc propofol, fent gtt with WUA as able -use precedex gtt after extubation if needed Hold outpt xanax, ampyra, tysabri, aricept, marinol, neurontin, ritalin, provigil, oxycodone, requip >> defer to neurology if/when to resume these Continue baclofen to prevent withdrawal Until  hsv PCR back, ct Empiric acyclovir  PULMONARY OETT 9/10 >> A:  Acute respiratory failure 2nd to altered mental status/airway protection and recurrent aspiration pneumonia. Hx of chronic hypercapnic respiratory failure. P:  Proceed with extubation   CARDIOVASCULAR Lt IJ CVL 9/10 >> A:  Sinus tachycardia from agitation/pneumonia/sepsis >> improved 9/12. Hx of HTN, hyperlipidemia. P:  Monitor hemodynamics Hold outpt tenormin Crestor, lipitor in outpt med list >> hold both for now Goal even fluid balance  RENAL A:  AKI - resolved Hypokalemia. Hx of BPH. P:   F/u and replace electrolytes as needed Monitor renal fx, urine outpt Hold cardura  No lasix  GASTROINTESTINAL A:   Nutrition. Hx of GERD. P:  Protonix bid (home dose) Hold Tube feeds    HEMATOLOGIC A:   Leukocytosis in setting of aspiration pneumonia. P:  F/u CBC SQ heparin for DVT prevention  INFECTIOUS A:   Klebsiella pneumonia with sepsis ??hsv encephalitis Blood 9/10 >> Urine 9/10 >>neg Sputum 9/10 >> klebsiella LP 9/13>>>  P:   Ceftriaxone 9/12>>> empiric acyclovir 9/13>>>   ENDOCRINE A:   No acute issues. P:   Monitor CBG's   TODAY'S SUMMARY:  VDRF from recurrent aspiration, polypharmacy in setting of MS and encephalopathy.  On empiric acyclovir.  Proceed with extubation, then reassess mental status  Care during the described time interval was provided by me and/or other providers on the critical care team.  I have reviewed this patient's available data, including medical history, events of note, physical examination and test results as part of my evaluation  CC time x 57m  Kara Mead MD. FCCP. [facility] Pager [phone] If no response call [phone]     03/05/2014  9:57 AM

## 2014-03-05 NOTE — Procedures (Signed)
Extubation Procedure Note  Patient Details:   Name: Randy Greene DOB: [birth year] MRN: [id number]   Airway Documentation:     Evaluation  O2 sats: stable throughout Complications: No apparent complications Patient did tolerate procedure well. Bilateral Breath Sounds: Rhonchi;Diminished Suctioning: Airway Patient extubated to 4 lpm nasal cannula.  Positive cuff leak noted.  Patient able to vocalize post extubation.   Catha Brow 03/05/2014, 10:40 AM

## 2014-03-05 NOTE — Progress Notes (Signed)
[facility] ADULT ICU REPLACEMENT PROTOCOL FOR AM LAB REPLACEMENT ONLY  The patient does apply for the [facility] Adult ICU Electrolyte Replacment Protocol based on the criteria listed below:   1. Is GFR >/= 40 ml/min? Yes.    Patient's GFR today is >90 2. Is urine output >/= 0.5 ml/kg/hr for the last 6 hours? Yes.   Patient's UOP is 1.03 ml/kg/hr 3. Is BUN < 60 mg/dL? Yes.    Patient's BUN today is 20 4. Abnormal electrolyte(s):K3.1 5. Ordered repletion with: Kcl 6. If a panic level lab has been reported, has the CCM MD in charge been notified? Yes.  .   Physician:  E Deterding,MD  [place], [place] 03/05/2014 5:44 AM

## 2014-03-05 NOTE — Consult Note (Signed)
Physical Medicine and Rehabilitation Consult Reason for Consult: MS/severe agitation from polypharmacy Referring Physician: [facility]   HPI: Randy Greene is a 48 y.o. right-handed male with history of multiple sclerosis on Tysabri. By report patient lives with significant other was independent with a straight point cane prior to admission. Admitted 03/01/2014 with altered mental status as well as combativeness. Patient required heavy sedation and intubation due to combativeness in the ED. MRI of the brain with no acute findings. Multifocal white matter lesions consistent with multiple sclerosis. Urine drug screen positive marijuana. Urine study negative. Echocardiogram with ejection fraction of 28% grade 1 diastolic dysfunction. EEG was unremarkable. Lumbar puncture completed 03/04/2014 revealing very high CSF opening pressure was normal cell count, protein and glucose. Chest x-ray with cardiomegaly with bilateral pulmonary alveolar infiltrates maintained on broad-spectrum antibiotics. Subcutaneous heparin for DVT prophylaxis. Neurology services consulted question altered mental status presumably related to polypharmacy. Workup presently ongoing with bouts of confusion agitation and has received Haldol with a sitter at bedside for safety.. Patient was extubated 03/05/2014. Physical therapy evaluation completed 03/05/2014 with recommendations of physical medicine rehabilitation consult.   Review of Systems  Unable to perform ROS: mental acuity   Past Medical History  Diagnosis Date  . MS (multiple sclerosis)   . Hypertension   . Enlarged prostate   . Acid reflux   . Aspiration pneumonia   . Hypercapnic respiratory failure, chronic    Past Surgical History  Procedure Laterality Date  . Hernia repair    . Arthroscopic     No family history on file. Social History:  reports that he quit smoking about 7 years ago. His smoking use included Cigarettes. He smoked 1.00 pack per day. He has  never used smokeless tobacco. He reports that he does not drink alcohol or use illicit drugs. Allergies:  Allergies  Allergen Reactions  . Food Nausea And Vomiting    GREEN PEPPERS- flushed  . Morphine And Related Other (See Comments)    [facility]  . Mushroom Extract Complex Nausea And Vomiting    And flushed  . Penicillins Hives    FAMILY HISTORY  . Zoloft [Sertraline Hcl] Other (See Comments)    [facility] records  . Adhesive [Tape] Rash   Medications Prior to Admission  Medication Sig Dispense Refill  . albuterol (PROVENTIL HFA;VENTOLIN HFA) 108 (90 BASE) MCG/ACT inhaler Inhale 1 puff into the lungs 4 (four) times daily as needed for shortness of breath (cough).       Marland Kitchen albuterol (PROVENTIL) (2.5 MG/3ML) 0.083% nebulizer solution Take 2.5 mg by nebulization 4 (four) times daily as needed for wheezing.      Marland Kitchen ALPRAZolam (XANAX) 1 MG tablet Take 0.5-1 mg by mouth at bedtime as needed for anxiety or sleep.       Marland Kitchen atenolol (TENORMIN) 50 MG tablet Take 50 mg by mouth daily.       Marland Kitchen atorvastatin (LIPITOR) 40 MG tablet Take 20 mg by mouth at bedtime.      . B COMPLEX VITAMINS SL Place 1 each under the tongue daily. 1-2 dropperfuls every morning      . baclofen (LIORESAL) 10 MG tablet Take 5-10 mg by mouth 3 (three) times daily. Take 1/2 tablet (5mg ) by mouth twice daily and take 1 tablet (10mg ) at bedtime      . Calcium Carbonate 500 MG CHEW Chew 3 tablets by mouth daily. To reduce cramps      . cetirizine (ZYRTEC) 10  MG tablet Take 10 mg by mouth daily.       Marland Kitchen dalfampridine (AMPYRA) 10 MG TB12 Take 10 mg by mouth every 12 (twelve) hours.       . diclofenac sodium (VOLTAREN) 1 % GEL Apply 1 application topically 2 (two) times daily. Apply small amount to affected area twice daily for pain      . donepezil (ARICEPT) 10 MG tablet Take 10 mg by mouth daily.       Marland Kitchen doxazosin (CARDURA) 4 MG tablet Take 2 mg by mouth at bedtime. Do not take the same day with viagra, can cause heart attack by  dropping blood pressure      . dronabinol (MARINOL) 2.5 MG capsule Take 5-7.5 mg by mouth 3 (three) times daily. Take 3 capsules (7.5 mg) every morning and every evening, take 2 capsules (5 mg) daily at noon      . escitalopram (LEXAPRO) 10 MG tablet Take 10 mg by mouth daily. For mental health      . ferrous sulfate 325 (65 FE) MG tablet Take 325 mg by mouth 2 (two) times daily with a meal.      . hydrochlorothiazide (HYDRODIURIL) 25 MG tablet Take 25 mg by mouth daily. For fluid and blood pressure      . imiquimod (ALDARA) 5 % cream Apply 1 application topically 3 (three) times a week. Apply thin layer to affected area      . lactobacillus acidophilus (BACID) TABS Take 2 tablets by mouth 3 (three) times daily.       Marland Kitchen lidocaine (LIDODERM) 5 % Place 1 patch onto the skin daily as needed (for pain). Remove & Discard patch within 12 hours or as directed by MD      . Magnesium Oxide 420 MG TABS Take 840 mg by mouth daily. To reduce muscle cramps      . methylphenidate (RITALIN) 10 MG tablet Take 10 mg by mouth 2 (two) times daily with breakfast and lunch.       . modafinil (PROVIGIL) 200 MG tablet Take 200 mg by mouth 2 (two) times daily. Take one tablet in the morning and at noon. Take with ritalin      . naltrexone (DEPADE) 50 MG tablet Take 5 mg by mouth daily. [facility] cut tablet in half (25 mg) and then cut the half tablets in 5 pieces (5 mg)      . naproxen (NAPROSYN) 500 MG tablet Take 500 mg by mouth 2 (two) times daily as needed (knee pain). Take with food.      . Natalizumab (TYSABRI IV) Inject into the vein every 21 ( twenty-one) days.       . ondansetron (ZOFRAN) 8 MG tablet Take 8 mg by mouth every 8 (eight) hours as needed for nausea.      Marland Kitchen OVER THE COUNTER MEDICATION Take 1 each by mouth daily. Liquid Vitamin D3 or D2 - approx 30 mls daily      . oxycodone (OXY-IR) 5 MG capsule Take 10 mg by mouth 4 (four) times daily. scheduled      . pantoprazole (PROTONIX) 40 MG tablet Take 40 mg  by mouth 2 (two) times daily before a meal.       . potassium chloride (K-DUR,KLOR-CON) 10 MEQ tablet Take 10 mEq by mouth daily as needed (low potassium).       . pregabalin (LYRICA) 50 MG capsule Take 50 mg by mouth 3 (three) times daily.      Marland Kitchen  ranitidine (ZANTAC) 150 MG tablet Take 150 mg by mouth 2 (two) times daily.      Marland Kitchen rOPINIRole (REQUIP) 0.5 MG tablet Take 0.5 mg by mouth at bedtime. Take 1-2 tablets (0.5 mg) 1-2 hours before bedtime for restless legs      . tadalafil (CIALIS) 20 MG tablet Take 20 mg by mouth daily as needed for erectile dysfunction. Take 1 hour before sexual activity - do not exceed 1 dose in 24 hour period. Do not take same day with doxazosin.      Marland Kitchen metoCLOPramide (REGLAN) 10 MG tablet Take 10 mg by mouth at bedtime.        Home: Home Living Family/patient expects to be discharged to:: Private residence Living Arrangements: Spouse/significant other Available Help at Discharge: Family Type of Home: House Home Access: Stairs to enter Technical brewer of Steps: 2 Entrance Stairs-Rails: None Home Layout: One level [place] - single point  Functional History: Prior Function Level of Independence: Independent Functional Status:  Mobility: Bed Mobility Overal bed mobility: Needs Assistance Bed Mobility: Supine to Sit Supine to sit: Min assist General bed mobility comments: Assist for safety, poor cognition, patient required max cues to attend to task and positioning, very impuslive Transfers Overall transfer level: Needs assistance Equipment used: 2 person hand held assist Transfers: Sit to/from [employer] Sit to Stand: Min assist;+2 physical assistance (For safety) Stand pivot transfers: Min assist;+2 physical assistance General transfer comment: Assist for safety secondary to impuslivity and poor cognitive awareness of functional ability      ADL:    Cognition: Cognition Overall Cognitive Status:  Impaired/Different from baseline Orientation Level: Oriented to person;Oriented to place;Disoriented to time;Disoriented to situation Cognition Arousal/Alertness: Awake/alert Behavior During Therapy: Restless;Anxious;Impulsive Overall Cognitive Status: Impaired/Different from baseline Area of Impairment: Orientation;Attention;Memory;Following commands;Safety/judgement;Awareness;Problem solving Orientation Level: Disoriented to;Place;Time;Situation Current Attention Level: Sustained Memory: Decreased short-term memory Following Commands: Follows one step commands consistently;Follows multi-step commands inconsistently Safety/Judgement: Decreased awareness of safety;Decreased awareness of deficits Awareness: Intellectual Problem Solving: Slow processing;Decreased initiation;Difficulty sequencing;Requires verbal cues;Requires tactile cues General Comments: Per wife, congitive decline over past 2 wks prior to event of complete cognitive decline with AMS   Blood pressure 156/96, pulse 102, temperature 100.2 F (37.9 C), temperature source Core (Comment), resp. rate 16, height 6\' 3"  (1.905 m), weight 102.8 kg (226 lb 10.1 oz), SpO2 95.00%. Physical Exam  HENT:  Head: Normocephalic.  Eyes: EOM are normal.  Neck: Normal range of motion. Neck supple. No thyromegaly present.  Cardiovascular: Normal rate and regular rhythm.   Respiratory: Effort normal and breath sounds normal. No respiratory distress.  GI: Soft. Bowel sounds are normal. He exhibits no distension.  Neurological: He is alert.  Patient makes eye contact with examiner but was distracted at times. Answered biographical information. New date. hyperreflexive right more than left. Strength UE grossly 4+/5. LE: 4/5 prox to distal. No gross sensory deficits to LT or pain. Able to follow conversation, fair attention skills  Skin: Skin is warm and dry.  Psychiatric:  A little impulsive but otherwise fairly appropriate.    Results for  orders placed during the hospital encounter of 03/01/14 (from the past 24 hour(s))  CSF CELL COUNT WITH DIFFERENTIAL     Status: Abnormal   Collection Time    03/04/14  3:30 PM      Result Value Ref Range   Tube # 1 and 4, save 3     Color, CSF COLORLESS  COLORLESS   Appearance, CSF CLEAR  CLEAR   Supernatant NOT INDICATED     RBC Count, CSF 1 (*) 0 /cu mm   WBC, CSF 0  0 - 5 /cu mm   Segmented Neutrophils-CSF TOO FEW TO COUNT, SMEAR AVAILABLE FOR REVIEW  0 - 6 %   Lymphs, CSF OCCASIONAL  40 - 80 %   Monocyte-Macrophage-Spinal Fluid RARE  15 - 45 %   Eosinophils, CSF 0  0 - 1 %  CSF CULTURE     Status: None   Collection Time    03/04/14  3:30 PM      Result Value Ref Range   Specimen Description CSF     Special Requests NONE     Gram Stain       Value: CYTOSPIN SLIDE NO WBC SEEN     NO ORGANISMS SEEN     Performed at [facility]     Performed at [facility]   Culture       Value: NO GROWTH 1 DAY     Performed at [facility]   Report Status PENDING    PROTEIN AND GLUCOSE, CSF     Status: None   Collection Time    03/04/14  3:30 PM      Result Value Ref Range   Glucose, CSF 64  43 - 76 mg/dL   Total  Protein, CSF 29  15 - 45 mg/dL  CRYPTOCOCCAL ANTIGEN, CSF     Status: None   Collection Time    03/04/14  3:30 PM      Result Value Ref Range   Crypto Ag NEGATIVE  NEGATIVE   Cryptococcal Ag Titer NOT INDICATED  NOT INDICATED  GRAM STAIN     Status: None   Collection Time    03/04/14  3:30 PM      Result Value Ref Range   Specimen Description CSF     Special Requests NONE     Gram Stain       Value: NO WBC SEEN     NO ORGANISMS SEEN     CYTOSPIN SLIDE   Report Status 03/04/2014 FINAL    CSF CELL COUNT WITH DIFFERENTIAL     Status: None   Collection Time    03/04/14  3:30 PM      Result Value Ref Range   Tube # 4     Color, CSF COLORLESS  COLORLESS   Appearance, CSF CLEAR  CLEAR   Supernatant NOT INDICATED     RBC Count, CSF 0  0 /cu mm     WBC, CSF 1  0 - 5 /cu mm   Segmented Neutrophils-CSF TOO FEW TO COUNT, SMEAR AVAILABLE FOR REVIEW  0 - 6 %   Lymphs, CSF OCCASIONAL  40 - 80 %   Monocyte-Macrophage-Spinal Fluid RARE  15 - 45 %   Eosinophils, CSF 0  0 - 1 %  FUNGUS CULTURE W SMEAR     Status: None   Collection Time    03/04/14  3:30 PM      Result Value Ref Range   Specimen Description CSF     Special Requests NONE     Fungal Smear       Value: NO YEAST OR FUNGAL ELEMENTS SEEN     Performed at [facility]   Culture       Value: CULTURE IN PROGRESS FOR FOUR WEEKS     Performed at [facility]   Report Status PENDING  GLUCOSE, CAPILLARY     Status: None   Collection Time    03/04/14  3:56 PM      Result Value Ref Range   Glucose-Capillary 95  70 - 99 mg/dL  GLUCOSE, CAPILLARY     Status: None   Collection Time    03/04/14  7:54 PM      Result Value Ref Range   Glucose-Capillary 88  70 - 99 mg/dL  GLUCOSE, CAPILLARY     Status: None   Collection Time    03/04/14 11:48 PM      Result Value Ref Range   Glucose-Capillary 97  70 - 99 mg/dL  GLUCOSE, CAPILLARY     Status: None   Collection Time    03/05/14  3:00 AM      Result Value Ref Range   Glucose-Capillary 85  70 - 99 mg/dL  BASIC METABOLIC PANEL     Status: Abnormal   Collection Time    03/05/14  4:35 AM      Result Value Ref Range   Sodium 145  137 - 147 mEq/L   Potassium 3.1 (*) 3.7 - 5.3 mEq/L   Chloride 106  96 - 112 mEq/L   CO2 28  19 - 32 mEq/L   Glucose, Bld 110 (*) 70 - 99 mg/dL   BUN 20  6 - 23 mg/dL   Creatinine, Ser 0.85  0.50 - 1.35 mg/dL   Calcium 8.6  8.4 - 10.5 mg/dL   GFR calc non Af Amer >90  >90 mL/min   GFR calc Af Amer >90  >90 mL/min   Anion gap 11  5 - 15  GLUCOSE, CAPILLARY     Status: None   Collection Time    03/05/14  8:21 AM      Result Value Ref Range   Glucose-Capillary 97  70 - 99 mg/dL   Comment 1 Notify RN     Comment 2 Documented in Chart    CLOSTRIDIUM DIFFICILE BY PCR     Status: None    Collection Time    03/05/14 12:18 PM      Result Value Ref Range   C difficile by pcr NEGATIVE  NEGATIVE   Dg Chest Port 1 View  03/04/2014   CLINICAL DATA:  Evaluate endotracheal tube placement.  EXAM: PORTABLE CHEST - 1 VIEW  COMPARISON:  Chest x-ray 03/03/2014.  FINDINGS: An endotracheal tube is in place with tip 3.7 cm above the carina. There is a left-sided internal jugular central venous catheter with tip terminating in the mid superior vena cava. A nasogastric tube is seen extending into the stomach, however, the tip of the nasogastric tube extends below the lower margin of the image. Lung volumes are low. Bibasilar opacities (left greater than right) may reflect areas of atelectasis and/or consolidation. Small left pleural effusion. Cephalization of the pulmonary vasculature with slight indistinctness of interstitial markings. Mild cardiomegaly. The patient is rotated to the left on today's exam, resulting in distortion of the mediastinal contours and reduced diagnostic sensitivity and specificity for mediastinal pathology.  IMPRESSION: 1. Support apparatus, as above. 2. The appearance of the lungs suggests developing mild interstitial pulmonary edema. 3. Persistent bibasilar opacities (left greater than right) may reflect areas of atelectasis and/or consolidation, with superimposed small left pleural effusion.   Electronically Signed   By: Vinnie Langton M.D.   On: 03/04/2014 08:41    Assessment/Plan: Diagnosis: toxic encephalopathy, pseudoexacerbation of MS  1. Does the need for close,  24 hr/day medical supervision in concert with the patient's rehab needs make it unreasonable for this patient to be served in a less intensive setting? Yes 2. Co-Morbidities requiring supervision/potential complications: polysubstance abuse, dysphagia 3. Due to bladder management, bowel management, safety, skin/wound care, disease management, medication administration, pain management and patient education,  does the patient require 24 hr/day rehab nursing? Yes 4. Does the patient require coordinated care of a physician, rehab nurse, PT (1-2 hrs/day, 5 days/week), OT (1-2 hrs/day, 5 days/week) and SLP (1-2 hrs/day, 5 days/week) to address physical and functional deficits in the context of the above medical diagnosis(es)? Yes Addressing deficits in the following areas: balance, endurance, locomotion, strength, transferring, bowel/bladder control, bathing, dressing, feeding, grooming, toileting, cognition, swallowing and psychosocial support 5. Can the patient actively participate in an intensive therapy program of at least 3 hrs of therapy per day at least 5 days per week? Yes 6. The potential for patient to make measurable gains while on inpatient rehab is excellent 7. Anticipated functional outcomes upon discharge from inpatient rehab are modified independent  with PT, modified independent with OT, modified independent with SLP. 8. Estimated rehab length of stay to reach the above functional goals is: 7-9 days 9. Does the patient have adequate social supports to accommodate these discharge functional goals? Yes 10. Anticipated D/C setting: Home 11. Anticipated post D/C treatments: HH therapy and Outpatient therapy 12. Overall Rehab/Functional Prognosis: good  RECOMMENDATIONS: This patient's condition is appropriate for continued rehabilitative care in the following setting: CIR Patient has agreed to participate in recommended program. Potentially Note that insurance prior authorization may be required for reimbursement for recommended care.  Comment: [facility]will ask Rehab Admissions Coordinator to follow up.   Meredith Staggers, MD, [facility] 03/06/2014     03/05/2014

## 2014-03-06 ENCOUNTER — Inpatient Hospital Stay (HOSPITAL_COMMUNITY): Payer: Medicare Other

## 2014-03-06 DIAGNOSIS — G92 Toxic encephalopathy: Secondary | ICD-10-CM

## 2014-03-06 DIAGNOSIS — G35 Multiple sclerosis: Secondary | ICD-10-CM

## 2014-03-06 DIAGNOSIS — G929 Unspecified toxic encephalopathy: Secondary | ICD-10-CM

## 2014-03-06 LAB — BLOOD GAS, ARTERIAL
Acid-Base Excess: 3.1 mmol/L — ABNORMAL HIGH (ref 0.0–2.0)
BICARBONATE: 26.3 meq/L — AB (ref 20.0–24.0)
Drawn by: 252031
O2 Content: 3 L/min
O2 Saturation: 91 %
PH ART: 7.493 — AB (ref 7.350–7.450)
PO2 ART: 57.9 mmHg — AB (ref 80.0–100.0)
Patient temperature: 98.6
TCO2: 27.3 mmol/L (ref 0–100)
pCO2 arterial: 34.6 mmHg — ABNORMAL LOW (ref 35.0–45.0)

## 2014-03-06 LAB — BASIC METABOLIC PANEL
Anion gap: 13 (ref 5–15)
Anion gap: 14 (ref 5–15)
BUN: 13 mg/dL (ref 6–23)
BUN: 11 mg/dL (ref 6–23)
CO2: 26 mEq/L (ref 19–32)
CO2: 27 mEq/L (ref 19–32)
Calcium: 8.8 mg/dL (ref 8.4–10.5)
Calcium: 8.8 mg/dL (ref 8.4–10.5)
Chloride: 102 mEq/L (ref 96–112)
Chloride: 98 mEq/L (ref 96–112)
Creatinine, Ser: 0.73 mg/dL (ref 0.50–1.35)
Creatinine, Ser: 0.72 mg/dL (ref 0.50–1.35)
GFR calc Af Amer: >90 mL/min (ref >90)
GFR calc non Af Amer: >90 mL/min (ref >90)
GLUCOSE: 127 mg/dL — AB (ref 70–99)
Glucose, Bld: 144 mg/dL — ABNORMAL HIGH (ref 70–99)
POTASSIUM: 2.8 meq/L — AB (ref 3.7–5.3)
Potassium: 3.1 mEq/L — ABNORMAL LOW (ref 3.7–5.3)
SODIUM: 141 meq/L (ref 137–147)
SODIUM: 139 meq/L (ref 137–147)

## 2014-03-06 LAB — CBC
HCT: 29.1 % — ABNORMAL LOW (ref 39.0–52.0)
Hemoglobin: 9.9 g/dL — ABNORMAL LOW (ref 13.0–17.0)
MCH: 30.9 pg (ref 26.0–34.0)
MCHC: 34 g/dL (ref 30.0–36.0)
MCV: 90.9 fL (ref 78.0–100.0)
PLATELETS: 152 10*3/uL (ref 150–400)
RBC: 3.2 MIL/uL — AB (ref 4.22–5.81)
RDW: 13.7 % (ref 11.5–15.5)
WBC: 11.4 10*3/uL — AB (ref 4.0–10.5)

## 2014-03-06 LAB — MAGNESIUM: Magnesium: 1.5 mg/dL (ref 1.5–2.5)

## 2014-03-06 MED ORDER — DRONABINOL 2.5 MG PO CAPS
5.0000 mg | ORAL_CAPSULE | Freq: Two times a day (BID) | ORAL | Status: DC
  Administered 2014-03-06: 5 mg via ORAL
  Filled 2014-03-06: qty 2

## 2014-03-06 MED ORDER — HYDROMORPHONE HCL PF 1 MG/ML IJ SOLN
INTRAMUSCULAR | Status: AC
  Filled 2014-03-06: qty 1

## 2014-03-06 MED ORDER — HALOPERIDOL LACTATE 5 MG/ML IJ SOLN
5.0000 mg | Freq: Once | INTRAMUSCULAR | Status: AC
  Administered 2014-03-06: 5 mg via INTRAVENOUS
  Filled 2014-03-06: qty 1

## 2014-03-06 MED ORDER — HYDROMORPHONE HCL PF 1 MG/ML IJ SOLN
1.0000 mg | Freq: Once | INTRAMUSCULAR | Status: AC
  Administered 2014-03-06: 1 mg via INTRAVENOUS

## 2014-03-06 MED ORDER — DONEPEZIL HCL 10 MG PO TABS
10.0000 mg | ORAL_TABLET | Freq: Every day | ORAL | Status: DC
  Administered 2014-03-06 – 2014-03-07 (×2): 10 mg via ORAL
  Filled 2014-03-06 (×2): qty 1

## 2014-03-06 MED ORDER — OXYCODONE HCL 5 MG PO CAPS: 5.0000 mg | ORAL_CAPSULE | Freq: Four times a day (QID) | ORAL | Status: DC

## 2014-03-06 MED ORDER — ROPINIROLE HCL 0.5 MG PO TABS
0.5000 mg | ORAL_TABLET | Freq: Every day | ORAL | Status: DC
  Administered 2014-03-06: 0.5 mg via ORAL
  Filled 2014-03-06 (×2): qty 1

## 2014-03-06 MED ORDER — MAGNESIUM OXIDE 420 MG PO TABS: 840.0000 mg | ORAL_TABLET | Freq: Every day | ORAL | Status: DC

## 2014-03-06 MED ORDER — FUROSEMIDE 10 MG/ML IJ SOLN
20.0000 mg | Freq: Once | INTRAMUSCULAR | Status: AC
  Administered 2014-03-06: 20 mg via INTRAVENOUS
  Filled 2014-03-06: qty 2

## 2014-03-06 MED ORDER — OXYCODONE HCL 5 MG PO TABS
5.0000 mg | ORAL_TABLET | Freq: Four times a day (QID) | ORAL | Status: DC
  Administered 2014-03-06 (×4): 5 mg via ORAL
  Filled 2014-03-06 (×4): qty 1

## 2014-03-06 MED ORDER — ALPRAZOLAM 0.5 MG PO TABS
0.5000 mg | ORAL_TABLET | Freq: Every evening | ORAL | Status: DC | PRN
  Administered 2014-03-06: 1 mg via ORAL
  Filled 2014-03-06: qty 2

## 2014-03-06 MED ORDER — LABETALOL HCL 5 MG/ML IV SOLN
10.0000 mg | INTRAVENOUS | Status: DC | PRN
  Administered 2014-03-06 (×2): 10 mg via INTRAVENOUS
  Filled 2014-03-06 (×2): qty 4

## 2014-03-06 MED ORDER — MAGNESIUM OXIDE 400 (241.3 MG) MG PO TABS
800.0000 mg | ORAL_TABLET | Freq: Every day | ORAL | Status: DC
  Administered 2014-03-06 – 2014-03-07 (×2): 800 mg via ORAL
  Filled 2014-03-06 (×2): qty 2

## 2014-03-06 MED ORDER — BOOST / RESOURCE BREEZE PO LIQD
1.0000 | Freq: Two times a day (BID) | ORAL | Status: DC
  Administered 2014-03-06: 1 via ORAL

## 2014-03-06 MED ORDER — ATENOLOL 50 MG PO TABS
50.0000 mg | ORAL_TABLET | Freq: Every day | ORAL | Status: DC
  Administered 2014-03-06 – 2014-03-07 (×2): 50 mg via ORAL
  Filled 2014-03-06 (×2): qty 1

## 2014-03-06 MED ORDER — PANTOPRAZOLE SODIUM 40 MG PO TBEC
40.0000 mg | DELAYED_RELEASE_TABLET | Freq: Two times a day (BID) | ORAL | Status: DC
  Administered 2014-03-06 – 2014-03-07 (×3): 40 mg via ORAL
  Filled 2014-03-06 (×3): qty 1

## 2014-03-06 MED ORDER — ESCITALOPRAM OXALATE 10 MG PO TABS
10.0000 mg | ORAL_TABLET | Freq: Every day | ORAL | Status: DC
  Administered 2014-03-06 – 2014-03-07 (×2): 10 mg via ORAL
  Filled 2014-03-06 (×2): qty 1

## 2014-03-06 MED ORDER — LABETALOL HCL 5 MG/ML IV SOLN: 10.0000 mg | INTRAVENOUS | Status: DC | PRN

## 2014-03-06 MED ORDER — PREGABALIN 50 MG PO CAPS
50.0000 mg | ORAL_CAPSULE | Freq: Three times a day (TID) | ORAL | Status: DC
  Administered 2014-03-06 (×3): 50 mg via ORAL
  Filled 2014-03-06 (×3): qty 1

## 2014-03-06 MED ORDER — DRONABINOL 5 MG PO CAPS
5.0000 mg | ORAL_CAPSULE | Freq: Two times a day (BID) | ORAL | Status: DC
  Filled 2014-03-06: qty 1

## 2014-03-06 MED ORDER — POTASSIUM CHLORIDE 10 MEQ/50ML IV SOLN
10.0000 meq | INTRAVENOUS | Status: AC
  Administered 2014-03-06 (×6): 10 meq via INTRAVENOUS
  Filled 2014-03-06 (×5): qty 50

## 2014-03-06 MED ORDER — HYDROCHLOROTHIAZIDE 25 MG PO TABS
25.0000 mg | ORAL_TABLET | Freq: Every day | ORAL | Status: DC
  Administered 2014-03-06 – 2014-03-07 (×2): 25 mg via ORAL
  Filled 2014-03-06 (×2): qty 1

## 2014-03-06 NOTE — Progress Notes (Signed)
[facility] Progress Note Patient Name: Randy Greene DOB: [birth year] MRN: [id number]   Date of Service  03/06/2014  HPI/Events of Note  Hypokalemia  eICU Interventions  Potassium replaced     Intervention Category Intermediate Interventions: Electrolyte abnormality - evaluation and management  DETERDING,ELIZABETH 03/06/2014, 5:56 AM

## 2014-03-06 NOTE — Progress Notes (Signed)
[facility] ADULT ICU REPLACEMENT PROTOCOL FOR AM LAB REPLACEMENT ONLY  The patient does not apply for the [facility] Adult ICU Electrolyte Replacment Protocol based on the criteria listed below:    Is urine output >/= 0.5 ml/kg/hr for the last 6 hours? No. Patient's UOP is 0 ml/kg/hr **  Abnormal electrolyte(s): K2.8   If a panic level lab has been reported, has the CCM MD in charge been notified? Yes.  .   Physician:  Patricia Nettle, MD  [name], [name] 03/06/2014 6:24 AM

## 2014-03-06 NOTE — Progress Notes (Signed)
NUTRITION FOLLOW-UP  DOCUMENTATION CODES Per approved criteria  -Obesity Unspecified   INTERVENTION: Resource Breeze po BID, each supplement provides 250 kcal and 9 grams of protein  NUTRITION DIAGNOSIS: Inadequate oral intake related to decreased appetite as evidenced by per pt report.   Goal: Enteral nutrition to provide 60-70% of estimated calorie needs (22-25 kcals/kg ideal body weight) and 100% of estimated protein needs, based on ASPEN guidelines for permissive underfeeding in critically ill obese individuals, NA  New Goal:  Pt to meet >/= 90% of their estimated nutrition needs   Monitor:  PO intake, supplement acceptance, weight, labs, I/O's   ASSESSMENT: 48 y/o male with multiple sclerosis brought to [facility] ED on 9/10 for severe agitation after several days of confusion from polypharmacy. He eventually required heavy sedation and intubation. Pt has recurrent aspiration PNA.   Pt extubated 9/14. Pt passed swallow evaluation and is now advanced to Regular diet. Per pt he doesn't have much of an appetite but has not yet had solid foods. Pt does not think he would like ensure but is willing to try [employer].  Pt denies any recent weight changes and had a good appetite PTA.   Potassium low.  Height: Ht Readings from Last 1 Encounters:  03/02/14 6\' 3"  (1.905 m)    Weight: Wt Readings from Last 1 Encounters:  03/06/14 225 lb 5 oz (102.2 kg)    BMI:  Body mass index is 28.16 kg/(m^2).  Estimated Nutritional Needs: Kcal: 2000-2200 Protein: 100-110 grams Fluid: >2 L/day  Skin: intact  Diet Order: General   Intake/Output Summary (Last 24 hours) at 03/06/14 1441 Last data filed at 03/06/14 1248  Gross per 24 hour  Intake 2866.92 ml  Output   2750 ml  Net 116.92 ml    Last BM: 9/14   Labs:   Recent Labs Lab 03/01/14 0249  03/02/14 0430 03/03/14 0548  03/04/14 1420 03/05/14 0435 03/06/14 0440  NA 144  --  141 143  < > 144 145 141  K 3.6*  --  3.3*  3.4*  < > 3.5* 3.1* 2.8*  CL 100  --  101 104  < > 106 106 102  CO2 22  --  29 30  < > 29 28 26   BUN 9  --  20 27*  < > 23 20 13   CREATININE 0.85  < > 1.32 1.10  < > 0.87 0.85 0.73  CALCIUM 10.0  --  8.4 8.7  < > 8.9 8.6 8.8  MG  --   --  1.8 2.3  --   --   --  1.5  PHOS  --   --  3.5  --   --   --   --   --   GLUCOSE 155*  --  106* 101*  < > 98 110* 144*  < > = values in this interval not displayed.  CBG (last 3)   Recent Labs  03/04/14 2348 03/05/14 0300 03/05/14 0821  GLUCAP 97 85 97    Scheduled Meds: . antiseptic oral rinse  7 mL Mouth Rinse QID  . atenolol  50 mg Oral Daily  . baclofen  5 mg Oral BID   And  . baclofen  10 mg Oral QHS  . cefTRIAXone (ROCEPHIN)  IV  2 g Intravenous Q24H  . chlorhexidine  15 mL Mouth Rinse BID  . donepezil  10 mg Oral Daily  . dronabinol  5 mg Oral BID AC  . escitalopram  10 mg Oral Daily  . heparin  5,000 Units Subcutaneous 3 times per day  . hydrochlorothiazide  25 mg Oral Daily  . magnesium oxide  800 mg Oral Daily  . oxyCODONE  5 mg Oral QID  . pantoprazole  40 mg Oral BID AC  . pregabalin  50 mg Oral TID  . rOPINIRole  0.5 mg Oral QHS    Continuous Infusions: . dexmedetomidine 0.4 mcg/kg/hr (03/06/14 1248)   [place], [place], [place] Pager [phone] After Hours Pager

## 2014-03-06 NOTE — Progress Notes (Signed)
Rehab admissions - Evaluated for possible admission.  I met with patient and his fiance.  Patient expressed interest in [place] for his rehab.  Fiance prefers for patient to have rehab here in the hospital.  Fiance not happy with the [facility] [place] care.  I gave patient and fiance booklets about inpatient rehab and explained inpatient rehab.  I will follow up again in am to see where patient and fiance want rehab.  Call me for questions.  #[phone]

## 2014-03-06 NOTE — Progress Notes (Signed)
OT Cancellation Note  Patient Details Name: Randy Greene MRN: [id number] DOB: 27-Aug-1965   Cancelled Treatment:    Reason Eval/Treat Not Completed: Other (comment) Nursing weaning off precedex and asked to let pt sleep and see this pm. Will attempt this pm. [place], OTR/L  [phone] 03/06/2014 03/06/2014, 11:37 AM

## 2014-03-06 NOTE — Evaluation (Signed)
Clinical/Bedside Swallow Evaluation Patient Details  Name: Randy Greene MRN: [id number] Date of Birth: [birth year]  Today's Date: 03/06/2014 Time: 0900-0920 SLP Time Calculation (min): 20 min  Past Medical History:  Past Medical History  Diagnosis Date  . MS (multiple sclerosis)   . Hypertension   . Enlarged prostate   . Acid reflux   . Aspiration pneumonia   . Hypercapnic respiratory failure, chronic    Past Surgical History:  Past Surgical History  Procedure Laterality Date  . Hernia repair    . Arthroscopic     HPI:  48 yo male former smoker with hx MS on immunosuppression, presented with severe agitation/combativeness from polypharmacy in setting of MS and recurrent aspiration pneumonia. He required intubation for airway support and sedation from 9/10 to 9/14. MD suspects aspriation more related to polypharmacy than MS.    Assessment / Plan / Recommendation Clinical Impression  Pt demonstrates evidence of adequate airway protection with PO trials, though frequent belching and occasional throat clear indicative of previously diagnosed reflux. There is no sign of an acutely worsened function this admission.   Pt and family both report that prior FEES at [facility] did not recommend any modified diet but did show something that they describe as concern for an esophageal dysphagia. Pts girlfriend reports he refluxes at night and also eats in the middle of the night due to hunger from medications. She feels his swallow is worse when he is heavily medicated. Given this history and recent objective testing recommend pt initiate a regular texture diet with reflux precautions with check of tolerance. SLP will attempt to request prior FEES report to confirm findings. Will not recommend further testing unless pt demonstrates signs concerning for a persistent oropharyngeal dysphagia while fully alert under observation.     Aspiration Risk  Moderate    Diet Recommendation Regular;Thin liquid    Liquid Administration via: Cup;Straw Medication Administration: Whole meds with liquid Supervision: Patient able to self feed Compensations: Slow rate;Small sips/bites;Follow solids with liquid Postural Changes and/or Swallow Maneuvers: Seated upright 90 degrees;Upright 30-60 min after meal    Other  Recommendations Oral Care Recommendations: Oral care BID   Follow Up Recommendations  24 hour supervision/assistance    Frequency and Duration min 2x/week  2 weeks   Pertinent Vitals/Pain NA    SLP Swallow Goals     Swallow Study Prior Functional Status       General HPI: 48 yo male former smoker with hx MS on immunosuppression, presented with severe agitation/combativeness from polypharmacy in setting of MS and recurrent aspiration pneumonia. He required intubation for airway support and sedation from 9/10 to 9/14. MD suspects aspriation more related to polypharmacy than MS.  Type of Study: Bedside swallow evaluation Previous Swallow Assessment: FEES at [facility], records not available, will try to request Diet Prior to this Study: Thin liquids Temperature Spikes Noted: No Respiratory Status: Room air History of Recent Intubation: Yes Length of Intubations (days): 2 days Date extubated: 03/05/14 Behavior/Cognition: Alert;Cooperative;Decreased sustained attention;Impulsive Oral Cavity - Dentition: Adequate natural dentition Self-Feeding Abilities: Able to feed self Patient Positioning: Upright in bed Baseline Vocal Quality: Clear Volitional Cough: Strong Volitional Swallow: Able to elicit    Oral/Motor/Sensory Function Overall Oral Motor/Sensory Function: Appears within functional limits for tasks assessed   Ice Chips     Thin Liquid Thin Liquid: Impaired Presentation: Cup;Straw;Self Fed Pharyngeal  Phase Impairments: Throat Clearing - Delayed;Throat Clearing - Immediate;Other (comments) (belching)    Nectar Thick Nectar Thick Liquid: Not tested  Honey Thick Honey Thick  Liquid: Not tested   Puree Puree: Within functional limits   Solid   GO    Solid: Within functional limits      [facility], MA CCC-SLP [phone]  Lynann Beaver 03/06/2014,10:09 AM

## 2014-03-06 NOTE — Progress Notes (Addendum)
CRITICAL VALUE ALERT  Critical value received:  Potassium 2.8  Date of notification:  03/06/14  Time of notification:  0530  Critical value read back:Yes.    Nurse who received alert:  Thayer Ohm   MD notified (1st page):  Dr. Jimmy Footman   Time of first page:  0535  MD notified (2nd page):  Time of second page:  Responding MD:  Dr. Jimmy Footman  Time MD responded:  [phone]

## 2014-03-06 NOTE — Progress Notes (Signed)
PULMONARY / CRITICAL CARE MEDICINE   Name: Randy Greene MRN: [id number] DOB: [birth year]    ADMISSION DATE:  03/01/2014 CONSULTATION DATE:  03/01/2014  REFERRING MD :  Roxanne Mins, EDP  CHIEF COMPLAINT:  Confusion  INITIAL PRESENTATION:  48 yo male former smoker with hx MS on immunosuppression, presented with severe agitation/combativeness from polypharmacy in setting of MS and recurrent aspiration pneumonia.  He required intubation for airway support and sedation.  STUDIES:  9/10 CT head >> chronic sinus disease, no other acute process 9/10 EEG >> normal sleep EEG 9/10 Echo >> EF 55 to 60%, mild LVH, PAS 32 mmHg 9/11 MR Brain > no acute finding, multifocal white matter lesions consistent with MS, inflammatory change in paranasal sinuses 9/13 LP  revealed very high CSF opening pressure (51 cm H2 0 ) with normal cell count, protein, and glucose   SIGNIFICANT EVENTS: 9/10 Intubation/admission, ?seizure activity/rigors, neurology consulted  SUBJECTIVE:  afebrile Agitated overnight - required precedex gtt & haldol   VITAL SIGNS: Temp:  [98.4 F (36.9 C)-100.2 F (37.9 C)] 98.4 F (36.9 C) (09/15 0354) Pulse Rate:  [64-104] 72 (09/15 0624) Resp:  [13-34] 33 (09/15 0624) BP: (109-183)/(69-107) 181/99 mmHg (09/15 0714) SpO2:  [93 %-98 %] 98 % (09/15 0624) Weight:  [102.2 kg (225 lb 5 oz)] 102.2 kg (225 lb 5 oz) (09/15 0500) VENTILATOR SETTINGS:   INTAKE / OUTPUT:  Intake/Output Summary (Last 24 hours) at 03/06/14 0809 Last data filed at 03/06/14 0600  Gross per 24 hour  Intake 3051.45 ml  Output    500 ml  Net 2551.45 ml    PHYSICAL EXAMINATION: General: int agitated HEENT: NCAT, EOMi PULM: resps even non labored , clearbilaterally CV: RRR, no mgr AB: BS+, soft, nontender OIN:OMVE no edema Neuro: RASS -1to +2 on precedex gtt, follows commands , non focal  LABS:  CBC  Recent Labs Lab 03/03/14 0548 03/04/14 1420 03/06/14 0440  WBC 13.3* 9.4 11.4*  HGB 10.0* 9.5*  9.9*  HCT 30.8* 29.1* 29.1*  PLT 130* 146* 152   BMET  Recent Labs Lab 03/04/14 1420 03/05/14 0435 03/06/14 0440  NA 144 145 141  K 3.5* 3.1* 2.8*  CL 106 106 102  CO2 29 28 26   BUN 23 20 13   CREATININE 0.87 0.85 0.73  GLUCOSE 98 110* 144*   Electrolytes  Recent Labs Lab 03/02/14 0430 03/03/14 0548  03/04/14 1420 03/05/14 0435 03/06/14 0440  CALCIUM 8.4 8.7  < > 8.9 8.6 8.8  MG 1.8 2.3  --   --   --   --   PHOS 3.5  --   --   --   --   --   < > = values in this interval not displayed. Sepsis Markers  Recent Labs Lab 03/01/14 1610 03/01/14 2210 03/02/14 0432  LATICACIDVEN 1.9 1.7 1.6   ABG  Recent Labs Lab 03/01/14 0807 03/06/14 0154  PHART 7.385 7.493*  PCO2ART 45.7* 34.6*  PO2ART 50.0* 57.9*   Liver Enzymes  Recent Labs Lab 03/01/14 0249  AST 26  ALT 28  ALKPHOS 78  BILITOT 0.7  ALBUMIN 4.4   Glucose  Recent Labs Lab 03/04/14 1135 03/04/14 1556 03/04/14 1954 03/04/14 2348 03/05/14 0300 03/05/14 0821  GLUCAP 102* 95 88 97 85 97    Imaging Dg Chest Port 1 View  03/06/2014   CLINICAL DATA:  Chronic respiratory acidosis. Chronic hypercapnic respiratory failure. Follow-up pulmonary edema versus pneumonia.  EXAM: PORTABLE CHEST - 1 VIEW  COMPARISON:  Portable  chest x-rays yesterday dating back to 03/01/2014. Two-view chest x-ray 01/01/2014.  FINDINGS: Cardiac silhouette moderately enlarged. Worsening interstitial and airspace pulmonary edema, asymmetric and increased in the right lung. Perihilar airspace opacities in the right lung are favored to represent pulmonary edema. Left jugular central venous catheter tip projects over the upper SVC.  IMPRESSION: Support apparatus satisfactory. Interval worsening of now moderate to severe interstitial and asymmetric airspace pulmonary edema.   Electronically Signed   By: Evangeline Dakin M.D.   On: 03/06/2014 08:02   Dg Chest Port 1 View  03/05/2014   CLINICAL DATA:  Central line placement.  EXAM:  PORTABLE CHEST - 1 VIEW  COMPARISON:  03/04/2014  FINDINGS: The left IJ central venous catheter is unchanged with tip obliquely oriented over the region of the SVC. The lungs are somewhat hypoinflated with mild prominence of the perihilar markings compatible mild vascular congestion/ edema. No evidence of effusion or pneumothorax. Cardiomediastinal silhouette and remainder of the exam is unchanged.  IMPRESSION: Suggestion mild vascular congestion/edema.  Left IJ central venous catheter with tip obliquely oriented over the region of the SVC.   Electronically Signed   By: Marin Olp M.D.   On: 03/05/2014 18:38     ASSESSMENT / PLAN:  NEUROLOGIC A:   Acute encephalopathy with combative behavior > suspect polypharmacy , doubt encephalitis   MRI brain OK Hx of multiple sclerosis   hsv PCR neg P:   RASS goal:0 Resume outpt xanax,aricept, marinol, neurontin, oxycodone, requip  Continue baclofen to prevent withdrawal Hold  ampyra, tysabri,  ritalin, provigil>> defer to neurology if/when to resume these dc acyclovir  PULMONARY OETT 9/10 >>9/14 A:  Acute respiratory failure 2nd to altered mental status/airway protection and recurrent aspiration pneumonia. Hx of chronic hypercapnic respiratory failure. P:  Feel aspiration related to polypharmacy rather than MS Need swallow eval  CARDIOVASCULAR Lt IJ CVL 9/10 >> A:  Sinus tachycardia from agitation/pneumonia/sepsis >> improved 9/12. Hx of HTN, hyperlipidemia. Acute Pulmonary edema -echo 9/10 nEF P:  Monitor hemodynamics Resume outpt tenormin, HCTZ, cardura Crestor, lipitor in outpt med list >> hold both for now Goal neg fluid balance -dc IVFs, lasix 20 IV x1  RENAL A:  AKI - resolved Hypokalemia. Hx of BPH. P:   F/u and replace electrolytes as needed Monitor renal fx, urine outpt  lasix x1, chk Mag  GASTROINTESTINAL A:   Nutrition. Hx of GERD. P:  Protonix bid (home dose) Swallow eval , advance PO  HEMATOLOGIC A:    Leukocytosis in setting of aspiration pneumonia. P:  F/u CBC SQ heparin for DVT prevention  INFECTIOUS A:   Klebsiella pneumonia with sepsis  Blood 9/10 >>neg Urine 9/10 >>neg Sputum 9/10 >> klebsiella LP 9/13>>> neg P:   Ceftriaxone 9/12>>> empiric acyclovir 9/13>>>9/14   ENDOCRINE A:   No acute issues. P:   Monitor CBG's   TODAY'S SUMMARY:  G neg pneumonia & VDRF from recurrent aspiration, polypharmacy in setting of MS and encephalopathy.  dc acyclovir.  Restarting some home meds, hope to control agitation better  Care during the described time interval was provided by me and/or other providers on the critical care team.  I have reviewed this patient's available data, including medical history, events of note, physical examination and test results as part of my evaluation  CC time x 64m  Kara Mead MD. FCCP. [place] [facility] Pager [phone] If no response call [phone]     03/06/2014  8:09 AM

## 2014-03-06 NOTE — Progress Notes (Signed)
Occupational Therapy Evaluation Patient Details Name: Randy Greene MRN: [id number] DOB: May 17, 1966 Today's Date: 03/06/2014    History of Present Illness 48 yo male former smoker with hx MS on immunosuppression, presented with severe agitation/combativeness from polypharmacy in setting of MS and recurrent aspiration pneumonia. He required intubation for airway support and sedation; extubated 9/14   Clinical Impression   PTA, pt independent with all ADL and mobility. Pt presents with functional decline and will benefit from CIR to return to PLOF and facilitate safe D/C home with family. Pt will benefit from skilled OT services to facilitate D/C to next venue due to below deficits.    Follow Up Recommendations  CIR;Supervision/Assistance - 24 hour    Equipment Recommendations  3 in 1 bedside comode    Recommendations for Other Services Rehab consult     Precautions / Restrictions Precautions Precautions: Fall Restrictions Weight Bearing Restrictions: No      Mobility  Transfers Overall transfer level: Needs assistance Equipment used: Rolling walker (2 wheeled) Transfers: Sit to/from [employer] Transfers Sit to Stand: +2 physical assistance;Min assist Stand pivot transfers: Mod A at times due to LOB to L and poor safety awareness       General transfer comment: transfers affected by attentional deficits. As distracted, pt reuires more assistance for balance and safety to prevent falls.     Balance Overall balance assessment: Needs assistance Sitting-balance support: Feet supported Sitting balance-Leahy Scale: Good     Standing balance support: During functional activity Standing balance-Leahy Scale: Poor Standing balance comment: Pt with L lateral lean during funcitonal taks. pt unaware of L lean while brushing teeth                            ADL Overall ADL's : Needs assistance/impaired     Grooming: Supervision/safety;Set up;Standing   Upper  Body Bathing: Set up;Supervision/ safety;Sitting   Lower Body Bathing: Moderate assistance;Sit to/from stand   Upper Body Dressing : Minimal assistance;Sitting   Lower Body Dressing: Moderate assistance;Sit to/from stand   Toilet Transfer: Moderate assistance;Ambulation     Toileting - Clothing Manipulation Details (indicate cue type and reason): using urinal     Functional mobility during ADLs: Moderate assistance General ADL Comments: impulsive during ADL. postural control affected by attentional deficits     Vision                     Perception     Praxis      Pertinent Vitals/Pain Pain Assessment: Faces Faces Pain Scale: Hurts a little bit Pain Descriptors / Indicators: Sore Pain Intervention(s): Limited activity within patient's tolerance;Monitored during session     Hand Dominance Right   Extremity/Trunk Assessment Upper Extremity Assessment Upper Extremity Assessment: Generalized weakness (intrinsic weakness at baseline?)   Lower Extremity Assessment Lower Extremity Assessment: Generalized weakness   Cervical / Trunk Assessment Cervical / Trunk Assessment: Normal   Communication Communication Communication: No difficulties   Cognition Arousal/Alertness: Awake/alert Behavior During Therapy: Impulsive;Restless Overall Cognitive Status: Impaired/Different from baseline Area of Impairment: Attention;Memory;Following commands;Safety/judgement;Awareness;Problem solving Orientation Level: Person;Place;Time;Situation Current Attention Level: Sustained (easily distracted) Memory: Decreased short-term memory Following Commands: Follows one step commands consistently Safety/Judgement: Decreased awareness of safety;Decreased awareness of deficits (difficulty ambulating and thinks he can get up alone) Awareness: Emergent Problem Solving: Slow processing;Requires verbal cues General Comments: Pt requires cueing for navigating through crowded area with  walker   General Comments  Exercises       Shoulder Instructions      Home Living Family/patient expects to be discharged to:: Private residence Living Arrangements: Spouse/significant other Available Help at Discharge: Family Type of Home: House Home Access: Stairs to enter Technical brewer of Steps: 2 Entrance Stairs-Rails: None Home Layout: One level     Bathroom Shower/Tub: Tub/shower unit Shower/tub characteristics: Architectural technologist: Standard     Home Equipment: Cane - single point          Prior Functioning/Environment Level of Independence: Independent             OT Diagnosis: Generalized weakness;Cognitive deficits;Altered mental status   OT Problem List: Decreased strength;Decreased activity tolerance;Impaired balance (sitting and/or standing);Decreased coordination;Decreased cognition;Decreased safety awareness;Decreased knowledge of use of DME or AE;Decreased knowledge of precautions;Cardiopulmonary status limiting activity;Impaired tone   OT Treatment/Interventions: Self-care/ADL training;Therapeutic exercise;Energy conservation;DME and/or AE instruction;Therapeutic activities;Cognitive remediation/compensation;Patient/family education;Balance training    OT Goals(Current goals can be found in the care plan section) Acute Rehab OT Goals Patient Stated Goal: "to get out of here" OT Goal Formulation: Patient unable to participate in goal setting Time For Goal Achievement: 03/20/14 Potential to Achieve Goals: Good  OT Frequency: Min 2X/week   Barriers to D/C:            Co-evaluation PT/OT/SLP Co-Evaluation/Treatment: Yes Reason for Co-Treatment: Complexity of the patient's impairments (multi-system involvement);Necessary to address cognition/behavior during functional activity;For patient/therapist safety   OT goals addressed during session: ADL's and self-care      End of Session Equipment Utilized During Treatment: Gait  belt;Rolling walker;Oxygen Nurse Communication: Mobility status  Activity Tolerance: Patient tolerated treatment well Patient left: in chair;with call bell/phone within reach;Other (comment) (with PT)   Time: 6286-3817 OT Time Calculation (min): 33 min Charges:  OT General Charges $OT Visit: 1 Procedure OT Evaluation $Initial OT Evaluation Tier I: 1 Procedure OT Treatments $Self Care/Home Management : 8-22 mins G-Codes:    Dedria Endres,HILLARY 03-13-14, 4:49 PM   [facility], OTR/L  [phone] 03-13-14

## 2014-03-06 NOTE — Progress Notes (Signed)
Reviewed assessment and POC. Patient demonstrates significant cognitive deficits impairing safety and functional mobility. Additionally, patient demonstrates poor attention to left LE during mobility, question cognitive verse functional deficits LLE (sensory component questionable.) patient remains significant fall risk at this time. Will continue to see and progress as tolerated.  Alben Deeds, [place] DPT  [phone]

## 2014-03-06 NOTE — Progress Notes (Signed)
PT Cancellation Note  Patient Details Name: Randy Greene MRN: [id number] DOB: December 18, 1965   Cancelled Treatment:    Reason Eval/Treat Not Completed: Fatigue/lethargy limiting ability to participate, nsg weaning precedex asked to let pt sleep and follow up later this pm as able.   Duncan Dull 03/06/2014, 11:40 AM Alben Deeds, PT DPT  [phone]

## 2014-03-06 NOTE — Progress Notes (Signed)
Physical Therapy Treatment Patient Details Name: Randy Greene MRN: [id number] DOB: 01-Oct-1965 Today's Date: 03/06/2014    History of Present Illness 48 yo male former smoker with hx MS on immunosuppression, presented with severe agitation/combativeness from polypharmacy in setting of MS and recurrent aspiration pneumonia. He required intubation for airway support and sedation; extubated 9/14    PT Comments    Pt requires minA-modA (+2 for safety and equipment) with RW for transfers and ambulation. Pt demonstrates reduced safety awareness and impulsiveness. Pt making progress towards goals and is expected to continue to progress with continued skilled PT. Pt would benefit from CIR to further pt's independence with functional mobility.    Follow Up Recommendations  CIR;Supervision/Assistance - 24 hour     Equipment Recommendations  Other (comment) (TBD)    Recommendations for Other Services Rehab consult     Precautions / Restrictions Precautions Precautions: Fall Restrictions Weight Bearing Restrictions: No    Mobility  Bed Mobility Overal bed mobility: Needs Assistance Bed Mobility: Sit to Sidelying;Rolling Rolling: Min guard (For lines management)   Supine to sit: Min guard     General bed mobility comments: Assist for safety and line managment.  Transfers Overall transfer level: Needs assistance Equipment used: Rolling walker (2 wheeled) Transfers: Sit to/from [employer] Sit to Stand: +2 safety/equipment;Min assist Stand pivot transfers: Min Assist;+2 physical assistance (without RW)       General transfer comment: Assist for unsteadiness and safety 2/2 impulsiveness. VC provided for technique.  Ambulation/Gait Ambulation/Gait assistance: Mod assist;+2 safety/equipment Ambulation Distance (Feet): 60 Feet Assistive device: Rolling walker (2 wheeled) Gait Pattern/deviations: Step-through pattern;Drifts right/left   Gait velocity interpretation:  at or above normal speed for age/gender General Gait Details: Pt demonstrates reduced safety awareness with ambulation, requires cueing to slow down and for walker placement. VC provided for technique with walker. Pt requires Mod A during gait for unsteadiness and tendency to drift to left.   Stairs            Wheelchair Mobility    Modified Rankin (Stroke Patients Only)       Balance Overall balance assessment: Needs assistance Sitting-balance support: Feet supported Sitting balance-Leahy Scale: Good     Standing balance support: During functional activity Standing balance-Leahy Scale: Fair Standing balance comment: Pt able to stand without support, but requires Mod A during functional activity for L lean. Pt has tendency to nonweightbear on L LE, requires cueing to stand evenly on both LE during activity. With initial standing, pt demonstrates mild unsteadiness.                    Cognition Arousal/Alertness: Awake/alert Behavior During Therapy: Impulsive;Restless Overall Cognitive Status: Impaired/Different from baseline Area of Impairment: Safety/judgement;Awareness;Attention Orientation Level: Person;Place;Time;Situation Current Attention Level: Selective   Following Commands: Follows one step commands consistently Safety/Judgement: Decreased awareness of safety;Decreased awareness of deficits Awareness: Intellectual Problem Solving: Requires verbal cues General Comments: Pt requires cueing for navigating through crowded area with walker    Exercises      General Comments General comments (skin integrity, edema, etc.): Pt instructed on safety with in room mobility. Discussed d/c plan.       Pertinent Vitals/Pain      Home Living                      Prior Function            PT Goals (current goals can now be found in the care  plan section) Acute Rehab PT Goals Patient Stated Goal: "to get out of here" PT Goal Formulation: With  patient Time For Goal Achievement: 03/19/14 Potential to Achieve Goals: Good    Frequency  Min 3X/week    PT Plan Current plan remains appropriate    Co-evaluation             End of Session Equipment Utilized During Treatment: Gait belt Activity Tolerance: Patient tolerated treatment well Patient left: in bed;with call bell/phone within reach;with bed alarm set;with restraints reapplied     Time: 1455-1539 PT Time Calculation (min): 44 min  Charges:                       G Codes:      Randy Greene 03/06/2014, 4:19 PM Levonne Hubert, SPT

## 2014-03-07 LAB — BASIC METABOLIC PANEL
Anion gap: 11 (ref 5–15)
BUN: 11 mg/dL (ref 6–23)
CHLORIDE: 98 meq/L (ref 96–112)
CO2: 29 mEq/L (ref 19–32)
Calcium: 8.9 mg/dL (ref 8.4–10.5)
Creatinine, Ser: 0.79 mg/dL (ref 0.50–1.35)
GFR calc Af Amer: >90 mL/min (ref >90)
GFR calc non Af Amer: >90 mL/min (ref >90)
Glucose, Bld: 105 mg/dL — ABNORMAL HIGH (ref 70–99)
POTASSIUM: 2.8 meq/L — AB (ref 3.7–5.3)
Sodium: 138 mEq/L (ref 137–147)

## 2014-03-07 LAB — CULTURE, BLOOD (ROUTINE X 2)
Culture: NO GROWTH
Culture: NO GROWTH

## 2014-03-07 LAB — CBC
HCT: 30.8 % — ABNORMAL LOW (ref 39.0–52.0)
HEMOGLOBIN: 10.5 g/dL — AB (ref 13.0–17.0)
MCH: 31.4 pg (ref 26.0–34.0)
MCHC: 34.1 g/dL (ref 30.0–36.0)
MCV: 92.2 fL (ref 78.0–100.0)
Platelets: 220 10*3/uL (ref 150–400)
RBC: 3.34 MIL/uL — AB (ref 4.22–5.81)
RDW: 13.9 % (ref 11.5–15.5)
WBC: 10.8 10*3/uL — ABNORMAL HIGH (ref 4.0–10.5)

## 2014-03-07 MED ORDER — ACETAMINOPHEN 160 MG/5ML PO SOLN: 650.0000 mg | Freq: Four times a day (QID) | ORAL | Status: DC | PRN

## 2014-03-07 MED ORDER — POTASSIUM CHLORIDE CRYS ER 20 MEQ PO TBCR
40.0000 meq | EXTENDED_RELEASE_TABLET | Freq: Once | ORAL | Status: AC
  Administered 2014-03-07: 40 meq via ORAL
  Filled 2014-03-07: qty 2

## 2014-03-07 MED ORDER — LOPERAMIDE HCL 1 MG/5ML PO LIQD
2.0000 mg | ORAL | Status: DC | PRN
  Administered 2014-03-07: 2 mg via ORAL
  Filled 2014-03-07: qty 10

## 2014-03-07 MED ORDER — POTASSIUM CHLORIDE 10 MEQ/50ML IV SOLN
10.0000 meq | INTRAVENOUS | Status: DC
  Administered 2014-03-07: 10 meq via INTRAVENOUS
  Filled 2014-03-07 (×2): qty 50

## 2014-03-07 MED ORDER — CIPROFLOXACIN HCL 500 MG PO TABS: 500.0000 mg | ORAL_TABLET | Freq: Two times a day (BID) | ORAL | Status: DC

## 2014-03-07 MED ORDER — POTASSIUM CHLORIDE 10 MEQ/50ML IV SOLN
10.0000 meq | INTRAVENOUS | Status: AC
  Administered 2014-03-07 (×4): 10 meq via INTRAVENOUS
  Filled 2014-03-07 (×6): qty 50

## 2014-03-07 NOTE — Progress Notes (Addendum)
Much improved Able to ambulate, alert, interactive Med rec reviewed in detail -dc oxycodone & xanax from [facility], ct naltreoxone on discharge -ct ceftx if he goes to inpt rehab vs PO cipro if discharged home -Needs CXR FU in 5-7 days, diuresed well with lasix, K being replaced Await CIR input, can transfer to floor or discharge to CIR  Rigoberto Noel. MD

## 2014-03-07 NOTE — Progress Notes (Signed)
Subjective: Patient has been extubated successfully. Mental status has markedly improved with no recurrence of agitation. He had no complaints at the time of this evaluation, including no headache no blurred vision. Acyclovir was discontinued.  Objective: Current vital signs: BP 122/95  Pulse 68  Temp(Src) 98.4 F (36.9 C) (Oral)  Resp 16  Ht 6\' 3"  (1.905 m)  Wt 100.8 kg (222 lb 3.6 oz)  BMI 27.78 kg/m2  SpO2 97%  Neurologic Exam: Alert and in no acute distress. Patient is well-oriented to time as well as place. Mental status was normal. Extraocular movements were full and conjugate. No facial weakness was noted. Speech was normal. Patient moved extremities equally.  Medications: I have reviewed the patient's current medications.  Assessment/Plan: Altered mental status with acute delirium, resolved. Etiology remains unclear, but possibly related to medications. EEG and MRI were unremarkable. CSF was also unremarkable. Significance of elevated CSF pressure is unclear, but likely is due to agitated state.  Recommended changes in current management. Physical therapy input to continue. No objection at this point to resuming routine MS treatment with Tysabri, as scheduled.  We will continue to follow this patient with you for now.  C.R. Nicole Kindred, MD [facility] [phone]  03/07/2014  9:40 AM

## 2014-03-07 NOTE — Progress Notes (Signed)
Physical Therapy Treatment Patient Details Name: Randy Greene MRN: [id number] DOB: 1966/03/21 Today's Date: 03/07/2014    History of Present Illness 48 yo male former smoker with hx MS on immunosuppression, presented with severe agitation/combativeness from polypharmacy in setting of MS and recurrent aspiration pneumonia. He required intubation for airway support and sedation; extubated 9/14    PT Comments    Patient seen to reassess for disposition needs ZS:[id number] improvement. Patient demonstrates improved physical function. Able to ambulate with and without assistive device with supervision and MAX cues for safety awareness. At this time, patient remains high fall risk secondary to impulsivity, but demonstrates no overall physical deficits.  Spoke with patient and gf at length regarding mobility concerns and impulsivity. Patient educated on need for supervision initially upon discharge.  At this time, do not feel that mobility is limited by physical ability, anticipate that as cognition continues to clear patient will continue to improve. At this time, recommending supervision 24/7 and patient educated on controlled mobility.  Follow Up Recommendations  No PT follow up;Supervision/Assistance - 24 hour     Equipment Recommendations   (recommend use of his SPC at home)    Recommendations for Other Services       Precautions / Restrictions Precautions Precautions: Fall Restrictions Weight Bearing Restrictions: No    Mobility  Bed Mobility               General bed mobility comments: not assessed received up in chair  Transfers Overall transfer level: Needs assistance Equipment used: Rolling walker (2 wheeled);None Transfers: Sit to/from Stand Sit to Stand: Supervision Stand pivot transfers: Supervision          Ambulation/Gait Ambulation/Gait assistance: Supervision Ambulation Distance (Feet): 210 Feet Assistive device: Rolling walker (2 wheeled);None (40 ft  without assistive device (close supervision)) Gait Pattern/deviations: Step-through pattern;Decreased dorsiflexion - left;Drifts right/left Gait velocity: impulsively fast (cues to control and slow gait) Gait velocity interpretation: at or above normal speed for age/gender General Gait Details: Patient still with some concerns for safety with mobility related to impulsivitt   Stairs            Wheelchair Mobility    Modified Rankin (Stroke Patients Only)       Balance     Sitting balance-Leahy Scale: Good       Standing balance-Leahy Scale: Good                      Cognition Arousal/Alertness: Awake/alert Behavior During Therapy: Impulsive;Restless             Safety/Judgement: Decreased awareness of safety          Exercises      General Comments General comments (skin integrity, edema, etc.): spoke at length with patient and girlfriend regarding mobility, safety awareness, impulsivity, medication management, and supervision needs. patient educated on importance of slowed controlled movement to limit risk of falls.      Pertinent Vitals/Pain Pain Assessment: No/denies pain    Home Living                      Prior Function            PT Goals (current goals can now be found in the care plan section) Acute Rehab PT Goals PT Goal Formulation: With patient Time For Goal Achievement: 03/19/14 Potential to Achieve Goals: Good Progress towards PT goals: Progressing toward goals    Frequency  Min 3X/week    PT  Plan Discharge plan needs to be updated    Co-evaluation             End of Session Equipment Utilized During Treatment: Gait belt Activity Tolerance: Patient tolerated treatment well Patient left: in chair;with call bell/phone within reach;with family/visitor present     Time: 5638-9373 PT Time Calculation (min): 24 min  Charges:  $Gait Training: 8-22 mins $Self Care/Home Management: 8-22                     G CodesDuncan Dull 03-17-2014, 10:30 AM Alben Deeds, PT DPT  [phone]

## 2014-03-07 NOTE — Discharge Instructions (Signed)
1. Review your medications carefully as they have changed 2. Follow up with Pulmonary office as scheduled for a chest xray review 3. Make an appointment to be seen with the [facility] for hospital follow up 4. Stop oxycodone & xanax

## 2014-03-07 NOTE — Plan of Care (Signed)
Problem: Phase I Progression Outcomes Goal: Initial discharge plan identified Outcome: Completed/Met Date Met:  03/07/14 D/C to home 03/07/14

## 2014-03-07 NOTE — Progress Notes (Signed)
CRITICAL VALUE ALERT  Critical value received: K+ 2.8  Date of notification:  09/16   Time of notification: 0350 Critical value read back:Yes.    Nurse who received alert:  Cedric Fishman MD notified (1st page):  [place] called Time of first page:  [phone] MD notified (2nd page):  Time of second page:  Responding MD:  [facility] Time MD responded: [phone]

## 2014-03-07 NOTE — Progress Notes (Signed)
SLP Cancellation Note  Patient Details Name: Erick Murin MRN: [id number] DOB: [birth year]   Cancelled treatment:       Reason Eval/Treat Not Completed: Other (comment). Pt to d/c hospital soon. Had planned to request FEES from [facility] but pt likely to d/c prior. Asked family to get reports and provide then to pulmonologist to f/u, which they plan to do. As aspiration is primarily reported to be reflux and medication related by objective testing, recommend f/u with physician, no further acute SLP needs.   Herbie Baltimore, [place] CCC-SLP [phone]   Lynann Beaver 03/07/2014, 11:06 AM

## 2014-03-07 NOTE — Discharge Summary (Signed)
Physician Discharge Summary  Patient ID: Ladarrell Cornwall MRN: [id number] DOB/AGE: 12-21-1965 48 y.o.  Admit date: 03/01/2014 Discharge date: 03/07/2014    Discharge Diagnoses:  Active Problems:   Acute encephalopathy   MS (multiple sclerosis)   Chronic hypercapnic respiratory failure   Recurrent aspiration bronchitis/pneumonia   Agitation                                                                    D/c Plan by Discharge Diagnosis   Acute encephalopathy with combative behavior > suspect polypharmacy , doubt encephalitis.  MRI< EEG, CT head neg. HSV PCR neg.  Hx of multiple sclerosis  D/c plan --  Resume baclofen, ampyra, marinol, requip, aricept, lexapro, ritalin, Provigil, naltrexone, lyrica, tysabri DO NOT resume oxycodone, xanax at d/c  outpt neuro f/u at [facility] as previous    Acute respiratory failure 2nd to altered mental status/airway protection and recurrent aspiration pneumonia - Feel aspiration related to polypharmacy rather than MS .  Hx of chronic hypercapnic respiratory failure.  Klebsiella PNA Hx OSA  D/c plan --  Cipro PO x 10 days total abx  outpt f/u pulmonary with CXR  pulm hygiene  Cont home CPAP Mobilize as able  Minimize sedating medications as above  PRN BD     Hx of HTN, hyperlipidemia.  Acute Pulmonary edema -echo 9/10 nEF  P:  [facility] Resume outpt tenormin, HCTZ, cardura, lipitor    AKI - resolved  Hypokalemia.  Hx of BPH.  P:  Monitor chem as outpt doxazosin     Hx of GERD.  P:  Protonix bid (home dose)     Brief Summary: Anothony Greene is a 48 y.o. y/o male with a PMH of MS on Tysabri who presented 9/10 with several days of confusion progressing to severe agitation.  Initial impression was that this was r/t polypharmacy in setting of MS and recurrent aspiration PNA.  His agitation escalated quickly to significant combativeness despite haldol, ativan, geodon and he eventually required intubation and ongoing heavy sedation.   Agitation remained an issue even on IV sedation.  He was seen in consultation by neurology, CT head, MRI brain and EEG were all essentially negative for any acute findings.  He was treated with IV ceftriaxone for what was found to be klebsiella PNA. Given continued agitation he ultimately underwent LP which revealed significantly elevated opening pressure of unclear etiology but CSF analysis was unremarkable.  He was treated with empiric acyclovir for ?HSV encephalitis, which was d/c'd when HSV PCR (CSF) returned neg.  Course also c/b acute pulmonary edema, grade 1 diastolic dysfunction on 2D echo, resolved with diuresis. Agitation and acute encephalopathy improved slowly, again lending to probable polypharmacy, and he was extubated 9/14 with good respiratory status.  Speech therapist evaluated and felt he was suitable for PO diet which he has tolerated.  He was seen by physical therapy and CIR and strength improved quickly.  He is now back to baseline respiratory and functional status given underlying MS  and is ready for d/c home pending close outpt f/u.    STUDIES:  9/10 CT head >> chronic sinus disease, no other acute process  9/10 EEG >> normal sleep EEG  9/10 Echo >> EF 55 to  60%, mild LVH, PAS 32 mmHg  9/11 MR Brain > no acute finding, multifocal white matter lesions consistent with MS, inflammatory change in paranasal sinuses  9/13 LP revealed very high CSF opening pressure (51 cm H2 0 ) with normal cell count, protein, and glucose   SIGNIFICANT EVENTS:  9/10 Intubation/admission, ?seizure activity/rigors, neurology consulted  Tubes/Lines:  ETT 9/10>>>9/14 L IJ CVL 9/10>>>9/16  Abx:  Ceftriaxone 9/12>>>  empiric acyclovir 9/13>>>9/14  Cultures:  Blood 9/10 >>neg  Urine 9/10 >>neg  Sputum 9/10 >> abundant klebsiella >>RES ampicillin, otherwise pan sens LP 9/13>>> neg      Filed Vitals:   03/07/14 0700 03/07/14 0800 03/07/14 0900 03/07/14 1057  BP: 122/95  135/76 135/76  Pulse:  68  73 67  Temp:  98.4 F (36.9 C)    TempSrc:  Oral    Resp: 16  22   Height:      Weight:      SpO2: 97%  98%      Discharge Labs  BMET  Recent Labs Lab 03/01/14 0249  03/02/14 0430 03/03/14 0548  03/04/14 1420 03/05/14 0435 03/06/14 0440 03/06/14 1700 03/07/14 0212  NA 144  --  141 143  < > 144 145 141 139 138  K 3.6*  --  3.3* 3.4*  < > 3.5* 3.1* 2.8* 3.1* 2.8*  CL 100  --  101 104  < > 106 106 102 98 98  CO2 22  --  29 30  < > 29 28 26 27 29   GLUCOSE 155*  --  106* 101*  < > 98 110* 144* 127* 105*  BUN 9  --  20 27*  < > 23 20 13 11 11   CREATININE 0.85  < > 1.32 1.10  < > 0.87 0.85 0.73 0.72 0.79  CALCIUM 10.0  --  8.4 8.7  < > 8.9 8.6 8.8 8.8 8.9  MG  --   --  1.8 2.3  --   --   --  1.5  --   --   PHOS  --   --  3.5  --   --   --   --   --   --   --   < > = values in this interval not displayed.   CBC   Recent Labs Lab 03/04/14 1420 03/06/14 0440 03/07/14 0212  HGB 9.5* 9.9* 10.5*  HCT 29.1* 29.1* 30.8*  WBC 9.4 11.4* 10.8*  PLT 146* 152 220   Anti-Coagulation  Recent Labs Lab 03/04/14 1420  INR 1.15      Discharge Instructions   Activity as tolerated - No restrictions    Complete by:  As directed      Call MD for:  difficulty breathing, headache or visual disturbances    Complete by:  As directed      Call MD for:  extreme fatigue    Complete by:  As directed      Call MD for:  hives    Complete by:  As directed      Call MD for:  persistant dizziness or light-headedness    Complete by:  As directed      Call MD for:  persistant nausea and vomiting    Complete by:  As directed      Call MD for:  severe uncontrolled pain    Complete by:  As directed      Call MD for:  temperature >100.4    Complete by:  As directed      Diet - low sodium heart healthy    Complete by:  As directed      Discharge instructions    Complete by:  As directed   1. Review your medications carefully as they have changed.   2. Stop taking oxycodone &  xanax 3. Follow up with your PCP as scheduled.                Follow-up Information   Schedule an appointment as soon as possible for a visit with [facility].   Specialty:  General Practice   Contact information:   [address] [place] [address] [phone]       Follow up with PARRETT,TAMMY, NP On 03/15/2014. (Appt at 10:15. Arrive at 9:50 for Chest XRay)    Specialty:  Nurse Practitioner   Contact information:   [place] [place] [address] [phone]          Medication List    STOP taking these medications       ALPRAZolam 1 MG tablet  Commonly known as:  XANAX     naproxen 500 MG tablet  Commonly known as:  NAPROSYN     oxycodone 5 MG capsule  Commonly known as:  OXY-IR      TAKE these medications       acetaminophen 160 MG/5ML solution  Commonly known as:  TYLENOL  Take 20.3 mLs (650 mg total) by mouth every 6 (six) hours as needed for mild pain or fever.     albuterol (2.5 MG/3ML) 0.083% nebulizer solution  Commonly known as:  PROVENTIL  Take 2.5 mg by nebulization 4 (four) times daily as needed for wheezing.     albuterol 108 (90 BASE) MCG/ACT inhaler  Commonly known as:  PROVENTIL HFA;VENTOLIN HFA  Inhale 1 puff into the lungs 4 (four) times daily as needed for shortness of breath (cough).     AMPYRA 10 MG Tb12  Generic drug:  dalfampridine  Take 10 mg by mouth every 12 (twelve) hours.     atenolol 50 MG tablet  Commonly known as:  TENORMIN  Take 50 mg by mouth daily.     atorvastatin 40 MG tablet  Commonly known as:  LIPITOR  Take 20 mg by mouth at bedtime.     B COMPLEX VITAMINS SL  Place 1 each under the tongue daily. 1-2 dropperfuls every morning     baclofen 10 MG tablet  Commonly known as:  LIORESAL  Take 5-10 mg by mouth 3 (three) times daily. Take 1/2 tablet (37m) by mouth twice daily and take 1 tablet (177m at bedtime     Calcium Carbonate 500 MG Chew  Chew 3 tablets by mouth daily. To  reduce cramps     cetirizine 10 MG tablet  Commonly known as:  ZYRTEC  Take 10 mg by mouth daily.     ciprofloxacin 500 MG tablet  Commonly known as:  CIPRO  Take 1 tablet (500 mg total) by mouth 2 (two) times daily.  Start taking on:  03/08/2014     diclofenac sodium 1 % Gel  Commonly known as:  VOLTAREN  Apply 1 application topically 2 (two) times daily. Apply small amount to affected area twice daily for pain     donepezil 10 MG tablet  Commonly known as:  ARICEPT  Take 10 mg by mouth daily.     doxazosin 4 MG tablet  Commonly known as:  CARDURA  Take 2 mg by mouth at bedtime. Do not take the same day with viagra, can cause heart attack by dropping blood pressure     dronabinol 2.5 MG capsule  Commonly known as:  MARINOL  Take 5-7.5 mg by mouth 3 (three) times daily. Take 3 capsules (7.5 mg) every morning and every evening, take 2 capsules (5 mg) daily at noon     escitalopram 10 MG tablet  Commonly known as:  LEXAPRO  Take 10 mg by mouth daily. For mental health     ferrous sulfate 325 (65 FE) MG tablet  Take 325 mg by mouth 2 (two) times daily with a meal.     hydrochlorothiazide 25 MG tablet  Commonly known as:  HYDRODIURIL  Take 25 mg by mouth daily. For fluid and blood pressure     imiquimod 5 % cream  Commonly known as:  ALDARA  Apply 1 application topically 3 (three) times a week. Apply thin layer to affected area     lactobacillus acidophilus Tabs tablet  Take 2 tablets by mouth 3 (three) times daily.     lidocaine 5 %  Commonly known as:  LIDODERM  Place 1 patch onto the skin daily as needed (for pain). Remove & Discard patch within 12 hours or as directed by MD     Magnesium Oxide 420 MG Tabs  Take 840 mg by mouth daily. To reduce muscle cramps     methylphenidate 10 MG tablet  Commonly known as:  RITALIN  Take 10 mg by mouth 2 (two) times daily with breakfast and lunch.     metoCLOPramide 10 MG tablet  Commonly known as:  REGLAN  Take 10 mg by  mouth at bedtime.     modafinil 200 MG tablet  Commonly known as:  PROVIGIL  Take 200 mg by mouth 2 (two) times daily. Take one tablet in the morning and at noon. Take with ritalin     naltrexone 50 MG tablet  Commonly known as:  DEPADE  Take 5 mg by mouth daily. [facility] cut tablet in half (25 mg) and then cut the half tablets in 5 pieces (5 mg)     ondansetron 8 MG tablet  Commonly known as:  ZOFRAN  Take 8 mg by mouth every 8 (eight) hours as needed for nausea.     OVER THE COUNTER MEDICATION  Take 1 each by mouth daily. Liquid Vitamin D3 or D2 - approx 30 mls daily     pantoprazole 40 MG tablet  Commonly known as:  PROTONIX  Take 40 mg by mouth 2 (two) times daily before a meal.     potassium chloride 10 MEQ tablet  Commonly known as:  K-DUR,KLOR-CON  Take 10 mEq by mouth daily as needed (low potassium).     pregabalin 50 MG capsule  Commonly known as:  LYRICA  Take 50 mg by mouth 3 (three) times daily.     ranitidine 150 MG tablet  Commonly known as:  ZANTAC  Take 150 mg by mouth 2 (two) times daily.     rOPINIRole 0.5 MG tablet  Commonly known as:  REQUIP  Take 0.5 mg by mouth at bedtime. Take 1-2 tablets (0.5 mg) 1-2 hours before bedtime for restless legs     tadalafil 20 MG tablet  Commonly known as:  CIALIS  Take 20 mg by mouth daily as needed for erectile dysfunction. Take 1 hour before sexual activity - do not exceed 1 dose in 24  hour period. Do not take same day with doxazosin.     TYSABRI IV  Inject into the vein every 21 ( twenty-one) days.          Disposition: 01-Home or Self Care  Discharged Condition: Randy Greene has met maximum benefit of inpatient care and is medically stable and cleared for discharge.  Patient is pending follow up as above.      Time spent on disposition:  Greater than 35 minutes.   SignedDarlina Sicilian, NP 03/07/2014  11:47 AM Pager: [phone] or [phone]   Physician Statement:   The Patient was  personally examined, the discharge assessment and plan has been personally reviewed and I agree with ACNP  assessment and plan. > 30 minutes of time have been dedicated to discharge assessment, planning and discharge instructions.   Rigoberto Noel MD

## 2014-03-07 NOTE — Progress Notes (Signed)
Occupational Therapy Treatment Patient Details Name: Patsy Varma MRN: [id number] DOB: 12-Nov-1965 Today's Date: 03/07/2014    History of present illness 48 yo male former smoker with hx MS on immunosuppression, presented with severe agitation/combativeness from polypharmacy in setting of MS and recurrent aspiration pneumonia. He required intubation for airway support and sedation; extubated 9/14   OT comments  Pt doing much better this am. Pt apparently closer to baseline cognition and balance/mobility has also improved. Educated pt/fiance on home safety and recommendations below. Rec initial 24/7 S.   Follow Up Recommendations  Supervision/Assistance - 24 hour    Equipment Recommendations  None recommended by OT    Recommendations for Other Services      Precautions / Restrictions Precautions Precautions: Fall Restrictions Weight Bearing Restrictions: No       Mobility Bed Mobility  mod I             General bed mobility comments: not assessed received up in chair  Transfers Overall transfer level: Needs assistance Transfers: Sit to/from Stand Sit to Stand: Supervision Stand pivot transfers: Supervision            Balance     Sitting balance-Leahy Scale: Good       Standing balance-Leahy Scale: Good                     ADL                                         General ADL Comments: Discussed home safety with pt/fiance. They verbalized understanding for need to use tub bench for transfers, S for showers and need to use grab bar in shower for safety and importance of having 24/7 S initially after D/C.       Vision                     Perception     Praxis      Cognition   Behavior During Therapy: Impulsive Overall Cognitive Status: Impaired/Different from baseline            Safety/Judgement: Decreased awareness of safety     General Comments: alert and oriented. Impulsive. Fiance states he is closer  to baseline today.     Extremity/Trunk Assessment               Exercises     Shoulder Instructions       General Comments      Pertinent Vitals/ Pain       Pain Assessment: No/denies pain  Home Living                                          Prior Functioning/Environment              Frequency       Progress Toward Goals  OT Goals(current goals can now be found in the care plan section)  Progress towards OT goals: Progressing toward goals  Acute Rehab OT Goals Patient Stated Goal: "to get out of here" OT Goal Formulation: With patient Time For Goal Achievement: 03/20/14 Potential to Achieve Goals: Good ADL Goals Pt Will Perform Lower Body Bathing: with supervision;sit to/from stand Pt Will Perform Lower Body Dressing: with supervision;sit to/from stand Pt Will Transfer  to Toilet: with supervision;ambulating;regular height toilet Pt Will Perform Toileting - Clothing Manipulation and hygiene: with modified independence;sit to/from stand Additional ADL Goal #1: demonstrte anticipatory awareness during ADL task in min distracting environment  Plan Discharge plan needs to be updated    Co-evaluation                 End of Session     Activity Tolerance Patient tolerated treatment well   Patient Left in chair;with call bell/phone within reach;with family/visitor present   Nurse Communication Mobility status        Time: 6962-9528 OT Time Calculation (min): 15 min  Charges: OT General Charges $OT Visit: 1 Procedure OT Treatments $Self Care/Home Management : 8-22 mins  Shella Lahman,H[place]ARY 03/07/2014, 11:25 AM   Maurie Boettcher, OTR/L  [phone] 03/07/2014

## 2014-03-07 NOTE — Progress Notes (Signed)
Rehab admissions - Patient doing very well this am.  Able to ambulate 210 ft with supervision only.  Fiance and patient are anxious for discharge home once medically ready.  No follow up recommended by PT and will not need an acute inpatient rehab admission.  Agree with discharge home when medically ready.  Call me for questions.  #[phone]

## 2014-03-08 LAB — CSF CULTURE

## 2014-03-08 LAB — CSF CULTURE W GRAM STAIN
Culture: NO GROWTH
Gram Stain: NONE SEEN

## 2014-03-09 LAB — WEST NILE AB, IGG AND IGM, CSF: West Nile Ab, IgG, CSF: <1.3 index (ref <1.30)

## 2014-03-15 ENCOUNTER — Ambulatory Visit (INDEPENDENT_AMBULATORY_CARE_PROVIDER_SITE_OTHER): Payer: Medicare Other | Admitting: Adult Health

## 2014-03-15 ENCOUNTER — Ambulatory Visit (INDEPENDENT_AMBULATORY_CARE_PROVIDER_SITE_OTHER)
Admission: RE | Admit: 2014-03-15 | Discharge: 2014-03-15 | Disposition: A | Discharge status: Home / Self Care | Payer: Medicare Other | Source: Ambulatory Visit | Attending: Adult Health | Admitting: Adult Health

## 2014-03-15 ENCOUNTER — Encounter: Payer: Self-pay | Admitting: Adult Health

## 2014-03-15 VITALS — BP 140/98 | HR 65 | Temp 97.7°F | Ht 74.0 in | Wt 216.2 lb

## 2014-03-15 DIAGNOSIS — IMO0002 Reserved for concepts with insufficient information to code with codable children: Secondary | ICD-10-CM

## 2014-03-15 DIAGNOSIS — G9340 Encephalopathy, unspecified: Secondary | ICD-10-CM

## 2014-03-15 DIAGNOSIS — J690 Pneumonitis due to inhalation of food and vomit: Secondary | ICD-10-CM

## 2014-03-15 DIAGNOSIS — J9610 Chronic respiratory failure, unspecified whether with hypoxia or hypercapnia: Secondary | ICD-10-CM

## 2014-03-15 DIAGNOSIS — J9612 Chronic respiratory failure with hypercapnia: Secondary | ICD-10-CM

## 2014-03-15 DIAGNOSIS — Z9989 Dependence on other enabling machines and devices: Secondary | ICD-10-CM

## 2014-03-15 DIAGNOSIS — G4733 Obstructive sleep apnea (adult) (pediatric): Secondary | ICD-10-CM

## 2014-03-15 NOTE — Progress Notes (Signed)
Reviewed & agree with plan  

## 2014-03-15 NOTE — Assessment & Plan Note (Signed)
Restart CPAP At bedtime   Follow up with sleep MD as planned

## 2014-03-15 NOTE — Patient Instructions (Signed)
Follow up at [facility] with pulmonary and sleep specialist as planned .  Restart CPAP as soon as possible.  On return please bring copies of your pulmonary function test and sleep study if possible.  Follow up Dr. Elsworth Soho  Or Dr. Halford Chessman  In 3 months and As needed

## 2014-03-15 NOTE — Assessment & Plan Note (Signed)
Avoid oversedation  CPAP .At bedtime   follow up with pulmonary at [facility] for PFT as planned  follow up in 3 months with Dr. Elsworth Soho  Or Dr. Halford Chessman  For local pulmonary Rachelle Hora MD (bring copy of test if possible )

## 2014-03-15 NOTE — Assessment & Plan Note (Signed)
Resolved  Avoid oversedation

## 2014-03-15 NOTE — Progress Notes (Signed)
Subjective:    Patient ID: Randy Greene, male    DOB: 03-26-1966, 48 y.o.   MRN: [id number]  HPI 48 year old male with multiple sclerosis seen during 2 hospitalization by  pulmonary critical care team 01/01/14 and  03/01/2014 with acute hypercarbic respiratory failure, acute encephalopathy  And PNA with combative behavior    03/15/2014 [facility] follow up  Pt returns for a post hospital follow up . Has been admitted x 2 over last 2 months for hyprecarbic resp failure w/ acute encephalopathy. He has a hx of MS on Tysabri along w/ multiple medications followed mainly at [place] [facility] in [place]. Pt had extreme agitation that required sedation and ultimately intubation.  He was seen in consultation by neurology, CT head, MRI brain and EEG were all essentially negative for any acute findings. He was treated with IV ceftriaxone for what was found to be klebsiella PNA. Given continued agitation he ultimately underwent LP which revealed significantly elevated opening pressure of unclear etiology but CSF analysis was unremarkable. He was treated with empiric acyclovir for ?HSV encephalitis, which was d/c'd when HSV PCR (CSF) returned neg. Course also c/b acute pulmonary edema, grade 1 diastolic dysfunction on 2D echo, resolved with diuresis. Agitation and acute encephalopathy improved slowly, again lending to probable polypharmacy, and he was extubated 9/14 with good respiratory status. Speech therapist evaluated and felt he was suitable for PO diet which he has tolerated. He was seen by physical therapy and CIR and strength improved quickly. Discharged on Cipro x 10 d .  Since discharge he is feeling much better. Reports breathing is doing well since discharge, no new complaints.  CXR today shows cleared infiltrates.  Has hx of OSA on CPAP . Has not been wearing CPAP for few months . WE discussed importance of compliance and he is going to see sleep specialist at [facility] [address] this week.  Also has been set up with  pulmonary specialist for PFT at [facility] . Hx of smoking .  Was advised to avoid oversedation . Stopped oxycodone and xanax at discharge.      Review of Systems Constitutional:   No  weight loss, night sweats,  Fevers, chills, + fatigue, or  lassitude.  HEENT:   No headaches,  Difficulty swallowing,  Tooth/dental problems, or  Sore throat,                No sneezing, itching, ear ache, nasal congestion, post nasal drip,   CV:  No chest pain,  Orthopnea, PND, swelling in lower extremities, anasarca, dizziness, palpitations, syncope.   GI  No heartburn, indigestion, abdominal pain, nausea, vomiting, diarrhea, change in bowel habits, loss of appetite, bloody stools.   Resp:    No non-productive cough,  No coughing up of blood.  No change in color of mucus.  No wheezing.  No chest wall deformity  Skin: no rash or lesions.  GU: no dysuria, change in color of urine, no urgency or frequency.  No flank pain, no hematuria   MS:  No joint pain or swelling.  No decreased range of motion.  No back pain.  Psych:  No change in mood or affect. No depression or anxiety.  No memory loss.         Objective:   Physical Exam GEN: A/Ox3; pleasant , NAD, well nourished , walks with cane   HEENT:  [place]/AT,  EACs-clear, TMs-wnl, NOSE-clear, THROAT-clear, no lesions, no postnasal drip or exudate noted.   NECK:  Supple w/ fair ROM; no  JVD; normal carotid impulses w/o bruits; no thyromegaly or nodules palpated; no lymphadenopathy.  RESP  Clear  P & A; w/o, wheezes/ rales/ or rhonchi.no accessory muscle use, no dullness to percussion  CARD:  RRR, no m/r/g  , no peripheral edema, pulses intact, no cyanosis or clubbing.  GI:   Soft & nt; nml bowel sounds; no organomegaly or masses detected.  Musco: Warm bil, no deformities or joint swelling noted.  Muscle stimulators along lower legs   Neuro: alert, no focal deficits noted.    Skin: Warm, no lesions or rashes  CXR 03/15/2014 previously bilateral lung  opacities have cleared. Lungs are clear .        Assessment & Plan:

## 2014-03-19 NOTE — Progress Notes (Signed)
Reviewed & agree with plan  

## 2014-03-30 LAB — FUNGUS CULTURE W SMEAR: FUNGAL SMEAR: NONE SEEN

## 2016-07-15 ENCOUNTER — Ambulatory Visit: Payer: Self-pay | Admitting: Orthopedic Surgery

## 2016-07-29 ENCOUNTER — Ambulatory Visit: Payer: Self-pay | Admitting: Orthopedic Surgery

## 2016-07-29 NOTE — H&P (Signed)
TOTAL KNEE ADMISSION H&P  Patient is being admitted for left total knee arthroplasty.  Subjective:  Chief Complaint:left knee pain.  HPI: Randy Greene, 51 y.o. male, has a history of pain and functional disability in the left knee due to arthritis and has failed non-surgical conservative treatments for greater than 12 weeks to includeNSAID's and/or analgesics, corticosteriod injections, viscosupplementation injections, flexibility and strengthening excercises, use of assistive devices, weight reduction as appropriate and activity modification.  Onset of symptoms was gradual, starting >10 years ago with gradually worsening course since that time. The patient noted prior procedures on the knee to include  arthroscopy on the left knee(s).  Patient currently rates pain in the left knee(s) at 10 out of 10 with activity. Patient has night pain, worsening of pain with activity and weight bearing, pain that interferes with activities of daily living, pain with passive range of motion, crepitus and joint swelling.  Patient has evidence of subchondral cysts, subchondral sclerosis, periarticular osteophytes and joint space narrowing by imaging studies. There is no active infection.  Patient Active Problem List   Diagnosis Date Noted  . Agitation 03/01/2014  . Hypoxia 01/01/2014  . Chronic hypercapnic respiratory failure ([place]) 01/01/2014  . Metabolic alkalosis with chronic respiratory acidosis 01/01/2014  . OSA on CPAP 01/01/2014  . Acid reflux 07/17/2013  . Sepsis ([place]) 04/11/2013  . Hypokalemia 11/30/2012  . Transaminitis 11/29/2012  . Acute encephalopathy 11/29/2012  . MS (multiple sclerosis) ([place]) 11/29/2012  . Recurrent aspiration bronchitis/pneumonia ([place]) 11/30/2011   Past Medical History:  Diagnosis Date  . Acid reflux   . Aspiration pneumonia   . Enlarged prostate   . Hypercapnic respiratory failure, chronic   . Hypertension   . MS (multiple sclerosis)     Past Surgical History:   Procedure Laterality Date  . ARTHROSCOPIC    . HERNIA REPAIR       (Not in a hospital admission) Allergies  Allergen Reactions  . Food Nausea And Vomiting    GREEN PEPPERS- flushed  PT DOES NOT EAT MEAT  . Morphine And Related Other (See Comments)    [facility]  . Mushroom Extract Complex Nausea And Vomiting    And flushed  . Penicillins Hives    FAMILY HISTORY  . Zoloft [Sertraline Hcl] Other (See Comments)    [facility]  . Adhesive [Tape] Rash    Social History  Substance Use Topics  . Smoking status: Former Smoker    Packs/day: 1.00    Years: 20.00    Types: Cigarettes    Quit date: 11/02/2006  . Smokeless tobacco: Never Used  . Alcohol use No    No family history on file.   Review of Systems  Constitutional: Negative.   HENT: Negative.   Eyes: Negative.   Respiratory: Negative.   Cardiovascular: Negative.   Gastrointestinal: Negative.   Genitourinary: Negative.   Musculoskeletal: Positive for joint pain.  Skin: Negative.   Neurological: Negative.   Endo/Heme/Allergies: Negative.   Psychiatric/Behavioral: Negative.     Objective:  Physical Exam  Vitals reviewed. Constitutional: He is oriented to person, place, and time. He appears well-developed and well-nourished.  HENT:  Head: Normocephalic and atraumatic.  Eyes: Conjunctivae and EOM are normal. Pupils are equal, round, and reactive to light.  Neck: Normal range of motion. Neck supple.  Cardiovascular: Normal rate, regular rhythm and intact distal pulses.   Respiratory: Effort normal. No respiratory distress.  GI: Soft. He exhibits no distension.  Genitourinary:  Genitourinary Comments: Deferred  Musculoskeletal:       Left knee: He exhibits swelling, effusion and deformity. Tenderness found. Medial joint line and lateral joint line tenderness noted.  Neurological: He is alert and oriented to person, place, and time. He has normal reflexes.  Skin: Skin is warm and dry.  Psychiatric: He  has a normal mood and affect. His behavior is normal. Judgment and thought content normal.    Vital signs in last 24 hours: '@VSRANGES'$ @  Labs:   Estimated body mass index is 27.76 kg/m as calculated from the following:   Height as of 03/15/14: '6\' 2"'$  (1.88 m).   Weight as of 03/15/14: 98.1 kg (216 lb 3.2 oz).   Imaging Review Plain radiographs demonstrate severe degenerative joint disease of the left knee(s). The overall alignment issignificant varus. The bone quality appears to be adequate for age and reported activity level.  Assessment/Plan:  End stage arthritis, left knee   The patient history, physical examination, clinical judgment of the provider and imaging studies are consistent with end stage degenerative joint disease of the left knee(s) and total knee arthroplasty is deemed medically necessary. The treatment options including medical management, injection therapy arthroscopy and arthroplasty were discussed at length. The risks and benefits of total knee arthroplasty were presented and reviewed. The risks due to aseptic loosening, infection, stiffness, patella tracking problems, thromboembolic complications and other imponderables were discussed. The patient acknowledged the explanation, agreed to proceed with the plan and consent was signed. Patient is being admitted for inpatient treatment for surgery, pain control, PT, OT, prophylactic antibiotics, VTE prophylaxis, progressive ambulation and ADL's and discharge planning. The patient is planning to be discharged home with home health services. Plan for apixaban 30 days

## 2016-07-31 NOTE — Pre-Procedure Instructions (Signed)
    Randy Greene  07/31/2016      [id number] - [place], [place] [address] [place] [address] Phone: [phone] Fax: [phone]    Your procedure is scheduled on 08/10/16.  Report to [facility] Admitting at 945 A.M.  Call this number if you have problems the morning of surgery:  [phone]   Remember:  Do not eat food or drink liquids after midnight.  Take these medicines the morning of surgery with A SIP OF WATER --tylenol,all inhales,atenolol,baclofen,zyrtec,protonix,lyrica   Do not wear jewelry, make-up or nail polish.  Do not wear lotions, powders, or perfumes, or deoderant.  Do not shave 48 hours prior to surgery.  Men may shave face and neck.  Do not bring valuables to the hospital.  [facility] is not responsible for any belongings or valuables.  Contacts, dentures or bridgework may not be worn into surgery.  Leave your suitcase in the car.  After surgery it may be brought to your room.  For patients admitted to the hospital, discharge time will be determined by your treatment team.  Patients discharged the day of surgery will not be allowed to drive home.   Name and phone number of your driver:   Special instructions:  Do not take any aspirin,anti-inflammatories,vitamins,or herbal supplements 5-7 days prior to surgery.  Please read over the following fact sheets that you were given. MRSA Information

## 2016-08-03 ENCOUNTER — Encounter (HOSPITAL_COMMUNITY): Payer: Self-pay

## 2016-08-03 ENCOUNTER — Encounter (HOSPITAL_COMMUNITY)
Admission: RE | Admit: 2016-08-03 | Discharge: 2016-08-03 | Disposition: A | Discharge status: Home / Self Care | Payer: Non-veteran care | Source: Ambulatory Visit | Attending: Orthopedic Surgery | Admitting: Orthopedic Surgery

## 2016-08-03 DIAGNOSIS — M1712 Unilateral primary osteoarthritis, left knee: Secondary | ICD-10-CM | POA: Insufficient documentation

## 2016-08-03 DIAGNOSIS — Z01810 Encounter for preprocedural cardiovascular examination: Secondary | ICD-10-CM | POA: Insufficient documentation

## 2016-08-03 DIAGNOSIS — Z01812 Encounter for preprocedural laboratory examination: Secondary | ICD-10-CM | POA: Diagnosis present

## 2016-08-03 HISTORY — DX: Dyspnea, unspecified: R06.00

## 2016-08-03 HISTORY — DX: Unspecified asthma, uncomplicated: J45.909

## 2016-08-03 LAB — TYPE AND SCREEN
ABO/RH(D): A POS
ANTIBODY SCREEN: NEGATIVE

## 2016-08-03 LAB — CBC
HEMATOCRIT: 40.5 % (ref 39.0–52.0)
HEMOGLOBIN: 13.3 g/dL (ref 13.0–17.0)
MCH: 30.3 pg (ref 26.0–34.0)
MCHC: 32.8 g/dL (ref 30.0–36.0)
MCV: 92.3 fL (ref 78.0–100.0)
Platelets: 224 10*3/uL (ref 150–400)
RBC: 4.39 MIL/uL (ref 4.22–5.81)
RDW: 14.6 % (ref 11.5–15.5)
WBC: 6.8 10*3/uL (ref 4.0–10.5)

## 2016-08-03 LAB — SURGICAL PCR SCREEN
MRSA, PCR: NEGATIVE
STAPHYLOCOCCUS AUREUS: NEGATIVE

## 2016-08-03 LAB — BASIC METABOLIC PANEL
Anion gap: 9 (ref 5–15)
BUN: 7 mg/dL (ref 6–20)
CHLORIDE: 109 mmol/L (ref 101–111)
CO2: 24 mmol/L (ref 22–32)
CREATININE: 0.83 mg/dL (ref 0.61–1.24)
Calcium: 9.6 mg/dL (ref 8.9–10.3)
GFR calc non Af Amer: >60 mL/min (ref >60)
Glucose, Bld: 103 mg/dL — ABNORMAL HIGH (ref 65–99)
POTASSIUM: 4.6 mmol/L (ref 3.5–5.1)
SODIUM: 142 mmol/L (ref 135–145)

## 2016-08-03 LAB — ABO/RH: ABO/RH(D): A POS

## 2016-08-07 MED ORDER — VANCOMYCIN HCL 10 G IV SOLR
1500.0000 mg | INTRAVENOUS | Status: AC
  Administered 2016-08-10: 1500 mg via INTRAVENOUS
  Filled 2016-08-07 (×2): qty 1500

## 2016-08-07 MED ORDER — TRANEXAMIC ACID 1000 MG/10ML IV SOLN
1000.0000 mg | INTRAVENOUS | Status: AC
  Administered 2016-08-10: 1000 mg via INTRAVENOUS
  Filled 2016-08-07: qty 10

## 2016-08-10 ENCOUNTER — Encounter (HOSPITAL_COMMUNITY)
Admission: RE | Disposition: A | Discharge status: Home Health | Payer: Self-pay | Source: Ambulatory Visit | Attending: Orthopedic Surgery

## 2016-08-10 ENCOUNTER — Inpatient Hospital Stay (HOSPITAL_COMMUNITY): Payer: Non-veteran care | Admitting: Certified Registered Nurse Anesthetist

## 2016-08-10 ENCOUNTER — Encounter (HOSPITAL_COMMUNITY): Payer: Self-pay | Admitting: Certified Registered Nurse Anesthetist

## 2016-08-10 ENCOUNTER — Inpatient Hospital Stay (HOSPITAL_COMMUNITY): Payer: Non-veteran care

## 2016-08-10 ENCOUNTER — Inpatient Hospital Stay (HOSPITAL_COMMUNITY)
Admission: RE | Admit: 2016-08-10 | Discharge: 2016-08-11 | DRG: 470 | Disposition: A | Discharge status: Home Health | Payer: Non-veteran care | Source: Ambulatory Visit | Attending: Orthopedic Surgery | Admitting: Orthopedic Surgery

## 2016-08-10 DIAGNOSIS — Z880 Allergy status to penicillin: Secondary | ICD-10-CM | POA: Diagnosis not present

## 2016-08-10 DIAGNOSIS — J45909 Unspecified asthma, uncomplicated: Secondary | ICD-10-CM | POA: Diagnosis present

## 2016-08-10 DIAGNOSIS — Z79899 Other long term (current) drug therapy: Secondary | ICD-10-CM | POA: Diagnosis not present

## 2016-08-10 DIAGNOSIS — Z888 Allergy status to other drugs, medicaments and biological substances status: Secondary | ICD-10-CM | POA: Diagnosis not present

## 2016-08-10 DIAGNOSIS — J9610 Chronic respiratory failure, unspecified whether with hypoxia or hypercapnia: Secondary | ICD-10-CM | POA: Diagnosis present

## 2016-08-10 DIAGNOSIS — Z91048 Other nonmedicinal substance allergy status: Secondary | ICD-10-CM | POA: Diagnosis not present

## 2016-08-10 DIAGNOSIS — G35 Multiple sclerosis: Secondary | ICD-10-CM | POA: Diagnosis present

## 2016-08-10 DIAGNOSIS — Z09 Encounter for follow-up examination after completed treatment for conditions other than malignant neoplasm: Secondary | ICD-10-CM

## 2016-08-10 DIAGNOSIS — Z87891 Personal history of nicotine dependence: Secondary | ICD-10-CM | POA: Diagnosis not present

## 2016-08-10 DIAGNOSIS — M1712 Unilateral primary osteoarthritis, left knee: Principal | ICD-10-CM | POA: Diagnosis present

## 2016-08-10 DIAGNOSIS — I10 Essential (primary) hypertension: Secondary | ICD-10-CM | POA: Diagnosis present

## 2016-08-10 DIAGNOSIS — Z91018 Allergy to other foods: Secondary | ICD-10-CM | POA: Diagnosis not present

## 2016-08-10 DIAGNOSIS — K219 Gastro-esophageal reflux disease without esophagitis: Secondary | ICD-10-CM | POA: Diagnosis present

## 2016-08-10 HISTORY — PX: KNEE ARTHROPLASTY: SHX992

## 2016-08-10 SURGERY — ARTHROPLASTY, KNEE, TOTAL, USING IMAGELESS COMPUTER-ASSISTED NAVIGATION
Anesthesia: General | Laterality: Left

## 2016-08-10 MED ORDER — PANTOPRAZOLE SODIUM 40 MG PO TBEC
40.0000 mg | DELAYED_RELEASE_TABLET | Freq: Two times a day (BID) | ORAL | Status: DC
  Administered 2016-08-10 – 2016-08-11 (×2): 40 mg via ORAL
  Filled 2016-08-10 (×2): qty 1

## 2016-08-10 MED ORDER — METOCLOPRAMIDE HCL 5 MG/ML IJ SOLN: 5.0000 mg | Freq: Three times a day (TID) | INTRAMUSCULAR | Status: DC | PRN

## 2016-08-10 MED ORDER — FENTANYL CITRATE (PF) 100 MCG/2ML IJ SOLN
INTRAMUSCULAR | Status: AC
  Filled 2016-08-10: qty 2

## 2016-08-10 MED ORDER — BUPIVACAINE-EPINEPHRINE (PF) 0.5% -1:200000 IJ SOLN
INTRAMUSCULAR | Status: AC
  Filled 2016-08-10: qty 30

## 2016-08-10 MED ORDER — DONEPEZIL HCL 10 MG PO TABS
10.0000 mg | ORAL_TABLET | Freq: Every day | ORAL | Status: DC
  Administered 2016-08-10: 10 mg via ORAL
  Filled 2016-08-10: qty 1

## 2016-08-10 MED ORDER — BACID PO TABS
3.0000 | ORAL_TABLET | Freq: Two times a day (BID) | ORAL | Status: DC
  Administered 2016-08-10 – 2016-08-11 (×2): 3 via ORAL
  Filled 2016-08-10 (×2): qty 3

## 2016-08-10 MED ORDER — LIDOCAINE HCL (CARDIAC) 20 MG/ML IV SOLN
INTRAVENOUS | Status: DC | PRN
  Administered 2016-08-10: 60 mg via INTRAVENOUS

## 2016-08-10 MED ORDER — DRONABINOL 2.5 MG PO CAPS
7.5000 mg | ORAL_CAPSULE | Freq: Three times a day (TID) | ORAL | Status: DC
  Administered 2016-08-10 – 2016-08-11 (×3): 7.5 mg via ORAL
  Filled 2016-08-10 (×3): qty 3

## 2016-08-10 MED ORDER — MIDAZOLAM HCL 5 MG/5ML IJ SOLN
INTRAMUSCULAR | Status: DC | PRN
  Administered 2016-08-10 (×2): 1 mg via INTRAVENOUS

## 2016-08-10 MED ORDER — ONDANSETRON HCL 4 MG/2ML IJ SOLN
INTRAMUSCULAR | Status: DC | PRN
  Administered 2016-08-10: 4 mg via INTRAVENOUS

## 2016-08-10 MED ORDER — PHENOL 1.4 % MT LIQD: 1.0000 | OROMUCOSAL | Status: DC | PRN

## 2016-08-10 MED ORDER — MENTHOL 3 MG MT LOZG: 1.0000 | LOZENGE | OROMUCOSAL | Status: DC | PRN

## 2016-08-10 MED ORDER — 0.9 % SODIUM CHLORIDE (POUR BTL) OPTIME
TOPICAL | Status: DC | PRN
  Administered 2016-08-10: 1000 mL

## 2016-08-10 MED ORDER — MIDAZOLAM HCL 2 MG/2ML IJ SOLN
2.0000 mg | Freq: Once | INTRAMUSCULAR | Status: AC
  Administered 2016-08-10: 2 mg via INTRAVENOUS

## 2016-08-10 MED ORDER — KETOROLAC TROMETHAMINE 30 MG/ML IJ SOLN
INTRAMUSCULAR | Status: AC
  Filled 2016-08-10: qty 1

## 2016-08-10 MED ORDER — DIPHENHYDRAMINE HCL 12.5 MG/5ML PO ELIX: 12.5000 mg | ORAL_SOLUTION | ORAL | Status: DC | PRN

## 2016-08-10 MED ORDER — SODIUM CHLORIDE 0.9 % IV SOLN: INTRAVENOUS | Status: DC

## 2016-08-10 MED ORDER — SODIUM CHLORIDE 0.9 % IR SOLN
Status: DC | PRN
  Administered 2016-08-10: 3000 mL

## 2016-08-10 MED ORDER — DOCUSATE SODIUM 100 MG PO CAPS
100.0000 mg | ORAL_CAPSULE | Freq: Two times a day (BID) | ORAL | Status: DC
  Administered 2016-08-10 – 2016-08-11 (×2): 100 mg via ORAL
  Filled 2016-08-10 (×2): qty 1

## 2016-08-10 MED ORDER — ALBUTEROL SULFATE HFA 108 (90 BASE) MCG/ACT IN AERS: 1.0000 | INHALATION_SPRAY | Freq: Four times a day (QID) | RESPIRATORY_TRACT | Status: DC | PRN

## 2016-08-10 MED ORDER — DALFAMPRIDINE ER 10 MG PO TB12
10.0000 mg | ORAL_TABLET | Freq: Two times a day (BID) | ORAL | Status: DC
Start: 2016-08-10 — End: 2016-08-11

## 2016-08-10 MED ORDER — POVIDONE-IODINE 10 % EX SWAB: 2.0000 "application " | Freq: Once | CUTANEOUS | Status: DC

## 2016-08-10 MED ORDER — LORATADINE 10 MG PO TABS
10.0000 mg | ORAL_TABLET | Freq: Every day | ORAL | Status: DC
  Administered 2016-08-11: 10 mg via ORAL
  Filled 2016-08-10: qty 1

## 2016-08-10 MED ORDER — ALBUTEROL SULFATE (2.5 MG/3ML) 0.083% IN NEBU: 2.5000 mg | INHALATION_SOLUTION | Freq: Four times a day (QID) | RESPIRATORY_TRACT | Status: DC | PRN

## 2016-08-10 MED ORDER — SUCCINYLCHOLINE 20MG/ML (10ML) SYRINGE FOR MEDFUSION PUMP - OPTIME
INTRAMUSCULAR | Status: DC | PRN
Start: 2016-08-10 — End: 2016-08-10
  Administered 2016-08-10: 100 mg via INTRAVENOUS

## 2016-08-10 MED ORDER — ATENOLOL 50 MG PO TABS
50.0000 mg | ORAL_TABLET | Freq: Every day | ORAL | Status: DC
  Administered 2016-08-11: 50 mg via ORAL
  Filled 2016-08-10: qty 1

## 2016-08-10 MED ORDER — GABAPENTIN 300 MG PO CAPS
ORAL_CAPSULE | ORAL | Status: AC
  Filled 2016-08-10: qty 1

## 2016-08-10 MED ORDER — MIDAZOLAM HCL 2 MG/2ML IJ SOLN
INTRAMUSCULAR | Status: AC
  Filled 2016-08-10: qty 2

## 2016-08-10 MED ORDER — ONDANSETRON HCL 4 MG PO TABS: 4.0000 mg | ORAL_TABLET | Freq: Four times a day (QID) | ORAL | Status: DC | PRN

## 2016-08-10 MED ORDER — PROPOFOL 10 MG/ML IV BOLUS
INTRAVENOUS | Status: DC | PRN
  Administered 2016-08-10: 160 mg via INTRAVENOUS

## 2016-08-10 MED ORDER — VITAMIN C 500 MG PO TABS
500.0000 mg | ORAL_TABLET | Freq: Every day | ORAL | Status: DC
  Administered 2016-08-11: 500 mg via ORAL
  Filled 2016-08-10: qty 1

## 2016-08-10 MED ORDER — METHYLPHENIDATE HCL 5 MG PO TABS
20.0000 mg | ORAL_TABLET | Freq: Every day | ORAL | Status: DC
  Administered 2016-08-11: 20 mg via ORAL
  Filled 2016-08-10: qty 4

## 2016-08-10 MED ORDER — FENTANYL CITRATE (PF) 100 MCG/2ML IJ SOLN
INTRAMUSCULAR | Status: DC | PRN
  Administered 2016-08-10 (×2): 50 ug via INTRAVENOUS
  Administered 2016-08-10: 25 ug via INTRAVENOUS
  Administered 2016-08-10: 100 ug via INTRAVENOUS
  Administered 2016-08-10: 50 ug via INTRAVENOUS
  Administered 2016-08-10: 25 ug via INTRAVENOUS

## 2016-08-10 MED ORDER — DEXAMETHASONE SODIUM PHOSPHATE 10 MG/ML IJ SOLN
10.0000 mg | Freq: Once | INTRAMUSCULAR | Status: AC
  Administered 2016-08-11: 10 mg via INTRAVENOUS
  Filled 2016-08-10: qty 1

## 2016-08-10 MED ORDER — POLYETHYLENE GLYCOL 3350 17 G PO PACK: 17.0000 g | PACK | Freq: Every day | ORAL | Status: DC | PRN

## 2016-08-10 MED ORDER — HYDROMORPHONE HCL 2 MG/ML IJ SOLN
0.5000 mg | INTRAMUSCULAR | Status: DC | PRN
  Administered 2016-08-10 – 2016-08-11 (×6): 1 mg via INTRAVENOUS
  Filled 2016-08-10 (×6): qty 1

## 2016-08-10 MED ORDER — PREGABALIN 75 MG PO CAPS
150.0000 mg | ORAL_CAPSULE | Freq: Four times a day (QID) | ORAL | Status: DC
  Administered 2016-08-10 – 2016-08-11 (×3): 150 mg via ORAL
  Filled 2016-08-10: qty 6
  Filled 2016-08-10 (×2): qty 2

## 2016-08-10 MED ORDER — FENTANYL CITRATE (PF) 100 MCG/2ML IJ SOLN
50.0000 ug | Freq: Once | INTRAMUSCULAR | Status: AC
  Administered 2016-08-10: 50 ug via INTRAVENOUS

## 2016-08-10 MED ORDER — TRANEXAMIC ACID 1000 MG/10ML IV SOLN
1000.0000 mg | Freq: Once | INTRAVENOUS | Status: AC
  Administered 2016-08-10: 1000 mg via INTRAVENOUS
  Filled 2016-08-10: qty 10

## 2016-08-10 MED ORDER — GLYCOPYRROLATE 0.2 MG/ML IJ SOLN
INTRAMUSCULAR | Status: DC | PRN
  Administered 2016-08-10: 0.2 mg via INTRAVENOUS

## 2016-08-10 MED ORDER — HYDROMORPHONE HCL 1 MG/ML IJ SOLN
INTRAMUSCULAR | Status: AC
  Filled 2016-08-10: qty 2

## 2016-08-10 MED ORDER — PROPOFOL 10 MG/ML IV BOLUS
INTRAVENOUS | Status: AC
  Filled 2016-08-10: qty 40

## 2016-08-10 MED ORDER — CHLORHEXIDINE GLUCONATE 4 % EX LIQD: 60.0000 mL | Freq: Once | CUTANEOUS | Status: DC

## 2016-08-10 MED ORDER — BUPIVACAINE-EPINEPHRINE (PF) 0.5% -1:200000 IJ SOLN
INTRAMUSCULAR | Status: DC | PRN
  Administered 2016-08-10: 30 mL via PERINEURAL

## 2016-08-10 MED ORDER — APIXABAN 2.5 MG PO TABS
2.5000 mg | ORAL_TABLET | Freq: Two times a day (BID) | ORAL | Status: DC
  Administered 2016-08-11: 2.5 mg via ORAL
  Filled 2016-08-10: qty 1

## 2016-08-10 MED ORDER — FAMOTIDINE 20 MG PO TABS
20.0000 mg | ORAL_TABLET | Freq: Every day | ORAL | Status: DC
  Administered 2016-08-10: 20 mg via ORAL
  Filled 2016-08-10: qty 1

## 2016-08-10 MED ORDER — DOXAZOSIN MESYLATE 2 MG PO TABS
2.0000 mg | ORAL_TABLET | Freq: Every day | ORAL | Status: DC
  Administered 2016-08-10: 2 mg via ORAL
  Filled 2016-08-10: qty 1

## 2016-08-10 MED ORDER — ROPIVACAINE HCL 7.5 MG/ML IJ SOLN
INTRAMUSCULAR | Status: DC | PRN
  Administered 2016-08-10: 20 mL via PERINEURAL

## 2016-08-10 MED ORDER — METHOCARBAMOL 1000 MG/10ML IJ SOLN
500.0000 mg | Freq: Four times a day (QID) | INTRAVENOUS | Status: DC | PRN
  Administered 2016-08-10: 500 mg via INTRAVENOUS
  Filled 2016-08-10: qty 5

## 2016-08-10 MED ORDER — TAMSULOSIN HCL 0.4 MG PO CAPS
0.4000 mg | ORAL_CAPSULE | Freq: Every day | ORAL | Status: DC
  Administered 2016-08-10: 0.4 mg via ORAL
  Filled 2016-08-10: qty 1

## 2016-08-10 MED ORDER — AMLODIPINE BESYLATE 5 MG PO TABS
5.0000 mg | ORAL_TABLET | Freq: Every day | ORAL | Status: DC
  Administered 2016-08-10: 5 mg via ORAL
  Filled 2016-08-10: qty 1

## 2016-08-10 MED ORDER — HYDROCODONE-ACETAMINOPHEN 5-325 MG PO TABS
ORAL_TABLET | ORAL | Status: AC
  Filled 2016-08-10: qty 2

## 2016-08-10 MED ORDER — ACETAMINOPHEN 10 MG/ML IV SOLN
1000.0000 mg | INTRAVENOUS | Status: AC
  Administered 2016-08-10: 1000 mg via INTRAVENOUS
  Filled 2016-08-10: qty 100

## 2016-08-10 MED ORDER — HYDROMORPHONE HCL 1 MG/ML IJ SOLN
0.2500 mg | INTRAMUSCULAR | Status: DC | PRN
  Administered 2016-08-10 (×4): 0.5 mg via INTRAVENOUS

## 2016-08-10 MED ORDER — ONDANSETRON HCL 4 MG/2ML IJ SOLN
4.0000 mg | Freq: Four times a day (QID) | INTRAMUSCULAR | Status: DC | PRN
  Administered 2016-08-10: 4 mg via INTRAVENOUS
  Filled 2016-08-10: qty 2

## 2016-08-10 MED ORDER — METOCLOPRAMIDE HCL 5 MG PO TABS: 5.0000 mg | ORAL_TABLET | Freq: Three times a day (TID) | ORAL | Status: DC | PRN

## 2016-08-10 MED ORDER — KETOROLAC TROMETHAMINE 30 MG/ML IJ SOLN
INTRAMUSCULAR | Status: DC | PRN
  Administered 2016-08-10: 30 mg via INTRAVENOUS

## 2016-08-10 MED ORDER — KETOROLAC TROMETHAMINE 15 MG/ML IJ SOLN
15.0000 mg | Freq: Four times a day (QID) | INTRAMUSCULAR | Status: AC
  Administered 2016-08-10 – 2016-08-11 (×3): 15 mg via INTRAVENOUS
  Filled 2016-08-10 (×3): qty 1

## 2016-08-10 MED ORDER — SENNA 8.6 MG PO TABS
2.0000 | ORAL_TABLET | Freq: Every day | ORAL | Status: DC
  Administered 2016-08-10: 17.2 mg via ORAL
  Filled 2016-08-10: qty 2

## 2016-08-10 MED ORDER — ACETAMINOPHEN 650 MG RE SUPP: 650.0000 mg | Freq: Four times a day (QID) | RECTAL | Status: DC | PRN

## 2016-08-10 MED ORDER — MOMETASONE FURO-FORMOTEROL FUM 100-5 MCG/ACT IN AERO
2.0000 | INHALATION_SPRAY | Freq: Two times a day (BID) | RESPIRATORY_TRACT | Status: DC
  Administered 2016-08-10 – 2016-08-11 (×2): 2 via RESPIRATORY_TRACT
  Filled 2016-08-10: qty 8.8

## 2016-08-10 MED ORDER — METOCLOPRAMIDE HCL 10 MG PO TABS
10.0000 mg | ORAL_TABLET | Freq: Every day | ORAL | Status: DC
  Administered 2016-08-10: 10 mg via ORAL
  Filled 2016-08-10: qty 1

## 2016-08-10 MED ORDER — ALUM & MAG HYDROXIDE-SIMETH 200-200-20 MG/5ML PO SUSP
30.0000 mL | ORAL | Status: DC | PRN
  Administered 2016-08-11: 30 mL via ORAL
  Filled 2016-08-10: qty 30

## 2016-08-10 MED ORDER — HYDROCODONE-ACETAMINOPHEN 5-325 MG PO TABS
1.0000 | ORAL_TABLET | ORAL | Status: DC | PRN
  Administered 2016-08-10 – 2016-08-11 (×5): 2 via ORAL
  Filled 2016-08-10 (×4): qty 2

## 2016-08-10 MED ORDER — IPRATROPIUM-ALBUTEROL 0.5-2.5 (3) MG/3ML IN SOLN: 3.0000 mL | RESPIRATORY_TRACT | Status: DC | PRN

## 2016-08-10 MED ORDER — MONTELUKAST SODIUM 10 MG PO TABS
10.0000 mg | ORAL_TABLET | ORAL | Status: DC
  Administered 2016-08-11: 10 mg via ORAL
  Filled 2016-08-10: qty 1

## 2016-08-10 MED ORDER — METHOCARBAMOL 1000 MG/10ML IJ SOLN
500.0000 mg | INTRAVENOUS | Status: AC
  Administered 2016-08-10: 500 mg via INTRAVENOUS
  Filled 2016-08-10: qty 5

## 2016-08-10 MED ORDER — GABAPENTIN 300 MG PO CAPS
300.0000 mg | ORAL_CAPSULE | Freq: Once | ORAL | Status: AC
  Administered 2016-08-10: 300 mg via ORAL

## 2016-08-10 MED ORDER — KETAMINE HCL 100 MG/ML IJ SOLN
INTRAMUSCULAR | Status: AC
  Filled 2016-08-10: qty 1

## 2016-08-10 MED ORDER — SODIUM CHLORIDE 0.9 % IJ SOLN
INTRAMUSCULAR | Status: DC | PRN
  Administered 2016-08-10: 29 mL via INTRAVENOUS

## 2016-08-10 MED ORDER — ACETAMINOPHEN 325 MG PO TABS: 650.0000 mg | ORAL_TABLET | Freq: Four times a day (QID) | ORAL | Status: DC | PRN

## 2016-08-10 MED ORDER — VANCOMYCIN HCL IN DEXTROSE 1-5 GM/200ML-% IV SOLN
1000.0000 mg | Freq: Two times a day (BID) | INTRAVENOUS | Status: AC
  Administered 2016-08-10: 1000 mg via INTRAVENOUS
  Filled 2016-08-10: qty 200

## 2016-08-10 MED ORDER — KETAMINE HCL 100 MG/ML IJ SOLN
INTRAMUSCULAR | Status: DC | PRN
  Administered 2016-08-10 (×4): 20 mg via INTRAVENOUS

## 2016-08-10 MED ORDER — LACTATED RINGERS IV SOLN
INTRAVENOUS | Status: DC
  Administered 2016-08-10 (×2): via INTRAVENOUS

## 2016-08-10 MED ORDER — METHOCARBAMOL 500 MG PO TABS
500.0000 mg | ORAL_TABLET | Freq: Four times a day (QID) | ORAL | Status: DC | PRN
  Administered 2016-08-11 (×2): 500 mg via ORAL
  Filled 2016-08-10 (×2): qty 1

## 2016-08-10 SURGICAL SUPPLY — 53 items
ALCOHOL ISOPROPYL (RUBBING) (MISCELLANEOUS) ×2 IMPLANT
BANDAGE ACE 6X5 VEL STRL LF (GAUZE/BANDAGES/DRESSINGS) ×2 IMPLANT
BATTERY INSTRU NAVIGATION (MISCELLANEOUS) ×6 IMPLANT
BLADE SAW RECIP 87.9 MT (BLADE) ×2 IMPLANT
CAPT KNEE TRIATH TK-4 ×2 IMPLANT
CHLORAPREP W/TINT 26ML (MISCELLANEOUS) ×4 IMPLANT
CUFF TOURNIQUET SINGLE 34IN LL (TOURNIQUET CUFF) ×2 IMPLANT
DERMABOND ADVANCED (GAUZE/BANDAGES/DRESSINGS) ×2
DERMABOND ADVANCED .7 DNX12 (GAUZE/BANDAGES/DRESSINGS) ×2 IMPLANT
DRAIN HEMOVAC 7FR (DRAIN) IMPLANT
DRAPE SHEET LG 3/4 BI-LAMINATE (DRAPES) ×4 IMPLANT
DRAPE U-SHAPE 47X51 STRL (DRAPES) ×2 IMPLANT
DRAPE UNIVERSAL PACK (DRAPES) IMPLANT
DRSG AQUACEL AG ADV 3.5X14 (GAUZE/BANDAGES/DRESSINGS) ×2 IMPLANT
DRSG TEGADERM 2-3/8X2-3/4 SM (GAUZE/BANDAGES/DRESSINGS) ×2 IMPLANT
ELECT REM PT RETURN 9FT ADLT (ELECTROSURGICAL) ×2
ELECTRODE REM PT RTRN 9FT ADLT (ELECTROSURGICAL) ×1 IMPLANT
EVACUATOR 1/8 PVC DRAIN (DRAIN) IMPLANT
GLOVE BIO SURGEON STRL SZ8.5 (GLOVE) ×4 IMPLANT
GLOVE BIOGEL PI IND STRL 6 (GLOVE) ×1 IMPLANT
GLOVE BIOGEL PI IND STRL 7.0 (GLOVE) ×1 IMPLANT
GLOVE BIOGEL PI IND STRL 8.5 (GLOVE) ×1 IMPLANT
GLOVE BIOGEL PI INDICATOR 6 (GLOVE) ×1
GLOVE BIOGEL PI INDICATOR 7.0 (GLOVE) ×1
GLOVE BIOGEL PI INDICATOR 8.5 (GLOVE) ×1
GLOVE SURG SS PI 6.5 STRL IVOR (GLOVE) ×2 IMPLANT
GLOVE SURG SS PI 7.0 STRL IVOR (GLOVE) ×2 IMPLANT
GOWN STRL REUS W/ TWL LRG LVL3 (GOWN DISPOSABLE) ×2 IMPLANT
GOWN STRL REUS W/TWL 2XL LVL3 (GOWN DISPOSABLE) ×2 IMPLANT
GOWN STRL REUS W/TWL LRG LVL3 (GOWN DISPOSABLE) ×2
HANDPIECE INTERPULSE COAX TIP (DISPOSABLE) ×1
KIT BASIN OR (CUSTOM PROCEDURE TRAY) ×2 IMPLANT
MANIFOLD NEPTUNE II (INSTRUMENTS) ×2 IMPLANT
NEEDLE SPNL 18GX3.5 QUINCKE PK (NEEDLE) ×4 IMPLANT
PACK TOTAL JOINT (CUSTOM PROCEDURE TRAY) ×2 IMPLANT
PACK TOTAL KNEE CUSTOM (KITS) ×2 IMPLANT
PADDING CAST ABS 6INX4YD NS (CAST SUPPLIES) ×1
PADDING CAST ABS COTTON 6X4 NS (CAST SUPPLIES) ×1 IMPLANT
PADDING CAST COTTON 6X4 STRL (CAST SUPPLIES) ×2 IMPLANT
SAW OSC TIP CART 19.5X105X1.3 (SAW) ×2 IMPLANT
SEALER BIPOLAR AQUA 6.0 (INSTRUMENTS) ×4 IMPLANT
SET HNDPC FAN SPRY TIP SCT (DISPOSABLE) ×1 IMPLANT
SET PAD KNEE POSITIONER (MISCELLANEOUS) ×2 IMPLANT
SUT MNCRL AB 3-0 PS2 27 (SUTURE) ×2 IMPLANT
SUT MON AB 2-0 CT1 36 (SUTURE) ×4 IMPLANT
SUT VIC AB 1 CTX 27 (SUTURE) ×2 IMPLANT
SUT VIC AB 2-0 CT1 27 (SUTURE) ×1
SUT VIC AB 2-0 CT1 TAPERPNT 27 (SUTURE) ×1 IMPLANT
SUT VLOC 180 0 24IN GS25 (SUTURE) ×2 IMPLANT
SYR 50ML LL SCALE MARK (SYRINGE) ×4 IMPLANT
TOWER CARTRIDGE SMART MIX (DISPOSABLE) IMPLANT
TRAY CATH 16FR W/PLASTIC CATH (SET/KITS/TRAYS/PACK) IMPLANT
WRAP KNEE MAXI GEL POST OP (GAUZE/BANDAGES/DRESSINGS) ×2 IMPLANT

## 2016-08-10 NOTE — Anesthesia Procedure Notes (Signed)
Anesthesia Regional Block: Adductor canal block   Pre-Anesthetic Checklist: ,, timeout performed, Correct Patient, Correct Site, Correct Laterality, Correct Procedure, Correct Position, site marked, Risks and benefits discussed, pre-op evaluation,  At surgeon's request and post-op pain management  Laterality: Left  Prep: Maximum Sterile Barrier Precautions used, chloraprep       Needles:  Injection technique: Single-shot  Needle Type: Echogenic Stimulator Needle     Needle Length: 9cm  Needle Gauge: 21     Additional Needles:   Procedures: ultrasound guided,,,,,,,,  Narrative:  Start time: 08/10/2016 10:45 AM End time: 08/10/2016 10:55 AM Injection made incrementally with aspirations every 5 mL. Anesthesiologist: Roderic Palau  Additional Notes: 2% Lidocaine skin wheel.

## 2016-08-10 NOTE — Anesthesia Preprocedure Evaluation (Addendum)
Anesthesia Evaluation  Patient identified by MRN, date of birth, ID band Patient awake    Reviewed: Allergy & Precautions, H&P , NPO status , Patient's Chart, lab work & pertinent test results, reviewed documented beta blocker date and time   Airway Mallampati: II  TM Distance: >3 FB Neck ROM: Full    Dental no notable dental hx. (+) Teeth Intact, Dental Advisory Given   Pulmonary asthma , former smoker,    Pulmonary exam normal breath sounds clear to auscultation       Cardiovascular hypertension, Pt. on medications and Pt. on home beta blockers  Rhythm:Regular Rate:Normal     Neuro/Psych MS    GI/Hepatic Neg liver ROS, GERD  Medicated and Poorly Controlled,  Endo/Other  negative endocrine ROS  Renal/GU negative Renal ROS  negative genitourinary   Musculoskeletal   Abdominal   Peds  Hematology negative hematology ROS (+)   Anesthesia Other Findings   Reproductive/Obstetrics negative OB ROS                           Anesthesia Physical Anesthesia Plan  ASA: III  Anesthesia Plan: General   Post-op Pain Management:  Regional for Post-op pain   Induction: Intravenous, Rapid sequence and Cricoid pressure planned  Airway Management Planned: Oral ETT  Additional Equipment:   Intra-op Plan:   Post-operative Plan: Extubation in OR  Informed Consent: I have reviewed the patients History and Physical, chart, labs and discussed the procedure including the risks, benefits and alternatives for the proposed anesthesia with the patient or authorized representative who has indicated his/her understanding and acceptance.   Dental advisory given  Plan Discussed with: CRNA  Anesthesia Plan Comments:        Anesthesia Quick Evaluation

## 2016-08-10 NOTE — Interval H&P Note (Signed)
History and Physical Interval Note:  08/10/2016 11:34 AM  Randy Greene  has presented today for surgery, with the diagnosis of Degenerative joint disease left knee  The various methods of treatment have been discussed with the patient and family. After consideration of risks, benefits and other options for treatment, the patient has consented to  Procedure(s): LEFT TOTAL KNEE ARTHROPLASTY WITH COMPUTER NAVIGATION (Left) as a surgical intervention .  The patient's history has been reviewed, patient examined, no change in status, stable for surgery.  I have reviewed the patient's chart and labs.  Questions were answered to the patient's satisfaction.    The risks, benefits, and alternatives were discussed with the patient. There are risks associated with the surgery including, but not limited to, problems with anesthesia (death), infection, instability (giving out of the joint), dislocation, differences in leg length/angulation/rotation, fracture of bones, loosening or failure of implants, hematoma (blood accumulation) which may require surgical drainage, blood clots, pulmonary embolism, nerve injury (foot drop and lateral thigh numbness), and blood vessel injury. The patient understands these risks and elects to proceed.    Amahia Madonia, Horald Pollen

## 2016-08-10 NOTE — Anesthesia Postprocedure Evaluation (Addendum)
Anesthesia Post Note  Patient: Randy Greene  Procedure(s) Performed: Procedure(s) (LRB): LEFT TOTAL KNEE ARTHROPLASTY WITH COMPUTER NAVIGATION (Left)  Patient location during evaluation: PACU Anesthesia Type: General Level of consciousness: awake and alert Pain management: pain level controlled Vital Signs Assessment: post-procedure vital signs reviewed and stable Respiratory status: spontaneous breathing and respiratory function stable Cardiovascular status: blood pressure returned to baseline and stable Postop Assessment: spinal receding Anesthetic complications: no       Last Vitals:  Vitals:   08/10/16 1615 08/10/16 1630  BP: 124/79 (!) 126/97  Pulse: 73 66  Resp: 11 10  Temp:  36.6 C    Last Pain:  Vitals:   08/10/16 1717  TempSrc:   PainSc: 7                  [name] DANIEL

## 2016-08-10 NOTE — Anesthesia Procedure Notes (Signed)
Procedure Name: Intubation Date/Time: 08/10/2016 11:57 AM Performed by: Valda Favia Pre-anesthesia Checklist: Patient identified, Emergency Drugs available, Suction available, Patient being monitored and Timeout performed Patient Re-evaluated:Patient Re-evaluated prior to inductionOxygen Delivery Method: Circle system utilized Preoxygenation: Pre-oxygenation with 100% oxygen Intubation Type: IV induction and Rapid sequence Laryngoscope Size: Mac and 4 Grade View: Grade II Tube type: Oral Tube size: 7.5 mm Number of attempts: 1 Airway Equipment and Method: Stylet Placement Confirmation: ETT inserted through vocal cords under direct vision,  positive ETCO2 and breath sounds checked- equal and bilateral Secured at: 22 cm Tube secured with: Tape Dental Injury: Teeth and Oropharynx as per pre-operative assessment

## 2016-08-10 NOTE — Op Note (Signed)
OPERATIVE REPORT  SURGEON: Rod Can, MD   ASSISTANT: April Green, RNFA.  PREOPERATIVE DIAGNOSIS: Left knee arthritis.   POSTOPERATIVE DIAGNOSIS: Left knee arthritis.   PROCEDURE: Left total knee arthroplasty.   IMPLANTS: Stryker Triathlon CR femur, size 7. Stryker Tritanium tibia, size 6. X3 polyethelyene insert, size 9 mm, CR. 3 button asymmetric patella, size 38 mm.  ANESTHESIA:  Regional and Spinal  TOURNIQUET TIME: Not utilized.   ESTIMATED BLOOD LOSS: 400 mL.  ANTIBIOTICS: 1 g vancomycin.  DRAINS: None.  COMPLICATIONS: None   CONDITION: PACU - hemodynamically stable.   BRIEF CLINICAL NOTE: Randy Greene is a 51 y.o. male with a long-standing history of Left knee arthritis. After failing conservative management, the patient was indicated for total knee arthroplasty. The risks, benefits, and alternatives to the procedure were explained, and the patient elected to proceed.  PROCEDURE IN DETAIL: The surgical site was marked, and an adductor canal was placed in the pre-op holding area. Once inside the operative room, spinal anesthesia was obtained , and a foley catheter was inserted. The patient was then positioned, a nonsterile tourniquet was placed, and the lower extremity was prepped and draped in the normal sterile surgical fashion. A time-out was called verifying side and site of surgery. The patient received IV antibiotics within 60 minutes of beginning the procedure. The tourniquet was not utilized.  An anterior approach to the knee was performed utilizing a midvastus arthrotomy. A medial release was performed and the patellar fat pad was excised. Stryker navigation was used to cut the distal femur perpendicular to the mechanical axis. A freehand patellar resection was performed, and the patella was sized an prepared with 3 lug holes.  Nagivation was used to make a  neutral proximal tibia resection, taking 8 mm of bone from the less affected lateral side with 3 degrees of slope. The menisci were excised. A spacer block was placed, and the alignment and balance in extension were confirmed.   The distal femur was sized using the 3-degree external rotation guide referencing the posterior femoral cortex. The appropriate 4-in-1 cutting block was pinned into place. Rotation was checked using Whiteside's line, the epicondylar axis, and then confirmed with a spacer block in flexion. The remaining femoral cuts were performed, taking care to protect the MCL.  The tibia was sized and the trial tray was pinned into place. The remaining trail components were inserted. The knee was stable to varus and valgus stress through a full range of motion. The patella tracked centrally, and the PCL was well balanced. The trial components were removed, and the proximal tibial surface was prepared. Final components were impacted into place. The knee was tested for a final time and found to be well balanced.  The wound was copiously irrigated with a dilute betadine solution followed by normal saline with pulse lavage. Marcaine solution was injected into the periarticular soft tissue. The wound was closed in layers using #1 Vicryl and V-Loc for the fascia, 2-0 Vicryl for the subcutaneous fat, 2-0 Monocryl for the deep dermal layer, 3-0 running Monocryl subcuticular Stitch, and Dermabond for the skin. Once the glue was fully dried, an Aquacell Ag and compressive dressing were applied. Tthe patient was transported to the recovery room in stable condition. Sponge, needle, and instrument counts were correct at the end of the case x2. The patient tolerated the procedure well and there were no known complications.

## 2016-08-10 NOTE — Transfer of Care (Signed)
Immediate Anesthesia Transfer of Care Note  Patient: Randy Greene  Procedure(s) Performed: Procedure(s): LEFT TOTAL KNEE ARTHROPLASTY WITH COMPUTER NAVIGATION (Left)  Patient Location: PACU  Anesthesia Type:GA combined with regional for post-op pain  Level of Consciousness: awake and alert   Airway & Oxygen Therapy: Patient Spontanous Breathing and Patient connected to nasal cannula oxygen  Post-op Assessment: Report given to RN and Post -op Vital signs reviewed and stable  Post vital signs: Reviewed and stable  Last Vitals:  Vitals:   08/10/16 1127 08/10/16 1430  BP: (!) 136/93 (!) 143/93  Pulse: (!) 59 80  Resp: 15 11  Temp:  36.6 C    Last Pain:  Vitals:   08/10/16 0946  TempSrc: Oral         Complications: No apparent anesthesia complications

## 2016-08-10 NOTE — H&P (View-Only) (Signed)
TOTAL KNEE ADMISSION H&P  Patient is being admitted for left total knee arthroplasty.  Subjective:  Chief Complaint:left knee pain.  HPI: Randy Greene, 51 y.o. male, has a history of pain and functional disability in the left knee due to arthritis and has failed non-surgical conservative treatments for greater than 12 weeks to includeNSAID's and/or analgesics, corticosteriod injections, viscosupplementation injections, flexibility and strengthening excercises, use of assistive devices, weight reduction as appropriate and activity modification.  Onset of symptoms was gradual, starting >10 years ago with gradually worsening course since that time. The patient noted prior procedures on the knee to include  arthroscopy on the left knee(s).  Patient currently rates pain in the left knee(s) at 10 out of 10 with activity. Patient has night pain, worsening of pain with activity and weight bearing, pain that interferes with activities of daily living, pain with passive range of motion, crepitus and joint swelling.  Patient has evidence of subchondral cysts, subchondral sclerosis, periarticular osteophytes and joint space narrowing by imaging studies. There is no active infection.  Patient Active Problem List   Diagnosis Date Noted  . Agitation 03/01/2014  . Hypoxia 01/01/2014  . Chronic hypercapnic respiratory failure ([place]) 01/01/2014  . Metabolic alkalosis with chronic respiratory acidosis 01/01/2014  . OSA on CPAP 01/01/2014  . Acid reflux 07/17/2013  . Sepsis ([place]) 04/11/2013  . Hypokalemia 11/30/2012  . Transaminitis 11/29/2012  . Acute encephalopathy 11/29/2012  . MS (multiple sclerosis) ([place]) 11/29/2012  . Recurrent aspiration bronchitis/pneumonia ([place]) 11/30/2011   Past Medical History:  Diagnosis Date  . Acid reflux   . Aspiration pneumonia   . Enlarged prostate   . Hypercapnic respiratory failure, chronic   . Hypertension   . MS (multiple sclerosis)     Past Surgical History:   Procedure Laterality Date  . ARTHROSCOPIC    . HERNIA REPAIR       (Not in a hospital admission) Allergies  Allergen Reactions  . Food Nausea And Vomiting    GREEN PEPPERS- flushed  PT DOES NOT EAT MEAT  . Morphine And Related Other (See Comments)    [facility]  . Mushroom Extract Complex Nausea And Vomiting    And flushed  . Penicillins Hives    FAMILY HISTORY  . Zoloft [Sertraline Hcl] Other (See Comments)    [facility]  . Adhesive [Tape] Rash    Social History  Substance Use Topics  . Smoking status: Former Smoker    Packs/day: 1.00    Years: 20.00    Types: Cigarettes    Quit date: 11/02/2006  . Smokeless tobacco: Never Used  . Alcohol use No    No family history on file.   Review of Systems  Constitutional: Negative.   HENT: Negative.   Eyes: Negative.   Respiratory: Negative.   Cardiovascular: Negative.   Gastrointestinal: Negative.   Genitourinary: Negative.   Musculoskeletal: Positive for joint pain.  Skin: Negative.   Neurological: Negative.   Endo/Heme/Allergies: Negative.   Psychiatric/Behavioral: Negative.     Objective:  Physical Exam  Vitals reviewed. Constitutional: He is oriented to person, place, and time. He appears well-developed and well-nourished.  HENT:  Head: Normocephalic and atraumatic.  Eyes: Conjunctivae and EOM are normal. Pupils are equal, round, and reactive to light.  Neck: Normal range of motion. Neck supple.  Cardiovascular: Normal rate, regular rhythm and intact distal pulses.   Respiratory: Effort normal. No respiratory distress.  GI: Soft. He exhibits no distension.  Genitourinary:  Genitourinary Comments: Deferred  Musculoskeletal:       Left knee: He exhibits swelling, effusion and deformity. Tenderness found. Medial joint line and lateral joint line tenderness noted.  Neurological: He is alert and oriented to person, place, and time. He has normal reflexes.  Skin: Skin is warm and dry.  Psychiatric: He  has a normal mood and affect. His behavior is normal. Judgment and thought content normal.    Vital signs in last 24 hours: '@VSRANGES'$ @  Labs:   Estimated body mass index is 27.76 kg/m as calculated from the following:   Height as of 03/15/14: '6\' 2"'$  (1.88 m).   Weight as of 03/15/14: 98.1 kg (216 lb 3.2 oz).   Imaging Review Plain radiographs demonstrate severe degenerative joint disease of the left knee(s). The overall alignment issignificant varus. The bone quality appears to be adequate for age and reported activity level.  Assessment/Plan:  End stage arthritis, left knee   The patient history, physical examination, clinical judgment of the provider and imaging studies are consistent with end stage degenerative joint disease of the left knee(s) and total knee arthroplasty is deemed medically necessary. The treatment options including medical management, injection therapy arthroscopy and arthroplasty were discussed at length. The risks and benefits of total knee arthroplasty were presented and reviewed. The risks due to aseptic loosening, infection, stiffness, patella tracking problems, thromboembolic complications and other imponderables were discussed. The patient acknowledged the explanation, agreed to proceed with the plan and consent was signed. Patient is being admitted for inpatient treatment for surgery, pain control, PT, OT, prophylactic antibiotics, VTE prophylaxis, progressive ambulation and ADL's and discharge planning. The patient is planning to be discharged home with home health services. Plan for apixaban 30 days

## 2016-08-11 ENCOUNTER — Encounter (HOSPITAL_COMMUNITY): Payer: Self-pay | Admitting: Orthopedic Surgery

## 2016-08-11 DIAGNOSIS — M1712 Unilateral primary osteoarthritis, left knee: Secondary | ICD-10-CM | POA: Diagnosis present

## 2016-08-11 LAB — BASIC METABOLIC PANEL
ANION GAP: 8 (ref 5–15)
BUN: 6 mg/dL (ref 6–20)
CALCIUM: 8.5 mg/dL — AB (ref 8.9–10.3)
CHLORIDE: 105 mmol/L (ref 101–111)
CO2: 24 mmol/L (ref 22–32)
Creatinine, Ser: 0.81 mg/dL (ref 0.61–1.24)
GFR calc non Af Amer: >60 mL/min (ref >60)
GLUCOSE: 104 mg/dL — AB (ref 65–99)
Potassium: 3.7 mmol/L (ref 3.5–5.1)
Sodium: 137 mmol/L (ref 135–145)

## 2016-08-11 LAB — CBC
HEMATOCRIT: 33.3 % — AB (ref 39.0–52.0)
HEMOGLOBIN: 10.7 g/dL — AB (ref 13.0–17.0)
MCH: 30.1 pg (ref 26.0–34.0)
MCHC: 32.1 g/dL (ref 30.0–36.0)
MCV: 93.8 fL (ref 78.0–100.0)
Platelets: 167 10*3/uL (ref 150–400)
RBC: 3.55 MIL/uL — ABNORMAL LOW (ref 4.22–5.81)
RDW: 15.2 % (ref 11.5–15.5)
WBC: 8.5 10*3/uL (ref 4.0–10.5)

## 2016-08-11 MED ORDER — HYDROCODONE-ACETAMINOPHEN 5-325 MG PO TABS: 1.0000 | ORAL_TABLET | ORAL | 0 refills | Status: DC | PRN

## 2016-08-11 MED ORDER — DOCUSATE SODIUM 100 MG PO CAPS: 100.0000 mg | ORAL_CAPSULE | Freq: Two times a day (BID) | ORAL | 1 refills | Status: DC

## 2016-08-11 MED ORDER — SENNA 8.6 MG PO TABS: 2.0000 | ORAL_TABLET | Freq: Every day | ORAL | 0 refills | Status: DC

## 2016-08-11 MED ORDER — APIXABAN 2.5 MG PO TABS: 2.5000 mg | ORAL_TABLET | Freq: Two times a day (BID) | ORAL | 0 refills | Status: DC

## 2016-08-11 NOTE — Evaluation (Signed)
Physical Therapy Evaluation Patient Details Name: Randy Greene MRN: [id number] DOB: 1965/08/17 Today's Date: 08/11/2016   History of Present Illness  51 y.o. male s/p Lt TKA. PMH: MS, HTN, asthma.   Clinical Impression  Pt is s/p TKA resulting in the deficits listed below (see PT Problem List). Pt able to ambulate 75 ft with rw and min guard assistance. Anticipating D/C to home with sister to provide assistance initially. Pt will benefit from skilled PT to increase their independence and safety with mobility to allow discharge to home.      Follow Up Recommendations Home health PT;Supervision for mobility/OOB    Equipment Recommendations  Rolling walker with 5" wheels    Recommendations for Other Services       Precautions / Restrictions Precautions Precautions: Knee;Fall Precaution Booklet Issued: Yes (comment) Precaution Comments: HEP, reviewed knee extension precautions Restrictions Weight Bearing Restrictions: Yes LLE Weight Bearing: Weight bearing as tolerated      Mobility  Bed Mobility Overal bed mobility: Modified Independent             General bed mobility comments: HOB elevated, using rail  Transfers Overall transfer level: Needs assistance Equipment used: Rolling walker (2 wheeled) Transfers: Sit to/from Stand Sit to Stand: Min guard         General transfer comment: cues for hand placement  Ambulation/Gait Ambulation/Gait assistance: Min guard Ambulation Distance (Feet): 75 Feet Assistive device: Rolling walker (2 wheeled) Gait Pattern/deviations: Step-to pattern;Step-through pattern;Decreased weight shift to left Gait velocity: decreased   General Gait Details: progressing with cues from step-to pattern to step-through.   Stairs            Wheelchair Mobility    Modified Rankin (Stroke Patients Only)       Balance Overall balance assessment: Needs assistance Sitting-balance support: No upper extremity supported Sitting  balance-Leahy Scale: Good     Standing balance support: Bilateral upper extremity supported Standing balance-Leahy Scale: Poor Standing balance comment: using rw                             Pertinent Vitals/Pain Pain Assessment: 0-10 Pain Score: 8  Pain Location: Lt knee Pain Descriptors / Indicators: Aching Pain Intervention(s): Limited activity within patient's tolerance;Monitored during session;RN gave pain meds during session;Ice applied    Home Living Family/patient expects to be discharged to:: Private residence Living Arrangements: Alone Available Help at Discharge: Family;Available 24 hours/day Type of Home: Apartment Home Access: Level entry     Home Layout: One level Home Equipment: Cane - single point;Electric scooter (handicap Lucianne Lei) Additional Comments: Sister going to stay with pt following D/C. Pt lives in handicap modified apartment.     Prior Function Level of Independence: Independent with assistive device(s)         Comments: using SPC in the home and electric scooter for community distances.      Hand Dominance        Extremity/Trunk Assessment   Upper Extremity Assessment Upper Extremity Assessment: Overall WFL for tasks assessed    Lower Extremity Assessment Lower Extremity Assessment: LLE deficits/detail LLE Deficits / Details: able to perform SLR       Communication   Communication: No difficulties  Cognition Arousal/Alertness: Awake/alert Behavior During Therapy: WFL for tasks assessed/performed Overall Cognitive Status: Within Functional Limits for tasks assessed                      General Comments  Exercises     Assessment/Plan    PT Assessment Patient needs continued PT services  PT Problem List Decreased strength;Decreased range of motion;Decreased balance;Decreased activity tolerance;Decreased mobility;Decreased knowledge of use of DME       PT Treatment Interventions DME instruction;Gait  training;Stair training;Functional mobility training;Therapeutic activities;Therapeutic exercise;Patient/family education    PT Goals (Current goals can be found in the Care Plan section)  Acute Rehab PT Goals Patient Stated Goal: go home from the hospital PT Goal Formulation: With patient Time For Goal Achievement: 08/25/16 Potential to Achieve Goals: Good    Frequency 7X/week   Barriers to discharge        Co-evaluation               End of Session Equipment Utilized During Treatment: Gait belt Activity Tolerance: Patient tolerated treatment well Patient left: in chair;with call bell/phone within reach;with family/visitor present (in knee extension) Nurse Communication: Mobility status PT Visit Diagnosis: Unsteadiness on feet (R26.81);Muscle weakness (generalized) (M62.81);Difficulty in walking, not elsewhere classified (R26.2)         Time: 0824-0900 PT Time Calculation (min) (ACUTE ONLY): 36 min   Charges:   PT Evaluation $PT Eval Moderate Complexity: 1 Procedure PT Treatments $Gait Training: 8-22 mins   PT G Codes:         Cassell Clement, PT, CSCS Pager [phone] Office [phone]  08/11/2016, 9:12 AM

## 2016-08-11 NOTE — Progress Notes (Signed)
Patient urinated again 600 ml of clear yellow urine without difficulty

## 2016-08-11 NOTE — Progress Notes (Addendum)
Randy Greene to be D/C'd Home per MD order.  Discussed with the patient and all questions fully answered. Per patient's RN Althia Forts, okay to discharge home at this time. Handout and education provided about apixaban. Ted hose on and education provided to patient about use.    An After Visit Summary was printed and given to the patient. Patient received prescription.  D/c education completed with patient/family including follow up instructions, medication list, d/c activities limitations if indicated, with other d/c instructions as indicated by MD - patient able to verbalize understanding, all questions fully answered.   Patient instructed to return to ED, call 911, or call MD for any changes in condition.   Patient to be escorted via [place], and D/C home via private auto.  Hall Birchard C 08/11/2016 1:43 PM

## 2016-08-11 NOTE — Progress Notes (Signed)
Physical Therapy Treatment Patient Details Name: Randy Greene MRN: [id number] DOB: [birth year] Today's Date: 08/11/2016    History of Present Illness 51 y.o. male s/p Lt TKA. PMH: MS, HTN, asthma.     PT Comments    Patient is making good progress with PT.  From a mobility standpoint anticipate patient will be ready for DC home with family assist. Patient denies any questions or concerns.        Follow Up Recommendations  Home health PT;Supervision for mobility/OOB     Equipment Recommendations  Rolling walker with 5" wheels    Recommendations for Other Services       Precautions / Restrictions Precautions Precautions: Knee Precaution Comments: reviewed knee extension precautions Restrictions Weight Bearing Restrictions: Yes LLE Weight Bearing: Weight bearing as tolerated    Mobility  Bed Mobility               General bed mobility comments: up in chair upon arrival  Transfers Overall transfer level: Needs assistance Equipment used: Rolling walker (2 wheeled) Transfers: Sit to/from Stand Sit to Stand: Min guard         General transfer comment: good hand placement, guard for safety  Ambulation/Gait Ambulation/Gait assistance: Min guard Ambulation Distance (Feet): 110 Feet Assistive device: Rolling walker (2 wheeled) Gait Pattern/deviations: Step-through pattern Gait velocity: decreased   General Gait Details: cues for even strides and heel toe pattern   Stairs            Wheelchair Mobility    Modified Rankin (Stroke Patients Only)       Balance Overall balance assessment: Needs assistance Sitting-balance support: No upper extremity supported Sitting balance-Leahy Scale: Good     Standing balance support: Bilateral upper extremity supported;During functional activity Standing balance-Leahy Scale: Fair Standing balance comment: using rw for ambulation                    Cognition Arousal/Alertness: Awake/alert Behavior  During Therapy: WFL for tasks assessed/performed Overall Cognitive Status: Within Functional Limits for tasks assessed                      Exercises      General Comments        Pertinent Vitals/Pain Pain Assessment: 0-10 Pain Score: 4  Pain Location: Lt knee Pain Descriptors / Indicators: Aching Pain Intervention(s): Limited activity within patient's tolerance;Monitored during session    Home Living Family/patient expects to be discharged to:: (P) Private residence Living Arrangements: (P) Alone Available Help at Discharge: (P) Family;Available 24 hours/day Type of Home: (P) Apartment Home Access: (P) Level entry   Home Layout: (P) One level Home Equipment: (P) Cane - single point;Electric scooter;Other (comment);Tub bench (handicapped Lucianne Lei) Additional Comments: (P) Sister going to stay with pt following D/C. Pt lives in handicap modified apartment.     Prior Function Level of Independence: (P) Independent with assistive device(s)      Comments: (P) using SPC in the home and electric scooter for community distances.    PT Goals (current goals can now be found in the care plan section) Acute Rehab PT Goals Patient Stated Goal: home PT Goal Formulation: With patient Time For Goal Achievement: 08/25/16 Potential to Achieve Goals: Good Progress towards PT goals: Progressing toward goals    Frequency    7X/week      PT Plan Current plan remains appropriate    Co-evaluation             End of  Session Equipment Utilized During Treatment: Gait belt Activity Tolerance: Patient tolerated treatment well Patient left: in chair;with call bell/phone within reach;with family/visitor present Nurse Communication: Mobility status PT Visit Diagnosis: Unsteadiness on feet (R26.81);Muscle weakness (generalized) (M62.81);Difficulty in walking, not elsewhere classified (R26.2)     Time: 2217-9810 PT Time Calculation (min) (ACUTE ONLY): 23 min  Charges:  $Gait  Training: 8-22 mins $Therapeutic Exercise: 8-22 mins                    G Codes:       Cassell Clement, PT, CSCS Pager [phone] Office [phone]  08/11/2016, 1:59 PM

## 2016-08-11 NOTE — Progress Notes (Signed)
Patient could urinate, bladder scanned with 700 ml of urine in the bladder, MD was paged, order received to insert Foley cathter and remove it in am, patient refused to be catheterized, patient was given 2 cups of cranberry juice at 2200. Patient just urinates 500 ml of dark yellow urine with difficulty. Will continue to monitor.

## 2016-08-11 NOTE — Care Management Note (Addendum)
Case Management Note  Patient Details  Name: Randy Greene MRN: [id number] Date of Birth: 1965-09-28  Subjective/Objective:                Patient form home with wife, has cane. Admitted with repair to left knee. Will DC to home with [facility] established through [facility]. Loura Back clinical liaison consulted and has spoke with patient. Patient ordered walker and 3in1. LM with [facility] [phone] ext [phone]) requesting DME, awaiting response. Orders faxed to PCP Dr Arletha Grippe at [facility] at [phone]    Action/Plan:  DC to home today with [facility] and DME. Addendum: Patient stated he would like to pick up his DME at [facility], he called and spoke with [name], stated he would need a print out of orders. CM provided pt with this. No further CM needs.  Expected Discharge Date:  08/11/16               Expected Discharge Plan:  [place]  In-House Referral:     Discharge planning Services  CM Consult  Post Acute Care Choice:  Durable Medical Equipment, Home Health Choice offered to:  Patient  DME Arranged:  3-N-1, Walker rolling DME Agency:  Other - Comment ([facility] [place])  [place] Arranged:  PT [place]:  [facility] (now [facility])  Status of Service:  Completed, signed off  If discussed at [employer] of Stay Meetings, dates discussed:    Additional Comments:  Carles Collet, RN 08/11/2016, 10:48 AM

## 2016-08-11 NOTE — Progress Notes (Signed)
   Subjective:  Patient reports pain as mild to moderate.  Denies N/V/CP/SOB.  Objective:   VITALS:   Vitals:   08/10/16 1941 08/10/16 2150 08/11/16 0616 08/11/16 0739  BP:  123/72 112/67   Pulse:  72 (!) 56   Resp:  16 16   Temp:  98.1 F (36.7 C) 97.8 F (36.6 C)   TempSrc:  Oral Oral   SpO2: 97% 92% 99% 95%  Weight:        NAD ABD soft Sensation intact distally Intact pulses distally Dorsiflexion/Plantar flexion intact Incision: dressing C/D/I Compartment soft   Lab Results  Component Value Date   WBC 8.5 08/11/2016   HGB 10.7 (L) 08/11/2016   HCT 33.3 (L) 08/11/2016   MCV 93.8 08/11/2016   PLT 167 08/11/2016   BMET    Component Value Date/Time   NA 137 08/11/2016 0518   K 3.7 08/11/2016 0518   CL 105 08/11/2016 0518   CO2 24 08/11/2016 0518   GLUCOSE 104 (H) 08/11/2016 0518   BUN 6 08/11/2016 0518   CREATININE 0.81 08/11/2016 0518   CALCIUM 8.5 (L) 08/11/2016 0518   GFRNONAA >60 08/11/2016 0518   GFRAA >60 08/11/2016 0518     Assessment/Plan: 1 Day Post-Op   Principal Problem:   Osteoarthritis of left knee   WBAT with walker DVT ppx: apixaban, SCDs, TEDs PO pain control PT/OT Dispo: D/C home after clears therapy, today vs tomorrow   Randy Greene 08/11/2016, 7:52 AM   Rod Can, MD Cell [phone]

## 2016-08-11 NOTE — Plan of Care (Signed)
Problem: Activity: Goal: Risk for activity intolerance will decrease Outcome: Progressing OOB to bedside , tolerated well  Problem: Bowel/Gastric: Goal: Will not experience complications related to bowel motility Outcome: Progressing Was nauseated once, medicated with Zofran with full relief  Problem: Activity: Goal: Ability to avoid complications of mobility impairment will improve Outcome: Progressing No complications noted  Problem: Education: Goal: Knowledge of the prescribed therapeutic regimen will improve Outcome: Progressing SCDs on  Problem: Pain Management: Goal: Pain level will decrease with appropriate interventions Outcome: Progressing Medicated twice for pain with moderate relief

## 2016-08-11 NOTE — Evaluation (Signed)
Occupational Therapy Evaluation and Discharge Patient Details Name: Randy Greene MRN: [id number] DOB: 1966/05/27 Today's Date: 08/11/2016    History of Present Illness 51 y.o. male s/p Lt TKA. PMH: MS, HTN, asthma.    Clinical Impression   PTA Pt independent in ADL and using a SPC and scooter for mobility. Pt currently min A for ADL and supervision for mobility with RW. Compensatory strategies presented for Pt and sister, and Pt able to demonstrate safe transfers and sink level grooming. Pt reports set up level for dressing. OT education complete. Pt and sister with no questions or concerns at end of session. OT to sign off at this point. Thank you for this referral.     Follow Up Recommendations  No OT follow up;Supervision - Intermittent    Equipment Recommendations  3 in 1 bedside commode ((Pt had orders from CM))    Recommendations for Other Services       Precautions / Restrictions Precautions Precautions: Knee Precaution Comments: reviewed knee extension precautions Restrictions Weight Bearing Restrictions: Yes LLE Weight Bearing: Weight bearing as tolerated      Mobility Bed Mobility               General bed mobility comments: up in chair upon arrival  Transfers Overall transfer level: Needs assistance Equipment used: Rolling walker (2 wheeled) Transfers: Sit to/from Stand Sit to Stand: Min guard         General transfer comment: good hand placement, guard for safety    Balance Overall balance assessment: Needs assistance Sitting-balance support: No upper extremity supported Sitting balance-Leahy Scale: Good     Standing balance support: Bilateral upper extremity supported;During functional activity Standing balance-Leahy Scale: Fair Standing balance comment: using rw for ambulation                            ADL Overall ADL's : Needs assistance/impaired     Grooming: Min guard;Standing   Upper Body Bathing: Set up;Sitting    Lower Body Bathing: Min guard;With caregiver independent assisting;With adaptive equipment;Sit to/from stand   Upper Body Dressing : Set up   Lower Body Dressing: Set up;With caregiver independent assisting;Sit to/from stand Lower Body Dressing Details (indicate cue type and reason): Pt reports that his sister gave him clothes and he was able to dress underwear, shorts with no problem. Pt educated to dress operated leg first Toilet Transfer: Supervision/safety;BSC;RW;Ambulation       Tub/ Banker: Tub transfer;Supervision/safety;Tub bench;Rolling walker   Functional mobility during ADLs: Rolling walker;Supervision/safety General ADL Comments: Pt's sister willing and able to assist with LB bathing/dressing as needed. Pt currently set up level for both     Vision Baseline Vision/History: Wears glasses Wears Glasses: At all times Patient Visual Report: No change from baseline Vision Assessment?: No apparent visual deficits     Perception     Praxis      Pertinent Vitals/Pain Pain Assessment: 0-10 Pain Score: 4  Pain Location: Lt knee Pain Descriptors / Indicators: Aching Pain Intervention(s): Limited activity within patient's tolerance;Monitored during session     Hand Dominance     Extremity/Trunk Assessment Upper Extremity Assessment Upper Extremity Assessment: Overall WFL for tasks assessed   Lower Extremity Assessment Lower Extremity Assessment: LLE deficits/detail LLE Deficits / Details: post op deficits in strength and ROM       Communication Communication Communication: No difficulties   Cognition Arousal/Alertness: Awake/alert Behavior During Therapy: WFL for tasks assessed/performed Overall Cognitive Status:  Within Functional Limits for tasks assessed                     General Comments  Pt's sister in room for session    Exercises       Shoulder Instructions      Home Living Family/patient expects to be discharged to:: Private  residence Living Arrangements: Alone Available Help at Discharge: Family;Available 24 hours/day Type of Home: Apartment Home Access: Level entry     Home Layout: One level     Bathroom Shower/Tub: Tub/shower unit Shower/tub characteristics: Architectural technologist: Standard Bathroom Accessibility: Yes How Accessible: Accessible via wheelchair Home Equipment: Cane - single point;Electric scooter;Other (comment);Tub bench (handicapped Lucianne Lei)   Additional Comments: Sister going to stay with pt following D/C. Pt lives in handicap modified apartment.       Prior Functioning/Environment Level of Independence: Independent with assistive device(s)        Comments: using SPC in the home and electric scooter for community distances.         OT Problem List: Decreased strength;Decreased range of motion;Decreased activity tolerance;Impaired balance (sitting and/or standing);Decreased knowledge of use of DME or AE;Pain      OT Treatment/Interventions:      OT Goals(Current goals can be found in the care plan section) Acute Rehab OT Goals Patient Stated Goal: home OT Goal Formulation: With patient/family Time For Goal Achievement: 08/18/16 Potential to Achieve Goals: Good  OT Frequency:     Barriers to D/C:            Co-evaluation              End of Session Equipment Utilized During Treatment: Rolling walker Nurse Communication: Mobility status;Weight bearing status (OT complete)  Activity Tolerance: Patient tolerated treatment well Patient left: in chair;with call bell/phone within reach;with family/visitor present  OT Visit Diagnosis: Other abnormalities of gait and mobility (R26.89)                ADL either performed or assessed with clinical judgement  Time: 1253-1314 OT Time Calculation (min): 21 min Charges:  OT General Charges $OT Visit: 1 Procedure OT Evaluation $OT Eval Moderate Complexity: 1 Procedure G-Codes:     Hulda Humphrey  OTR/L [place] 08/11/2016, 2:03 PM

## 2016-08-11 NOTE — Discharge Summary (Signed)
Physician Discharge Summary  Patient ID: Randy Greene MRN: [id number] DOB/AGE: April 20, 1966 51 y.o.  Admit date: 08/10/2016 Discharge date: 08/11/2016  Admission Diagnoses:  Osteoarthritis of left knee  Discharge Diagnoses:  Principal Problem:   Osteoarthritis of left knee Active Problems:   Degenerative arthritis of left knee   Past Medical History:  Diagnosis Date  . Acid reflux   . Aspiration pneumonia ([facility])   . Asthma   . Dyspnea   . Enlarged prostate   . Hypercapnic respiratory failure, chronic ([place])   . Hypertension   . MS (multiple sclerosis) ([facility])     Surgeries: Procedure(s): LEFT TOTAL KNEE ARTHROPLASTY WITH COMPUTER NAVIGATION on 08/10/2016   Consultants (if any):   Discharged Condition: Improved  Hospital Course: Randy Greene is an 51 y.o. male who was admitted 08/10/2016 with a diagnosis of Osteoarthritis of left knee and went to the operating room on 08/10/2016 and underwent the above named procedures.    He was given perioperative antibiotics:  Anti-infectives    Start     Dose/Rate Route Frequency Ordered Stop   08/10/16 2200  vancomycin (VANCOCIN) IVPB 1000 mg/200 mL premix     1,000 mg 200 mL/hr over 60 Minutes Intravenous Every 12 hours 08/10/16 1645 08/10/16 2250   08/10/16 1030  vancomycin (VANCOCIN) 1,500 mg in sodium chloride 0.9 % 500 mL IVPB     1,500 mg 250 mL/hr over 120 Minutes Intravenous On call to O.R. 08/07/16 1344 08/10/16 1302    .  He was given sequential compression devices, early ambulation, and apixaban for DVT prophylaxis.  He benefited maximally from the hospital stay and there were no complications.    Recent vital signs:  Vitals:   08/10/16 2150 08/11/16 0616  BP: 123/72 112/67  Pulse: 72 (!) 56  Resp: 16 16  Temp: 98.1 F (36.7 C) 97.8 F (36.6 C)    Recent laboratory studies:  Lab Results  Component Value Date   HGB 10.7 (L) 08/11/2016   HGB 13.3 08/03/2016   HGB 10.5 (L) 03/07/2014   Lab Results  Component  Value Date   WBC 8.5 08/11/2016   PLT 167 08/11/2016   Lab Results  Component Value Date   INR 1.15 03/04/2014   Lab Results  Component Value Date   NA 137 08/11/2016   K 3.7 08/11/2016   CL 105 08/11/2016   CO2 24 08/11/2016   BUN 6 08/11/2016   CREATININE 0.81 08/11/2016   GLUCOSE 104 (H) 08/11/2016    Discharge Medications:   Allergies as of 08/11/2016      Reactions   Food Nausea And Vomiting, Other (See Comments)   GREEN PEPPERS- flushed PT DOES NOT EAT MEAT   Morphine And Related Other (See Comments)   [facility]   Mushroom Extract Complex Nausea And Vomiting, Other (See Comments)   And flushed   Penicillins Hives, Other (See Comments)   FAMILY HISTORY   Zoloft [sertraline Hcl] Other (See Comments)   Per [facility] records   Adhesive [tape] Rash      Medication List    STOP taking these medications   acetaminophen 160 MG/5ML solution Commonly known as:  TYLENOL   diclofenac sodium 1 % Gel Commonly known as:  VOLTAREN   lidocaine 5 % Commonly known as:  LIDODERM     TAKE these medications   albuterol (2.5 MG/3ML) 0.083% nebulizer solution Commonly known as:  PROVENTIL Take 2.5 mg by nebulization 4 (four) times daily as needed for wheezing.  albuterol 108 (90 Base) MCG/ACT inhaler Commonly known as:  PROVENTIL HFA;VENTOLIN HFA Inhale 1 puff into the lungs 4 (four) times daily as needed for shortness of breath (cough).   amLODipine 10 MG tablet Commonly known as:  NORVASC Take 5 mg by mouth at bedtime.   AMPYRA 10 MG Tb12 Generic drug:  dalfampridine Take 10 mg by mouth every 12 (twelve) hours.   apixaban 2.5 MG Tabs tablet Commonly known as:  ELIQUIS Take 1 tablet (2.5 mg total) by mouth every 12 (twelve) hours.   atenolol 50 MG tablet Commonly known as:  TENORMIN Take 50 mg by mouth daily.   baclofen 10 MG tablet Commonly known as:  LIORESAL Take 15 mg by mouth 4 (four) times daily.   budesonide-formoterol 80-4.5 MCG/ACT inhaler Commonly  known as:  SYMBICORT Inhale 1 puff into the lungs 2 (two) times daily as needed (for shortness of breath).   cetirizine 10 MG tablet Commonly known as:  ZYRTEC Take 10 mg by mouth daily.   Cyanocobalamin 1000 MCG/ML Kit Inject 1,000 mcg as directed every 30 (thirty) days.   docusate sodium 100 MG capsule Commonly known as:  COLACE Take 1 capsule (100 mg total) by mouth 2 (two) times daily. Notes to patient:  Stool softner   donepezil 10 MG tablet Commonly known as:  ARICEPT Take 10 mg by mouth at bedtime.   doxazosin 4 MG tablet Commonly known as:  CARDURA Take 2 mg by mouth at bedtime. Do not take the same day with viagra, can cause heart attack by dropping blood pressure   dronabinol 2.5 MG capsule Commonly known as:  MARINOL Take 7.5 mg by mouth 3 (three) times daily.   HYDROcodone-acetaminophen 5-325 MG tablet Commonly known as:  NORCO/VICODIN Take 1-2 tablets by mouth every 4 (four) hours as needed (breakthrough pain). Notes to patient:  Narcotic for pain. Do not take and drive.    ipratropium-albuterol 0.5-2.5 (3) MG/3ML Soln Commonly known as:  DUONEB Take 3 mLs by nebulization every 4 (four) hours as needed (for shortness of breath).   lactobacillus acidophilus Tabs tablet Take 3 tablets by mouth 2 (two) times daily.   methylphenidate 20 MG tablet Commonly known as:  RITALIN Take 20 mg by mouth daily.   metoCLOPramide 10 MG tablet Commonly known as:  REGLAN Take 10 mg by mouth at bedtime.   montelukast 10 MG tablet Commonly known as:  SINGULAIR Take 10 mg by mouth every morning.   pantoprazole 40 MG tablet Commonly known as:  PROTONIX Take 40 mg by mouth 2 (two) times daily.   pregabalin 50 MG capsule Commonly known as:  LYRICA Take 150 mg by mouth 4 (four) times daily.   ranitidine 300 MG tablet Commonly known as:  ZANTAC Take 300 mg by mouth at bedtime.   senna 8.6 MG Tabs tablet Commonly known as:  SENOKOT Take 2 tablets (17.2 mg total) by  mouth at bedtime. Notes to patient:  Stool softener/laxative    sildenafil 100 MG tablet Commonly known as:  VIAGRA Take 100 mg by mouth daily as needed for erectile dysfunction.   tamsulosin 0.4 MG Caps capsule Commonly known as:  FLOMAX Take 0.4 mg by mouth at bedtime.   vitamin C 500 MG tablet Commonly known as:  ASCORBIC ACID Take 500 mg by mouth daily.   VITAMIN D PO Take 3 mLs by mouth daily.            Durable Medical Equipment  Start     Ordered   08/10/16 1646  DME 3 n 1  Once     08/10/16 1645   08/10/16 1646  DME Walker rolling  Once    Question:  Patient needs a walker to treat with the following condition  Answer:  S/P total knee replacement not using cement, left   08/10/16 1645      Diagnostic Studies: Dg Knee Left Port  Result Date: 08/10/2016 CLINICAL DATA:  Postoperative AP and lateral views of the left knee following joint replacement. EXAM: PORTABLE LEFT KNEE - 1-2 VIEW COMPARISON:  None in PACs FINDINGS: The patient has undergone total left knee joint prosthesis placement. Radiographic positioning of the prosthetic components is good. The interface with the native bone appears normal. There is air in fluid in the anterior aspect of the joint. IMPRESSION: No immediate postprocedure complication following left total knee joint prosthesis placement. Electronically Signed   By: David  Martinique M.D.   On: 08/10/2016 14:51    Disposition: 01-Home or Self Care  Discharge Instructions    Call MD / Call 911    Complete by:  As directed    If you experience chest pain or shortness of breath, CALL 911 and be transported to the hospital emergency room.  If you develope a fever above 101 F, pus (white drainage) or increased drainage or redness at the wound, or calf pain, call your surgeon's office.   Constipation Prevention    Complete by:  As directed    Drink plenty of fluids.  Prune juice may be helpful.  You may use a stool softener, such as Colace  (over the counter) 100 mg twice a day.  Use MiraLax (over the counter) for constipation as needed.   Diet - low sodium heart healthy    Complete by:  As directed    Do not put a pillow under the knee. Place it under the heel.    Complete by:  As directed    Driving restrictions    Complete by:  As directed    No driving for 4 weeks   Increase activity slowly as tolerated    Complete by:  As directed    Lifting restrictions    Complete by:  As directed    No lifting for 6 weeks   TED hose    Complete by:  As directed    Use stockings (TED hose) for 2 weeks on both leg(s).  You may remove them at night for sleeping.      Follow-up Information    [facility], Horald Pollen, MD. Schedule an appointment as soon as possible for a visit in 2 weeks.   Specialty:  Orthopedic Surgery Why:  For wound re-check Contact information: [address] [place] [address] [phone]            Signed: Elie Goody 08/11/2016, 3:22 PM

## 2016-11-21 NOTE — Addendum Note (Signed)
Addendum  created 11/21/16 0946 by Duane Boston, MD   Sign clinical note

## 2016-12-29 ENCOUNTER — Ambulatory Visit: Payer: Self-pay | Admitting: Orthopedic Surgery

## 2017-01-01 ENCOUNTER — Encounter (HOSPITAL_COMMUNITY): Payer: Self-pay | Admitting: *Deleted

## 2017-01-01 MED ORDER — CLINDAMYCIN PHOSPHATE 900 MG/50ML IV SOLN
900.0000 mg | INTRAVENOUS | Status: AC
  Administered 2017-01-04: 900 mg via INTRAVENOUS

## 2017-01-01 NOTE — Progress Notes (Signed)
Pt denies SOB, chest pain, and being under the care of a cardiologist. Pt denies having a cardiac cath but stated that a stress test was performed > 10 years ago. Pt stated that a chest x ray was performed prior to knee surgery at [facility]; records requested. Pt stated that he does not take Aspirin but was made aware to stop taking vitamins, fish oil, Saw [place]met[place] and herbal medications. Do not take any NSAIDs ie: Ibuprofen, Advil, Naproxen (Aleve), Motrin, BC and Goody Powder or any medication containing Aspirin. Pt verbalized understanding of all pre-op instructions.

## 2017-01-04 ENCOUNTER — Ambulatory Visit (HOSPITAL_COMMUNITY): Payer: Non-veteran care | Admitting: Certified Registered Nurse Anesthetist

## 2017-01-04 ENCOUNTER — Encounter (HOSPITAL_COMMUNITY)
Admission: RE | Disposition: A | Discharge status: Home / Self Care | Payer: Self-pay | Source: Ambulatory Visit | Attending: Orthopedic Surgery

## 2017-01-04 ENCOUNTER — Ambulatory Visit (HOSPITAL_COMMUNITY)
Admission: RE | Admit: 2017-01-04 | Discharge: 2017-01-04 | Disposition: A | Discharge status: Home / Self Care | Payer: Non-veteran care | Source: Ambulatory Visit | Attending: Orthopedic Surgery | Admitting: Orthopedic Surgery

## 2017-01-04 ENCOUNTER — Encounter (HOSPITAL_COMMUNITY): Payer: Self-pay | Admitting: Certified Registered Nurse Anesthetist

## 2017-01-04 DIAGNOSIS — Z9989 Dependence on other enabling machines and devices: Secondary | ICD-10-CM | POA: Diagnosis not present

## 2017-01-04 DIAGNOSIS — Z87891 Personal history of nicotine dependence: Secondary | ICD-10-CM | POA: Diagnosis not present

## 2017-01-04 DIAGNOSIS — Z79899 Other long term (current) drug therapy: Secondary | ICD-10-CM | POA: Insufficient documentation

## 2017-01-04 DIAGNOSIS — T8189XA Other complications of procedures, not elsewhere classified, initial encounter: Secondary | ICD-10-CM | POA: Insufficient documentation

## 2017-01-04 DIAGNOSIS — J45909 Unspecified asthma, uncomplicated: Secondary | ICD-10-CM | POA: Insufficient documentation

## 2017-01-04 DIAGNOSIS — G4733 Obstructive sleep apnea (adult) (pediatric): Secondary | ICD-10-CM | POA: Insufficient documentation

## 2017-01-04 DIAGNOSIS — Z91018 Allergy to other foods: Secondary | ICD-10-CM | POA: Insufficient documentation

## 2017-01-04 DIAGNOSIS — K219 Gastro-esophageal reflux disease without esophagitis: Secondary | ICD-10-CM | POA: Insufficient documentation

## 2017-01-04 DIAGNOSIS — Z885 Allergy status to narcotic agent status: Secondary | ICD-10-CM | POA: Insufficient documentation

## 2017-01-04 DIAGNOSIS — G35 Multiple sclerosis: Secondary | ICD-10-CM | POA: Diagnosis not present

## 2017-01-04 DIAGNOSIS — Z888 Allergy status to other drugs, medicaments and biological substances status: Secondary | ICD-10-CM | POA: Diagnosis not present

## 2017-01-04 DIAGNOSIS — Y838 Other surgical procedures as the cause of abnormal reaction of the patient, or of later complication, without mention of misadventure at the time of the procedure: Secondary | ICD-10-CM | POA: Insufficient documentation

## 2017-01-04 DIAGNOSIS — Z8249 Family history of ischemic heart disease and other diseases of the circulatory system: Secondary | ICD-10-CM | POA: Insufficient documentation

## 2017-01-04 DIAGNOSIS — Z880 Allergy status to penicillin: Secondary | ICD-10-CM | POA: Diagnosis not present

## 2017-01-04 DIAGNOSIS — Z7901 Long term (current) use of anticoagulants: Secondary | ICD-10-CM | POA: Diagnosis not present

## 2017-01-04 DIAGNOSIS — Z96652 Presence of left artificial knee joint: Secondary | ICD-10-CM | POA: Diagnosis not present

## 2017-01-04 DIAGNOSIS — I10 Essential (primary) hypertension: Secondary | ICD-10-CM | POA: Diagnosis not present

## 2017-01-04 DIAGNOSIS — N400 Enlarged prostate without lower urinary tract symptoms: Secondary | ICD-10-CM | POA: Insufficient documentation

## 2017-01-04 DIAGNOSIS — M1712 Unilateral primary osteoarthritis, left knee: Secondary | ICD-10-CM | POA: Diagnosis not present

## 2017-01-04 HISTORY — PX: INCISION AND DRAINAGE: SHX5863

## 2017-01-04 HISTORY — DX: Unspecified osteoarthritis, unspecified site: M19.90

## 2017-01-04 HISTORY — DX: Other injury of unspecified body region, subsequent encounter: T14.8XXD

## 2017-01-04 LAB — CBC
HEMATOCRIT: 40.6 % (ref 39.0–52.0)
Hemoglobin: 13.1 g/dL (ref 13.0–17.0)
MCH: 29.8 pg (ref 26.0–34.0)
MCHC: 32.3 g/dL (ref 30.0–36.0)
MCV: 92.3 fL (ref 78.0–100.0)
PLATELETS: 199 10*3/uL (ref 150–400)
RBC: 4.4 MIL/uL (ref 4.22–5.81)
RDW: 14.9 % (ref 11.5–15.5)
WBC: 6.8 10*3/uL (ref 4.0–10.5)

## 2017-01-04 LAB — BASIC METABOLIC PANEL
Anion gap: 11 (ref 5–15)
CO2: 24 mmol/L (ref 22–32)
Calcium: 9.4 mg/dL (ref 8.9–10.3)
Chloride: 103 mmol/L (ref 101–111)
Creatinine, Ser: 0.9 mg/dL (ref 0.61–1.24)
GFR calc Af Amer: >60 mL/min (ref >60)
Glucose, Bld: 99 mg/dL (ref 65–99)
Potassium: 3.8 mmol/L (ref 3.5–5.1)
SODIUM: 138 mmol/L (ref 135–145)

## 2017-01-04 LAB — PROTIME-INR
INR: 1.01
Prothrombin Time: 13.4 seconds (ref 11.4–15.2)

## 2017-01-04 LAB — APTT: APTT: 26 s (ref 24–36)

## 2017-01-04 SURGERY — INCISION AND DRAINAGE
Anesthesia: General | Site: Knee | Laterality: Left

## 2017-01-04 MED ORDER — EPHEDRINE SULFATE 50 MG/ML IJ SOLN
INTRAMUSCULAR | Status: DC | PRN
  Administered 2017-01-04 (×2): 10 mg via INTRAVENOUS

## 2017-01-04 MED ORDER — SUCCINYLCHOLINE CHLORIDE 20 MG/ML IJ SOLN
INTRAMUSCULAR | Status: DC | PRN
  Administered 2017-01-04: 140 mg via INTRAVENOUS

## 2017-01-04 MED ORDER — LACTATED RINGERS IV SOLN
INTRAVENOUS | Status: DC
  Administered 2017-01-04: 13:00:00 via INTRAVENOUS

## 2017-01-04 MED ORDER — CLINDAMYCIN PHOSPHATE 900 MG/50ML IV SOLN
INTRAVENOUS | Status: AC
  Filled 2017-01-04: qty 50

## 2017-01-04 MED ORDER — FENTANYL CITRATE (PF) 100 MCG/2ML IJ SOLN: 25.0000 ug | INTRAMUSCULAR | Status: DC | PRN

## 2017-01-04 MED ORDER — ONDANSETRON HCL 4 MG/2ML IJ SOLN
INTRAMUSCULAR | Status: DC | PRN
  Administered 2017-01-04: 4 mg via INTRAVENOUS

## 2017-01-04 MED ORDER — MIDAZOLAM HCL 2 MG/2ML IJ SOLN
INTRAMUSCULAR | Status: AC
  Filled 2017-01-04: qty 2

## 2017-01-04 MED ORDER — OXYCODONE HCL 5 MG PO TABS
5.0000 mg | ORAL_TABLET | ORAL | Status: DC | PRN
  Administered 2017-01-04: 10 mg via ORAL

## 2017-01-04 MED ORDER — MIDAZOLAM HCL 5 MG/5ML IJ SOLN
INTRAMUSCULAR | Status: DC | PRN
  Administered 2017-01-04: 2 mg via INTRAVENOUS

## 2017-01-04 MED ORDER — METHOCARBAMOL 500 MG PO TABS
500.0000 mg | ORAL_TABLET | Freq: Four times a day (QID) | ORAL | Status: DC | PRN
  Administered 2017-01-04: 500 mg via ORAL

## 2017-01-04 MED ORDER — LIDOCAINE HCL (CARDIAC) 20 MG/ML IV SOLN
INTRAVENOUS | Status: DC | PRN
  Administered 2017-01-04: 60 mg via INTRAVENOUS

## 2017-01-04 MED ORDER — ESMOLOL HCL 100 MG/10ML IV SOLN
INTRAVENOUS | Status: DC | PRN
  Administered 2017-01-04: 30 mg via INTRAVENOUS

## 2017-01-04 MED ORDER — DEXAMETHASONE SODIUM PHOSPHATE 10 MG/ML IJ SOLN
INTRAMUSCULAR | Status: DC | PRN
  Administered 2017-01-04: 10 mg via INTRAVENOUS

## 2017-01-04 MED ORDER — ONDANSETRON HCL 4 MG/2ML IJ SOLN: 4.0000 mg | Freq: Once | INTRAMUSCULAR | Status: DC | PRN

## 2017-01-04 MED ORDER — PROPOFOL 10 MG/ML IV BOLUS
INTRAVENOUS | Status: DC | PRN
  Administered 2017-01-04: 200 mg via INTRAVENOUS

## 2017-01-04 MED ORDER — CHLORHEXIDINE GLUCONATE 4 % EX LIQD: 60.0000 mL | Freq: Once | CUTANEOUS | Status: DC

## 2017-01-04 MED ORDER — METHOCARBAMOL 1000 MG/10ML IJ SOLN: 500.0000 mg | Freq: Four times a day (QID) | INTRAVENOUS | Status: DC | PRN

## 2017-01-04 MED ORDER — GLYCOPYRROLATE 0.2 MG/ML IJ SOLN
INTRAMUSCULAR | Status: DC | PRN
  Administered 2017-01-04: 0.2 mg via INTRAVENOUS

## 2017-01-04 MED ORDER — METHOCARBAMOL 500 MG PO TABS
ORAL_TABLET | ORAL | Status: AC
  Administered 2017-01-04: 500 mg via ORAL
  Filled 2017-01-04: qty 1

## 2017-01-04 MED ORDER — OXYCODONE HCL 5 MG PO TABS
ORAL_TABLET | ORAL | Status: AC
  Administered 2017-01-04: 10 mg via ORAL
  Filled 2017-01-04: qty 2

## 2017-01-04 MED ORDER — FENTANYL CITRATE (PF) 100 MCG/2ML IJ SOLN
INTRAMUSCULAR | Status: DC | PRN
  Administered 2017-01-04 (×2): 50 ug via INTRAVENOUS
  Administered 2017-01-04: 150 ug via INTRAVENOUS

## 2017-01-04 MED ORDER — MEPERIDINE HCL 25 MG/ML IJ SOLN: 6.2500 mg | INTRAMUSCULAR | Status: DC | PRN

## 2017-01-04 MED ORDER — SODIUM CHLORIDE 0.9 % IR SOLN
Status: DC | PRN
  Administered 2017-01-04: 3000 mL

## 2017-01-04 MED ORDER — FENTANYL CITRATE (PF) 250 MCG/5ML IJ SOLN
INTRAMUSCULAR | Status: AC
  Filled 2017-01-04: qty 5

## 2017-01-04 MED ORDER — PROPOFOL 10 MG/ML IV BOLUS
INTRAVENOUS | Status: AC
  Filled 2017-01-04: qty 20

## 2017-01-04 SURGICAL SUPPLY — 30 items
BANDAGE ESMARK 6X9 LF (GAUZE/BANDAGES/DRESSINGS) ×1 IMPLANT
BNDG ESMARK 6X9 LF (GAUZE/BANDAGES/DRESSINGS) ×2
BNDG GAUZE ELAST 4 BULKY (GAUZE/BANDAGES/DRESSINGS) ×2 IMPLANT
COVER SURGICAL LIGHT HANDLE (MISCELLANEOUS) ×2 IMPLANT
CUFF TOURNIQUET SINGLE 18IN (TOURNIQUET CUFF) IMPLANT
CUFF TOURNIQUET SINGLE 34IN LL (TOURNIQUET CUFF) ×2 IMPLANT
DRSG AQUACEL AG ADV 3.5X 6 (GAUZE/BANDAGES/DRESSINGS) ×2 IMPLANT
DRSG PAD ABDOMINAL 8X10 ST (GAUZE/BANDAGES/DRESSINGS) ×2 IMPLANT
DURAPREP 26ML APPLICATOR (WOUND CARE) ×2 IMPLANT
GAUZE SPONGE 4X4 12PLY STRL (GAUZE/BANDAGES/DRESSINGS) ×2 IMPLANT
GLOVE BIOGEL M 7.0 STRL (GLOVE) IMPLANT
GLOVE BIOGEL M STRL SZ7.5 (GLOVE) ×2 IMPLANT
GLOVE BIOGEL PI IND STRL 7.5 (GLOVE) ×1 IMPLANT
GLOVE BIOGEL PI IND STRL 8.5 (GLOVE) ×1 IMPLANT
GLOVE BIOGEL PI INDICATOR 7.5 (GLOVE) ×1
GLOVE BIOGEL PI INDICATOR 8.5 (GLOVE) ×1
GLOVE ECLIPSE 8.0 STRL XLNG CF (GLOVE) ×2 IMPLANT
GLOVE ORTHO TXT STRL SZ7.5 (GLOVE) ×4 IMPLANT
GLOVE SURG ORTHO 8.0 STRL STRW (GLOVE) ×2 IMPLANT
GOWN STRL REUS W/TWL LRG LVL3 (GOWN DISPOSABLE) ×2 IMPLANT
GOWN STRL REUS W/TWL XL LVL3 (GOWN DISPOSABLE) ×4 IMPLANT
HANDPIECE INTERPULSE COAX TIP (DISPOSABLE) ×1
KIT BASIN OR (CUSTOM PROCEDURE TRAY) ×2 IMPLANT
MANIFOLD NEPTUNE II (INSTRUMENTS) ×2 IMPLANT
PACK ORTHO EXTREMITY (CUSTOM PROCEDURE TRAY) ×2 IMPLANT
PAD CAST 4YDX4 CTTN HI CHSV (CAST SUPPLIES) ×1 IMPLANT
PADDING CAST COTTON 4X4 STRL (CAST SUPPLIES) ×1
SET HNDPC FAN SPRY TIP SCT (DISPOSABLE) ×1 IMPLANT
SYR CONTROL 10ML LL (SYRINGE) ×2 IMPLANT
TOWEL OR 17X26 10 PK STRL BLUE (TOWEL DISPOSABLE) ×4 IMPLANT

## 2017-01-04 NOTE — Anesthesia Preprocedure Evaluation (Addendum)
Anesthesia Evaluation  Patient identified by MRN, date of birth, ID band Patient awake    Reviewed: Allergy & Precautions, NPO status , Patient's Chart, lab work & pertinent test results  Airway Mallampati: II  TM Distance: >3 FB Neck ROM: Full    Dental no notable dental hx.    Pulmonary neg pulmonary ROS, former smoker,    Pulmonary exam normal breath sounds clear to auscultation       Cardiovascular hypertension, negative cardio ROS Normal cardiovascular exam Rhythm:Regular Rate:Normal     Neuro/Psych negative psych ROS   GI/Hepatic negative GI ROS, Neg liver ROS,   Endo/Other  negative endocrine ROS  Renal/GU negative Renal ROS  negative genitourinary   Musculoskeletal negative musculoskeletal ROS (+)   Abdominal   Peds negative pediatric ROS (+)  Hematology negative hematology ROS (+)   Anesthesia Other Findings Transaminitis Acute encephalopathy MS (multiple sclerosis) (HCC)  Hypoxia Chronic hypercapnic respiratory failure ([facility]CC) Acid reflux Recurrent aspiration bronchitis/pneumonia ([facility]) Metabolic alkalosis with chronic respiratory acidosis OSA on CPAP Agitation Osteoarthritis of left knee Degenerative arthritis of left knee    Reproductive/Obstetrics negative OB ROS                                                            Anesthesia Evaluation  Patient identified by MRN, date of birth, ID band Patient awake    Reviewed: Allergy & Precautions, H&P , NPO status , Patient's Chart, lab work & pertinent test results, reviewed documented beta blocker date and time   Airway Mallampati: II  TM Distance: >3 FB Neck ROM: Full    Dental no notable dental hx. (+) Teeth Intact, Dental Advisory Given   Pulmonary asthma , former smoker,    Pulmonary exam normal breath sounds clear to auscultation       Cardiovascular hypertension, Pt. on medications and Pt. on  home beta blockers  Rhythm:Regular Rate:Normal     Neuro/Psych MS    GI/Hepatic Neg liver ROS, GERD  Medicated and Poorly Controlled,  Endo/Other  negative endocrine ROS  Renal/GU negative Renal ROS  negative genitourinary   Musculoskeletal   Abdominal   Peds  Hematology negative hematology ROS (+)   Anesthesia Other Findings   Reproductive/Obstetrics negative OB ROS                           Anesthesia Physical Anesthesia Plan  ASA: III  Anesthesia Plan: General   Post-op Pain Management:  Regional for Post-op pain   Induction: Intravenous, Rapid sequence and Cricoid pressure planned  Airway Management Planned: Oral ETT  Additional Equipment:   Intra-op Plan:   Post-operative Plan: Extubation in OR  Informed Consent: I have reviewed the patients History and Physical, chart, labs and discussed the procedure including the risks, benefits and alternatives for the proposed anesthesia with the patient or authorized representative who has indicated his/her understanding and acceptance.   Dental advisory given  Plan Discussed with: CRNA  Anesthesia Plan Comments:        Anesthesia Quick Evaluation                                   Anesthesia Evaluation  Patient identified by  MRN, date of birth, ID band Patient awake    Reviewed: Allergy & Precautions, H&P , NPO status , Patient's Chart, lab work & pertinent test results, reviewed documented beta blocker date and time   Airway Mallampati: II  TM Distance: >3 FB Neck ROM: Full    Dental no notable dental hx. (+) Teeth Intact, Dental Advisory Given   Pulmonary asthma , former smoker,    Pulmonary exam normal breath sounds clear to auscultation       Cardiovascular hypertension, Pt. on medications and Pt. on home beta blockers  Rhythm:Regular Rate:Normal     Neuro/Psych MS    GI/Hepatic Neg liver ROS, GERD  Medicated and Poorly Controlled,  Endo/Other   negative endocrine ROS  Renal/GU negative Renal ROS  negative genitourinary   Musculoskeletal   Abdominal   Peds  Hematology negative hematology ROS (+)   Anesthesia Other Findings   Reproductive/Obstetrics negative OB ROS                           Anesthesia Physical Anesthesia Plan  ASA: III  Anesthesia Plan: General   Post-op Pain Management:  Regional for Post-op pain   Induction: Intravenous, Rapid sequence and Cricoid pressure planned  Airway Management Planned: Oral ETT  Additional Equipment:   Intra-op Plan:   Post-operative Plan: Extubation in OR  Informed Consent: I have reviewed the patients History and Physical, chart, labs and discussed the procedure including the risks, benefits and alternatives for the proposed anesthesia with the patient or authorized representative who has indicated his/her understanding and acceptance.   Dental advisory given  Plan Discussed with: CRNA  Anesthesia Plan Comments:        Anesthesia Quick Evaluation  Anesthesia Physical Anesthesia Plan  ASA: II  Anesthesia Plan: General   Post-op Pain Management:    Induction: Intravenous  PONV Risk Score and Plan: 2 and Ondansetron and Dexamethasone  Airway Management Planned: LMA  Additional Equipment:   Intra-op Plan:   Post-operative Plan:   Informed Consent:   Plan Discussed with: CRNA and Surgeon  Anesthesia Plan Comments: ( )        Anesthesia Quick Evaluation                                   Anesthesia Evaluation  Patient identified by MRN, date of birth, ID band Patient awake    Reviewed: Allergy & Precautions, H&P , NPO status , Patient's Chart, lab work & pertinent test results, reviewed documented beta blocker date and time   Airway Mallampati: II  TM Distance: >3 FB Neck ROM: Full    Dental no notable dental hx. (+) Teeth Intact, Dental Advisory Given   Pulmonary asthma , former smoker,     Pulmonary exam normal breath sounds clear to auscultation       Cardiovascular hypertension, Pt. on medications and Pt. on home beta blockers  Rhythm:Regular Rate:Normal     Neuro/Psych MS    GI/Hepatic Neg liver ROS, GERD  Medicated and Poorly Controlled,  Endo/Other  negative endocrine ROS  Renal/GU negative Renal ROS  negative genitourinary   Musculoskeletal   Abdominal   Peds  Hematology negative hematology ROS (+)   Anesthesia Other Findings   Reproductive/Obstetrics negative OB ROS  Anesthesia Physical Anesthesia Plan  ASA: III  Anesthesia Plan: General   Post-op Pain Management:  Regional for Post-op pain   Induction: Intravenous, Rapid sequence and Cricoid pressure planned  Airway Management Planned: Oral ETT  Additional Equipment:   Intra-op Plan:   Post-operative Plan: Extubation in OR  Informed Consent: I have reviewed the patients History and Physical, chart, labs and discussed the procedure including the risks, benefits and alternatives for the proposed anesthesia with the patient or authorized representative who has indicated his/her understanding and acceptance.   Dental advisory given  Plan Discussed with: CRNA  Anesthesia Plan Comments:        Anesthesia Quick Evaluation

## 2017-01-04 NOTE — Progress Notes (Signed)
Patient states when he takes narcotics it causes him to have an increase risk for aspiration and was told to only take tramadol. Patient states he is to sit up until medication wears off and he has a wedge at the house he uses.  Patient stable no complaints at this time.

## 2017-01-04 NOTE — Discharge Instructions (Signed)
Your dressing is waterproof; you may take showers upon discharge. Do not remove the dressing. Do not soak or submerge the dressing.

## 2017-01-04 NOTE — Transfer of Care (Signed)
Immediate Anesthesia Transfer of Care Note  Patient: Randy Greene  Procedure(s) Performed: Procedure(s): INCISION AND DRAINAGE LEFT KNEE (Left)  Patient Location: PACU  Anesthesia Type:General  Level of Consciousness: awake, alert , oriented and patient cooperative  Airway & Oxygen Therapy: Patient Spontanous Breathing  Post-op Assessment: Report given to RN and Post -op Vital signs reviewed and stable  Post vital signs: Reviewed and stable  Last Vitals:  Vitals:   01/04/17 1538 01/04/17 1539  BP: 117/90   Pulse:  (!) 121  Resp:  14  Temp:  36.4 C    Last Pain:  Vitals:   01/04/17 1226  TempSrc: Oral  PainSc: 5       Patients Stated Pain Goal: 4 (74/12/87 [id number])  Complications: No apparent anesthesia complications

## 2017-01-04 NOTE — Anesthesia Procedure Notes (Signed)
Procedure Name: Intubation Date/Time: 01/04/2017 2:43 PM Performed by: Lance Coon Pre-anesthesia Checklist: Patient identified, Emergency Drugs available, Suction available, Patient being monitored and Timeout performed Patient Re-evaluated:Patient Re-evaluated prior to induction Oxygen Delivery Method: Circle system utilized Preoxygenation: Pre-oxygenation with 100% oxygen Induction Type: IV induction, Rapid sequence and Cricoid Pressure applied Laryngoscope Size: Miller and 3 Grade View: Grade I Tube type: Oral Tube size: 7.5 mm Number of attempts: 1 Airway Equipment and Method: Stylet Placement Confirmation: ETT inserted through vocal cords under direct vision,  positive ETCO2 and breath sounds checked- equal and bilateral Secured at: 23 cm Tube secured with: Tape Dental Injury: Teeth and Oropharynx as per pre-operative assessment

## 2017-01-04 NOTE — Brief Op Note (Signed)
01/04/2017  3:27 PM  PATIENT:  Randy Greene  51 y.o. male  PRE-OPERATIVE DIAGNOSIS:  Delayed wound healing left knee  POST-OPERATIVE DIAGNOSIS:  Delayed wound healing left knee  PROCEDURE:  Procedure(s): INCISION AND DRAINAGE LEFT KNEE (Left)  SURGEON:  Surgeon(s) and Role:    * Raedyn Wenke, Aaron Edelman, MD - Primary  PHYSICIAN ASSISTANT: none  ASSISTANTS: justin queen, rnfa   ANESTHESIA:   general  EBL:  Total I/O In: -  Out: 5 [Blood:5]  BLOOD ADMINISTERED:none  DRAINS: none   LOCAL MEDICATIONS USED:  NONE  SPECIMEN:  No Specimen  DISPOSITION OF SPECIMEN:  PATHOLOGY  COUNTS:  YES  TOURNIQUET:    DICTATION: .Other Dictation: Dictation Number [id number]  PLAN OF CARE: Discharge to home after PACU  PATIENT DISPOSITION:  PACU - hemodynamically stable.   Delay start of Pharmacological VTE agent (>24hrs) due to surgical blood loss or risk of bleeding: not applicable

## 2017-01-04 NOTE — Anesthesia Postprocedure Evaluation (Signed)
Anesthesia Post Note  Patient: Derrion Reitter  Procedure(s) Performed: Procedure(s) (LRB): INCISION AND DRAINAGE LEFT KNEE (Left)     Patient location during evaluation: PACU Anesthesia Type: General Level of consciousness: awake and alert Pain management: pain level controlled Vital Signs Assessment: post-procedure vital signs reviewed and stable Respiratory status: spontaneous breathing, nonlabored ventilation, respiratory function stable and patient connected to nasal cannula oxygen Cardiovascular status: blood pressure returned to baseline and stable Postop Assessment: no signs of nausea or vomiting Anesthetic complications: no    Last Vitals:  Vitals:   01/04/17 1545 01/04/17 1553  BP:  123/82  Pulse: 87 95  Resp: (!) 9 14  Temp:      Last Pain:  Vitals:   01/04/17 1545  TempSrc:   PainSc: 5     LLE Motor Response: Purposeful movement;Responds to commands (01/04/17 1545) LLE Sensation: Full sensation (01/04/17 1545)          Burnice Vassel

## 2017-01-04 NOTE — H&P (Signed)
PREOPERATIVE H&P  Chief Complaint: Delayed wound healing left knee  HPI: Randy Greene is a 51 y.o. male who presents for preoperative history and physical with a diagnosis of Delayed wound healing left knee. We discussed the risks, benefits, and alternatives to debridement and closure of the wound. He has elected for surgical management.   Past Medical History:  Diagnosis Date  . Acid reflux   . Arthritis   . Aspiration pneumonia ([place])   . Asthma   . Delayed wound healing    left knee  . Dyspnea   . Enlarged prostate   . Hypercapnic respiratory failure, chronic ([place])   . Hypertension   . MS (multiple sclerosis) ([place])    Past Surgical History:  Procedure Laterality Date  . ARTHROSCOPIC    . HERNIA REPAIR    . jawsurgery    . KNEE ARTHROPLASTY Left 08/10/2016   Procedure: LEFT TOTAL KNEE ARTHROPLASTY WITH COMPUTER NAVIGATION;  Surgeon: Rod Can, MD;  Location: [facility];  Service: Orthopedics;  Laterality: Left;  . knees scope     Social History   Social History  . Marital status: Single    Spouse name: N/A  . Number of children: N/A  . Years of education: N/A   Social History Main Topics  . Smoking status: Former Smoker    Packs/day: 1.00    Years: 20.00    Types: Cigarettes    Quit date: 11/02/2006  . Smokeless tobacco: Never Used  . Alcohol use No  . Drug use: No     Comment: medical marijuana  . Sexual activity: No   Other Topics Concern  . None   Social History Narrative  . None   Family History  Problem Relation Age of Onset  . Dementia Mother   . Hypertension Father   . Heart attack Father    Allergies  Allergen Reactions  . Penicillins Hives and Other (See Comments)    [ +FAMILY HISTORY] Has patient had a PCN reaction causing immediate rash, facial/tongue/throat swelling, SOB or lightheadedness with hypotension: Unknown Has patient had a PCN reaction causing severe rash involving mucus membranes or skin necrosis: Unknown Has patient had a PCN  reaction that required hospitalization: No Has patient had a PCN reaction occurring within the last 10 years: No If all of the above answers are "NO", then may proceed with Cephalosporin use.    . Food Nausea And Vomiting and Other (See Comments)    GREEN PEPPERS- flushed  PT DOES NOT EAT MEAT  . Mushroom Extract Complex Nausea And Vomiting and Other (See Comments)    And flushed  . Adhesive [Tape] Rash  . Morphine And Related Itching   Prior to Admission medications   Medication Sig Start Date End Date Taking? Authorizing Provider  albuterol (PROVENTIL HFA;VENTOLIN HFA) 108 (90 BASE) MCG/ACT inhaler Inhale 2 puffs into the lungs 2 (two) times daily.    Yes [provider]  albuterol (PROVENTIL) (2.5 MG/3ML) 0.083% nebulizer solution Take 2.5 mg by nebulization 4 (four) times daily as needed for wheezing.   Yes [provider]  amLODipine (NORVASC) 10 MG tablet Take 5 mg by mouth at bedtime.   Yes [provider]  atenolol (TENORMIN) 50 MG tablet Take 50 mg by mouth daily.    Yes [provider]  baclofen (LIORESAL) 10 MG tablet Take 15 mg by mouth 4 (four) times daily.    Yes [provider]  budesonide-formoterol (SYMBICORT) 80-4.5 MCG/ACT inhaler Inhale 1 puff into  the lungs 2 (two) times daily as needed (for shortness of breath).   Yes [provider]  Cholecalciferol (VITAMIN D PO) Take 3 mLs by mouth daily.   Yes [provider]  Cyanocobalamin 1000 MCG/ML KIT Inject 1,000 mcg as directed every 30 (thirty) days.   Yes [provider]  dalfampridine (AMPYRA) 10 MG TB12 Take 10 mg by mouth every 12 (twelve) hours.    Yes [provider]  donepezil (ARICEPT) 10 MG tablet Take 10 mg by mouth at bedtime.    Yes [provider]  doxazosin (CARDURA) 4 MG tablet Take 2 mg by mouth at bedtime. Do not take the same day with viagra, can cause heart attack by dropping blood pressure   Yes [provider]  dronabinol (MARINOL) 2.5 MG capsule Take 7.5 mg by mouth 3 (three) times daily.    Yes [provider]  ipratropium-albuterol (DUONEB) 0.5-2.5 (3) MG/3ML SOLN Take 3 mLs by nebulization every 4 (four) hours as needed (for shortness of breath).   Yes [provider]  lactobacillus acidophilus (BACID) TABS Take 3 tablets by mouth 2 (two) times daily.    Yes [provider]  methylphenidate 36 MG PO CR tablet Take 36 mg by mouth daily.   Yes [provider]  metoCLOPramide (REGLAN) 10 MG tablet Take 10 mg by mouth at bedtime.   Yes [provider]  montelukast (SINGULAIR) 10 MG tablet Take 10 mg by mouth daily as needed (allergies).    Yes [provider]  pantoprazole (PROTONIX) 40 MG tablet Take 40 mg by mouth 2 (two) times daily.    Yes [provider]  pregabalin (LYRICA) 150 MG capsule Take 150 mg by mouth 3 (three) times daily.   Yes [provider]  ranitidine (ZANTAC) 300 MG tablet Take 300 mg by mouth at bedtime.    Yes [provider]  Saw Palmetto, Serenoa repens, (SAW PALMETTO PO) Take 1 tablet by mouth 2 (two) times daily.   Yes [provider]  sildenafil (VIAGRA) 100 MG tablet Take 100 mg by mouth daily as needed for erectile dysfunction.   Yes [provider]  tamsulosin (FLOMAX) 0.4 MG CAPS capsule Take 0.4 mg by mouth at bedtime.   Yes [provider]  vitamin C (ASCORBIC ACID) 500 MG tablet Take 500 mg by mouth daily.   Yes [provider]  apixaban (ELIQUIS) 2.5 MG TABS tablet Take 1 tablet (2.5 mg total) by mouth every 12 (twelve) hours. Patient not taking: Reported on 12/31/2016 08/11/16   Rod Can, MD  docusate sodium (COLACE) 100 MG capsule Take 1 capsule (100 mg total) by mouth 2 (two) times daily. Patient not taking: Reported on 12/31/2016 08/11/16   Rod Can, MD  HYDROcodone-acetaminophen (NORCO/VICODIN) 5-325 MG tablet Take 1-2  tablets by mouth every 4 (four) hours as needed (breakthrough pain). Patient not taking: Reported on 12/31/2016 08/11/16   Rod Can, MD  senna (SENOKOT) 8.6 MG TABS tablet Take 2 tablets (17.2 mg total) by mouth at bedtime. Patient not taking: Reported on 12/31/2016 08/11/16   Rod Can, MD     Positive ROS: All other systems have been reviewed and were otherwise negative with the exception of those mentioned in the HPI and as above.  Physical Exam: General: Alert, no acute distress Cardiovascular: No pedal edema Respiratory: No cyanosis, no use of accessory musculature GI: No organomegaly, abdomen is soft and non-tender Skin: No lesions in the area of chief  complaint Neurologic: Sensation intact distally Psychiatric: Patient is competent for consent with normal mood and affect Lymphatic: No axillary or cervical lymphadenopathy  MUSCULOSKELETAL: Examination of the left knee reveals that his incision is entirely healed except for an 8 mm area at the superior third of the incision. He has hyper-granulation tissue present. No periwound erythema or drainage. He is neurovascularly intact.  Assessment: Delayed wound healing left knee  Plan: Plan for Procedure(s): INCISION AND DRAINAGE LEFT KNEE  The risks benefits and alternatives were discussed with the patient including but not limited to the risks of nonoperative treatment, versus surgical intervention including infection, bleeding, nerve injury, blood clots, cardiopulmonary complications, morbidity, mortality, among others, and they were willing to proceed.   Kearah Gayden, Horald Pollen, MD Cell [phone]   01/04/2017 12:19 PM

## 2017-01-05 ENCOUNTER — Encounter (HOSPITAL_COMMUNITY): Payer: Self-pay | Admitting: Orthopedic Surgery

## 2017-01-05 NOTE — Op Note (Signed)
NAME:  Ladley, Keola                      ACCOUNT NO.:  MEDICAL RECORD NO.:  [id number]  LOCATION:                                 FACILITY:  PHYSICIAN:  Rod Can, MD     DATE OF BIRTH:  02-15-1966  DATE OF PROCEDURE:  01/04/2017 DATE OF DISCHARGE:                              OPERATIVE REPORT   SURGEON:  Rod Can, MD  ASSISTANT:  Ky Barban, RNFA.  PREOPERATIVE DIAGNOSIS:  Delayed wound healing, left knee.  POSTOPERATIVE DIAGNOSIS:  Delayed wound healing, left knee.  PROCEDURE PERFORMED:  Debridement of skin and subcutaneous tissue with primary wound closure, left knee.  ANTIBIOTICS:  900 mg of clindamycin.  TOURNIQUET TIME:  Not utilized.  SPECIMENS:  Left knee tissue for permanent pathology.  COMPLICATIONS:  None.  TUBES AND DRAINS:  None.  DISPOSITION:  Stable to PACU.  INDICATIONS:  The patient is a 51 year old male with multiple medical problems including multiple sclerosis.  The patient underwent primary left total knee replacement several months ago.  He had a small area at the superior aspect of the incision that was persistently not healing. He kept developing hypergranulation tissue followed by an eschar that would fall off.  This cycle would repeat.  He never had any erythema, drainage, or clinical evidence of infection.  We discussed the risks, benefits, alternatives to debridement and closure of the wound.  He elected to proceed.  DESCRIPTION OF PROCEDURE IN DETAIL:  The patient was identified in preop holding area using 2 identifiers.  I marked the surgical site, was taken to the operating room, placed supine on the operating table.  Tourniquet was applied, but never utilized.  Left lower extremity prepped and draped in normal sterile surgical fashion.  Time-out was called, verifying side and site of surgery.  Did receive IV antibiotics within 60 minutes at the beginning of the procedure.  I began by examining the knee, he had a small  approximately 8 mm area at the superior part of the incision where he had some hypergranulation tissue.  I used a #10 blade. I ellipsed this area out full thickness.  I then examined the wound bed. There was no communication with the deeper structures.  I used a rongeur to debride some of the local tissue in this area.  I used curved Metzenbaum scissors to develop some undermining of full-thickness skin flaps.  I then copiously irrigated the wound.  The wound was closed in layers with 2-0 Monocryl for the deep dermal layer and 2-0 nylon with vertical mattress sutures.  Sterile dressing was applied.  The patient was then extubated, taken to the PACU in stable condition.  Sponge, needle, and instrument counts were correct at the end of the case x2.  There were no known complications.          ______________________________ Rod Can, MD     BS/MEDQ  D:  01/04/2017  T:  01/04/2017  Job:  [id number]

## 2019-02-08 ENCOUNTER — Encounter: Payer: Self-pay | Admitting: Physical Medicine & Rehabilitation

## 2019-02-21 ENCOUNTER — Encounter: Payer: Self-pay | Admitting: Physical Medicine & Rehabilitation

## 2019-02-21 ENCOUNTER — Encounter
Payer: No Typology Code available for payment source | Attending: Physical Medicine & Rehabilitation | Admitting: Physical Medicine & Rehabilitation

## 2019-02-21 ENCOUNTER — Other Ambulatory Visit: Payer: Self-pay

## 2019-02-21 VITALS — BP 134/72 | HR 91 | Temp 97.5°F | Ht 74.0 in | Wt 215.0 lb

## 2019-02-21 DIAGNOSIS — Z5181 Encounter for therapeutic drug level monitoring: Secondary | ICD-10-CM

## 2019-02-21 DIAGNOSIS — M47817 Spondylosis without myelopathy or radiculopathy, lumbosacral region: Secondary | ICD-10-CM

## 2019-02-21 DIAGNOSIS — Z79891 Long term (current) use of opiate analgesic: Secondary | ICD-10-CM

## 2019-02-21 DIAGNOSIS — G894 Chronic pain syndrome: Secondary | ICD-10-CM | POA: Diagnosis not present

## 2019-02-21 NOTE — Patient Instructions (Signed)
We discussed pain related to lumbar spondylosis (arthritis) as well as sacroiliac pain  Sacroiliac Joint Dysfunction  Sacroiliac joint dysfunction is a condition that causes inflammation on one or both sides of the sacroiliac (SI) joint. The SI joint connects the lower part of the spine (sacrum) with the two upper portions of the pelvis (ilium). This condition causes deep aching or burning pain in the low back. In some cases, the pain may also spread into one or both buttocks, hips, or thighs. What are the causes? This condition may be caused by:  Pregnancy. During pregnancy, extra stress is put on the SI joints because the pelvis widens.  Injury, such as: ? Injuries from car accidents. ? Sports-related injuries. ? Work-related injuries.  Having one leg that is shorter than the other.  Conditions that affect the joints, such as: ? Rheumatoid arthritis. ? Gout. ? Psoriatic arthritis. ? Joint infection (septic arthritis). Sometimes, the cause of SI joint dysfunction is not known. What are the signs or symptoms? Symptoms of this condition include:  Aching or burning pain in the lower back. The pain may also spread to other areas, such as: ? Buttocks. ? Groin. ? Thighs.  Muscle spasms in or around the painful areas.  Increased pain when standing, walking, running, stair climbing, bending, or lifting. How is this diagnosed? This condition is diagnosed with a physical exam and medical history. During the exam, the health care provider may move one or both of your legs to different positions to check for pain. Various tests may be done to confirm the diagnosis, including:  Imaging tests to look for other causes of pain. These may include: ? MRI. ? CT scan. ? Bone scan.  Diagnostic injection. A numbing medicine is injected into the SI joint using a needle. If your pain is temporarily improved or stopped after the injection, this can indicate that SI joint dysfunction is the problem.  How is this treated? Treatment depends on the cause and severity of your condition. Treatment options may include:  Ice or heat applied to the lower back area after an injury. This may help reduce pain and muscle spasms.  Medicines to relieve pain or inflammation or to relax the muscles.  Wearing a back brace (sacroiliac brace) to help support the joint while your back is healing.  Physical therapy to increase muscle strength around the joint and flexibility at the joint. This may also involve learning proper body positions and ways of moving to relieve stress on the joint.  Direct manipulation of the SI joint.  Injections of steroid medicine into the joint to reduce pain and swelling.  Radiofrequency ablation to burn away nerves that are carrying pain messages from the joint.  Use of a device that provides electrical stimulation to help reduce pain at the joint.  Surgery to put in screws and plates that limit or prevent joint motion. This is rare. Follow these instructions at home: Medicines  Take over-the-counter and prescription medicines only as told by your health care provider.  Do not drive or use heavy machinery while taking prescription pain medicine.  If you are taking prescription pain medicine, take actions to prevent or treat constipation. Your health care provider may recommend that you: ? Drink enough fluid to keep your urine pale yellow. ? Eat foods that are high in fiber, such as fresh fruits and vegetables, whole grains, and beans. ? Limit foods that are high in fat and processed sugars, such as fried or sweet foods. ?  Take an over-the-counter or prescription medicine for constipation. If you have a brace:  Wear the brace as told by your health care provider. Remove it only as told by your health care provider.  Keep the brace clean.  If the brace is not waterproof: ? Do not let it get wet. ? Cover it with a watertight covering when you take a bath or a  shower. Managing pain, stiffness, and swelling      Icing can help with pain and swelling. Heat may help with muscle tension or spasms. Ask your health care provider if you should use ice or heat.  If directed, put ice on the affected area: ? If you have a removable brace, remove it as told by your health care provider. ? Put ice in a plastic bag. ? Place a towel between your skin and the bag. ? Leave the ice on for 20 minutes, 2-3 times a day.  If directed, apply heat to the affected area. Use the heat source that your health care provider recommends, such as a moist heat pack or a heating pad. ? Place a towel between your skin and the heat source. ? Leave the heat on for 20-30 minutes. ? Remove the heat if your skin turns bright red. This is especially important if you are unable to feel pain, heat, or cold. You may have a greater risk of getting burned. General instructions  Rest as needed. Ask your health care provider what activities are safe for you.  Return to your normal activities as told by your health care provider.  Exercise as directed by your health care provider or physical therapist.  Do not use any products that contain nicotine or tobacco, such as cigarettes and e-cigarettes. These can delay bone healing. If you need help quitting, ask your health care provider.  Keep all follow-up visits as told by your health care provider. This is important. Contact a health care provider if:  Your pain is not controlled with medicine.  You have a fever.  Your pain is getting worse. Get help right away if:  You have weakness, numbness, or tingling in your legs or feet.  You lose control of your bladder or bowel. Summary  Sacroiliac joint dysfunction is a condition that causes inflammation on one or both sides of the sacroiliac (SI) joint.  This condition causes deep aching or burning pain in the low back. In some cases, the pain may also spread into one or both  buttocks, hips, or thighs.  Treatment depends on the cause and severity of your condition. It may include medicines to reduce pain and swelling or to relax muscles. This information is not intended to replace advice given to you by your health care provider. Make sure you discuss any questions you have with your health care provider. Document Released: 09/04/2008 Document Revised: 02/02/2018 Document Reviewed: 07/19/2017 Elsevier Patient Education  2020 [employer].

## 2019-02-21 NOTE — Progress Notes (Signed)
Subjective:    Patient ID: Randy Greene, male    DOB: [birth year], 53 y.o.   MRN: [id number]  HPI 53 yo male referred by Physiatrist at [place], Dr Sidonie Dickens, to evaluate for spinal injections outside of [facility]  Past medical history is significant for multiple sclerosis with chronic left lower extremity weakness.  The patient's primary complaint is low back pain.  The majority of his pain is right at the belt line.  The patient has occasional pain in his thighs but really no significant pain below the knees. 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  He is also tried yoga as well as tai chi which also gives him partial relief.  The patient occasionally takes tramadol but this makes him drowsy.  In addition patient takes Lyrica 150 mg 3 times daily  The patient had a recent MRI performed last month and this was reviewed with the patient.  A spine model was utilized to help illustrate abnormalities. Pain Inventory Average Pain 8 Pain Right Now 7 My pain is sharp, stabbing and aching  In the last 24 hours, has pain interfered with the following? General activity 7 Relation with others 8 Enjoyment of life 9 What TIME of day is your pain at its worst? daytime and evening Sleep (in general) Fair  Pain is worse with: bending, inactivity and some activites Pain improves with: therapy/exercise and injections Relief from Meds: 5  Mobility use a cane how many minutes can you walk? 10 ability to climb steps?  yes do you drive?  yes Do you have any goals in this area?  yes  Function disabled: date disabled na I need assistance with the following:  household duties  Neuro/Psych weakness numbness tingling trouble walking spasms confusion  Prior Studies x-rays CT/MRI  Physicians involved in your care Primary care  . Neurologist . Orthopedist .   Family History  Problem Relation Age of Onset  . Dementia Mother   . Hypertension Father   . Heart attack Father    Social History   Socioeconomic History  . Marital status: Single    Spouse name: Not on file  . Number of children: Not on file  . Years of education: Not on file  . Highest education level: Not on file  Occupational History  . Not on file  Social Needs  . Financial resource strain: Not on file  . Food insecurity    Worry: Not on file    Inability: Not on file  . Transportation needs    Medical: Not on file    Non-medical: Not on file  Tobacco Use  . Smoking status: Former Smoker    Packs/day: 1.00    Years: 20.00    Pack years: 20.00    Types: Cigarettes    Quit date: 11/02/2006    Years since quitting: 12.3  . Smokeless tobacco: Never Used  Substance and Sexual Activity  . Alcohol use: No  . Drug use: No    Comment: medical marijuana  . Sexual activity: Never  Lifestyle  . Physical activity    Days per week: Not on file    Minutes per session: Not on file  . Stress: Not on file  Relationships  . Social Herbalist on phone: Not on file    Gets together: Not on file  Attends religious service: Not on file    Active member of club or organization: Not on file    Attends meetings of clubs or organizations: Not on file    Relationship status: Not on file  Other Topics Concern  . Not on file  Social History Narrative  . Not on file   Past Surgical History:  Procedure Laterality Date  . ARTHROSCOPIC    . HERNIA REPAIR    . INCISION AND DRAINAGE Left 01/04/2017   Procedure: INCISION AND DRAINAGE LEFT KNEE;  Surgeon: Rod Can, MD;  Location: [facility];  Service: Orthopedics;  Laterality: Left;  . jawsurgery    . KNEE ARTHROPLASTY Left 08/10/2016   Procedure: LEFT TOTAL KNEE ARTHROPLASTY WITH COMPUTER NAVIGATION;  Surgeon: Rod Can, MD;  Location: [facility];  Service: Orthopedics;  Laterality:  Left;  . knees scope     Past Medical History:  Diagnosis Date  . Acid reflux   . Arthritis   . Aspiration pneumonia ([place])   . Asthma   . Delayed wound healing    left knee  . Dyspnea   . Enlarged prostate   . Hypercapnic respiratory failure, chronic ([place])   . Hypertension   . MS (multiple sclerosis) ([facility])    BP 134/72   Pulse 91   Temp (!) 97.5 F (36.4 C)   Ht 6' 2"  (1.88 m)   Wt 215 lb (97.5 kg)   SpO2 93%   BMI 27.60 kg/m   Opioid Risk Score:   Fall Risk Score:  `1  Depression screen PHQ 2/9  Depression screen PHQ 2/9 02/21/2019  Decreased Interest 0  Down, Depressed, Hopeless 0  PHQ - 2 Score 0  Altered sleeping 0  Tired, decreased energy 2  Change in appetite 1  Feeling bad or failure about yourself  0  Trouble concentrating 2  Moving slowly or fidgety/restless 0  Suicidal thoughts 0  PHQ-9 Score 5  Difficult doing work/chores Somewhat difficult    Review of Systems  Constitutional: Negative.   HENT: Negative.   Eyes: Negative.   Respiratory: Negative.   Cardiovascular: Negative.   Gastrointestinal: Negative.   Endocrine: Negative.   Genitourinary: Negative.   Musculoskeletal: Positive for gait problem.  Skin: Negative.   Allergic/Immunologic: Negative.   Neurological: Positive for weakness and numbness. Negative for headaches.  Hematological: Negative.   Psychiatric/Behavioral: Positive for confusion.  All other systems reviewed and are negative.      Objective:   Physical Exam Vitals signs and nursing note reviewed.  Constitutional:      Appearance: Normal appearance.  HENT:     Head: Normocephalic and atraumatic.  Eyes:     Extraocular Movements: Extraocular movements intact.     Conjunctiva/sclera: Conjunctivae normal.     Pupils: Pupils are equal, round, and reactive to light.  Cardiovascular:     Rate and Rhythm: Normal rate and regular rhythm.     Heart sounds: Normal heart sounds. No murmur.  Pulmonary:     Effort: Pulmonary  effort is normal.     Breath sounds: Normal breath sounds.  Abdominal:     General: Abdomen is flat. Bowel sounds are normal.     Palpations: Abdomen is soft.  Skin:    General: Skin is warm and dry.  Neurological:     General: No focal deficit present.     Mental Status: He is alert and oriented to person, place, and time.     Comments: Motor strength is 5/5 bilateral  deltoid bicep tricep grip hip flexion extensor, 5/5 right and 4/5 left ankle dorsiflexor Negative straight leg raise Sensation normal bilateral C5 C6-C7-C8 L2-L3-L4 L5-S1 dermatome distribution  There is incoordination the left lower extremity.  There is Toe drag during ambulation.  Patient uses a cane outside the home.  Psychiatric:        Mood and Affect: Mood normal.        Behavior: Behavior normal.     Faber's is negative bilaterally      Assessment & Plan:  1.  Chronic axial low back pain no significant radicular component.  His left lower extremity weakness is secondary to his multiple sclerosis. We discussed the findings on his MRI and how they correlate to his examination Majority of his symptoms are at the belt line and at the L4 and L5 paraspinal area.  This seen combination with his radiologic studies indicate that the lumbar zygapophyseal joints L4-5 L5-S1 are likely pain generators.  We will set him up for bilateral L3-L4 medial branch and L5 dorsal ramus injections.  If this fails to provide at least 50% relief would then recommend sacroiliac injections. Discussed with patient agrees with plan. Would also recommend some back exercises once he is more comfortable with his pain.

## 2019-03-14 ENCOUNTER — Encounter: Payer: Self-pay | Admitting: Physical Medicine & Rehabilitation

## 2019-03-14 ENCOUNTER — Encounter (HOSPITAL_BASED_OUTPATIENT_CLINIC_OR_DEPARTMENT_OTHER): Payer: No Typology Code available for payment source | Admitting: Physical Medicine & Rehabilitation

## 2019-03-14 ENCOUNTER — Other Ambulatory Visit: Payer: Self-pay

## 2019-03-14 VITALS — BP 140/98 | HR 78 | Temp 98.0°F | Ht 74.0 in | Wt 212.0 lb

## 2019-03-14 DIAGNOSIS — M47817 Spondylosis without myelopathy or radiculopathy, lumbosacral region: Secondary | ICD-10-CM | POA: Diagnosis not present

## 2019-03-14 DIAGNOSIS — Z5181 Encounter for therapeutic drug level monitoring: Secondary | ICD-10-CM | POA: Diagnosis not present

## 2019-03-14 NOTE — Progress Notes (Signed)
Bilateral Lumbar L3, L4  medial branch blocks and L 5 dorsal ramus injection under fluoroscopic guidance  Indication: Lumbar pain which is not relieved by medication management or other conservative care and interfering with self-care and mobility.  Informed consent was obtained after describing risks and benefits of the procedure with the patient, this includes bleeding, infection, paralysis and medication side effects.  The patient wishes to proceed and has given written consent.  The patient was placed in prone position.  The lumbar area was marked and prepped with Betadine.  One mL of 1% lidocaine was injected into each of 6 areas into the skin and subcutaneous tissue.  Then a 22-gauge 3.5 inch spinal needle was inserted targeting the junction of the left S1 superior articular process and sacral ala junction. Needle was advanced under fluoroscopic guidance.  Bone contact was made.  Isovue 200 was injected x 0.5 mL demonstrating no intravascular uptake.  Then a solution  of 2% MPF lidocaine was injected x 0.5 mL.  Then the left L5 superior articular process in transverse process junction was targeted.  Bone contact was made.  Isovue 200 was injected x 0.5 mL demonstrating no intravascular uptake. Then a solution containing  2% MPF lidocaine was injected x 0.5 mL.  Then the left L4 superior articular process in transverse process junction was targeted.  Bone contact was made.  Isovue 200 was injected x 0.5 mL demonstrating no intravascular uptake.  Then a solution containing2% MPF lidocaine was injected x 0.5 mL.  This same procedure was performed on the right side using the same needle, technique and injectate.  Patient tolerated procedure well.  Post procedure instructions were given.  Preinjection 6/10 Post injection 0/10 on right , 3/10 on left

## 2019-03-14 NOTE — Progress Notes (Signed)
  PROCEDURE RECORD [name]   Name: Randy Greene DOB:1965-08-01 MRN: [id number]  Date:03/14/2019  Physician: Alysia Penna, MD    Nurse/CMA: Wrenn Willcox CMA / Truman Hayward CMA  Allergies:  Allergies  Allergen Reactions  . Penicillins Hives and Other (See Comments)    [ +FAMILY HISTORY] Has patient had a PCN reaction causing immediate rash, facial/tongue/throat swelling, SOB or lightheadedness with hypotension: Unknown Has patient had a PCN reaction causing severe rash involving mucus membranes or skin necrosis: Unknown Has patient had a PCN reaction that required hospitalization: No Has patient had a PCN reaction occurring within the last 10 years: No If all of the above answers are "NO", then may proceed with Cephalosporin use.    . Food Nausea And Vomiting and Other (See Comments)    GREEN PEPPERS- flushed  PT DOES NOT EAT MEAT  . Mushroom Extract Complex Nausea And Vomiting and Other (See Comments)    And flushed  . Adhesive [Tape] Rash  . Morphine And Related Itching    Consent Signed: Yes.    Is patient diabetic? No.  CBG today? NA  Pregnant: No. LMP: No LMP for male patient. (age 7-55)  Anticoagulants: no Anti-inflammatory: no Antibiotics: no  Procedure: Bilateral medial Branch Blocks L3-5   Position: Prone   Start Time: 11:46am End Time:11:56am Fluoro Time: 46  RN/CMA Kennetta Pavlovic CMA Lee CMA    Time 1101am 12:01pm    BP 140/98 135/84    Pulse 78 73    Respirations 16 16    O2 Sat 96 95    S/S 6 6    Pain Level 6/10 0/10     D/C home with Son, patient A & O X 3, D/C instructions reviewed, and sits independently.

## 2019-03-14 NOTE — Patient Instructions (Signed)

## 2019-04-11 ENCOUNTER — Encounter: Payer: No Typology Code available for payment source | Admitting: Physical Medicine & Rehabilitation

## 2019-04-27 ENCOUNTER — Other Ambulatory Visit: Payer: Self-pay

## 2019-04-27 ENCOUNTER — Encounter
Payer: No Typology Code available for payment source | Attending: Physical Medicine & Rehabilitation | Admitting: Physical Medicine & Rehabilitation

## 2019-04-27 ENCOUNTER — Encounter: Payer: Self-pay | Admitting: Physical Medicine & Rehabilitation

## 2019-04-27 VITALS — BP 149/89 | HR 93 | Temp 97.7°F | Ht 74.0 in | Wt 217.0 lb

## 2019-04-27 DIAGNOSIS — G894 Chronic pain syndrome: Secondary | ICD-10-CM | POA: Insufficient documentation

## 2019-04-27 DIAGNOSIS — Z5181 Encounter for therapeutic drug level monitoring: Secondary | ICD-10-CM | POA: Insufficient documentation

## 2019-04-27 DIAGNOSIS — Z79891 Long term (current) use of opiate analgesic: Secondary | ICD-10-CM | POA: Insufficient documentation

## 2019-04-27 DIAGNOSIS — M47817 Spondylosis without myelopathy or radiculopathy, lumbosacral region: Secondary | ICD-10-CM | POA: Diagnosis not present

## 2019-04-27 NOTE — Patient Instructions (Signed)

## 2019-04-27 NOTE — Progress Notes (Signed)
Right lumbar L3, L4 medial branch blocks and L5 dorsal ramus injection under fluoroscopic guidance  Indication: Right Lumbar pain which is not relieved by medication management or other conservative care and interfering with self-care and mobility.  Informed consent was obtained after describing risks and benefits of the procedure with the patient, this includes bleeding, bruising, infection, paralysis and medication side effects. The patient wishes to proceed and has given written consent. The patient was placed in a prone position. The lumbar area was marked and prepped with Betadine. One ML of 1% lidocaine was injected into each of 3 areas into the skin and subcutaneous tissue. Then a 22-gauge 3.5 in spinal needle was inserted targeting the junction of the Right S1 superior articular process and sacral ala junction. Needle was advanced under fluoroscopic guidance. Bone contact was made.Isovue 200 was injected x0.5 mL demonstrating no intravascular uptake. Then a solution containing 2% MPF lidocaine was injected x0.5 mL. Then the Right L5 superior articular process in transverse process junction was targeted. Bone contact was made.Isovue 200 was injected x0.5 mL demonstrating no intravascular uptake. Then a solution containing 2% MPF lidocaine was injected x0.5 mL. Then the Right L4 superior articular process in transverse process junction was targeted. Bone contact was made. Isovue 200 was injected x0.5 mL demonstrating no intravascular uptake. Then a solution containing2% MPF lidocaine was injected x0.5 mL Patient tolerated procedure well. Post procedure instructions were given. Please refer to post procedure form.  Preinjection 5/10 Post injection 0/10  On 03/14/2019 Bilateral L3-4-5 Medial branch blocks Preinjection 6/10 Post injection 0/10 on right , 3/10 on left   Based on response will order R L5 dorsal ramus , R L3,4 medial branch radiofrequency neurotomy

## 2019-04-27 NOTE — Progress Notes (Signed)
  PROCEDURE RECORD [facility]   Name: Xaiden Fleig DOB:[birth year] MRN: [id number]  Date:04/27/2019  Physician: Alysia Penna, MD    Nurse/CMA: LEE, CMA  Allergies:  Allergies  Allergen Reactions  . Penicillins Hives and Other (See Comments)    [ +FAMILY HISTORY] Has patient had a PCN reaction causing immediate rash, facial/tongue/throat swelling, SOB or lightheadedness with hypotension: Unknown Has patient had a PCN reaction causing severe rash involving mucus membranes or skin necrosis: Unknown Has patient had a PCN reaction that required hospitalization: No Has patient had a PCN reaction occurring within the last 10 years: No If all of the above answers are "NO", then may proceed with Cephalosporin use.    . Food Nausea And Vomiting and Other (See Comments)    GREEN PEPPERS- flushed  PT DOES NOT EAT MEAT  . Mushroom Extract Complex Nausea And Vomiting and Other (See Comments)    And flushed  . Adhesive [Tape] Rash  . Morphine And Related Itching    Consent Signed: Yes.    Is patient diabetic? No.  CBG today?   Pregnant: No. LMP: No LMP for male patient. (age 55-55)  Anticoagulants: no Anti-inflammatory: no Antibiotics: no  Procedure: right L3,4,5 medial branch block  Position: Prone Start Time: 2:06pm       End Time: 2:12lm  Fluoro Time: 24  RN/CMA Alvan Culpepper, CMA Lee, CMA    Time 1:30pm 2:19pm    BP 149/89 138/87    Pulse 93 79    Respirations 14 14    O2 Sat 95 97    S/S 6 6    Pain Level 5/10 0/10     D/C home with self , patient A & O X 3, D/C instructions reviewed, and sits independently.

## 2019-05-26 ENCOUNTER — Encounter: Payer: Self-pay | Admitting: Physical Medicine & Rehabilitation

## 2019-05-26 ENCOUNTER — Other Ambulatory Visit: Payer: Self-pay

## 2019-05-26 ENCOUNTER — Encounter
Payer: No Typology Code available for payment source | Attending: Physical Medicine & Rehabilitation | Admitting: Physical Medicine & Rehabilitation

## 2019-05-26 VITALS — BP 143/82 | HR 87 | Temp 97.7°F | Ht 74.0 in | Wt 219.0 lb

## 2019-05-26 DIAGNOSIS — G894 Chronic pain syndrome: Secondary | ICD-10-CM | POA: Insufficient documentation

## 2019-05-26 DIAGNOSIS — Z79891 Long term (current) use of opiate analgesic: Secondary | ICD-10-CM | POA: Insufficient documentation

## 2019-05-26 DIAGNOSIS — M47817 Spondylosis without myelopathy or radiculopathy, lumbosacral region: Secondary | ICD-10-CM | POA: Diagnosis not present

## 2019-05-26 DIAGNOSIS — Z5181 Encounter for therapeutic drug level monitoring: Secondary | ICD-10-CM | POA: Insufficient documentation

## 2019-05-26 NOTE — Progress Notes (Signed)
  PROCEDURE RECORD [facility]   Name: Randy Greene DOB:1966/01/23 MRN: [id number]  Date:05/26/2019  Physician: Alysia Penna, MD    Nurse/CMA: Betti Cruz  Allergies:  Allergies  Allergen Reactions  . Penicillins Hives and Other (See Comments)    [ +FAMILY HISTORY] Has patient had a PCN reaction causing immediate rash, facial/tongue/throat swelling, SOB or lightheadedness with hypotension: Unknown Has patient had a PCN reaction causing severe rash involving mucus membranes or skin necrosis: Unknown Has patient had a PCN reaction that required hospitalization: No Has patient had a PCN reaction occurring within the last 10 years: No If all of the above answers are "NO", then may proceed with Cephalosporin use.    . Food Nausea And Vomiting and Other (See Comments)    GREEN PEPPERS- flushed  PT DOES NOT EAT MEAT  . Mushroom Extract Complex Nausea And Vomiting and Other (See Comments)    And flushed  . Adhesive [Tape] Rash  . Morphine And Related Itching    Consent Signed: Yes.    Is patient diabetic? No.  CBG today?  Pregnant: No. LMP: No LMP for male patient. (age 55-55)  Anticoagulants: no Anti-inflammatory: no Antibiotics: yes (finished zpack 4 days ago)  Procedure: Right L3/5 Radiofrequency Neurotomy  Position: Prone   Start Time: 1015am End Time: 1028am Fluoro Time: 39s  RN/CMA Truman Hayward, CMA Bright,CMA    Time 9:42am 1035am    BP 143/82 141/85    Pulse 87 68    Respirations 16 16    O2 Sat 96 97    S/S 6 6    Pain Level 7/10 0/10     D/C home with alone, patient A & O X 3, D/C instructions reviewed, and sits independently.

## 2019-05-26 NOTE — Patient Instructions (Signed)

## 2019-06-30 ENCOUNTER — Other Ambulatory Visit: Payer: Self-pay

## 2019-06-30 ENCOUNTER — Encounter: Payer: Self-pay | Admitting: Physical Medicine & Rehabilitation

## 2019-06-30 ENCOUNTER — Encounter
Payer: No Typology Code available for payment source | Attending: Physical Medicine & Rehabilitation | Admitting: Physical Medicine & Rehabilitation

## 2019-06-30 VITALS — BP 118/80 | HR 73 | Temp 97.9°F | Ht 74.0 in | Wt 224.2 lb

## 2019-06-30 DIAGNOSIS — G894 Chronic pain syndrome: Secondary | ICD-10-CM | POA: Insufficient documentation

## 2019-06-30 DIAGNOSIS — Z79891 Long term (current) use of opiate analgesic: Secondary | ICD-10-CM | POA: Diagnosis present

## 2019-06-30 DIAGNOSIS — Z5181 Encounter for therapeutic drug level monitoring: Secondary | ICD-10-CM | POA: Diagnosis not present

## 2019-06-30 DIAGNOSIS — M47817 Spondylosis without myelopathy or radiculopathy, lumbosacral region: Secondary | ICD-10-CM | POA: Diagnosis not present

## 2019-06-30 NOTE — Progress Notes (Signed)
  PROCEDURE RECORD [facility]   Name: Nasario Czerniak DOB:07-Aug-1965 MRN: [id number]  Date:06/30/2019  Physician: Alysia Penna, MD    Nurse/CMA: Wessling,CMA  Allergies:  Allergies  Allergen Reactions  . Penicillins Hives and Other (See Comments)    [ +FAMILY HISTORY] Has patient had a PCN reaction causing immediate rash, facial/tongue/throat swelling, SOB or lightheadedness with hypotension: Unknown Has patient had a PCN reaction causing severe rash involving mucus membranes or skin necrosis: Unknown Has patient had a PCN reaction that required hospitalization: No Has patient had a PCN reaction occurring within the last 10 years: No If all of the above answers are "NO", then may proceed with Cephalosporin use.    . Food Nausea And Vomiting and Other (See Comments)    GREEN PEPPERS- flushed  PT DOES NOT EAT MEAT  . Mushroom Extract Complex Nausea And Vomiting and Other (See Comments)    And flushed  . Adhesive [Tape] Rash  . Morphine And Related Itching    Consent Signed: Yes.    Is patient diabetic? No.  CBG today?  Pregnant: No. LMP: No LMP for male patient. (age 26-55)  Anticoagulants: no Anti-inflammatory: no Antibiotics: no  Procedure: Left L3,4,5 Medial Branch Block Position: Prone Start Time: 10:21am     End Time: 10:26am     Fluoro Time: 23s  RN/CMA Truman Hayward, CMA Wessling,CMA    Time 9:43 am 10:34am    BP 118/80 134/89    Pulse 73 76    Respirations 16 16    O2 Sat 96 96    S/S 6 6    Pain Level 5/10 0/10     D/C home with no one , patient A & O X 3, D/C instructions reviewed, and sits independently.

## 2019-06-30 NOTE — Patient Instructions (Signed)
Lumbar medial branch blocks were performed. This is to help diagnose the cause of the low back pain. It is important that you keep track of your pain for the first day or 2 after injection. This injection can give you temporary relief that lasts for hours or up to several months. There is no way to predict duration of pain relief.  Please try to compare your pain after injection to for the injection.  Next sep is left  radiofrequency ablation

## 2019-06-30 NOTE — Progress Notes (Signed)
Left Lumbar L3, L4  medial branch blocks and L 5 dorsal ramus injection under fluoroscopic guidance   Indication: Left Lumbar pain which is not relieved by medication management or other conservative care and interfering with self-care and mobility.  Informed consent was obtained after describing risks and benefits of the procedure with the patient, this includes bleeding, bruising, infection, paralysis and medication side effects.  The patient wishes to proceed and has given written consent.  The patient was placed in a prone position.  The lumbar area was marked and prepped with Betadine.  One mL of 1% lidocaine was injected into each of 3 areas into the skin and subcutaneous tissue.  Then a 22-gauge 3.5in spinal needle was inserted targeting the junction of the left S1 superior articular process and sacral ala junction.  Needle was advanced under fluoroscopic guidance.  Bone contact was made. Isovue 200 was injected x 0.5 mL demonstrating no intravascular uptake.  Then a solution containing  2% MPF lidocaine was injected x 0.5 mL.  Then the left L5 superior articular process in transverse process junction was targeted.  Bone contact was made. Isovue 200 was injected x 0.5 mL demonstrating no intravascular uptake.  Then a solution containing 2% MPF lidocaine was injected x 0.5 mL.  Then the left L4 superior articular process in transverse process junction was targeted.  Bone contact was made.  Isovue 200 was injected x 0.5 mL demonstrating no intravascular uptake.  Then a solution containing  2% MPF lidocaine was injected x 0.5 mL.  Patient tolerated procedure well.  Post procedure instructions were given. 

## 2019-07-28 ENCOUNTER — Encounter: Payer: Self-pay | Admitting: Physical Medicine & Rehabilitation

## 2019-07-28 ENCOUNTER — Encounter
Payer: No Typology Code available for payment source | Attending: Physical Medicine & Rehabilitation | Admitting: Physical Medicine & Rehabilitation

## 2019-07-28 ENCOUNTER — Other Ambulatory Visit: Payer: Self-pay

## 2019-07-28 VITALS — BP 145/84 | HR 91 | Temp 97.7°F | Ht 74.0 in | Wt 222.0 lb

## 2019-07-28 DIAGNOSIS — M47817 Spondylosis without myelopathy or radiculopathy, lumbosacral region: Secondary | ICD-10-CM | POA: Diagnosis not present

## 2019-07-28 DIAGNOSIS — Z79891 Long term (current) use of opiate analgesic: Secondary | ICD-10-CM | POA: Diagnosis present

## 2019-07-28 DIAGNOSIS — G894 Chronic pain syndrome: Secondary | ICD-10-CM | POA: Diagnosis present

## 2019-07-28 DIAGNOSIS — Z5181 Encounter for therapeutic drug level monitoring: Secondary | ICD-10-CM | POA: Insufficient documentation

## 2019-07-28 NOTE — Patient Instructions (Addendum)
You had a radio frequency procedure today This was done to alleviate joint pain in your lumbar area We injected lidocaine which is a local anesthetic.  You may experience soreness at the injection sites. You may also experienced some irritation of the nerves that were heated I'm recommending ice for 30 minutes every 2 hours as needed for the next 24-48 hours   

## 2019-07-28 NOTE — Progress Notes (Signed)

## 2019-07-28 NOTE — Progress Notes (Signed)
  PROCEDURE RECORD [facility]   Name: Randy Greene DOB:[birth year] MRN: [id number]  Date:07/28/2019  Physician: Alysia Penna, MD    Nurse/CMA: Kendahl Bumgardner CMA  Allergies:  Allergies  Allergen Reactions  . Penicillins Hives and Other (See Comments)    [ +FAMILY HISTORY] Has patient had a PCN reaction causing immediate rash, facial/tongue/throat swelling, SOB or lightheadedness with hypotension: Unknown Has patient had a PCN reaction causing severe rash involving mucus membranes or skin necrosis: Unknown Has patient had a PCN reaction that required hospitalization: No Has patient had a PCN reaction occurring within the last 10 years: No If all of the above answers are "NO", then may proceed with Cephalosporin use.    . Food Nausea And Vomiting and Other (See Comments)    GREEN PEPPERS- flushed  PT DOES NOT EAT MEAT  . Mushroom Extract Complex Nausea And Vomiting and Other (See Comments)    And flushed  . Adhesive [Tape] Rash  . Morphine And Related Itching    Consent Signed: Yes.    Is patient diabetic? No.  CBG today? NA  Pregnant: No. LMP: No LMP for male patient. (age 71-55)  Anticoagulants: no Anti-inflammatory: no Antibiotics: no  Procedure: left L3-5 Radiofrequency Ablasion nerurotomy Position: Prone   Start Time: 1007am End Time: 1020am Fluoro Time: 35s  RN/CMA Randy Greene CMA Randy Greene CMA    Time 936am 1030am    BP 145/84 126/83    Pulse 91 87    Respirations 16 16    O2 Sat 95 97    S/S 6 6    Pain Level 7/10 3/10     D/C home with self, patient A & O X 3, D/C instructions reviewed, and sits independently.

## 2019-10-19 NOTE — Progress Notes (Signed)
Left L5 dorsal ramus., left L4 and left L3 medial branch radio frequency neurotomy under fluoroscopic guidance  Indication: Low back pain due to lumbar spondylosis which has been relieved on 2 occasions by greater than 50% by lumbar medial branch blocks at corresponding levels.  Informed consent was obtained after describing risks and benefits of the procedure with the patient, this includes bleeding, bruising, infection, paralysis and medication side effects. The patient wishes to proceed and has given written consent. The patient was placed in a prone position. The lumbar and sacral area was marked and prepped with Betadine. A 25-gauge 1-1/2 inch needle was inserted into the skin and subcutaneous tissue at 3 sites in one ML of 2% lidocaine was injected into each site. Then a 18-gauge 10 cm radio frequency needle with a 1 cm curved active tip was inserted targeting the left S1 SAP/sacral ala junction. Bone contact was made and confirmed with lateral imaging.  motor stimulation at 2 Hz confirm proper needle location followed by injection of one ML of 2% MPF lidocaine. Then the left L5 SAP/transverse process junction was targeted. Bone contact was made and confirmed with lateral imaging motor stimulation at 2 Hz confirm proper needle location followed by injection of one ML of the solution containing one ML of  2% MPF lidocaine. Then the left L4 SAP/transverse process junction was targeted. Bone contact was made and confirmed with lateral imaging. motor stimulation at 2 Hz confirm proper needle location followed by injection of one ML of the solution containing one ML of2% MPF lidocaine. Radio frequency lesion  at [facility] for 90 seconds was performed. Needles were removed. Post procedure instructions and vital signs were performed. Patient tolerated procedure well. Followup appointment was given.

## 2019-10-26 ENCOUNTER — Other Ambulatory Visit: Payer: Self-pay

## 2019-10-26 ENCOUNTER — Encounter: Payer: Self-pay | Admitting: Physical Medicine & Rehabilitation

## 2019-10-26 ENCOUNTER — Encounter
Payer: No Typology Code available for payment source | Attending: Physical Medicine & Rehabilitation | Admitting: Physical Medicine & Rehabilitation

## 2019-10-26 VITALS — BP 133/88 | HR 80 | Temp 97.7°F | Ht 74.0 in | Wt 222.4 lb

## 2019-10-26 DIAGNOSIS — M47817 Spondylosis without myelopathy or radiculopathy, lumbosacral region: Secondary | ICD-10-CM | POA: Diagnosis not present

## 2019-10-26 DIAGNOSIS — Z79891 Long term (current) use of opiate analgesic: Secondary | ICD-10-CM | POA: Insufficient documentation

## 2019-10-26 DIAGNOSIS — Z5181 Encounter for therapeutic drug level monitoring: Secondary | ICD-10-CM | POA: Insufficient documentation

## 2019-10-26 DIAGNOSIS — G894 Chronic pain syndrome: Secondary | ICD-10-CM | POA: Diagnosis present

## 2019-10-26 DIAGNOSIS — M25461 Effusion, right knee: Secondary | ICD-10-CM | POA: Diagnosis not present

## 2019-10-26 NOTE — Progress Notes (Signed)
Subjective:    Patient ID: Randy Greene, male    DOB: 03/02/1966, 54 y.o.   MRN: [id number]  HPI  CC  Mild LBP 54 year old male with history of chronic low back pain.  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  His pain continues to be axial but it is fairly minimal at this time. He does not have any lower extremity radiating symptoms.  No lower extremity weakness. He feels like the radiofrequency procedures performed are still working quite well. Used to have problem getting on and off acupuncture table but is no longer having this issue because his pain is under good control in his low back area  Interval medical history significant for right knee partial meniscectomy, arthroscopic Has some persistent effusion  Healing touch   Doing tai chi and yoga remotely  2/5/21Left L5 dorsal ramus., left L4 and left L3 medial branch radio frequency neurotomy under fluoroscopic guidance  05/26/2019 RightL5 dorsal ramus., Right L4 and Right L3 medial branch radio frequency neurotomy under fluoroscopic guidance   Pain Inventory Average Pain 6 Pain Right Now 7 My pain is stabbing and aching  In the last 24 hours, has pain interfered with the following? General activity 7 Relation with others 7 Enjoyment of life 7 What TIME of day is your pain at its worst? evening Sleep (in general) NA  Pain is worse with: bending Pain improves with: rest, therapy/exercise, pacing activities and injections Relief from Meds: 6  Mobility use a cane how many minutes can you walk? 5 ability to climb steps?  yes do you drive?  yes  Function not employed: date last employed 2014  Neuro/Psych weakness numbness tingling trouble walking spasms  Prior Studies Any changes since last visit?  no  Physicians involved in your care Any changes since last visit?  no   Family History  Problem Relation Age of Onset  . Dementia Mother   . Hypertension Father   . Heart attack Father     Social History   Socioeconomic History  . Marital status: Single    Spouse name: Not on file  . Number of children: Not on file  . Years of education: Not on file  . Highest education level: Not on file  Occupational History  . Not on file  Tobacco Use  . Smoking status: Former Smoker    Packs/day: 1.00    Years: 20.00    Pack years: 20.00    Types: Cigarettes    Quit date: 11/02/2006    Years since quitting: 12.9  . Smokeless tobacco: Never Used  Substance and Sexual Activity  . Alcohol use: No  . Drug use: No    Comment: medical marijuana  . Sexual activity: Never  Other Topics Concern  . Not on file  Social History Narrative  . Not on file   Social Determinants of Health   Financial Resource Strain:   . Difficulty of Paying Living Expenses:   Food Insecurity:   . Worried About Charity fundraiser in the Last Year:   . Arboriculturist in the Last Year:   Transportation Needs:   . Film/video editor (Medical):   Marland Kitchen Lack of Transportation (Non-Medical):   Physical Activity:   . Days of Exercise per Week:   . Minutes of Exercise per Session:   Stress:   . Feeling of Stress :   Social Connections:   . Frequency of Communication with Friends and Family:   . Frequency  of Social Gatherings with Friends and Family:   . Attends Religious Services:   . Active Member of Clubs or Organizations:   . Attends Archivist Meetings:   Marland Kitchen Marital Status:    Past Surgical History:  Procedure Laterality Date  . ARTHROSCOPIC    . HERNIA REPAIR    . INCISION AND DRAINAGE Left 01/04/2017   Procedure: INCISION AND DRAINAGE LEFT KNEE;  Surgeon: Rod Can, MD;  Location: [facility];  Service: Orthopedics;  Laterality: Left;  . jawsurgery    . KNEE ARTHROPLASTY Left 08/10/2016   Procedure: LEFT TOTAL KNEE ARTHROPLASTY WITH COMPUTER NAVIGATION;  Surgeon: Rod Can, MD;  Location: [facility];  Service: Orthopedics;  Laterality: Left;  . knees scope     Past Medical  History:  Diagnosis Date  . Acid reflux   . Arthritis   . Aspiration pneumonia ([place])   . Asthma   . Delayed wound healing    left knee  . Dyspnea   . Enlarged prostate   . Hypercapnic respiratory failure, chronic ([place])   . Hypertension   . MS (multiple sclerosis) ([facility])    BP 133/88   Pulse 80   Temp 97.7 F (36.5 C)   Ht 6' 2"  (1.88 m)   Wt 222 lb 6.4 oz (100.9 kg)   SpO2 97%   BMI 28.55 kg/m   Opioid Risk Score:   Fall Risk Score:  `1  Depression screen PHQ 2/9  Depression screen [facility] 2/9 10/26/2019 02/21/2019  Decreased Interest 0 0  Down, Depressed, Hopeless 0 0  PHQ - 2 Score 0 0  Altered sleeping - 0  Tired, decreased energy - 2  Change in appetite - 1  Feeling bad or failure about yourself  - 0  Trouble concentrating - 2  Moving slowly or fidgety/restless - 0  Suicidal thoughts - 0  PHQ-9 Score - 5  Difficult doing work/chores - Somewhat difficult    Review of Systems  Constitutional: Positive for unexpected weight change.  HENT: Negative.   Eyes: Negative.   Respiratory: Negative.   Cardiovascular: Negative.   Gastrointestinal: Negative.   Endocrine: Negative.   Genitourinary: Negative.   Musculoskeletal: Positive for gait problem.  Skin: Negative.   Allergic/Immunologic: Negative.   Neurological: Positive for weakness and numbness.       Tingling  Hematological: Negative.   Psychiatric/Behavioral: Negative.   All other systems reviewed and are negative.      Objective:   Physical Exam Vitals and nursing note reviewed.  Constitutional:      Appearance: Normal appearance.  Eyes:     Extraocular Movements: Extraocular movements intact.     Conjunctiva/sclera: Conjunctivae normal.     Pupils: Pupils are equal, round, and reactive to light.  Musculoskeletal:     Comments: Right knee range of motion is normal he does have an effusion moderate mainly supra patellar Left knee has healed TKR incision good range of motion Lumbar spine no tenderness  palpation lumbar paraspinals and in the gluteus area Negative straight leg raising  Neurological:     Mental Status: He is alert and oriented to person, place, and time.     Motor: Atrophy present.     Comments: Motor strength is 5/5 bilateral hip flexor knee extensor ankle dorsiflexor  Mild right quad atrophy  Gait without evidence of toe drag or knee instability but he does use a cane  Psychiatric:        Mood and Affect: Mood normal.  Behavior: Behavior normal.        Thought Content: Thought content normal.        Judgment: Judgment normal.           Assessment & Plan:  #1.  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  Still doing well after radiofrequency neurotomy performed 3 months ago on the left side and 5 months ago on the right side.  No sign of recurrent severe pain As discussed with the patient difficult to take actual duration.  He has received VA authorization for another 6 months today.  I would expect that he will need repeat radiofrequency neurotomy L3-L4 medial branch and L5 dorsal ramus within the next 6 months.  I would suspect the right side to require retreatment sooner than the left. 2.  Right knee effusion secondary to meniscal degenerative tears we discussed that effusion will tend to improve as his quad strength improves.

## 2019-10-26 NOTE — Patient Instructions (Signed)
Please call when back pain worsens

## 2020-01-23 ENCOUNTER — Encounter: Payer: Self-pay | Admitting: Physical Medicine & Rehabilitation

## 2020-01-23 ENCOUNTER — Encounter
Payer: No Typology Code available for payment source | Attending: Physical Medicine & Rehabilitation | Admitting: Physical Medicine & Rehabilitation

## 2020-01-23 ENCOUNTER — Other Ambulatory Visit: Payer: Self-pay

## 2020-01-23 VITALS — BP 134/88 | HR 81 | Temp 98.6°F | Ht 74.0 in | Wt 221.0 lb

## 2020-01-23 DIAGNOSIS — G894 Chronic pain syndrome: Secondary | ICD-10-CM | POA: Diagnosis present

## 2020-01-23 DIAGNOSIS — M47817 Spondylosis without myelopathy or radiculopathy, lumbosacral region: Secondary | ICD-10-CM | POA: Diagnosis not present

## 2020-01-23 DIAGNOSIS — Z5181 Encounter for therapeutic drug level monitoring: Secondary | ICD-10-CM | POA: Diagnosis not present

## 2020-01-23 DIAGNOSIS — Z79891 Long term (current) use of opiate analgesic: Secondary | ICD-10-CM | POA: Diagnosis present

## 2020-01-23 NOTE — Patient Instructions (Signed)
You had a radio frequency procedure today This was done to alleviate joint pain in your lumbar area We injected lidocaine which is a local anesthetic.  You may experience soreness at the injection sites. You may also experienced some irritation of the nerves that were heated I'm recommending ice for 30 minutes every 2 hours as needed for the next 24-48 hours   

## 2020-01-23 NOTE — Progress Notes (Signed)
  PROCEDURE RECORD [facility]   Name: Randy Greene DOB:Nov 11, 1965 MRN: [id number]  Date:01/23/2020  Physician: Alysia Penna, MD    Nurse/CMA: Wessling, CMA  Allergies:  Allergies  Allergen Reactions  . Penicillins Hives and Other (See Comments)    [ +FAMILY HISTORY] Has patient had a PCN reaction causing immediate rash, facial/tongue/throat swelling, SOB or lightheadedness with hypotension: Unknown Has patient had a PCN reaction causing severe rash involving mucus membranes or skin necrosis: Unknown Has patient had a PCN reaction that required hospitalization: No Has patient had a PCN reaction occurring within the last 10 years: No If all of the above answers are "NO", then may proceed with Cephalosporin use.    . Food Nausea And Vomiting and Other (See Comments)    GREEN PEPPERS- flushed  PT DOES NOT EAT MEAT  . Mushroom Extract Complex Nausea And Vomiting and Other (See Comments)    And flushed  . Adhesive [Tape] Rash  . Morphine And Related Itching    Consent Signed: Yes.    Is patient diabetic? No.  CBG today?   Pregnant: No. LMP: No LMP for male patient. (age 23-55)  Anticoagulants: no Anti-inflammatory: no Antibiotics: no  Procedure: L3,4,5 radiofrequency  Position: Prone Start Time: 10:27am   End Time: 10:37am  Fluoro Time: 27s  RN/CMA Wessling, CMA Wessling, CMA    Time 10:10am 10:47am    BP 134/88 130/88    Pulse 81 87    Respirations 16 16    O2 Sat 96 98    S/S 6 6    Pain Level 7/10 0/10     D/C home with self, patient A & O X 3, D/C instructions reviewed, and sits independently.

## 2020-01-23 NOTE — Progress Notes (Signed)

## 2020-02-20 ENCOUNTER — Encounter (HOSPITAL_BASED_OUTPATIENT_CLINIC_OR_DEPARTMENT_OTHER): Payer: No Typology Code available for payment source | Admitting: Physical Medicine & Rehabilitation

## 2020-02-20 ENCOUNTER — Encounter: Payer: Self-pay | Admitting: Physical Medicine & Rehabilitation

## 2020-02-20 ENCOUNTER — Other Ambulatory Visit: Payer: Self-pay

## 2020-02-20 VITALS — BP 136/88 | HR 76 | Temp 97.7°F | Ht 74.0 in | Wt 224.0 lb

## 2020-02-20 DIAGNOSIS — M47817 Spondylosis without myelopathy or radiculopathy, lumbosacral region: Secondary | ICD-10-CM

## 2020-02-20 DIAGNOSIS — Z5181 Encounter for therapeutic drug level monitoring: Secondary | ICD-10-CM | POA: Diagnosis not present

## 2020-02-20 NOTE — Progress Notes (Signed)
Left L5 dorsal ramus., left L4 and left L3 medial branch radio frequency neurotomy under fluoroscopic guidance  Indication: Low back pain due to lumbar spondylosis which has been relieved on 2 occasions by greater than 50% by lumbar medial branch blocks at corresponding levels.  Informed consent was obtained after describing risks and benefits of the procedure with the patient, this includes bleeding, bruising, infection, paralysis and medication side effects. The patient wishes to proceed and has given written consent. The patient was placed in a prone position. The lumbar and sacral area was marked and prepped with Betadine. A 25-gauge 1-1/2 inch needle was inserted into the skin and subcutaneous tissue at 3 sites in one ML of 2% lidocaine was injected into each site. Then a 18-gauge 10 cm radio frequency needle with a 1 cm curved active tip was inserted targeting the left S1 SAP/sacral ala junction. Bone contact was made and confirmed with lateral imaging.  motor stimulation at 2 Hz confirm proper needle location followed by injection of one ML of 2% MPF lidocaine. Then the left L5 SAP/transverse process junction was targeted. Bone contact was made and confirmed with lateral imaging motor stimulation at 2 Hz confirm proper needle location followed by injection of one ML of the solution containing one ML of  2% MPF lidocaine. Then the left L4 SAP/transverse process junction was targeted. Bone contact was made and confirmed with lateral imaging. motor stimulation at 2 Hz confirm proper needle location followed by injection of one ML of the solution containing one ML of2% MPF lidocaine. Radio frequency lesion  at [facility] for 90 seconds was performed. Needles were removed. Post procedure instructions and vital signs were performed. Patient tolerated procedure well. Followup appointment was given.

## 2020-02-20 NOTE — Patient Instructions (Signed)

## 2020-02-20 NOTE — Progress Notes (Signed)
  PROCEDURE RECORD [facility]   Name: Randy Greene DOB:April 14, 1966 MRN: [id number]  Date:02/20/2020  Physician: Alysia Penna, MD    Nurse/CMA: Jorja Loa MA  Allergies:  Allergies  Allergen Reactions  . Penicillins Hives and Other (See Comments)    [ +FAMILY HISTORY] Has patient had a PCN reaction causing immediate rash, facial/tongue/throat swelling, SOB or lightheadedness with hypotension: Unknown Has patient had a PCN reaction causing severe rash involving mucus membranes or skin necrosis: Unknown Has patient had a PCN reaction that required hospitalization: No Has patient had a PCN reaction occurring within the last 10 years: No If all of the above answers are "NO", then may proceed with Cephalosporin use.    . Food Nausea And Vomiting and Other (See Comments)    GREEN PEPPERS- flushed  PT DOES NOT EAT MEAT  . Mushroom Extract Complex Nausea And Vomiting and Other (See Comments)    And flushed  . Adhesive [Tape] Rash  . Morphine And Related Itching    Consent Signed: Yes.    Is patient diabetic? No.  CBG today?N/a  Pregnant: No. LMP: No LMP for male patient. (age 56-55)  Anticoagulants: no Anti-inflammatory: no Antibiotics: no  Procedure: Left RF L3, 4,5  Position: Prone Start Time: 10:59AM End Time:11:13AM  Fluoro Time:39  RN/CMA Agnieszka Newhouse MA Justun Anaya MA    Time 10:42 AM 11:20 AM    BP 136/88 132/90    Pulse 75 65    Respirations 14 16    O2 Sat 96 99    S/S 6 6    Pain Level 5/10 3/10     D/C home with SELF patient A & O X 3, D/C instructions reviewed, and sits independently.

## 2020-05-30 ENCOUNTER — Encounter: Payer: Medicare Other | Attending: Physical Medicine & Rehabilitation | Admitting: Physical Medicine & Rehabilitation

## 2020-06-11 ENCOUNTER — Other Ambulatory Visit: Payer: Self-pay

## 2020-06-11 ENCOUNTER — Emergency Department (HOSPITAL_COMMUNITY): Payer: No Typology Code available for payment source

## 2020-06-11 ENCOUNTER — Emergency Department (HOSPITAL_COMMUNITY)
Admission: EM | Admit: 2020-06-11 | Discharge: 2020-06-11 | Disposition: A | Discharge status: Home / Self Care | Payer: No Typology Code available for payment source | Attending: Emergency Medicine | Admitting: Emergency Medicine

## 2020-06-11 DIAGNOSIS — X58XXXA Exposure to other specified factors, initial encounter: Secondary | ICD-10-CM | POA: Diagnosis not present

## 2020-06-11 DIAGNOSIS — Y92000 Kitchen of unspecified non-institutional (private) residence as  the place of occurrence of the external cause: Secondary | ICD-10-CM | POA: Insufficient documentation

## 2020-06-11 DIAGNOSIS — S0083XA Contusion of other part of head, initial encounter: Secondary | ICD-10-CM | POA: Insufficient documentation

## 2020-06-11 DIAGNOSIS — I10 Essential (primary) hypertension: Secondary | ICD-10-CM | POA: Insufficient documentation

## 2020-06-11 DIAGNOSIS — R110 Nausea: Secondary | ICD-10-CM | POA: Diagnosis not present

## 2020-06-11 DIAGNOSIS — Z87891 Personal history of nicotine dependence: Secondary | ICD-10-CM | POA: Diagnosis not present

## 2020-06-11 DIAGNOSIS — Z7982 Long term (current) use of aspirin: Secondary | ICD-10-CM | POA: Diagnosis not present

## 2020-06-11 DIAGNOSIS — E871 Hypo-osmolality and hyponatremia: Secondary | ICD-10-CM | POA: Diagnosis not present

## 2020-06-11 DIAGNOSIS — S0993XA Unspecified injury of face, initial encounter: Secondary | ICD-10-CM | POA: Diagnosis present

## 2020-06-11 DIAGNOSIS — R42 Dizziness and giddiness: Secondary | ICD-10-CM | POA: Insufficient documentation

## 2020-06-11 DIAGNOSIS — Z20822 Contact with and (suspected) exposure to covid-19: Secondary | ICD-10-CM | POA: Insufficient documentation

## 2020-06-11 DIAGNOSIS — J45909 Unspecified asthma, uncomplicated: Secondary | ICD-10-CM | POA: Insufficient documentation

## 2020-06-11 DIAGNOSIS — Y9301 Activity, walking, marching and hiking: Secondary | ICD-10-CM | POA: Insufficient documentation

## 2020-06-11 DIAGNOSIS — Z79899 Other long term (current) drug therapy: Secondary | ICD-10-CM | POA: Insufficient documentation

## 2020-06-11 DIAGNOSIS — Z96652 Presence of left artificial knee joint: Secondary | ICD-10-CM | POA: Insufficient documentation

## 2020-06-11 DIAGNOSIS — Z8616 Personal history of COVID-19: Secondary | ICD-10-CM | POA: Insufficient documentation

## 2020-06-11 LAB — URINALYSIS, ROUTINE W REFLEX MICROSCOPIC
Bilirubin Urine: NEGATIVE
Glucose, UA: NEGATIVE mg/dL
Hgb urine dipstick: NEGATIVE
Ketones, ur: NEGATIVE mg/dL
Leukocytes,Ua: NEGATIVE
Nitrite: NEGATIVE
Protein, ur: NEGATIVE mg/dL
Specific Gravity, Urine: 1.014 (ref 1.005–1.030)
pH: 6 (ref 5.0–8.0)

## 2020-06-11 LAB — CBC
HCT: 36.8 % — ABNORMAL LOW (ref 39.0–52.0)
Hemoglobin: 12 g/dL — ABNORMAL LOW (ref 13.0–17.0)
MCH: 28.8 pg (ref 26.0–34.0)
MCHC: 32.6 g/dL (ref 30.0–36.0)
MCV: 88.5 fL (ref 80.0–100.0)
Platelets: 246 10*3/uL (ref 150–400)
RBC: 4.16 MIL/uL — ABNORMAL LOW (ref 4.22–5.81)
RDW: 13.7 % (ref 11.5–15.5)
WBC: 13.3 10*3/uL — ABNORMAL HIGH (ref 4.0–10.5)
nRBC: 0 % (ref 0.0–0.2)

## 2020-06-11 LAB — DIFFERENTIAL
Abs Immature Granulocytes: 0.11 10*3/uL — ABNORMAL HIGH (ref 0.00–0.07)
Basophils Absolute: 0 10*3/uL (ref 0.0–0.1)
Basophils Relative: 0 %
Eosinophils Absolute: 0 10*3/uL (ref 0.0–0.5)
Eosinophils Relative: 0 %
Immature Granulocytes: 1 %
Lymphocytes Relative: 4 %
Lymphs Abs: 0.5 10*3/uL — ABNORMAL LOW (ref 0.7–4.0)
Monocytes Absolute: 1 10*3/uL (ref 0.1–1.0)
Monocytes Relative: 7 %
Neutro Abs: 11.7 10*3/uL — ABNORMAL HIGH (ref 1.7–7.7)
Neutrophils Relative %: 88 %

## 2020-06-11 LAB — PROTIME-INR
INR: 1 (ref 0.8–1.2)
Prothrombin Time: 12.9 seconds (ref 11.4–15.2)

## 2020-06-11 LAB — COMPREHENSIVE METABOLIC PANEL
ALT: 20 U/L (ref 0–44)
AST: 20 U/L (ref 15–41)
Albumin: 3.8 g/dL (ref 3.5–5.0)
Alkaline Phosphatase: 60 U/L (ref 38–126)
Anion gap: 11 (ref 5–15)
BUN: 9 mg/dL (ref 6–20)
CO2: 22 mmol/L (ref 22–32)
Calcium: 8.8 mg/dL — ABNORMAL LOW (ref 8.9–10.3)
Chloride: 95 mmol/L — ABNORMAL LOW (ref 98–111)
Creatinine, Ser: 0.7 mg/dL (ref 0.61–1.24)
GFR, Estimated: >60 mL/min (ref >60)
Glucose, Bld: 118 mg/dL — ABNORMAL HIGH (ref 70–99)
Potassium: 4.4 mmol/L (ref 3.5–5.1)
Sodium: 128 mmol/L — ABNORMAL LOW (ref 135–145)
Total Bilirubin: 0.4 mg/dL (ref 0.3–1.2)
Total Protein: 6.6 g/dL (ref 6.5–8.1)

## 2020-06-11 LAB — RESP PANEL BY RT-PCR (FLU A&B, COVID) ARPGX2
Influenza A by PCR: NEGATIVE
Influenza B by PCR: NEGATIVE
SARS Coronavirus 2 by RT PCR: NEGATIVE

## 2020-06-11 LAB — RAPID URINE DRUG SCREEN, HOSP PERFORMED
Amphetamines: NOT DETECTED
Barbiturates: NOT DETECTED
Benzodiazepines: NOT DETECTED
Cocaine: NOT DETECTED
Opiates: NOT DETECTED
Tetrahydrocannabinol: POSITIVE — AB

## 2020-06-11 LAB — ETHANOL: Alcohol, Ethyl (B): <10 mg/dL (ref <10)

## 2020-06-11 LAB — APTT: aPTT: 22 seconds — ABNORMAL LOW (ref 24–36)

## 2020-06-11 MED ORDER — SODIUM CHLORIDE 0.9 % IV SOLN: 1000.0000 mL | INTRAVENOUS | Status: DC

## 2020-06-11 MED ORDER — SODIUM CHLORIDE 0.9 % IV BOLUS (SEPSIS)
500.0000 mL | Freq: Once | INTRAVENOUS | Status: AC
  Administered 2020-06-11: 500 mL via INTRAVENOUS

## 2020-06-11 NOTE — Discharge Instructions (Signed)
You have been seen and discharged from the emergency department.  Follow-up with your primary provider for reevaluation and repeat blood work. Take home medications as prescribed.  Stay well-hydrated.  If you have any worsening symptoms, severe head pain, confusion or further concerns or health please return to emergency department for further evaluation.

## 2020-06-11 NOTE — ED Notes (Signed)
Pt ambulated independently in room while attempting to void.

## 2020-06-11 NOTE — ED Triage Notes (Addendum)
PT BIBA from home after a fall. Pt woke up this AM feeling dizzy and nauseous. Pt walked to the kitchen and fell on the way back to his room landing face first. Denies LOC. No blood thinners. Pt in C-collar. Hematoma assessed on forehead above left eye. Hx of MS. Pt had a COVID exposure yesterday as well but patient is fully vaccinated with booster.

## 2020-06-11 NOTE — ED Notes (Signed)
Patient transported to CT 

## 2020-06-11 NOTE — ED Notes (Signed)
DC instructions reviewed with pt. PT verbalized understanding. PT DC °

## 2020-06-11 NOTE — ED Provider Notes (Signed)
[facility] EMERGENCY DEPARTMENT Provider Note   CSN: [id number] Arrival date & time: 06/11/20  1202     History Chief Complaint  Patient presents with  . Fall    Randy Greene is a 54 y.o. male.  HPI   Pt states he woke up this am and he felt dizzy.  He felt lightheaded when he stood.  The room was not spinning.  Pt has ms but usually does not have trouble with dizziness.  No trouble with coordination.  No trouble with speech or vision.  Pt did end up falling and hit his face on the ground.  He did injure his nose and it bled.  He was able to get up with assistance.  No other pain other feeling soreness in his arms when he tried to break his fall.  Patient has not been having trouble with cough or congestion but may have a been exposed to Covid recently.  He has been fully vaccinated.  Past Medical History:  Diagnosis Date  . Acid reflux   . Arthritis   . Aspiration pneumonia ([place])   . Asthma   . Delayed wound healing    left knee  . Dyspnea   . Enlarged prostate   . Hypercapnic respiratory failure, chronic ([employer])   . Hypertension   . MS (multiple sclerosis) [facility])     Patient Active Problem List   Diagnosis Date Noted  . Degenerative arthritis of left knee 08/11/2016  . Osteoarthritis of left knee 08/10/2016  . Agitation 03/01/2014  . Hypoxia 01/01/2014  . Chronic hypercapnic respiratory failure ([place]) 01/01/2014  . Metabolic alkalosis with chronic respiratory acidosis 01/01/2014  . OSA on CPAP 01/01/2014  . Acid reflux 07/17/2013  . Sepsis ([place]) 04/11/2013  . Hypokalemia 11/30/2012  . Transaminitis 11/29/2012  . Acute encephalopathy 11/29/2012  . MS (multiple sclerosis) ([place]) 11/29/2012  . Recurrent aspiration bronchitis/pneumonia 11/30/2011    Past Surgical History:  Procedure Laterality Date  . ARTHROSCOPIC    . HERNIA REPAIR    . INCISION AND DRAINAGE Left 01/04/2017   Procedure: INCISION AND DRAINAGE LEFT KNEE;  Surgeon: Rod Can,  MD;  Location: [place];  Service: Orthopedics;  Laterality: Left;  . jawsurgery    . KNEE ARTHROPLASTY Left 08/10/2016   Procedure: LEFT TOTAL KNEE ARTHROPLASTY WITH COMPUTER NAVIGATION;  Surgeon: Rod Can, MD;  Location: [place];  Service: Orthopedics;  Laterality: Left;  . knees scope         Family History  Problem Relation Age of Onset  . Dementia Mother   . Hypertension Father   . Heart attack Father     Social History   Tobacco Use  . Smoking status: Former Smoker    Packs/day: 1.00    Years: 20.00    Pack years: 20.00    Types: Cigarettes    Quit date: 11/02/2006    Years since quitting: 13.6  . Smokeless tobacco: Never Used  Vaping Use  . Vaping Use: Never used  Substance Use Topics  . Alcohol use: No  . Drug use: No    Comment: medical marijuana    Home Medications Prior to Admission medications   Medication Sig Start Date End Date Taking? Authorizing Provider  albuterol (PROVENTIL HFA;VENTOLIN HFA) 108 (90 BASE) MCG/ACT inhaler Inhale 2 puffs into the lungs 2 (two) times daily.   Yes [provider]  amLODipine (NORVASC) 10 MG tablet Take 5 mg by mouth at bedtime.   Yes [provider]  aspirin EC 81 MG tablet Take 81 mg by mouth at bedtime. Swallow whole.   Yes [provider]  baclofen (LIORESAL) 10 MG tablet Take 15 mg by mouth 4 (four) times daily.    Yes [provider]  Calcium Carbonate 500 (200 Ca) MG WAFR Take 1 tablet by mouth in the morning and at bedtime.   Yes [provider]  Cholecalciferol (VITAMIN D) 50 MCG (2000 UT) tablet Take 2,000 Units by mouth daily.   Yes [provider]  dronabinol (MARINOL) 2.5 MG capsule Take 7.5 mg by mouth 3 (three) times daily.    Yes [provider]  famotidine (PEPCID) 40 MG tablet Take 40 mg by mouth at bedtime.   Yes [provider]  finasteride (PROSCAR) 5 MG tablet Take 5 mg by mouth daily.   Yes [provider]   Ibuprofen-Acetaminophen (ADVIL DUAL ACTION PO) Take 1 tablet by mouth daily as needed (pain).   Yes [provider]  losartan (COZAAR) 100 MG tablet Take 50 mg by mouth at bedtime.   Yes [provider]  magnesium (MAGTAB) 84 MG (7MEQ) TBCR SR tablet Take 42 mg by mouth 2 (two) times daily.   Yes [provider]  methylphenidate 36 MG PO CR tablet Take 36 mg by mouth See admin instructions.   Yes [provider]  Omega-3 Fatty Acids (FISH OIL PO) Take 1 capsule by mouth daily.   Yes [provider]  ondansetron (ZOFRAN) 4 MG tablet Take 4 mg by mouth every 8 (eight) hours as needed for nausea or vomiting.   Yes [provider]  Probiotic Product (ALIGN) 4 MG CAPS Take 4 mg by mouth in the morning and at bedtime.   Yes [provider]  simvastatin (ZOCOR) 10 MG tablet Take 10 mg by mouth at bedtime.   Yes [provider]  vitamin C (ASCORBIC ACID) 500 MG tablet Take 500 mg by mouth daily.   Yes [provider]  vitamin E (VITAMIN E) 180 MG (400 UNITS) capsule Take 400 Units by mouth daily.   Yes [provider]  Zinc 50 MG TABS Take 50 mg by mouth every other day.   Yes [provider]  albuterol (PROVENTIL) (2.5 MG/3ML) 0.083% nebulizer solution Take 2.5 mg by nebulization 4 (four) times daily as needed for wheezing.    [provider]  budesonide-formoterol (SYMBICORT) 80-4.5 MCG/ACT inhaler Inhale 1 puff into the lungs 2 (two) times daily as needed (for shortness of breath). Patient not taking: Reported on 06/11/2020    [provider]  Cyanocobalamin 1000 MCG/ML KIT Inject 1,000 mcg as directed every 30 (thirty) days.    [provider]  ipratropium-albuterol (DUONEB) 0.5-2.5 (3) MG/3ML SOLN Take 3 mLs by nebulization every 4 (four) hours as needed (for shortness of breath).    [provider]  lactobacillus acidophilus (BACID) TABS Take 3 tablets by mouth 2  (two) times daily.     [provider]  metoCLOPramide (REGLAN) 10 MG tablet Take 10 mg by mouth daily with supper.    [provider]  montelukast (SINGULAIR) 10 MG tablet Take 10 mg by mouth daily as needed (allergies).     [provider]  pantoprazole (PROTONIX) 40 MG tablet Take 40 mg by mouth 2 (two) times daily.     [provider]  pregabalin (LYRICA) 150 MG capsule Take 150 mg by mouth 3 (three) times daily.    [provider]  ranitidine (ZANTAC) 300 MG tablet Take 300 mg by mouth at bedtime.  Patient not taking: Reported on 06/11/2020    [provider]  Saw Palmetto, Serenoa repens, (SAW PALMETTO PO) Take 1 tablet by mouth 2 (two) times daily.    [provider]  sildenafil (VIAGRA) 100 MG tablet Take 100 mg by mouth daily as needed for erectile dysfunction.    [provider]  tamsulosin (FLOMAX) 0.4 MG CAPS capsule Take 0.4 mg by mouth at bedtime.    [provider]  traMADol (ULTRAM) 50 MG tablet Take 50 mg by mouth every 6 (six) hours as needed.    [provider]    Allergies    Penicillins, Food, Mushroom extract complex, Adhesive [tape], and Morphine and related  Review of Systems   Review of Systems  All other systems reviewed and are negative.   Physical Exam Updated Vital Signs BP (!) 132/101   Pulse 76   Temp 97.8 F (36.6 C) (Oral)   Resp 15   SpO2 99%   Physical Exam Vitals and nursing note reviewed.  Constitutional:      General: He is not in acute distress.    Appearance: He is well-developed and well-nourished.  HENT:     Head: Normocephalic.     Comments: Contusion right forehead, tenderness palpation bridge of nose, no laceration or bleeding    Right Ear: External ear normal.     Left Ear: External ear normal.  Eyes:     General: No scleral icterus.       Right eye: No discharge.        Left eye: No discharge.     Conjunctiva/sclera: Conjunctivae normal.   Neck:     Trachea: No tracheal deviation.  Cardiovascular:     Rate and Rhythm: Normal rate and regular rhythm.     Pulses: Intact distal pulses.  Pulmonary:     Effort: Pulmonary effort is normal. No respiratory distress.     Breath sounds: Normal breath sounds. No stridor. No wheezing or rales.  Abdominal:     General: Bowel sounds are normal. There is no distension.     Palpations: Abdomen is soft.     Tenderness: There is no abdominal tenderness. There is no guarding or rebound.  Musculoskeletal:        General: No tenderness or edema.     Cervical back: Neck supple.  Skin:    General: Skin is warm and dry.     Findings: No rash.  Neurological:     General: No focal deficit present.     Mental Status: He is alert. Mental status is at baseline.     Cranial Nerves: No cranial nerve deficit (no facial droop, extraocular movements intact, no slurred speech).     Sensory: No sensory deficit.     Motor: No abnormal muscle tone or seizure activity.     Coordination: Coordination normal.     Deep Tendon Reflexes: Strength normal.     Comments: Equal grip strength bilaterally, normal plantar flexion laterally  Psychiatric:        Mood and Affect: Mood and affect normal.     ED Results / Procedures / Treatments   Labs (all labs ordered are listed, but only abnormal results are displayed) Labs Reviewed  APTT - Abnormal; Notable for the following components:      Result Value   aPTT 22 (*)    All other components within normal limits  CBC -  Abnormal; Notable for the following components:   WBC 13.3 (*)    RBC 4.16 (*)    Hemoglobin 12.0 (*)    HCT 36.8 (*)    All other components within normal limits  DIFFERENTIAL - Abnormal; Notable for the following components:   Neutro Abs 11.7 (*)    Lymphs Abs 0.5 (*)    Abs Immature Granulocytes 0.11 (*)    All other components within normal limits  COMPREHENSIVE METABOLIC PANEL - Abnormal; Notable for the following components:    Sodium 128 (*)    Chloride 95 (*)    Glucose, Bld 118 (*)    Calcium 8.8 (*)    All other components within normal limits  RESP PANEL BY RT-PCR (FLU A&B, COVID) ARPGX2  ETHANOL  PROTIME-INR  RAPID URINE DRUG SCREEN, HOSP PERFORMED  URINALYSIS, ROUTINE W REFLEX MICROSCOPIC    EKG EKG Interpretation  Date/Time:  Tuesday June 11 2020 12:15:42 EST Ventricular Rate:  89 PR Interval:    QRS Duration: 103 QT Interval:  383 QTC Calculation: 466 R Axis:   4 Text Interpretation: Sinus rhythm Since last tracing rate faster Confirmed by Dorie Rank [id number]) on 06/11/2020 12:24:15 PM   Radiology CT HEAD WO CONTRAST  Result Date: 06/11/2020 CLINICAL DATA:  Focal neuro deficit, greater than 6 hours, stroke suspected. Additional history provided: Dizziness and nausea, fall landing face first. Additional history obtained from [place] NUMBERHistory of multiple sclerosis. EXAM: CT HEAD WITHOUT CONTRAST CT MAXILLOFACIAL WITHOUT CONTRAST CT CERVICAL SPINE WITHOUT CONTRAST TECHNIQUE: Multidetector CT imaging of the head, cervical spine, and maxillofacial structures were performed using the standard protocol without intravenous contrast. Multiplanar CT image reconstructions of the cervical spine and maxillofacial structures were also generated. COMPARISON:  Brain MRI 03/02/2014.  Head CT 03/01/2014. FINDINGS: CT HEAD FINDINGS Brain: Mild cerebral and cerebellar atrophy. Patchy chronic cerebral white matter disease, nonspecific. There is no acute intracranial hemorrhage. No demarcated cortical infarct. No extra-axial fluid collection. No evidence of intracranial mass. No midline shift. Vascular: No hyperdense vessel. Skull: Normal. Negative for fracture or focal lesion. Other: Left forehead/periorbital hematoma. Trace fluid within the right mastoid air cells. CT MAXILLOFACIAL FINDINGS Osseous: No acute maxillofacial fracture is identified. Anterior subluxation of the mandibular condyles  bilaterally. Cerclage wires within the mandible bilaterally. Orbits: Left periorbital hematoma. No other acute finding. The globes are normal in size and contour. The extraocular muscles and optic nerve sheath complexes are symmetric and unremarkable. Sinuses: Frothy secretions within the left frontal sinus. Mild right frontal sinus mucosal thickening. Mild mucosal thickening within the bilateral ethmoid air cells. Mild mucosal thickening and small mucous retention cysts within the maxillary sinuses bilaterally. Soft tissues: Left forehead/periorbital soft tissue hematoma. CT CERVICAL SPINE FINDINGS Alignment: Straightening of the expected cervical lordosis. 2 mm C5-C6-C7 grade 1 retrolisthesis. Skull base and vertebrae: The basion-dental and atlanto-dental intervals are maintained.Mild age-indeterminate anterior wedge deformity of the C6 vertebral body. Elsewhere, there is no evidence of acute cervical spine fracture. Soft tissues and spinal canal: No prevertebral fluid or swelling. No visible canal hematoma. Disc levels: Cervical spondylosis with multilevel disc space narrowing, disc bulges, posterior disc osteophytes and uncovertebral hypertrophy. At C5-C6, disc space narrowing is moderate in severity and a posterior disc osteophyte complex contributes to bilateral neural foraminal narrowing as well as mild spinal canal stenosis. Upper chest: No consolidation within the imaged lung apices. No visible pneumothorax IMPRESSION: CT head: 1. No evidence of acute intracranial abnormality. 2. Left forehead/periorbital hematoma. 3. Patchy nonspecific  cerebral white matter disease. 4. Mild atrophy of the brain. CT maxillofacial: 1. No evidence of acute maxillofacial fracture. 2. Anterior subluxation of the mandibular condyles may be related to patient positioning at the time of the examination. However, correlate with physical exam findings to exclude TMJ dislocation. 3. Left forehead/periorbital hematoma. 4. Paranasal  sinus disease as described. Correlate for acute sinusitis. CT cervical spine: 1. Mild age-indeterminate anterior wedge deformity of the C6 vertebral body. 2. No evidence of acute fracture to the cervical spine elsewhere. 3. 2 mm C6-C7 grade 1 retrolisthesis. 4. Cervical spondylosis as described and greatest at C6-C7. Electronically Signed   By: Kellie Simmering DO   On: 06/11/2020 14:44   CT CERVICAL SPINE WO CONTRAST  Result Date: 06/11/2020 CLINICAL DATA:  Focal neuro deficit, greater than 6 hours, stroke suspected. Additional history provided: Dizziness and nausea, fall landing face first. Additional history obtained from [place] NUMBERHistory of multiple sclerosis. EXAM: CT HEAD WITHOUT CONTRAST CT MAXILLOFACIAL WITHOUT CONTRAST CT CERVICAL SPINE WITHOUT CONTRAST TECHNIQUE: Multidetector CT imaging of the head, cervical spine, and maxillofacial structures were performed using the standard protocol without intravenous contrast. Multiplanar CT image reconstructions of the cervical spine and maxillofacial structures were also generated. COMPARISON:  Brain MRI 03/02/2014.  Head CT 03/01/2014. FINDINGS: CT HEAD FINDINGS Brain: Mild cerebral and cerebellar atrophy. Patchy chronic cerebral white matter disease, nonspecific. There is no acute intracranial hemorrhage. No demarcated cortical infarct. No extra-axial fluid collection. No evidence of intracranial mass. No midline shift. Vascular: No hyperdense vessel. Skull: Normal. Negative for fracture or focal lesion. Other: Left forehead/periorbital hematoma. Trace fluid within the right mastoid air cells. CT MAXILLOFACIAL FINDINGS Osseous: No acute maxillofacial fracture is identified. Anterior subluxation of the mandibular condyles bilaterally. Cerclage wires within the mandible bilaterally. Orbits: Left periorbital hematoma. No other acute finding. The globes are normal in size and contour. The extraocular muscles and optic nerve sheath complexes are  symmetric and unremarkable. Sinuses: Frothy secretions within the left frontal sinus. Mild right frontal sinus mucosal thickening. Mild mucosal thickening within the bilateral ethmoid air cells. Mild mucosal thickening and small mucous retention cysts within the maxillary sinuses bilaterally. Soft tissues: Left forehead/periorbital soft tissue hematoma. CT CERVICAL SPINE FINDINGS Alignment: Straightening of the expected cervical lordosis. 2 mm C5-C6-C7 grade 1 retrolisthesis. Skull base and vertebrae: The basion-dental and atlanto-dental intervals are maintained.Mild age-indeterminate anterior wedge deformity of the C6 vertebral body. Elsewhere, there is no evidence of acute cervical spine fracture. Soft tissues and spinal canal: No prevertebral fluid or swelling. No visible canal hematoma. Disc levels: Cervical spondylosis with multilevel disc space narrowing, disc bulges, posterior disc osteophytes and uncovertebral hypertrophy. At C5-C6, disc space narrowing is moderate in severity and a posterior disc osteophyte complex contributes to bilateral neural foraminal narrowing as well as mild spinal canal stenosis. Upper chest: No consolidation within the imaged lung apices. No visible pneumothorax IMPRESSION: CT head: 1. No evidence of acute intracranial abnormality. 2. Left forehead/periorbital hematoma. 3. Patchy nonspecific cerebral white matter disease. 4. Mild atrophy of the brain. CT maxillofacial: 1. No evidence of acute maxillofacial fracture. 2. Anterior subluxation of the mandibular condyles may be related to patient positioning at the time of the examination. However, correlate with physical exam findings to exclude TMJ dislocation. 3. Left forehead/periorbital hematoma. 4. Paranasal sinus disease as described. Correlate for acute sinusitis. CT cervical spine: 1. Mild age-indeterminate anterior wedge deformity of the C6 vertebral body. 2. No evidence of acute fracture to the cervical spine elsewhere.  3. 2  mm C6-C7 grade 1 retrolisthesis. 4. Cervical spondylosis as described and greatest at C6-C7. Electronically Signed   By: Kellie Simmering DO   On: 06/11/2020 14:44   CT Maxillofacial Wo Contrast  Result Date: 06/11/2020 CLINICAL DATA:  Focal neuro deficit, greater than 6 hours, stroke suspected. Additional history provided: Dizziness and nausea, fall landing face first. Additional history obtained from [place] NUMBERHistory of multiple sclerosis. EXAM: CT HEAD WITHOUT CONTRAST CT MAXILLOFACIAL WITHOUT CONTRAST CT CERVICAL SPINE WITHOUT CONTRAST TECHNIQUE: Multidetector CT imaging of the head, cervical spine, and maxillofacial structures were performed using the standard protocol without intravenous contrast. Multiplanar CT image reconstructions of the cervical spine and maxillofacial structures were also generated. COMPARISON:  Brain MRI 03/02/2014.  Head CT 03/01/2014. FINDINGS: CT HEAD FINDINGS Brain: Mild cerebral and cerebellar atrophy. Patchy chronic cerebral white matter disease, nonspecific. There is no acute intracranial hemorrhage. No demarcated cortical infarct. No extra-axial fluid collection. No evidence of intracranial mass. No midline shift. Vascular: No hyperdense vessel. Skull: Normal. Negative for fracture or focal lesion. Other: Left forehead/periorbital hematoma. Trace fluid within the right mastoid air cells. CT MAXILLOFACIAL FINDINGS Osseous: No acute maxillofacial fracture is identified. Anterior subluxation of the mandibular condyles bilaterally. Cerclage wires within the mandible bilaterally. Orbits: Left periorbital hematoma. No other acute finding. The globes are normal in size and contour. The extraocular muscles and optic nerve sheath complexes are symmetric and unremarkable. Sinuses: Frothy secretions within the left frontal sinus. Mild right frontal sinus mucosal thickening. Mild mucosal thickening within the bilateral ethmoid air cells. Mild mucosal thickening and small  mucous retention cysts within the maxillary sinuses bilaterally. Soft tissues: Left forehead/periorbital soft tissue hematoma. CT CERVICAL SPINE FINDINGS Alignment: Straightening of the expected cervical lordosis. 2 mm C5-C6-C7 grade 1 retrolisthesis. Skull base and vertebrae: The basion-dental and atlanto-dental intervals are maintained.Mild age-indeterminate anterior wedge deformity of the C6 vertebral body. Elsewhere, there is no evidence of acute cervical spine fracture. Soft tissues and spinal canal: No prevertebral fluid or swelling. No visible canal hematoma. Disc levels: Cervical spondylosis with multilevel disc space narrowing, disc bulges, posterior disc osteophytes and uncovertebral hypertrophy. At C5-C6, disc space narrowing is moderate in severity and a posterior disc osteophyte complex contributes to bilateral neural foraminal narrowing as well as mild spinal canal stenosis. Upper chest: No consolidation within the imaged lung apices. No visible pneumothorax IMPRESSION: CT head: 1. No evidence of acute intracranial abnormality. 2. Left forehead/periorbital hematoma. 3. Patchy nonspecific cerebral white matter disease. 4. Mild atrophy of the brain. CT maxillofacial: 1. No evidence of acute maxillofacial fracture. 2. Anterior subluxation of the mandibular condyles may be related to patient positioning at the time of the examination. However, correlate with physical exam findings to exclude TMJ dislocation. 3. Left forehead/periorbital hematoma. 4. Paranasal sinus disease as described. Correlate for acute sinusitis. CT cervical spine: 1. Mild age-indeterminate anterior wedge deformity of the C6 vertebral body. 2. No evidence of acute fracture to the cervical spine elsewhere. 3. 2 mm C6-C7 grade 1 retrolisthesis. 4. Cervical spondylosis as described and greatest at C6-C7. Electronically Signed   By: Kellie Simmering DO   On: 06/11/2020 14:44    Procedures Procedures (including critical care  time)  Medications Ordered in ED Medications  sodium chloride 0.9 % bolus 500 mL (0 mLs Intravenous Stopped 06/11/20 1401)    Followed by  0.9 %  sodium chloride infusion (has no administration in time range)    ED Course  I have reviewed the  triage vital signs and the nursing notes.  Pertinent labs & imaging results that were available during my care of the patient were reviewed by me and considered in my medical decision making (see chart for details).  Clinical Course as of 06/11/20 1545  Tue Jun 11, 2020  1457 Labs reviewed.  Metabolic panel notable for hyponatremia, this is new compared to previous values. [JK]  1457 Covid and flu test are negative.  White blood cell count is elevated at 13.3. [JK]  1458 CT scan findings reviewed.  No signs of acute intracranial injury.  Reviewed maxillofacial findings and patient clinically does not evidence of mandibular dislocation. [YH]  8887 Discussed findings with the patient.  C-collar removed.  Urinalysis still pending. [JK]    Clinical Course User Index [JK] Dorie Rank, MD   MDM Rules/Calculators/A&P                          Patient fell after having an episode of dizziness at home.  In the ED the patient is hemodynamically stable.  No focal neurologic deficits to suggest stroke.  CT scans did not show any acute abnormalities.  Patient's labs are notable for hyponatremia.  I do not have any recent labs for comparison.  He is not disoriented or confused.  I doubt that this is acutely related to his dizziness but it is a possibility.  Patient has been given IV saline.  Urinalysis is still pending.  We will plan on UA.  Plan on making sure patient is able to ambulate without difficulty.  Dr Dina Rich will follow up on ua and dispo Final Clinical Impression(s) / ED Diagnoses Final diagnoses:  Hyponatremia  Contusion of face, initial encounter    Rx / DC Orders ED Discharge Orders    None       Dorie Rank, MD 06/11/20 1547

## 2020-06-11 NOTE — ED Provider Notes (Signed)
Patient signed out to me by Dr. Tomi Bamberger at shift change pending urinalysis and reevaluation.  Brief this is a 54 year old male with past medical history of MS, he had an episode of lightheadedness at home that resulted in a mechanical fall with facial injury.  No complaint of syncope.  Patient was neurologically intact on arrival, he continues to be neurologically intact on my exam.  Vitals are normal.  CT imaging showed no acute finding.  Blood work showed hyponatremia, no recent labs to compare to.  He has no confusion or other acute symptoms of hyponatremia, he was hydrated with a liter of normal saline.  He states that he feels well and back to baseline.  He is ambulated with a steady and baseline gait, he has no complaints, specifically no neurologic complaints.  I have educated the patient on staying well-hydrated, he knows to follow-up with his primary doctor for repeat blood work in regards to hyponatremia.  He offers no new complaints at this time.  Patient will be discharged and treated as an outpatient.  Discharge plan discussed, patient verbalizes understanding and agreement.   Lorelle Gibbs, DO 06/11/20 1903

## 2020-06-18 ENCOUNTER — Ambulatory Visit: Payer: No Typology Code available for payment source | Admitting: Physical Medicine & Rehabilitation

## 2020-06-18 ENCOUNTER — Encounter: Payer: Medicare Other | Admitting: Physical Medicine & Rehabilitation

## 2020-06-18 ENCOUNTER — Other Ambulatory Visit: Payer: Self-pay

## 2020-07-04 ENCOUNTER — Other Ambulatory Visit: Payer: Self-pay

## 2020-07-04 ENCOUNTER — Encounter
Payer: No Typology Code available for payment source | Attending: Physical Medicine & Rehabilitation | Admitting: Physical Medicine & Rehabilitation

## 2020-07-04 ENCOUNTER — Encounter: Payer: Self-pay | Admitting: Physical Medicine & Rehabilitation

## 2020-07-04 VITALS — BP 141/93 | HR 88 | Temp 98.5°F | Ht 74.0 in | Wt 228.0 lb

## 2020-07-04 DIAGNOSIS — M47817 Spondylosis without myelopathy or radiculopathy, lumbosacral region: Secondary | ICD-10-CM | POA: Diagnosis not present

## 2020-07-04 NOTE — Progress Notes (Signed)
Subjective:    Patient ID: Randy Greene, male    DOB: 05-03-1966, 55 y.o.   MRN: [id number]  HPI 55 year old male with history of multiple sclerosis who returns for follow-up of his pain related to lumbar spondylosis.  He has had excellent results from L3-L4 medial branch L5 dorsal ramus radiofrequency neurotomy under fluoroscopic guidance performed on the right side on 01/23/2020 and on the left side on 02/19/2020. Interval medical history the patient had some cognitive slowing related to Lyrica and the dose was reduced however he started noticing right-sided facial pain.  He saw his dentist and underwent 2 root canals which were not helpful in alleviating the pain.  He saw his neurologist who prescribed Oxley Compazine for trigeminal neuralgia however patient developed hyponatremia and had a fall related to this.  The patient did have a mild concussion and states that he has been more forgetful.  He does feel like he is slowly improving from this. The patient over the last couple weeks has noted some increase in right-sided low back pain.  He also has some buttock pain.  He sees an acupuncturist who thought this may be sciatic pain. Pain Inventory Average Pain 7 Pain Right Now 6 My pain is stabbing  In the last 24 hours, has pain interfered with the following? General activity 7 Relation with others 8 Enjoyment of life 8 What TIME of day is your pain at its worst? evening Sleep (in general) Fair  Pain is worse with: bending and standing Pain improves with: heat/ice and therapy/exercise Relief from Meds: 5  Family History  Problem Relation Age of Onset  . Dementia Mother   . Hypertension Father   . Heart attack Father    Social History   Socioeconomic History  . Marital status: Single    Spouse name: Not on file  . Number of children: Not on file  . Years of education: Not on file  . Highest education level: Not on file  Occupational History  . Not on file  Tobacco Use  .  Smoking status: Former Smoker    Packs/day: 1.00    Years: 20.00    Pack years: 20.00    Types: Cigarettes    Quit date: 11/02/2006    Years since quitting: 13.6  . Smokeless tobacco: Never Used  Vaping Use  . Vaping Use: Never used  Substance and Sexual Activity  . Alcohol use: No  . Drug use: No    Comment: medical marijuana  . Sexual activity: Never  Other Topics Concern  . Not on file  Social History Narrative  . Not on file   Social Determinants of Health   Financial Resource Strain: Not on file  Food Insecurity: Not on file  Transportation Needs: Not on file  Physical Activity: Not on file  Stress: Not on file  Social Connections: Not on file   Past Surgical History:  Procedure Laterality Date  . ARTHROSCOPIC    . HERNIA REPAIR    . INCISION AND DRAINAGE Left 01/04/2017   Procedure: INCISION AND DRAINAGE LEFT KNEE;  Surgeon: Rod Can, MD;  Location: [place];  Service: Orthopedics;  Laterality: Left;  . jawsurgery    . KNEE ARTHROPLASTY Left 08/10/2016   Procedure: LEFT TOTAL KNEE ARTHROPLASTY WITH COMPUTER NAVIGATION;  Surgeon: Rod Can, MD;  Location: [place];  Service: Orthopedics;  Laterality: Left;  . knees scope     Past Surgical History:  Procedure Laterality Date  . ARTHROSCOPIC    .  HERNIA REPAIR    . INCISION AND DRAINAGE Left 01/04/2017   Procedure: INCISION AND DRAINAGE LEFT KNEE;  Surgeon: Rod Can, MD;  Location: [place];  Service: Orthopedics;  Laterality: Left;  . jawsurgery    . KNEE ARTHROPLASTY Left 08/10/2016   Procedure: LEFT TOTAL KNEE ARTHROPLASTY WITH COMPUTER NAVIGATION;  Surgeon: Rod Can, MD;  Location: [place];  Service: Orthopedics;  Laterality: Left;  . knees scope     Past Medical History:  Diagnosis Date  . Acid reflux   . Arthritis   . Aspiration pneumonia ([place])   . Asthma   . Delayed wound healing    left knee  . Dyspnea   . Enlarged prostate   . Hypercapnic respiratory failure, chronic ([place])   .  Hypertension   . MS (multiple sclerosis) ([facility])    BP (!) 141/93   Pulse 88   Temp 98.5 F (36.9 C)   Ht 6\' 2"  (1.88 m)   Wt 228 lb (103.4 kg)   SpO2 98%   BMI 29.27 kg/m   Opioid Risk Score:   Fall Risk Score:  `1  Depression screen PHQ 2/9  Depression screen [facility] 2/9 02/20/2020 10/26/2019 02/21/2019  Decreased Interest 0 0 0  Down, Depressed, Hopeless 0 0 0  PHQ - 2 Score 0 0 0  Altered sleeping - - 0  Tired, decreased energy - - 2  Change in appetite - - 1  Feeling bad or failure about yourself  - - 0  Trouble concentrating - - 2  Moving slowly or fidgety/restless - - 0  Suicidal thoughts - - 0  PHQ-9 Score - - 5  Difficult doing work/chores - - Somewhat difficult    Review of Systems  Musculoskeletal: Positive for back pain.  All other systems reviewed and are negative.      Objective:   Physical Exam Vitals and nursing note reviewed.  Constitutional:      Appearance: He is obese.  HENT:     Head: Normocephalic and atraumatic.  Eyes:     Conjunctiva/sclera: Conjunctivae normal.     Pupils: Pupils are equal, round, and reactive to light.  Musculoskeletal:        General: Tenderness present.     Right lower leg: No edema.     Left lower leg: No edema.     Comments: There is tenderness along the lumbar paraspinals around L4-L5 and S1 on the right side. No tenderness along the left side.  There is reduced range of motion mainly related to balance he needs to hold onto a cane to maintain his balance during lumbar range of motion he has approximately 50% lumbar flexion as well as lumbar extension. He ambulates with a straight cane no evidence of toe drag or knee instability.  He does ambulate with a wider base of support  Skin:    General: Skin is warm and dry.  Neurological:     Mental Status: He is alert and oriented to person, place, and time.     Cranial Nerves: No dysarthria or facial asymmetry.     Gait: Gait abnormal.  Psychiatric:        Mood and Affect:  Mood normal.        Behavior: Behavior normal.        Thought Content: Thought content normal.        Judgment: Judgment normal.           Assessment & Plan:  #1.  Lumbar spondylosis  without myelopathy with good results following radiofrequency neurotomy L3 and L4 medial branches as well as L5 dorsal ramus performed on the right side on 01/23/2020 and on the left side on 02/19/2020.  As expected the right side symptoms are starting to return.  We will schedule repeat radiofrequency neurotomy on the right and L3-L4 medial branches and L5 dorsal ramus in approximately 4 weeks.  We will see how his left side is feeling at that time and decide whether retreatment on that side is needed as well. 2.  Multiple sclerosis with trigeminal neuralgia patient follow-up with neurology

## 2020-07-04 NOTE — Patient Instructions (Signed)
Repeat radiofreq Right sid enext month and determine whether the left side needs to be repeated

## 2020-08-06 ENCOUNTER — Other Ambulatory Visit: Payer: Self-pay

## 2020-08-06 ENCOUNTER — Encounter: Payer: Self-pay | Admitting: Physical Medicine & Rehabilitation

## 2020-08-06 ENCOUNTER — Encounter
Payer: No Typology Code available for payment source | Attending: Physical Medicine & Rehabilitation | Admitting: Physical Medicine & Rehabilitation

## 2020-08-06 VITALS — BP 134/95 | HR 91 | Temp 98.0°F | Ht 74.0 in | Wt 232.0 lb

## 2020-08-06 DIAGNOSIS — M47817 Spondylosis without myelopathy or radiculopathy, lumbosacral region: Secondary | ICD-10-CM | POA: Insufficient documentation

## 2020-08-06 NOTE — Progress Notes (Signed)
  PROCEDURE RECORD [facility]   Name: Randy Greene DOB:11-08-1965 MRN: [id number]  Date:08/06/2020  Physician: Alysia Penna, MD    Nurse/CMA: Jerson Furukawa, CMA  Allergies:  Allergies  Allergen Reactions  . Penicillins Hives and Other (See Comments)    [ +FAMILY HISTORY] Has patient had a PCN reaction causing immediate rash, facial/tongue/throat swelling, SOB or lightheadedness with hypotension: Unknown Has patient had a PCN reaction causing severe rash involving mucus membranes or skin necrosis: Unknown Has patient had a PCN reaction that required hospitalization: No Has patient had a PCN reaction occurring within the last 10 years: No If all of the above answers are "NO", then may proceed with Cephalosporin use.    . Food Nausea And Vomiting and Other (See Comments)    GREEN PEPPERS- flushed  PT DOES NOT EAT MEAT  . Mushroom Extract Complex Nausea And Vomiting and Other (See Comments)    And flushed  . Adhesive [Tape] Rash  . Morphine And Related Itching    Consent Signed: Yes.    Is patient diabetic? No.  CBG today?   Pregnant: No. LMP: No LMP for male patient. (age 70-55)  Anticoagulants: no Anti-inflammatory: no Antibiotics: no  Procedure: right Lumbar 3,4,5 radiofrequency neurotomy Position: Prone Start Time: 1:25pm       End Time: 1:41pm  Fluoro Time: 32s  RN/CMA Chrisean Kloth, CMA Ely Spragg, CMA    Time 1:00pm 1:46pm    BP 134/95 122/82    Pulse 91 86    Respirations 16 16    O2 Sat 97 98    S/S 6 6    Pain Level 5/10 0/10     D/C home with self, patient A & O X 3, D/C instructions reviewed, and sits independently.

## 2020-08-06 NOTE — Patient Instructions (Signed)
You had a radio frequency procedure today This was done to alleviate joint pain in your lumbar area We injected lidocaine which is a local anesthetic.  You may experience soreness at the injection sites. You may also experienced some irritation of the nerves that were heated I'm recommending ice for 30 minutes every 2 hours as needed for the next 24-48 hours   

## 2020-08-06 NOTE — Progress Notes (Signed)

## 2020-09-03 ENCOUNTER — Encounter: Payer: Self-pay | Admitting: Physical Medicine & Rehabilitation

## 2020-09-03 ENCOUNTER — Encounter
Payer: No Typology Code available for payment source | Attending: Physical Medicine & Rehabilitation | Admitting: Physical Medicine & Rehabilitation

## 2020-09-03 ENCOUNTER — Other Ambulatory Visit: Payer: Self-pay

## 2020-09-03 VITALS — BP 114/70 | HR 104 | Temp 98.9°F | Ht 74.0 in | Wt 227.0 lb

## 2020-09-03 DIAGNOSIS — M47817 Spondylosis without myelopathy or radiculopathy, lumbosacral region: Secondary | ICD-10-CM | POA: Diagnosis not present

## 2020-09-03 NOTE — Progress Notes (Signed)
Left L5 dorsal ramus., left L4 and left L3 medial branch radio frequency neurotomy under fluoroscopic guidance  Indication: Low back pain due to lumbar spondylosis which has been relieved on 2 occasions by greater than 50% by lumbar medial branch blocks at corresponding levels.  Informed consent was obtained after describing risks and benefits of the procedure with the patient, this includes bleeding, bruising, infection, paralysis and medication side effects. The patient wishes to proceed and has given written consent. The patient was placed in a prone position. The lumbar and sacral area was marked and prepped with Betadine. A 25-gauge 1-1/2 inch needle was inserted into the skin and subcutaneous tissue at 3 sites in one ML of 2% lidocaine was injected into each site. Then a 18-gauge 10 cm radio frequency needle with a 1 cm curved active tip was inserted targeting the left S1 SAP/sacral ala junction. Bone contact was made and confirmed with lateral imaging.  motor stimulation at 2 Hz confirm proper needle location followed by injection of one ML of 2% MPF lidocaine. Then the left L5 SAP/transverse process junction was targeted. Bone contact was made and confirmed with lateral imaging motor stimulation at 2 Hz confirm proper needle location followed by injection of one ML of the solution containing one ML of  2% MPF lidocaine. Then the left L4 SAP/transverse process junction was targeted. Bone contact was made and confirmed with lateral imaging. motor stimulation at 2 Hz confirm proper needle location followed by injection of one ML of the solution containing one ML of2% MPF lidocaine. Radio frequency lesion  at [facility] for 90 seconds was performed. Needles were removed. Post procedure instructions and vital signs were performed. Patient tolerated procedure well. Followup appointment was given.

## 2020-09-03 NOTE — Patient Instructions (Signed)
You had a radio frequency procedure today This was done to alleviate joint pain in your lumbar area We injected lidocaine which is a local anesthetic.  You may experience soreness at the injection sites. You may also experienced some irritation of the nerves that were heated I'm recommending ice for 30 minutes every 2 hours as needed for the next 24-48 hours   

## 2020-09-03 NOTE — Progress Notes (Signed)
  PROCEDURE RECORD [facility]   Name: Randy Greene DOB:[birth year] MRN: [id number]  Date:09/03/2020  Physician: Alysia Penna, MD    Nurse/CMA: Jorja Loa MA  Allergies:  Allergies  Allergen Reactions  . Penicillins Hives and Other (See Comments)    [ +FAMILY HISTORY] Has patient had a PCN reaction causing immediate rash, facial/tongue/throat swelling, SOB or lightheadedness with hypotension: Unknown Has patient had a PCN reaction causing severe rash involving mucus membranes or skin necrosis: Unknown Has patient had a PCN reaction that required hospitalization: No Has patient had a PCN reaction occurring within the last 10 years: No If all of the above answers are "NO", then may proceed with Cephalosporin use.    . Food Nausea And Vomiting and Other (See Comments)    GREEN PEPPERS- flushed  PT DOES NOT EAT MEAT  . Mushroom Extract Complex Nausea And Vomiting and Other (See Comments)    And flushed  . Adhesive [Tape] Rash  . Morphine And Related Itching    Consent Signed: No.  Is patient diabetic? No.  CBG today? N/A Pregnant: No. LMP: No LMP for male patient. (age 6-55)  Anticoagulants: no Anti-inflammatory: no Antibiotics: no  Procedure: Left L3,4,5 radiofrequency Position: Prone Start Time: 2:37 pm End Time: 2:15 pm Fluoro Time:43  RN/CMA Briyonna Omara MA Raymonde Hamblin MA    Time 2:09 pm 2:53pm    BP 114/70 124/87    Pulse 104 80    Respirations 14 14    O2 Sat 98 96    S/S 6 6    Pain Level 7/10 5/10     D/C home with Self , patient A & O X 3, D/C instructions reviewed, and sits independently.

## 2020-10-22 ENCOUNTER — Other Ambulatory Visit: Payer: Self-pay

## 2020-10-22 ENCOUNTER — Ambulatory Visit (INDEPENDENT_AMBULATORY_CARE_PROVIDER_SITE_OTHER): Payer: No Typology Code available for payment source | Admitting: Emergency Medicine

## 2020-10-22 ENCOUNTER — Encounter: Payer: Self-pay | Admitting: Emergency Medicine

## 2020-10-22 VITALS — BP 132/80 | HR 93 | Temp 97.9°F | Ht 74.0 in | Wt 222.6 lb

## 2020-10-22 DIAGNOSIS — Z9989 Dependence on other enabling machines and devices: Secondary | ICD-10-CM

## 2020-10-22 DIAGNOSIS — R911 Solitary pulmonary nodule: Secondary | ICD-10-CM | POA: Diagnosis not present

## 2020-10-22 DIAGNOSIS — R9389 Abnormal findings on diagnostic imaging of other specified body structures: Secondary | ICD-10-CM

## 2020-10-22 DIAGNOSIS — R0600 Dyspnea, unspecified: Secondary | ICD-10-CM | POA: Insufficient documentation

## 2020-10-22 DIAGNOSIS — R0602 Shortness of breath: Secondary | ICD-10-CM | POA: Diagnosis not present

## 2020-10-22 DIAGNOSIS — G4733 Obstructive sleep apnea (adult) (pediatric): Secondary | ICD-10-CM

## 2020-10-22 DIAGNOSIS — K219 Gastro-esophageal reflux disease without esophagitis: Secondary | ICD-10-CM | POA: Diagnosis not present

## 2020-10-22 LAB — BASIC METABOLIC PANEL
BUN: 14 mg/dL (ref 6–23)
CO2: 26 mEq/L (ref 19–32)
Calcium: 9.3 mg/dL (ref 8.4–10.5)
Chloride: 103 mEq/L (ref 96–112)
Creatinine, Ser: 0.83 mg/dL (ref 0.40–1.50)
GFR: 97.9 mL/min (ref >60.00)
Glucose, Bld: 107 mg/dL — ABNORMAL HIGH (ref 70–99)
Potassium: 3.8 mEq/L (ref 3.5–5.1)
Sodium: 138 mEq/L (ref 135–145)

## 2020-10-22 NOTE — Assessment & Plan Note (Signed)
PPI, H2 blockade, wedge pillow and aspiration precautions

## 2020-10-22 NOTE — Assessment & Plan Note (Signed)
Much improved since he came off Xanax, has worked hard to improve his GERD, aspiration risk factors.  Question whether the area of abnormality in his left lung with micronodular change, groundglass change may be related to recurrent aspiration event.  Consider also chronic changes that set him up for opportunistic infection.  Work-up as below

## 2020-10-22 NOTE — Assessment & Plan Note (Signed)
Uses reliably with good clinical benefit.  Managed by the [facility]

## 2020-10-22 NOTE — Assessment & Plan Note (Signed)
Likely multifactorial including contribution of his MS, but he does carry history of asthma including childhood asthma, also tobacco use.  One of his main complaints is wheezing.  He does get benefit from albuterol.  Suspect he does have some degree of obstructive lung disease, not yet quantified.  Needs pulmonary function testing, probably maintenance bronchodilator therapy.  He had thrush with Symbicort in the past.  We will plan for PFT, continue the albuterol for now and plan next steps based on results

## 2020-10-22 NOTE — Assessment & Plan Note (Signed)
Some scattered groundglass with associated poorly formed nodules left upper lobe, left lower lobe largest 1 cm.  Question opportunistic infection, acute inflammation-he is at risk due to his history of MS and aspiration.  He is not making any sputum so unable to collect culture data at this time plan to repeat his CT now to look for persistence.  If resolved then suspect this was transient and inflammatory.  If changes persist or progress then we will advance the work-up, consider bronchoscopy for culture data, possible tissue data.  He will follow-up after a CT with contrast

## 2020-10-22 NOTE — Progress Notes (Signed)
Subjective:    Patient ID: Randy Greene, male    DOB: [birth year], 55 y.o.   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  Carries a history of asthma with [facility] ([facility] S[facility]), also allergic rhinitis on cetirizine, Atrovent nasal spray but doesn' t use frequently, Singulair. Also has flonase that he never uses. His aspiration issues are much improved with rx of his GERD and d/c xanax.   Seen at the [place] he was seen at the [facility] 08/26/2020 at which time he was having increased wheeze for 2 to 3 months, stable cough and dyspnea.  Pulmonary function testing was obtained, unavailable to me at this time.  He also had a CT chest that was done on 09/12/2020 as below. He has albuterol that does seem to help him with exertional SOB, also some SOB at rest. Seemed to correspond with increased allergy season. Minimal cough. He hears wheeze daily for the last month - may be a bit better lately. Does respond to albuterol. No pred or abx since his aspiration improved.   Chest CT 09/12/2020 reviewed by me, shows small hiatal hernia, calcified granulomata in the medial right lower lobe, right hilar nodes as well as the spleen, some peribronchovascular nodular and groundglass opacity in the left upper and lower lobes, largest focus up to 10 mm, question some acute multifocal inflammatory or infectious process.  No effusions.  No other concerning nodules   Review of Systems As per HPi  Past Medical History:  Diagnosis Date  . Acid reflux   . Arthritis   . Aspiration pneumonia ([place])   . Asthma   . Delayed wound healing     left knee  . Dyspnea   . Enlarged prostate   . Hypercapnic respiratory failure, chronic ([place])   . Hypertension   . MS (multiple sclerosis) ([place])      Family History  Problem Relation Age of Onset  . Dementia Mother   . Hypertension Father   . Heart attack Father      Social History   Socioeconomic History  . Marital status: Single    Spouse name: Not on file  . Number of children: Not on file  . Years of education: Not on file  . Highest education level: Not on file  Occupational History  . Not on file  Tobacco Use  . Smoking status: Former Smoker    Packs/day: 1.00    Years: 20.00    Pack years: 20.00    Types: Cigarettes    Quit date: 11/02/2006    Years since quitting: 13.9  . Smokeless tobacco: Never Used  Vaping Use  . Vaping Use: Never used  Substance and Sexual Activity  . Alcohol use: No  . Drug use: No    Comment: medical marijuana  . Sexual activity: Never  Other Topics Concern  . Not on file  Social History Narrative  . Not on file   Social Determinants of Health   Financial Resource Strain: Not on file  Food Insecurity: Not on file  Transportation Needs: Not on file  Physical Activity: Not on file  Stress: Not on file  Social Connections: Not  on file  Intimate Partner Violence: Not on file    Was in the [employer], computer science, [place] Has lived in [place], [place].  Worked for [employer], [employer].  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   Allergies  Allergen Reactions  . Penicillins Hives and Other (See Comments)    [ +FAMILY HISTORY] Has patient had a PCN reaction causing immediate rash, facial/tongue/throat swelling, SOB or lightheadedness with hypotension: Unknown Has patient had a PCN reaction causing severe rash involving mucus membranes or skin necrosis: Unknown Has patient had a PCN reaction that required hospitalization: No Has patient had a PCN reaction occurring within the last 10 years: No If all of  the above answers are "NO", then may proceed with Cephalosporin use.    . Tape Rash  . Zoloft [Sertraline] Rash  . Food Nausea And Vomiting and Other (See Comments)    GREEN PEPPERS- flushed  PT DOES NOT EAT MEAT  . Morphine Itching  . Mushroom Extract Complex Nausea And Vomiting and Other (See Comments)    And flushed  . Morphine And Related Itching  . Penicillin G Rash     Outpatient Medications Prior to Visit  Medication Sig Dispense Refill  . Acetaminophen-Codeine 300-30 MG tablet TAKE 1 TABLET BY MOUTH TWICE A DAY AS NEEDED (DO NOT TAKE WITH TRAMADOL)    . albuterol (PROVENTIL HFA;VENTOLIN HFA) 108 (90 BASE) MCG/ACT inhaler Inhale 2 puffs into the lungs every 6 (six) hours as needed for wheezing or shortness of breath.    . alfuzosin (UROXATRAL) 10 MG 24 hr tablet Take 10 mg by mouth daily with breakfast.    . amLODipine (NORVASC) 10 MG tablet Take 5 mg by mouth at bedtime.    Marland Kitchen aspirin EC 81 MG tablet Take 81 mg by mouth at bedtime. Swallow whole.    . baclofen (LIORESAL) 10 MG tablet Take 15 mg by mouth 4 (four) times daily.     . Calcium Carbonate 500 (200 Ca) MG WAFR Take 1 tablet by mouth in the morning and at bedtime.    . cetirizine (ZYRTEC) 10 MG tablet Take 10 mg by mouth daily.    . Cholecalciferol (VITAMIN D) 50 MCG (2000 UT) tablet Take 2,000 Units by mouth daily.    . cyanocobalamin (,VITAMIN B-12,) 1000 MCG/ML injection Inject 1,000 mcg into the muscle once.    . diclofenac Sodium (VOLTAREN) 1 % GEL APPLY SMALL AMOUNT TO AFFECTED AREA TWICE A DAY AS NEEDED FOR PAIN (APPROVED) ###    . dronabinol (MARINOL) 2.5 MG capsule Take 7.5 mg by mouth 3 (three) times daily.     Marland Kitchen esomeprazole (NEXIUM) 40 MG capsule Take 40 mg by mouth 2 (two) times daily before a meal.    . famotidine (PEPCID) 40 MG tablet Take 40 mg by mouth at bedtime.    . finasteride (PROSCAR) 5 MG tablet Take 5 mg by mouth daily.    . Ibuprofen-Acetaminophen (ADVIL DUAL ACTION PO) Take 1 tablet by mouth  daily as needed (pain).    . indomethacin (INDOCIN) 50 MG capsule Take 50 mg by mouth 3 (three) times daily as needed (gout).    Marland Kitchen ipratropium (ATROVENT) 0.03 % nasal spray SPRAY 2 SPRAYS INTO EACH NOSTRIL TWICE A DAY    . ketotifen (ZADITOR) 0.025 % ophthalmic solution Place 1 drop into both eyes 2 (two) times daily as needed (itching).    Marland Kitchen losartan (COZAAR) 100 MG tablet Take 50 mg by  mouth at bedtime.    . magnesium (MAGTAB) 84 MG (7MEQ) TBCR SR tablet Take 42 mg by mouth 2 (two) times daily.    . methylphenidate 36 MG PO CR tablet Take 36 mg by mouth See admin instructions. Take 36 mg by mouth every morning. May also take 36 mg in the afternoon as needed for energy.    . metoCLOPramide (REGLAN) 10 MG tablet Take 10 mg by mouth daily with supper.    . montelukast (SINGULAIR) 10 MG tablet Take 10 mg by mouth daily.    . naloxone (NARCAN) nasal spray 4 mg/0.1 mL SPRAY 1 SPRAY INTO ONE NOSTRIL AS DIRECTED FOR OPIOID OVERDOSE (TURN PERSON ON SIDE AFTER DOSE. IF NO RESPONSE IN 2-3 MINUTES OR PERSON RESPONDS BUT RELAPSES, REPEAT USING A NEW SPRAY DEVICE AND SPRAY INTO THE OTHER NOSTRIL. CALL 911 AFTER USE.) * EMERGENCY USE ONLY *    . Omega-3 Fatty Acids (FISH OIL PO) Take 1 capsule by mouth daily.    . ondansetron (ZOFRAN) 4 MG tablet Take 4 mg by mouth every 8 (eight) hours as needed for nausea or vomiting.    Marland Kitchen oxcarbazepine (TRILEPTAL) 600 MG tablet Take 600 mg by mouth 2 (two) times daily.    . phenytoin (DILANTIN) 100 MG ER capsule TAKE ONE CAPSULE BY MOUTH IN THE MORNING AND EVENING TO REDUCE FACIAL PAIN    . pregabalin (LYRICA) 150 MG capsule Take 150 mg by mouth 3 (three) times daily.    . Probiotic Product (ALIGN) 4 MG CAPS Take 4 mg by mouth in the morning and at bedtime.    . sildenafil (VIAGRA) 100 MG tablet Take 100 mg by mouth daily as needed for erectile dysfunction.    . simvastatin (ZOCOR) 10 MG tablet Take 10 mg by mouth at bedtime.    . sodium chloride 1 g tablet Take 1 tablet by  mouth daily.    . traMADol (ULTRAM) 50 MG tablet Take 50 mg by mouth every 6 (six) hours as needed for moderate pain or severe pain.    . vitamin C (ASCORBIC ACID) 500 MG tablet Take 500 mg by mouth daily.    . vitamin E 180 MG (400 UNITS) capsule Take 400 Units by mouth daily.    . Zinc 50 MG TABS Take 50 mg by mouth every other day.    . albuterol (PROVENTIL) (2.5 MG/3ML) 0.083% nebulizer solution Take 2.5 mg by nebulization 4 (four) times daily as needed for wheezing.    . cefUROXime (CEFTIN) 250 MG tablet Take 250 mg by mouth 2 (two) times daily with a meal.    . cefUROXime (CEFTIN) 250 MG tablet Take 1 tablet by mouth 2 (two) times daily.    . diphenhydrAMINE (BENADRYL) 25 mg capsule Take 25 mg by mouth every 6 (six) hours as needed for allergies.     No facility-administered medications prior to visit.         Objective:   Physical Exam Vitals:   10/22/20 0926  BP: 132/80  Pulse: 93  Temp: 97.9 F (36.6 C)  TempSrc: Temporal  SpO2: 98%  Weight: 222 lb 9.6 oz (101 kg)  Height: 6\' 2"  (1.88 m)   Gen: Pleasant, well-nourished, in no distress,  normal affect  ENT: No lesions,  mouth clear with some mild white secretions tongue and posterior pharynx,  oropharynx clear, no postnasal drip, hearing aid  Neck: No JVD, no stridor  Lungs: No use of accessory muscles, no crackles or wheezing on  normal respiration, no wheeze on forced expiration  Cardiovascular: Distant RRR, heart sounds normal, no murmur or gallops, no peripheral edema  Musculoskeletal: No deformities, no cyanosis or clubbing  Neuro: alert, awake, non focal  Skin: Warm, no lesions or rash      Assessment & Plan:  OSA on CPAP Uses reliably with good clinical benefit.  [facility]  Recurrent aspiration bronchitis/pneumonia ([place]) Much improved since he came off Xanax, has worked hard to improve his GERD, aspiration risk factors.  Question whether the area of abnormality in his left lung with  micronodular change, groundglass change may be related to recurrent aspiration event.  Consider also chronic changes that set him up for opportunistic infection.  Work-up as below  Acid reflux PPI, H2 blockade, wedge pillow and aspiration precautions  Dyspnea Likely multifactorial including contribution of his MS, but he does carry history of asthma including childhood asthma, also tobacco use.  One of his main complaints is wheezing.  He does get benefit from albuterol.  Suspect he does have some degree of obstructive lung disease, not yet quantified.  Needs pulmonary function testing, probably maintenance bronchodilator therapy.  He had thrush with Symbicort in the past.  We will plan for PFT, continue the albuterol for now and plan next steps based on results  Abnormal CT of the chest Some scattered groundglass with associated poorly formed nodules left upper lobe, left lower lobe largest 1 cm.  Question opportunistic infection, acute inflammation-he is at risk due to his history of MS and aspiration.  He is not making any sputum so unable to collect culture data at this time plan to repeat his CT now to look for persistence.  If resolved then suspect this was transient and inflammatory.  If changes persist or progress then we will advance the work-up, consider bronchoscopy for culture data, possible tissue data.  He will follow-up after a CT with contrast   Baltazar Apo, MD, PhD 10/22/2020, 10:17 AM [facility] [phone] or if no answer before 7:00PM call [phone] For any issues after 7:00PM please call eLink [phone]

## 2020-10-22 NOTE — Patient Instructions (Addendum)
Perform pulmonary function testing in next office visit We will repeat your CT scan of the chest to compare with your scan from March.  Depending on the results we will decide if any testing is required to evaluate some inflammatory changes in your left lung Keep your albuterol available use 2 puffs if needed for shortness of breath, chest tightness, wheezing.  Depending on your pulmonary function testing we may decide to add other inhaler medication at your next visit. Continue Singulair and cetirizine (Zyrtec). Try taking your fluticasone (Flonase) nasal spray, 2 sprays each nostril once daily every day through the spring allergy season Agree with Nexium, Pepcid, wedge pillow and your aspiration precautions Follow with Dr. Lamonte Sakai next available with full pulmonary function testing on the same day.

## 2020-10-30 ENCOUNTER — Ambulatory Visit (INDEPENDENT_AMBULATORY_CARE_PROVIDER_SITE_OTHER)
Admission: RE | Admit: 2020-10-30 | Discharge: 2020-10-30 | Disposition: A | Discharge status: Home / Self Care | Payer: No Typology Code available for payment source | Source: Ambulatory Visit | Attending: Emergency Medicine | Admitting: Emergency Medicine

## 2020-10-30 ENCOUNTER — Other Ambulatory Visit: Payer: Self-pay

## 2020-10-30 DIAGNOSIS — R911 Solitary pulmonary nodule: Secondary | ICD-10-CM

## 2020-10-30 MED ORDER — IOHEXOL 300 MG/ML  SOLN
80.0000 mL | Freq: Once | INTRAMUSCULAR | Status: AC | PRN
  Administered 2020-10-30: 80 mL via INTRAVENOUS

## 2020-11-25 ENCOUNTER — Other Ambulatory Visit (HOSPITAL_COMMUNITY)
Admission: RE | Admit: 2020-11-25 | Discharge: 2020-11-25 | Disposition: A | Discharge status: Home / Self Care | Payer: No Typology Code available for payment source | Source: Ambulatory Visit | Attending: Emergency Medicine | Admitting: Emergency Medicine

## 2020-11-25 DIAGNOSIS — Z01812 Encounter for preprocedural laboratory examination: Secondary | ICD-10-CM | POA: Insufficient documentation

## 2020-11-25 DIAGNOSIS — Z20822 Contact with and (suspected) exposure to covid-19: Secondary | ICD-10-CM | POA: Insufficient documentation

## 2020-11-25 LAB — SARS CORONAVIRUS 2 (TAT 6-24 HRS): SARS Coronavirus 2: NEGATIVE

## 2020-11-27 ENCOUNTER — Encounter: Payer: Self-pay | Admitting: Emergency Medicine

## 2020-11-27 ENCOUNTER — Other Ambulatory Visit: Payer: Self-pay

## 2020-11-27 ENCOUNTER — Ambulatory Visit (INDEPENDENT_AMBULATORY_CARE_PROVIDER_SITE_OTHER): Payer: No Typology Code available for payment source | Admitting: Emergency Medicine

## 2020-11-27 DIAGNOSIS — R0602 Shortness of breath: Secondary | ICD-10-CM

## 2020-11-27 DIAGNOSIS — R911 Solitary pulmonary nodule: Secondary | ICD-10-CM | POA: Diagnosis not present

## 2020-11-27 DIAGNOSIS — R9389 Abnormal findings on diagnostic imaging of other specified body structures: Secondary | ICD-10-CM | POA: Diagnosis not present

## 2020-11-27 LAB — PULMONARY FUNCTION TEST
DL/VA % pred: 95 %
DL/VA: 4.07 ml/min/mmHg/L
DLCO cor % pred: 98 %
DLCO cor: 30.49 ml/min/mmHg
DLCO unc % pred: 98 %
DLCO unc: 30.49 ml/min/mmHg
FEF 25-75 Post: 2.39 L/sec
FEF 25-75 Pre: 4.03 L/sec
FEF2575-%Change-Post: -40 %
FEF2575-%Pred-Post: 68 %
FEF2575-%Pred-Pre: 116 %
FEV1-%Change-Post: -9 %
FEV1-%Pred-Post: 83 %
FEV1-%Pred-Pre: 92 %
FEV1-Post: 3.45 L
FEV1-Pre: 3.81 L
FEV1FVC-%Change-Post: 4 %
FEV1FVC-%Pred-Pre: 106 %
FEV6-%Change-Post: -12 %
FEV6-%Pred-Post: 77 %
FEV6-%Pred-Pre: 88 %
FEV6-Post: 4.02 L
FEV6-Pre: 4.59 L
FEV6FVC-%Change-Post: 0 %
FEV6FVC-%Pred-Post: 103 %
FEV6FVC-%Pred-Pre: 104 %
FVC-%Change-Post: -13 %
FVC-%Pred-Post: 74 %
FVC-%Pred-Pre: 86 %
FVC-Post: 4.04 L
FVC-Pre: 4.67 L
Post FEV1/FVC ratio: 86 %
Post FEV6/FVC ratio: 100 %
Pre FEV1/FVC ratio: 82 %
Pre FEV6/FVC Ratio: 100 %
RV % pred: 98 %
RV: 2.29 L
TLC % pred: 94 %
TLC: 7.19 L

## 2020-11-27 NOTE — Assessment & Plan Note (Signed)
PFT overall reassuring without any definitive obstruction although there is some curve to his flow volume loop.  He gets clinical benefit from albuterol, treats wheezing and dyspnea.  I think he can continue to do so.  I will defer scheduled bronchodilator therapy or an ICS for now.  Swallowing precautions, aspiration precautions.  Control GERD and rhinitis.

## 2020-11-27 NOTE — Assessment & Plan Note (Signed)
CT scan of the chest reviewed, stable compared with March 2022.  There is a left upper lobe inflammatory focus, there are other small scattered pulmonary nodules that will need to be followed.  That scan in August.  If stable at that time then we will follow at a water interval.

## 2020-11-27 NOTE — Progress Notes (Signed)
Subjective:    Patient ID: Randy Greene, male    DOB: 01-22-1966, 55 y.o.   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  Carries a history of asthma with [facility] P[facility] ([facility] Symbicort), also allergic rhinitis on cetirizine, Atrovent nasal spray but doesn' t use frequently, Singulair. Also has flonase that he never uses. His aspiration issues are much improved with rx of his GERD and d/c xanax.   Seen at the [place] he was seen at the [facility] 08/26/2020 at which time he was having increased wheeze for 2 to 3 months, stable cough and dyspnea.  Pulmonary function testing was obtained, unavailable to me at this time.  He also had a CT chest that was done on 09/12/2020 as below. He has albuterol that does seem to help him with exertional SOB, also some SOB at rest. Seemed to correspond with increased allergy season. Minimal cough. He hears wheeze daily for the last month - may be a bit better lately. Does respond to albuterol. No pred or abx since his aspiration improved.   Chest CT 09/12/2020 reviewed by me, shows small hiatal hernia, calcified granulomata in the medial right lower lobe, right hilar nodes as well as the spleen, some peribronchovascular nodular and groundglass opacity in the left upper and lower lobes, largest focus up to 10 mm, question some acute multifocal inflammatory or infectious process.  No effusions.  No other concerning nodules   ROV 11/27/20 --follow-up visit 55 year old gentleman, former smoker with a history of childhood asthma, MS with chronic aspiration, OSA on CPAP, GERD.  Also with an abnormal CT  with some scattered bilateral groundglass nodularity done 09/12/2020.  He has intermittent wheezing, benefits from albuterol. He activity is limited by his MS, ? Possibly also his breathing. He does still have some wheeze and SOB with exertion. He uses albuterol intermittently which does help him.  A lot of variability in the frequency.   Pulmonary function testing done today reviewed by me, shows restriction without a bronchodilator response, normal lung volumes, normal diffusion capacity.  Possibly small amount of curve to his flow volume loop that could suggest mild obstruction.  CT scan of the chest 10/30/2020 reviewed by me, shows a vague 2.5 mm subpleural right milligrams as pulmonary nodule, question lymph node.  Also some left upper lobe airspace opacity that appears to be postinflammatory, likely stable compared with 09/12/2020.    Review of Systems As per HPi      Objective:   Physical Exam Vitals:   11/27/20 1109  BP: 130/80  Pulse: 86  Temp: 98.2 F (36.8 C)  TempSrc: Temporal  SpO2: 95%  Weight: 224 lb 9.6 oz (101.9 kg)  Height: 6\' 1"  (1.854 m)   Gen: Pleasant, well-nourished, in no distress,  normal affect  ENT: No lesions,  mouth clear with some mild white secretions tongue and posterior pharynx,  oropharynx clear, no postnasal drip, hearing aid  Neck: No JVD, no stridor  Lungs: No use of accessory muscles, no crackles or wheezing on normal respiration, no wheeze on forced expiration  Cardiovascular: Distant RRR, heart sounds normal, no murmur or gallops, no peripheral edema  Musculoskeletal: No deformities,  no cyanosis or clubbing  Neuro: alert, awake, non focal  Skin: Warm, no lesions or rash      Assessment & Plan:  Abnormal CT of the chest CT scan of the chest reviewed, stable compared with March 2022.  There is a left upper lobe inflammatory focus, there are other small scattered pulmonary nodules that will need to be followed.  That scan in August.  If  stable at that time then we will follow at a water interval.  Dyspnea PFT overall reassuring without any definitive obstruction although there is some curve to his flow volume loop.  He gets clinical benefit from albuterol, treats wheezing and dyspnea.  I think he can continue to do so.  I will defer scheduled bronchodilator therapy or an ICS for now.  Swallowing precautions, aspiration precautions.  Control GERD and rhinitis.   Randy Apo, MD, PhD 11/27/2020, 11:30 AM [facility] [phone] or if no answer before 7:00PM call [phone] For any issues after 7:00PM please call eLink [phone]

## 2020-11-27 NOTE — Addendum Note (Signed)
Addended by: Lia Foyer R on: 11/27/2020 11:36 AM   Modules accepted: Orders

## 2020-11-27 NOTE — Progress Notes (Signed)
PFT done today. 

## 2020-11-27 NOTE — Patient Instructions (Signed)
Please continue to keep your albuterol available to use 2 puffs if needed for shortness of breath, chest tightness, wheezing. We will plan to repeat your CT scan of the chest in August 2022 to follow for stability Follow Dr. Lamonte Sakai in August after your CT scan so that we can review the results together.

## 2020-12-16 ENCOUNTER — Ambulatory Visit (HOSPITAL_COMMUNITY)
Admission: EM | Admit: 2020-12-16 | Discharge: 2020-12-16 | Disposition: A | Discharge status: Home / Self Care | Payer: Medicare Other | Attending: Emergency Medicine | Admitting: Emergency Medicine

## 2020-12-16 ENCOUNTER — Encounter (HOSPITAL_COMMUNITY): Payer: Self-pay

## 2020-12-16 DIAGNOSIS — U071 COVID-19: Secondary | ICD-10-CM | POA: Diagnosis not present

## 2020-12-16 LAB — COMPREHENSIVE METABOLIC PANEL
ALT: 25 U/L (ref 0–44)
AST: 29 U/L (ref 15–41)
Albumin: 4.2 g/dL (ref 3.5–5.0)
Alkaline Phosphatase: 86 U/L (ref 38–126)
Anion gap: 11 (ref 5–15)
BUN: 6 mg/dL (ref 6–20)
CO2: 25 mmol/L (ref 22–32)
Calcium: 9.5 mg/dL (ref 8.9–10.3)
Chloride: 100 mmol/L (ref 98–111)
Creatinine, Ser: 0.96 mg/dL (ref 0.61–1.24)
GFR, Estimated: >60 mL/min (ref >60)
Glucose, Bld: 102 mg/dL — ABNORMAL HIGH (ref 70–99)
Potassium: 3.8 mmol/L (ref 3.5–5.1)
Sodium: 136 mmol/L (ref 135–145)
Total Bilirubin: 0.3 mg/dL (ref 0.3–1.2)
Total Protein: 7.5 g/dL (ref 6.5–8.1)

## 2020-12-16 LAB — CBC WITH DIFFERENTIAL/PLATELET
Abs Immature Granulocytes: 0.02 10*3/uL (ref 0.00–0.07)
Basophils Absolute: 0 10*3/uL (ref 0.0–0.1)
Basophils Relative: 0 %
Eosinophils Absolute: 0 10*3/uL (ref 0.0–0.5)
Eosinophils Relative: 0 %
HCT: 40.2 % (ref 39.0–52.0)
Hemoglobin: 13.1 g/dL (ref 13.0–17.0)
Immature Granulocytes: 0 %
Lymphocytes Relative: 11 %
Lymphs Abs: 0.7 10*3/uL (ref 0.7–4.0)
MCH: 29 pg (ref 26.0–34.0)
MCHC: 32.6 g/dL (ref 30.0–36.0)
MCV: 88.9 fL (ref 80.0–100.0)
Monocytes Absolute: 1.3 10*3/uL — ABNORMAL HIGH (ref 0.1–1.0)
Monocytes Relative: 19 %
Neutro Abs: 4.5 10*3/uL (ref 1.7–7.7)
Neutrophils Relative %: 70 %
Platelets: 177 10*3/uL (ref 150–400)
RBC: 4.52 MIL/uL (ref 4.22–5.81)
RDW: 14.3 % (ref 11.5–15.5)
WBC: 6.5 10*3/uL (ref 4.0–10.5)
nRBC: 0 % (ref 0.0–0.2)

## 2020-12-16 MED ORDER — NIRMATRELVIR/RITONAVIR (PAXLOVID)TABLET: 3.0000 | ORAL_TABLET | Freq: Two times a day (BID) | ORAL | 0 refills | Status: DC

## 2020-12-16 MED ORDER — ACETAMINOPHEN 325 MG PO TABS
650.0000 mg | ORAL_TABLET | Freq: Once | ORAL | Status: AC
  Administered 2020-12-16: 650 mg via ORAL

## 2020-12-16 MED ORDER — BENZONATATE 100 MG PO CAPS: 100.0000 mg | ORAL_CAPSULE | Freq: Three times a day (TID) | ORAL | 0 refills | Status: AC | PRN

## 2020-12-16 MED ORDER — ACETAMINOPHEN 325 MG PO TABS
ORAL_TABLET | ORAL | Status: AC
  Filled 2020-12-16: qty 2

## 2020-12-16 MED ORDER — ONDANSETRON 4 MG PO TBDP: 4.0000 mg | ORAL_TABLET | Freq: Three times a day (TID) | ORAL | 0 refills | Status: AC | PRN

## 2020-12-16 NOTE — ED Provider Notes (Signed)
[place]  ____________________________________________  Time seen: Approximately 3:21 PM  I have reviewed the triage vital signs and the nursing notes.   HISTORY  Chief Complaint Fatigue, Headache, Chills, and Emesis   Historian Patient     HPI Randy Greene is a 55 y.o. male with a history of MS, hypertension and pneumonia, presents to the urgent care with low-grade fever, malaise, myalgias and headache that started after patient returned from a vacation in [place].  Patient took an at home COVID-19 test and tested positive.  Patient denies chest pain or chest tightness.  Patient reports that his symptoms started last night.   Past Medical History:  Diagnosis Date   Acid reflux    Arthritis    Aspiration pneumonia (HCC)    Asthma    Delayed wound healing    left knee   Dyspnea    Enlarged prostate    Hypercapnic respiratory failure, chronic (HCC)    Hypertension    MS (multiple sclerosis) ([place])      Immunizations up to date:  Yes.     Past Medical History:  Diagnosis Date   Acid reflux    Arthritis    Aspiration pneumonia (HCC)    Asthma    Delayed wound healing    left knee   Dyspnea    Enlarged prostate    Hypercapnic respiratory failure, chronic (HCC)    Hypertension    MS (multiple sclerosis) ([place])     Patient Active Problem List   Diagnosis Date Noted   Dyspnea 10/22/2020   Abnormal CT of the chest 10/22/2020   Degenerative arthritis of left knee 08/11/2016   Osteoarthritis of left knee 08/10/2016   Agitation 03/01/2014   Hypoxia 01/01/2014   Chronic hypercapnic respiratory failure ([place]) 74/05/8785   Metabolic alkalosis with chronic respiratory acidosis 01/01/2014   OSA on CPAP 01/01/2014   Acid reflux 07/17/2013   Sepsis ([place]) 04/11/2013   Hypokalemia 11/30/2012   Transaminitis 11/29/2012   Acute encephalopathy 11/29/2012   MS (multiple sclerosis) ([place]) 11/29/2012   Recurrent aspiration bronchitis/pneumonia 11/30/2011     Past Surgical History:  Procedure Laterality Date   ARTHROSCOPIC     HERNIA REPAIR     INCISION AND DRAINAGE Left 01/04/2017   Procedure: INCISION AND DRAINAGE LEFT KNEE;  Surgeon: Rod Can, MD;  Location: [facility];  Service: Orthopedics;  Laterality: Left;   jawsurgery     KNEE ARTHROPLASTY Left 08/10/2016   Procedure: LEFT TOTAL KNEE ARTHROPLASTY WITH COMPUTER NAVIGATION;  Surgeon: Rod Can, MD;  Location: [facility];  Service: Orthopedics;  Laterality: Left;   knees scope      Prior to Admission medications   Medication Sig Start Date End Date Taking? Authorizing Provider  benzonatate (TESSALON PERLES) 100 MG capsule Take 1 capsule (100 mg total) by mouth 3 (three) times daily as needed for up to 7 days for cough. 12/16/20 12/23/20 Yes Vallarie Mare M, PA-C  ondansetron (ZOFRAN-ODT) 4 MG disintegrating tablet Take 1 tablet (4 mg total) by mouth every 8 (eight) hours as needed for up to 5 days for nausea or vomiting. 12/16/20 12/21/20 Yes Vallarie Mare M, PA-C  Acetaminophen-Codeine 300-30 MG tablet TAKE 1 TABLET BY MOUTH TWICE A DAY AS NEEDED (DO NOT TAKE WITH TRAMADOL) 08/26/20   [provider]  albuterol (PROVENTIL HFA;VENTOLIN HFA) 108 (90 BASE) MCG/ACT inhaler Inhale 2 puffs into the lungs every 6 (six) hours as needed for wheezing or shortness of breath.    [provider]  alfuzosin (UROXATRAL) 10 MG 24 hr tablet Take 10 mg by mouth daily with breakfast.    [provider]  amLODipine (NORVASC) 10 MG tablet Take 5 mg by mouth at bedtime.    [provider]  aspirin EC 81 MG tablet Take 81 mg by mouth at bedtime. Swallow whole.    [provider]  baclofen (LIORESAL) 10 MG tablet Take 15 mg by mouth 4 (four) times daily.     [provider]  Calcium Carbonate 500 (200 Ca) MG WAFR Take 1 tablet by mouth in the morning and at bedtime.    [provider]  cetirizine (ZYRTEC) 10 MG tablet Take 10 mg by mouth daily.     [provider]  Cholecalciferol (VITAMIN D) 50 MCG (2000 UT) tablet Take 2,000 Units by mouth daily.    [provider]  cyanocobalamin (,VITAMIN B-12,) 1000 MCG/ML injection Inject 1,000 mcg into the muscle once.    [provider]  diclofenac Sodium (VOLTAREN) 1 % GEL APPLY SMALL AMOUNT TO AFFECTED AREA TWICE A DAY AS NEEDED FOR PAIN (APPROVED) ### 11/27/19   [provider]  dronabinol (MARINOL) 2.5 MG capsule Take 7.5 mg by mouth 3 (three) times daily.     [provider]  esomeprazole (NEXIUM) 40 MG capsule Take 40 mg by mouth 2 (two) times daily before a meal.    [provider]  famotidine (PEPCID) 40 MG tablet Take 40 mg by mouth at bedtime.    [provider]  finasteride (PROSCAR) 5 MG tablet Take 5 mg by mouth daily.    [provider]  Ibuprofen-Acetaminophen (ADVIL DUAL ACTION PO) Take 1 tablet by mouth daily as needed (pain).    [provider]  indomethacin (INDOCIN) 50 MG capsule Take 50 mg by mouth 3 (three) times daily as needed (gout).    [provider]  ipratropium (ATROVENT) 0.03 % nasal spray SPRAY 2 SPRAYS INTO EACH NOSTRIL TWICE A DAY 11/13/19   [provider]  ketotifen (ZADITOR) 0.025 % ophthalmic solution Place 1 drop into both eyes 2 (two) times daily as needed (itching).    [provider]  losartan (COZAAR) 100 MG tablet Take 50 mg by mouth at bedtime.    [provider]  magnesium (MAGTAB) 84 MG (7MEQ) TBCR SR tablet Take 42 mg by mouth 2 (two) times daily.    [provider]  methylphenidate 36 MG PO CR tablet Take 36 mg by mouth See admin instructions. Take 36 mg by mouth every morning. May also take 36 mg in the afternoon as needed for energy.    [provider]  metoCLOPramide (REGLAN) 10 MG tablet Take 10 mg by mouth daily with supper.    [provider]  montelukast (SINGULAIR) 10 MG tablet Take 10 mg by mouth daily.     [provider]  naloxone (NARCAN) nasal spray 4 mg/0.1 mL SPRAY 1 SPRAY INTO ONE NOSTRIL AS DIRECTED FOR OPIOID OVERDOSE (TURN PERSON ON SIDE AFTER DOSE. IF NO RESPONSE IN 2-3 MINUTES OR PERSON RESPONDS BUT RELAPSES, REPEAT USING A NEW SPRAY DEVICE AND SPRAY INTO THE OTHER NOSTRIL. CALL 911 AFTER USE.) * EMERGENCY USE ONLY * 01/16/20   [provider]  nirmatrelvir/ritonavir EUA (PAXLOVID) TABS Take 3 tablets by mouth 2 (two) times daily for 5 days. Take nirmatrelvir (150 mg) two tablets twice daily for 5 days and ritonavir (100 mg) one tablet twice daily for 5 days. 12/16/20 12/21/20  [employer], [place]  Omega-3 Fatty Acids (FISH OIL PO) Take 1 capsule by mouth daily.    [provider]  ondansetron (ZOFRAN) 4 MG tablet Take 4 mg by mouth every 8 (eight) hours as needed for nausea or vomiting.    [provider]  oxcarbazepine (TRILEPTAL) 600 MG tablet Take 600 mg by mouth 2 (two) times daily.    [provider]  phenytoin (DILANTIN) 100 MG ER capsule TAKE ONE CAPSULE BY MOUTH IN THE MORNING AND EVENING TO REDUCE FACIAL PAIN 07/18/20   [provider]  pregabalin (LYRICA) 150 MG capsule Take 150 mg by mouth 3 (three) times daily.    [provider]  Probiotic Product (ALIGN) 4 MG CAPS Take 4 mg by mouth in the morning and at bedtime.    [provider]  sildenafil (VIAGRA) 100 MG tablet Take 100 mg by mouth daily as needed for erectile dysfunction.    [provider]  simvastatin (ZOCOR) 10 MG tablet Take 10 mg by mouth at bedtime.    [provider]  sodium chloride 1 g tablet Take 1 tablet by mouth daily. 06/26/20   [provider]  traMADol (ULTRAM) 50 MG tablet Take 50 mg by mouth every 6 (six) hours as needed for moderate pain or severe pain.    [provider]  vitamin C (ASCORBIC ACID) 500 MG tablet Take 500 mg by mouth daily.    [provider]  vitamin E 180 MG (400 UNITS)  capsule Take 400 Units by mouth daily.    [provider]  Zinc 50 MG TABS Take 50 mg by mouth every other day.    [provider]    Allergies Penicillins, Tape, Zoloft [sertraline], Food, Morphine, Mushroom extract complex, Morphine and related, and Penicillin g  Family History  Problem Relation Age of Onset   Dementia Mother    Hypertension Father    Heart attack Father     Social History Social History   Tobacco Use   Smoking status: Former    Packs/day: 1.00    Years: 20.00    Pack years: 20.00    Types: Cigarettes    Quit date: 11/02/2006    Years since quitting: 14.1   Smokeless tobacco: Never  Vaping Use   Vaping Use: Never used  Substance Use Topics   Alcohol use: Yes   Drug use: No    Comment: medical marijuana     Review of Systems  Constitutional: Patient has fever.  Eyes:  No discharge ENT: No upper respiratory complaints. Respiratory: Patient has cough. No SOB/ use of accessory muscles to breath Gastrointestinal: Patient had vomiting.  Musculoskeletal: Negative for musculoskeletal pain. Skin: Negative for rash, abrasions, lacerations, ecchymosis.    ____________________________________________   PHYSICAL EXAM:  VITAL SIGNS: ED Triage Vitals  Enc Vitals Group     BP 12/16/20 1514 128/89     Pulse Rate 12/16/20 1514 (!) 119     Resp 12/16/20 1514 (!) 22     Temp 12/16/20 1514 99.8 F (37.7 C)     Temp Source 12/16/20 1514 Oral     SpO2 12/16/20 1514 93 %     Weight --      Height --      Head Circumference --      Peak Flow --      Pain Score 12/16/20 1510 7     Pain Loc --      Pain Edu? --  Excl. in [place]? --      Constitutional: Alert and oriented. Patient is lying supine. Eyes: Conjunctivae are normal. PERRL. EOMI. Head: Atraumatic. ENT:      Ears: Tympanic membranes are mildly injected with mild effusion bilaterally.       Nose: No congestion/rhinnorhea.      Mouth/Throat: Mucous membranes are moist.  Posterior pharynx is mildly erythematous.  Hematological/Lymphatic/Immunilogical: No cervical lymphadenopathy.  Cardiovascular: Normal rate, regular rhythm. Normal S1 and S2.  Good peripheral circulation. Respiratory: Normal respiratory effort without tachypnea or retractions. Lungs CTAB. Good air entry to the bases with no decreased or absent breath sounds. Gastrointestinal: Bowel sounds 4 quadrants. Soft and nontender to palpation. No guarding or rigidity. No palpable masses. No distention. No CVA tenderness. Musculoskeletal: Full range of motion to all extremities. No gross deformities appreciated. Neurologic:  Normal speech and language. No gross focal neurologic deficits are appreciated.  Skin:  Skin is warm, dry and intact. No rash noted. Psychiatric: Mood and affect are normal. Speech and behavior are normal. Patient exhibits appropriate insight and judgement.   ____________________________________________   LABS (all labs ordered are listed, but only abnormal results are displayed)  Labs Reviewed  CBC WITH DIFFERENTIAL/PLATELET - Abnormal; Notable for the following components:      Result Value   Monocytes Absolute 1.3 (*)    All other components within normal limits  COMPREHENSIVE METABOLIC PANEL - Abnormal; Notable for the following components:   Glucose, Bld 102 (*)    All other components within normal limits   ____________________________________________  EKG   ____________________________________________  RADIOLOGY   No results found.  ____________________________________________    PROCEDURES  Procedure(s) performed:     Procedures     Medications  acetaminophen (TYLENOL) tablet 650 mg (650 mg Oral Given 12/16/20 1552)     ____________________________________________   INITIAL IMPRESSION / ASSESSMENT AND PLAN / ED COURSE  Pertinent labs & imaging results that were available during my care of the patient were reviewed by me and considered in  my medical decision making (see chart for details).      Assessment and Plan: Cough  Fever:  55 year old male presents to the urgent care with cough and fever that started over the past 24 hours.  Patient was febrile and tachycardic at triage.  Patient tested positive for COVID-19 at home.  CBC and CMP were reassuring.  Given patient's history of immunosuppression with MS medications, will start patient on pack Paxlovid   Recommended rest and hydration at home.  Tylenol and ibuprofen alternating were recommended for fever if fever occurs.  Recommended seeking care at local emergency department if patient experiences worsening chest pain, chest tightness, shortness of breath or other new or worsening symptoms.    ____________________________________________  FINAL CLINICAL IMPRESSION(S) / ED DIAGNOSES  Final diagnoses:  COVID-19      NEW MEDICATIONS STARTED DURING THIS VISIT:  ED Discharge Orders          Ordered    ondansetron (ZOFRAN-ODT) 4 MG disintegrating tablet  Every 8 hours PRN        12/16/20 1528    benzonatate (TESSALON PERLES) 100 MG capsule  3 times daily PRN        12/16/20 1528    nirmatrelvir/ritonavir EUA (PAXLOVID) TABS  2 times daily,   Status:  Discontinued        12/16/20 1613    nirmatrelvir/ritonavir EUA (PAXLOVID) TABS  2 times daily,   Status:  Discontinued  12/16/20 1614    nirmatrelvir/ritonavir EUA (PAXLOVID) TABS  2 times daily,   Status:  Discontinued        12/16/20 1615    nirmatrelvir/ritonavir EUA (PAXLOVID) TABS  2 times daily        12/16/20 1617                This chart was dictated using voice recognition software/Dragon. Despite best efforts to proofread, errors can occur which can change the meaning. Any change was purely unintentional.     Lannie Fields, PA-C 12/16/20 1848

## 2020-12-16 NOTE — ED Triage Notes (Signed)
Pt c/o fatigue, headache, runny nose, chills and vomiting. Patient states he has SOB. Patient states he wears a CPAP HS.   Started yesterday morning.   Tylenol this AM with no relief.

## 2020-12-16 NOTE — Discharge Instructions (Addendum)
I will call you with confirmation that it was safe to take your Paxlovid.

## 2020-12-17 ENCOUNTER — Telehealth (HOSPITAL_COMMUNITY): Payer: Self-pay | Admitting: Emergency Medicine

## 2020-12-17 ENCOUNTER — Telehealth (HOSPITAL_COMMUNITY): Payer: Self-pay | Admitting: Family Medicine

## 2020-12-17 MED ORDER — MOLNUPIRAVIR EUA 200MG CAPSULE: 4.0000 | ORAL_CAPSULE | Freq: Two times a day (BID) | ORAL | 0 refills | Status: AC

## 2020-12-17 MED ORDER — MOLNUPIRAVIR EUA 200MG CAPSULE: 4.0000 | ORAL_CAPSULE | Freq: Two times a day (BID) | ORAL | 0 refills | Status: DC

## 2020-12-17 NOTE — Telephone Encounter (Signed)
Pharmacy requested change from paxlovid to molnupiravir for COVID infection tx, rx sent

## 2021-01-03 ENCOUNTER — Encounter: Payer: No Typology Code available for payment source | Admitting: Physical Medicine & Rehabilitation

## 2021-01-07 ENCOUNTER — Telehealth: Payer: Self-pay | Admitting: Emergency Medicine

## 2021-01-07 DIAGNOSIS — R0602 Shortness of breath: Secondary | ICD-10-CM

## 2021-01-07 NOTE — Telephone Encounter (Signed)
Spoke with pt who states that he tested positive for Covid 3 weeks ago. Since that time he has had increased SOB, headaches, night sweats and increased weakness. Pt denies N/V/D/F/C. Pt is currently taking levofloxacin (day 6). Pt also reports of wt loss of 20 lbs in last 3 weeks. Dr. Silas Flood please advise since Dr. Lamonte Sakai is unavailable.

## 2021-01-07 NOTE — Telephone Encounter (Signed)
Needs appt in office and CXR at time of visit. See PCP if can not be seen here. CT chest 10/2020 with infiltrate lingula. Consider repeat CT based on evaluation in office.

## 2021-01-07 NOTE — Telephone Encounter (Signed)
Called and spoke with patient, advised or recommendations per MH.  He has 2 appointments on Friday at the [facility] in [place].  Advised that is the only appointment we have open between now and the end of July.  He stated he would take the appointment and will have the CXR done prior to his appointment.  CXR ordered.  Nothing further needed.

## 2021-01-08 ENCOUNTER — Encounter (HOSPITAL_COMMUNITY): Payer: Self-pay | Admitting: Emergency Medicine

## 2021-01-08 ENCOUNTER — Inpatient Hospital Stay (HOSPITAL_COMMUNITY)
Admission: EM | Admit: 2021-01-08 | Discharge: 2021-01-16 | DRG: 196 | Disposition: A | Discharge status: Home Health | Payer: No Typology Code available for payment source | Source: Ambulatory Visit | Attending: Internal Medicine | Admitting: Internal Medicine

## 2021-01-08 ENCOUNTER — Emergency Department (HOSPITAL_COMMUNITY): Payer: No Typology Code available for payment source

## 2021-01-08 DIAGNOSIS — J84116 Cryptogenic organizing pneumonia: Principal | ICD-10-CM | POA: Diagnosis present

## 2021-01-08 DIAGNOSIS — E785 Hyperlipidemia, unspecified: Secondary | ICD-10-CM | POA: Diagnosis present

## 2021-01-08 DIAGNOSIS — G500 Trigeminal neuralgia: Secondary | ICD-10-CM | POA: Diagnosis present

## 2021-01-08 DIAGNOSIS — Z885 Allergy status to narcotic agent status: Secondary | ICD-10-CM | POA: Diagnosis not present

## 2021-01-08 DIAGNOSIS — Z7982 Long term (current) use of aspirin: Secondary | ICD-10-CM

## 2021-01-08 DIAGNOSIS — Z8249 Family history of ischemic heart disease and other diseases of the circulatory system: Secondary | ICD-10-CM | POA: Diagnosis not present

## 2021-01-08 DIAGNOSIS — Z91048 Other nonmedicinal substance allergy status: Secondary | ICD-10-CM

## 2021-01-08 DIAGNOSIS — E876 Hypokalemia: Secondary | ICD-10-CM | POA: Diagnosis present

## 2021-01-08 DIAGNOSIS — G9340 Encephalopathy, unspecified: Secondary | ICD-10-CM

## 2021-01-08 DIAGNOSIS — Z880 Allergy status to penicillin: Secondary | ICD-10-CM

## 2021-01-08 DIAGNOSIS — N400 Enlarged prostate without lower urinary tract symptoms: Secondary | ICD-10-CM | POA: Diagnosis present

## 2021-01-08 DIAGNOSIS — R6521 Severe sepsis with septic shock: Secondary | ICD-10-CM

## 2021-01-08 DIAGNOSIS — G35 Multiple sclerosis: Secondary | ICD-10-CM | POA: Diagnosis present

## 2021-01-08 DIAGNOSIS — J45909 Unspecified asthma, uncomplicated: Secondary | ICD-10-CM | POA: Diagnosis present

## 2021-01-08 DIAGNOSIS — N401 Enlarged prostate with lower urinary tract symptoms: Secondary | ICD-10-CM | POA: Diagnosis not present

## 2021-01-08 DIAGNOSIS — J8489 Other specified interstitial pulmonary diseases: Secondary | ICD-10-CM

## 2021-01-08 DIAGNOSIS — U099 Post covid-19 condition, unspecified: Secondary | ICD-10-CM | POA: Diagnosis present

## 2021-01-08 DIAGNOSIS — J189 Pneumonia, unspecified organism: Secondary | ICD-10-CM | POA: Diagnosis present

## 2021-01-08 DIAGNOSIS — M1990 Unspecified osteoarthritis, unspecified site: Secondary | ICD-10-CM | POA: Diagnosis present

## 2021-01-08 DIAGNOSIS — Z20822 Contact with and (suspected) exposure to covid-19: Secondary | ICD-10-CM | POA: Diagnosis present

## 2021-01-08 DIAGNOSIS — I951 Orthostatic hypotension: Secondary | ICD-10-CM | POA: Diagnosis present

## 2021-01-08 DIAGNOSIS — J9601 Acute respiratory failure with hypoxia: Secondary | ICD-10-CM | POA: Diagnosis present

## 2021-01-08 DIAGNOSIS — Z888 Allergy status to other drugs, medicaments and biological substances status: Secondary | ICD-10-CM

## 2021-01-08 DIAGNOSIS — Z79899 Other long term (current) drug therapy: Secondary | ICD-10-CM

## 2021-01-08 DIAGNOSIS — A419 Sepsis, unspecified organism: Secondary | ICD-10-CM | POA: Diagnosis not present

## 2021-01-08 DIAGNOSIS — E871 Hypo-osmolality and hyponatremia: Secondary | ICD-10-CM | POA: Diagnosis present

## 2021-01-08 DIAGNOSIS — R411 Anterograde amnesia: Secondary | ICD-10-CM | POA: Diagnosis present

## 2021-01-08 DIAGNOSIS — G4733 Obstructive sleep apnea (adult) (pediatric): Secondary | ICD-10-CM | POA: Diagnosis present

## 2021-01-08 DIAGNOSIS — T82524A Displacement of infusion catheter, initial encounter: Secondary | ICD-10-CM

## 2021-01-08 DIAGNOSIS — G35D Multiple sclerosis, unspecified: Secondary | ICD-10-CM | POA: Diagnosis present

## 2021-01-08 DIAGNOSIS — K219 Gastro-esophageal reflux disease without esophagitis: Secondary | ICD-10-CM | POA: Diagnosis present

## 2021-01-08 DIAGNOSIS — J1282 Pneumonia due to coronavirus disease 2019: Secondary | ICD-10-CM | POA: Diagnosis not present

## 2021-01-08 DIAGNOSIS — R0902 Hypoxemia: Secondary | ICD-10-CM | POA: Diagnosis present

## 2021-01-08 DIAGNOSIS — J188 Other pneumonia, unspecified organism: Secondary | ICD-10-CM | POA: Diagnosis present

## 2021-01-08 DIAGNOSIS — R059 Cough, unspecified: Secondary | ICD-10-CM

## 2021-01-08 DIAGNOSIS — R197 Diarrhea, unspecified: Secondary | ICD-10-CM | POA: Diagnosis present

## 2021-01-08 DIAGNOSIS — E872 Acidosis: Secondary | ICD-10-CM | POA: Diagnosis present

## 2021-01-08 DIAGNOSIS — I110 Hypertensive heart disease with heart failure: Secondary | ICD-10-CM | POA: Diagnosis present

## 2021-01-08 DIAGNOSIS — I10 Essential (primary) hypertension: Secondary | ICD-10-CM | POA: Diagnosis not present

## 2021-01-08 DIAGNOSIS — Z87891 Personal history of nicotine dependence: Secondary | ICD-10-CM

## 2021-01-08 DIAGNOSIS — R0602 Shortness of breath: Secondary | ICD-10-CM

## 2021-01-08 LAB — COMPREHENSIVE METABOLIC PANEL
ALT: 28 U/L (ref 0–44)
AST: 45 U/L — ABNORMAL HIGH (ref 15–41)
Albumin: 2.9 g/dL — ABNORMAL LOW (ref 3.5–5.0)
Alkaline Phosphatase: 84 U/L (ref 38–126)
Anion gap: 12 (ref 5–15)
BUN: 7 mg/dL (ref 6–20)
CO2: 19 mmol/L — ABNORMAL LOW (ref 22–32)
Calcium: 8.6 mg/dL — ABNORMAL LOW (ref 8.9–10.3)
Chloride: 100 mmol/L (ref 98–111)
Creatinine, Ser: 0.96 mg/dL (ref 0.61–1.24)
GFR, Estimated: >60 mL/min (ref >60)
Glucose, Bld: 183 mg/dL — ABNORMAL HIGH (ref 70–99)
Potassium: 3.3 mmol/L — ABNORMAL LOW (ref 3.5–5.1)
Sodium: 131 mmol/L — ABNORMAL LOW (ref 135–145)
Total Bilirubin: 0.5 mg/dL (ref 0.3–1.2)
Total Protein: 6.8 g/dL (ref 6.5–8.1)

## 2021-01-08 LAB — CBC WITH DIFFERENTIAL/PLATELET
Abs Immature Granulocytes: 0.03 10*3/uL (ref 0.00–0.07)
Basophils Absolute: 0 10*3/uL (ref 0.0–0.1)
Basophils Relative: 0 %
Eosinophils Absolute: 0 10*3/uL (ref 0.0–0.5)
Eosinophils Relative: 0 %
HCT: 36.4 % — ABNORMAL LOW (ref 39.0–52.0)
Hemoglobin: 11.8 g/dL — ABNORMAL LOW (ref 13.0–17.0)
Immature Granulocytes: 1 %
Lymphocytes Relative: 8 %
Lymphs Abs: 0.3 10*3/uL — ABNORMAL LOW (ref 0.7–4.0)
MCH: 28 pg (ref 26.0–34.0)
MCHC: 32.4 g/dL (ref 30.0–36.0)
MCV: 86.5 fL (ref 80.0–100.0)
Monocytes Absolute: 0.4 10*3/uL (ref 0.1–1.0)
Monocytes Relative: 13 %
Neutro Abs: 2.6 10*3/uL (ref 1.7–7.7)
Neutrophils Relative %: 78 %
Platelets: 205 10*3/uL (ref 150–400)
RBC: 4.21 MIL/uL — ABNORMAL LOW (ref 4.22–5.81)
RDW: 13.7 % (ref 11.5–15.5)
WBC: 3.3 10*3/uL — ABNORMAL LOW (ref 4.0–10.5)
nRBC: 0 % (ref 0.0–0.2)

## 2021-01-08 LAB — URINALYSIS, ROUTINE W REFLEX MICROSCOPIC
Bilirubin Urine: NEGATIVE
Glucose, UA: NEGATIVE mg/dL
Hgb urine dipstick: NEGATIVE
Ketones, ur: NEGATIVE mg/dL
Leukocytes,Ua: NEGATIVE
Nitrite: NEGATIVE
Protein, ur: NEGATIVE mg/dL
Specific Gravity, Urine: 1.005 (ref 1.005–1.030)
pH: 6 (ref 5.0–8.0)

## 2021-01-08 LAB — LACTIC ACID, PLASMA
Lactic Acid, Venous: 4.1 mmol/L (ref 0.5–1.9)
Lactic Acid, Venous: 2.2 mmol/L (ref 0.5–1.9)

## 2021-01-08 LAB — APTT: aPTT: 32 seconds (ref 24–36)

## 2021-01-08 LAB — PROTIME-INR
INR: 1.1 (ref 0.8–1.2)
Prothrombin Time: 14 seconds (ref 11.4–15.2)

## 2021-01-08 MED ORDER — LACTATED RINGERS IV BOLUS (SEPSIS): 500.0000 mL | Freq: Once | INTRAVENOUS | Status: DC

## 2021-01-08 MED ORDER — MONTELUKAST SODIUM 10 MG PO TABS
10.0000 mg | ORAL_TABLET | Freq: Every day | ORAL | Status: DC
  Administered 2021-01-09 – 2021-01-16 (×8): 10 mg via ORAL
  Filled 2021-01-08 (×9): qty 1

## 2021-01-08 MED ORDER — ACETAMINOPHEN 325 MG PO TABS
650.0000 mg | ORAL_TABLET | Freq: Once | ORAL | Status: AC | PRN
  Administered 2021-01-08: 650 mg via ORAL
  Filled 2021-01-08: qty 2

## 2021-01-08 MED ORDER — LACTATED RINGERS IV BOLUS (SEPSIS): 1000.0000 mL | Freq: Once | INTRAVENOUS | Status: DC

## 2021-01-08 MED ORDER — ALBUTEROL SULFATE (2.5 MG/3ML) 0.083% IN NEBU: 2.5000 mg | INHALATION_SOLUTION | Freq: Four times a day (QID) | RESPIRATORY_TRACT | Status: DC | PRN

## 2021-01-08 MED ORDER — SIMVASTATIN 20 MG PO TABS
10.0000 mg | ORAL_TABLET | Freq: Every day | ORAL | Status: DC
  Administered 2021-01-08 – 2021-01-15 (×8): 10 mg via ORAL
  Filled 2021-01-08 (×8): qty 1

## 2021-01-08 MED ORDER — SENNOSIDES-DOCUSATE SODIUM 8.6-50 MG PO TABS: 1.0000 | ORAL_TABLET | Freq: Every evening | ORAL | Status: DC | PRN

## 2021-01-08 MED ORDER — PREGABALIN 100 MG PO CAPS: 300.0000 mg | ORAL_CAPSULE | Freq: Every day | ORAL | Status: DC

## 2021-01-08 MED ORDER — NALOXONE HCL 4 MG/0.1ML NA LIQD: 1.0000 | NASAL | Status: DC

## 2021-01-08 MED ORDER — PANTOPRAZOLE SODIUM 40 MG PO TBEC
40.0000 mg | DELAYED_RELEASE_TABLET | Freq: Every day | ORAL | Status: DC
Start: 2021-01-09 — End: 2021-01-10
  Administered 2021-01-09: 40 mg via ORAL
  Filled 2021-01-08: qty 1

## 2021-01-08 MED ORDER — AMPHETAMINE-DEXTROAMPHETAMINE 30 MG PO TABS: 30.0000 mg | ORAL_TABLET | Freq: Two times a day (BID) | ORAL | Status: DC

## 2021-01-08 MED ORDER — SODIUM CHLORIDE 0.9 % IV SOLN
2.0000 g | Freq: Once | INTRAVENOUS | Status: AC
  Administered 2021-01-08: 2 g via INTRAVENOUS
  Filled 2021-01-08: qty 20

## 2021-01-08 MED ORDER — MOMETASONE FURO-FORMOTEROL FUM 200-5 MCG/ACT IN AERO
2.0000 | INHALATION_SPRAY | Freq: Two times a day (BID) | RESPIRATORY_TRACT | Status: DC
  Administered 2021-01-09 – 2021-01-16 (×13): 2 via RESPIRATORY_TRACT
  Filled 2021-01-08: qty 8.8

## 2021-01-08 MED ORDER — SODIUM CHLORIDE 0.9 % IV SOLN
500.0000 mg | Freq: Once | INTRAVENOUS | Status: AC
  Administered 2021-01-08: 500 mg via INTRAVENOUS
  Filled 2021-01-08: qty 500

## 2021-01-08 MED ORDER — IPRATROPIUM BROMIDE 0.06 % NA SOLN
2.0000 | Freq: Two times a day (BID) | NASAL | Status: DC
  Administered 2021-01-09: 2 via NASAL
  Filled 2021-01-08: qty 15

## 2021-01-08 MED ORDER — SODIUM CHLORIDE 0.9% FLUSH
3.0000 mL | Freq: Two times a day (BID) | INTRAVENOUS | Status: DC
  Administered 2021-01-08 – 2021-01-16 (×13): 3 mL via INTRAVENOUS

## 2021-01-08 MED ORDER — ACETAMINOPHEN 650 MG RE SUPP: 650.0000 mg | Freq: Four times a day (QID) | RECTAL | Status: DC | PRN

## 2021-01-08 MED ORDER — ACETAMINOPHEN 325 MG PO TABS
650.0000 mg | ORAL_TABLET | Freq: Four times a day (QID) | ORAL | Status: DC | PRN
  Administered 2021-01-08 – 2021-01-16 (×21): 650 mg via ORAL
  Filled 2021-01-08 (×21): qty 2

## 2021-01-08 MED ORDER — POTASSIUM CHLORIDE CRYS ER 20 MEQ PO TBCR
40.0000 meq | EXTENDED_RELEASE_TABLET | Freq: Once | ORAL | Status: AC
  Administered 2021-01-08: 40 meq via ORAL
  Filled 2021-01-08: qty 2

## 2021-01-08 MED ORDER — VANCOMYCIN HCL 2000 MG/400ML IV SOLN
2000.0000 mg | Freq: Once | INTRAVENOUS | Status: AC
  Administered 2021-01-08: 2000 mg via INTRAVENOUS
  Filled 2021-01-08: qty 400

## 2021-01-08 MED ORDER — PREGABALIN 75 MG PO CAPS
150.0000 mg | ORAL_CAPSULE | Freq: Two times a day (BID) | ORAL | Status: DC
  Administered 2021-01-09 – 2021-01-10 (×3): 150 mg via ORAL
  Filled 2021-01-08 (×3): qty 2

## 2021-01-08 MED ORDER — LACTATED RINGERS IV SOLN: INTRAVENOUS | Status: AC

## 2021-01-08 MED ORDER — LACTATED RINGERS IV BOLUS (SEPSIS)
1000.0000 mL | Freq: Once | INTRAVENOUS | Status: AC
  Administered 2021-01-08: 1000 mL via INTRAVENOUS

## 2021-01-08 MED ORDER — KETOTIFEN FUMARATE 0.025 % OP SOLN
1.0000 [drp] | Freq: Two times a day (BID) | OPHTHALMIC | Status: DC | PRN
  Filled 2021-01-08: qty 5

## 2021-01-08 MED ORDER — TRAMADOL HCL 50 MG PO TABS: 50.0000 mg | ORAL_TABLET | Freq: Four times a day (QID) | ORAL | Status: DC | PRN

## 2021-01-08 MED ORDER — VANCOMYCIN HCL 1500 MG/300ML IV SOLN
1500.0000 mg | Freq: Two times a day (BID) | INTRAVENOUS | Status: DC
  Administered 2021-01-09: 1500 mg via INTRAVENOUS
  Filled 2021-01-08: qty 300

## 2021-01-08 MED ORDER — PHENYTOIN SODIUM EXTENDED 100 MG PO CAPS
100.0000 mg | ORAL_CAPSULE | Freq: Two times a day (BID) | ORAL | Status: DC
  Administered 2021-01-08 – 2021-01-16 (×16): 100 mg via ORAL
  Filled 2021-01-08 (×18): qty 1

## 2021-01-08 MED ORDER — DRONABINOL 5 MG PO CAPS: 7.5000 mg | ORAL_CAPSULE | Freq: Three times a day (TID) | ORAL | Status: DC

## 2021-01-08 MED ORDER — FINASTERIDE 5 MG PO TABS
5.0000 mg | ORAL_TABLET | Freq: Every day | ORAL | Status: DC
  Administered 2021-01-09 – 2021-01-16 (×8): 5 mg via ORAL
  Filled 2021-01-08 (×8): qty 1

## 2021-01-08 MED ORDER — POTASSIUM CHLORIDE CRYS ER 20 MEQ PO TBCR: 40.0000 meq | EXTENDED_RELEASE_TABLET | Freq: Two times a day (BID) | ORAL | Status: DC

## 2021-01-08 MED ORDER — RIVAROXABAN 10 MG PO TABS
10.0000 mg | ORAL_TABLET | Freq: Every day | ORAL | Status: DC
  Administered 2021-01-09 – 2021-01-10 (×2): 10 mg via ORAL
  Filled 2021-01-08 (×2): qty 1

## 2021-01-08 MED ORDER — CYANOCOBALAMIN 1000 MCG/ML IJ SOLN
1000.0000 ug | Freq: Once | INTRAMUSCULAR | Status: AC
  Administered 2021-01-08: 1000 ug via INTRAMUSCULAR
  Filled 2021-01-08 (×2): qty 1

## 2021-01-08 MED ORDER — METOCLOPRAMIDE HCL 10 MG PO TABS
10.0000 mg | ORAL_TABLET | Freq: Every day | ORAL | Status: DC
  Administered 2021-01-08 – 2021-01-16 (×9): 10 mg via ORAL
  Filled 2021-01-08 (×9): qty 1

## 2021-01-08 MED ORDER — ALFUZOSIN HCL ER 10 MG PO TB24
10.0000 mg | ORAL_TABLET | Freq: Every day | ORAL | Status: DC
  Administered 2021-01-10 – 2021-01-12 (×3): 10 mg via ORAL
  Filled 2021-01-08 (×3): qty 1

## 2021-01-08 MED ORDER — PREGABALIN 25 MG PO CAPS: 150.0000 mg | ORAL_CAPSULE | ORAL | Status: DC

## 2021-01-08 MED ORDER — BACLOFEN 10 MG PO TABS
15.0000 mg | ORAL_TABLET | Freq: Four times a day (QID) | ORAL | Status: DC
  Administered 2021-01-08 – 2021-01-16 (×32): 15 mg via ORAL
  Filled 2021-01-08: qty 2
  Filled 2021-01-08: qty 1
  Filled 2021-01-08 (×4): qty 2
  Filled 2021-01-08: qty 1
  Filled 2021-01-08 (×14): qty 2
  Filled 2021-01-08: qty 1
  Filled 2021-01-08 (×3): qty 2
  Filled 2021-01-08: qty 1
  Filled 2021-01-08 (×2): qty 2
  Filled 2021-01-08: qty 1
  Filled 2021-01-08: qty 2
  Filled 2021-01-08: qty 1
  Filled 2021-01-08: qty 2
  Filled 2021-01-08: qty 1
  Filled 2021-01-08 (×2): qty 2
  Filled 2021-01-08: qty 1

## 2021-01-08 MED ORDER — ASPIRIN EC 81 MG PO TBEC
81.0000 mg | DELAYED_RELEASE_TABLET | Freq: Every day | ORAL | Status: DC
  Administered 2021-01-08 – 2021-01-15 (×8): 81 mg via ORAL
  Filled 2021-01-08 (×8): qty 1

## 2021-01-08 NOTE — ED Triage Notes (Signed)
Patient here for evaluation of shortness of breath after having COVID three weeks ago. States he was given levofloxacin for a pneumonia, today is last day of antibiotic and no improvement so PCP referred patient to ED. Patient alert, oriented, and dyspneic at rest.

## 2021-01-08 NOTE — ED Provider Notes (Signed)
[facility] EMERGENCY DEPARTMENT Provider Note   CSN: [id number] Arrival date & time: 01/08/21  1117     History Chief Complaint  Patient presents with   Shortness of Breath    Randy Greene is a 55 y.o. male.  HPI 55 year old male presents with dyspnea and chest pain.  This has been ongoing for about a week and a half and has been treated for pneumonia.  He had COVID that was diagnosed by a point-of-care home test about 3 weeks ago.  He then later developed the symptoms and was treated with Levaquin which she has finished.  However now he still having symptoms.  Has been having fevers that has been controlled with Tylenol and ibuprofen.  The right side of his chest hurts and he has shortness of breath and weakness.  He also has MS.  Past Medical History:  Diagnosis Date   Acid reflux    Arthritis    Aspiration pneumonia (HCC)    Asthma    Delayed wound healing    left knee   Dyspnea    Enlarged prostate    Hypercapnic respiratory failure, chronic (HCC)    Hypertension    MS (multiple sclerosis) ([place])     Patient Active Problem List   Diagnosis Date Noted   Dyspnea 10/22/2020   Abnormal CT of the chest 10/22/2020   Degenerative arthritis of left knee 08/11/2016   Osteoarthritis of left knee 08/10/2016   Agitation 03/01/2014   Hypoxia 01/01/2014   Chronic hypercapnic respiratory failure ([place]) 22/97/9892   Metabolic alkalosis with chronic respiratory acidosis 01/01/2014   OSA on CPAP 01/01/2014   Acid reflux 07/17/2013   Sepsis ([place]) 04/11/2013   Hypokalemia 11/30/2012   Transaminitis 11/29/2012   Acute encephalopathy 11/29/2012   MS (multiple sclerosis) ([place]) 11/29/2012   Recurrent aspiration bronchitis/pneumonia 11/30/2011    Past Surgical History:  Procedure Laterality Date   ARTHROSCOPIC     HERNIA REPAIR     INCISION AND DRAINAGE Left 01/04/2017   Procedure: INCISION AND DRAINAGE LEFT KNEE;  Surgeon: Rod Can, MD;  Location: [facility];   Service: Orthopedics;  Laterality: Left;   jawsurgery     KNEE ARTHROPLASTY Left 08/10/2016   Procedure: LEFT TOTAL KNEE ARTHROPLASTY WITH COMPUTER NAVIGATION;  Surgeon: Rod Can, MD;  Location: [facility];  Service: Orthopedics;  Laterality: Left;   knees scope         Family History  Problem Relation Age of Onset   Dementia Mother    Hypertension Father    Heart attack Father     Social History   Tobacco Use   Smoking status: Former    Packs/day: 1.00    Years: 20.00    Pack years: 20.00    Types: Cigarettes    Quit date: 11/02/2006    Years since quitting: 14.1   Smokeless tobacco: Never  Vaping Use   Vaping Use: Never used  Substance Use Topics   Alcohol use: Yes   Drug use: No    Comment: medical marijuana    Home Medications Prior to Admission medications   Medication Sig Start Date End Date Taking? Authorizing Provider  Acetaminophen-Codeine 300-30 MG tablet TAKE 1 TABLET BY MOUTH TWICE A DAY AS NEEDED (DO NOT TAKE WITH TRAMADOL) 08/26/20   [provider]  albuterol (PROVENTIL HFA;VENTOLIN HFA) 108 (90 BASE) MCG/ACT inhaler Inhale 2 puffs into the lungs every 6 (six) hours as needed for wheezing or shortness of breath.    [provider]  alfuzosin (UROXATRAL) 10 MG 24 hr tablet Take 10 mg by mouth daily with breakfast.    [provider]  amLODipine (NORVASC) 10 MG tablet Take 5 mg by mouth at bedtime.    [provider]  aspirin EC 81 MG tablet Take 81 mg by mouth at bedtime. Swallow whole.    [provider]  baclofen (LIORESAL) 10 MG tablet Take 15 mg by mouth 4 (four) times daily.     [provider]  Calcium Carbonate 500 (200 Ca) MG WAFR Take 1 tablet by mouth in the morning and at bedtime.    [provider]  cetirizine (ZYRTEC) 10 MG tablet Take 10 mg by mouth daily.    [provider]  Cholecalciferol (VITAMIN D) 50 MCG (2000 UT) tablet Take 2,000 Units by mouth daily.    [provider]  cyanocobalamin (,VITAMIN B-12,) 1000 MCG/ML injection Inject 1,000 mcg into the muscle once.    [provider]  diclofenac Sodium (VOLTAREN) 1 % GEL APPLY SMALL AMOUNT TO AFFECTED AREA TWICE A DAY AS NEEDED FOR PAIN (APPROVED) ### 11/27/19   [provider]  dronabinol (MARINOL) 2.5 MG capsule Take 7.5 mg by mouth 3 (three) times daily.     [provider]  esomeprazole (NEXIUM) 40 MG capsule Take 40 mg by mouth 2 (two) times daily before a meal.    [provider]  famotidine (PEPCID) 40 MG tablet Take 40 mg by mouth at bedtime.    [provider]  finasteride (PROSCAR) 5 MG tablet Take 5 mg by mouth daily.    [provider]  Ibuprofen-Acetaminophen (ADVIL DUAL ACTION PO) Take 1 tablet by mouth daily as needed (pain).    [provider]  indomethacin (INDOCIN) 50 MG capsule Take 50 mg by mouth 3 (three) times daily as needed (gout).    [provider]  ipratropium (ATROVENT) 0.03 % nasal spray SPRAY 2 SPRAYS INTO EACH NOSTRIL TWICE A DAY 11/13/19   [provider]  ketotifen (ZADITOR) 0.025 % ophthalmic solution Place 1 drop into both eyes 2 (two) times daily as needed (itching).    [provider]  losartan (COZAAR) 100 MG tablet Take 50 mg by mouth at bedtime.    [provider]  magnesium (MAGTAB) 84 MG (7MEQ) TBCR SR tablet Take 42 mg by mouth 2 (two) times daily.    [provider]  methylphenidate 36 MG PO CR tablet Take 36 mg by mouth See admin instructions. Take 36 mg by mouth every morning. May also take 36 mg in the afternoon as needed for energy.    [provider]  metoCLOPramide (REGLAN) 10 MG tablet Take 10 mg by mouth daily with supper.    [provider]  montelukast (SINGULAIR) 10 MG tablet Take 10 mg by mouth daily.    [provider]  naloxone (NARCAN) nasal spray 4 mg/0.1 mL SPRAY 1 SPRAY INTO ONE NOSTRIL AS DIRECTED FOR OPIOID  OVERDOSE (TURN PERSON ON SIDE AFTER DOSE. IF NO RESPONSE IN 2-3 MINUTES OR PERSON RESPONDS BUT RELAPSES, REPEAT USING A NEW SPRAY DEVICE AND SPRAY INTO THE OTHER NOSTRIL. CALL 911 AFTER USE.) * EMERGENCY USE ONLY * 01/16/20   [provider]  Omega-3 Fatty Acids (FISH OIL PO) Take 1 capsule by mouth daily.    [provider]  ondansetron (ZOFRAN) 4 MG tablet Take 4 mg by mouth every 8 (eight) hours as needed for nausea or vomiting.  [provider]  oxcarbazepine (TRILEPTAL) 600 MG tablet Take 600 mg by mouth 2 (two) times daily.    [provider]  phenytoin (DILANTIN) 100 MG ER capsule TAKE ONE CAPSULE BY MOUTH IN THE MORNING AND EVENING TO REDUCE FACIAL PAIN 07/18/20   [provider]  pregabalin (LYRICA) 150 MG capsule Take 150 mg by mouth 3 (three) times daily.    [provider]  Probiotic Product (ALIGN) 4 MG CAPS Take 4 mg by mouth in the morning and at bedtime.    [provider]  sildenafil (VIAGRA) 100 MG tablet Take 100 mg by mouth daily as needed for erectile dysfunction.    [provider]  simvastatin (ZOCOR) 10 MG tablet Take 10 mg by mouth at bedtime.    [provider]  sodium chloride 1 g tablet Take 1 tablet by mouth daily. 06/26/20   [provider]  traMADol (ULTRAM) 50 MG tablet Take 50 mg by mouth every 6 (six) hours as needed for moderate pain or severe pain.    [provider]  vitamin C (ASCORBIC ACID) 500 MG tablet Take 500 mg by mouth daily.    [provider]  vitamin E 180 MG (400 UNITS) capsule Take 400 Units by mouth daily.    [provider]  Zinc 50 MG TABS Take 50 mg by mouth every other day.    [provider]    Allergies    Penicillins, Tape, Zoloft [sertraline], Food, Morphine, Mushroom extract complex, Morphine and related, and Penicillin g  Review of Systems   Review of Systems  Constitutional:  Positive for fever.  Respiratory:   Positive for shortness of breath. Negative for cough.   Cardiovascular:  Positive for chest pain. Negative for leg swelling.  Gastrointestinal:  Negative for vomiting.  Neurological:  Positive for weakness and light-headedness.  All other systems reviewed and are negative.  Physical Exam Updated Vital Signs BP 103/79   Pulse (!) 106   Temp (!) 102.2 F (39 C) (Oral)   Resp 20   Wt 92.5 kg   SpO2 95%   BMI 26.91 kg/m   Physical Exam Vitals and nursing note reviewed.  Constitutional:      General: He is not in acute distress.    Appearance: He is well-developed. He is not diaphoretic.  HENT:     Head: Normocephalic and atraumatic.     Right Ear: External ear normal.     Left Ear: External ear normal.     Nose: Nose normal.  Eyes:     General:        Right eye: No discharge.        Left eye: No discharge.  Cardiovascular:     Rate and Rhythm: Regular rhythm. Tachycardia present.     Heart sounds: Normal heart sounds.  Pulmonary:     Effort: Pulmonary effort is normal.     Breath sounds: Examination of the right-lower field reveals rales. Rales present.  Abdominal:     Palpations: Abdomen is soft.     Tenderness: There is no abdominal tenderness.  Musculoskeletal:     Cervical back: Neck supple.     Right lower leg: No edema.     Left lower leg: No edema.  Skin:    General: Skin is warm and dry.  Neurological:     Mental Status: He is alert.  Psychiatric:        Mood and Affect: Mood is not anxious.  ED Results / Procedures / Treatments   Labs (all labs ordered are listed, but only abnormal results are displayed) Labs Reviewed  LACTIC ACID, PLASMA - Abnormal; Notable for the following components:      Result Value   Lactic Acid, Venous 4.1 (*)    All other components within normal limits  COMPREHENSIVE METABOLIC PANEL - Abnormal; Notable for the following components:   Sodium 131 (*)    Potassium 3.3 (*)    CO2 19 (*)    Glucose, Bld 183 (*)    Calcium  8.6 (*)    Albumin 2.9 (*)    AST 45 (*)    All other components within normal limits  CBC WITH DIFFERENTIAL/PLATELET - Abnormal; Notable for the following components:   WBC 3.3 (*)    RBC 4.21 (*)    Hemoglobin 11.8 (*)    HCT 36.4 (*)    Lymphs Abs 0.3 (*)    All other components within normal limits  CULTURE, BLOOD (ROUTINE X 2)  CULTURE, BLOOD (ROUTINE X 2)  LACTIC ACID, PLASMA  URINALYSIS, ROUTINE W REFLEX MICROSCOPIC  PROTIME-INR  APTT    EKG EKG Interpretation  Date/Time:  Wednesday January 08 2021 14:28:09 EDT Ventricular Rate:  107 PR Interval:  141 QRS Duration: 90 QT Interval:  337 QTC Calculation: 450 R Axis:   -8 Text Interpretation: Sinus tachycardia Abnormal R-wave progression, early transition Left ventricular hypertrophy Confirmed by Sherwood Gambler [phone]) on 01/08/2021 3:16:08 PM  Radiology DG Chest 2 View  Result Date: 01/08/2021 CLINICAL DATA:  shortness of breath EXAM: CHEST - 2 VIEW COMPARISON:  Chest CT 10/30/2020, chest radiograph 05/15/2014 FINDINGS: Cardiomediastinal silhouette is within normal limits. There is patchy airspace disease in the mid to lower lungs bilaterally. There is no large pleural effusion or visible pneumothorax. There is no acute osseous abnormality. Thoracic spondylosis. IMPRESSION: Patchy airspace disease in the mid to lower lungs bilaterally, concerning for multifocal infectious process. Electronically Signed   By: Maurine Simmering   On: 01/08/2021 12:32    Procedures .Critical Care  Date/Time: 01/08/2021 3:17 PM Performed by: Sherwood Gambler, MD Authorized by: Sherwood Gambler, MD   Critical care provider statement:    Critical care time (minutes):  35   Critical care time was exclusive of:  Separately billable procedures and treating other patients   Critical care was necessary to treat or prevent imminent or life-threatening deterioration of the following conditions:  Sepsis   Critical care was time spent personally by me on the  following activities:  Discussions with consultants, evaluation of patient's response to treatment, examination of patient, ordering and performing treatments and interventions, ordering and review of laboratory studies, ordering and review of radiographic studies, pulse oximetry, re-evaluation of patient's condition, obtaining history from patient or surrogate and review of old charts   Medications Ordered in ED Medications  vancomycin (VANCOREADY) IVPB 2000 mg/400 mL (has no administration in time range)  cefTRIAXone (ROCEPHIN) 2 g in sodium chloride 0.9 % 100 mL IVPB (has no administration in time range)  azithromycin (ZITHROMAX) 500 mg in sodium chloride 0.9 % 250 mL IVPB (has no administration in time range)  lactated ringers bolus 1,000 mL (1,000 mLs Intravenous New Bag/Given 01/08/21 1450)    And  lactated ringers bolus 1,000 mL (has no administration in time range)    And  lactated ringers bolus 1,000 mL (has no administration in time range)  potassium chloride SA (KLOR-CON) CR tablet 40 mEq (has no administration in time  range)  acetaminophen (TYLENOL) tablet 650 mg (650 mg Oral Given 01/08/21 1216)    ED Course  I have reviewed the triage vital signs and the nursing notes.  Pertinent labs & imaging results that were available during my care of the patient were reviewed by me and considered in my medical decision making (see chart for details).    MDM Rules/Calculators/A&P                           Patient's blood pressure is borderline here but not hypotensive.  However by his lactic acid he meets criteria for septic shock.  He was given 30 cc/KG of IV fluids.  He was given broad IV antibiotics to account for pneumonia after an viral infection.  Otherwise, it seems that his right-sided chest pain is correlating to the Rales on exam and my suspicion that this is PE is less given the fever and chest x-ray findings.  He will need admission to the stepdown unit.  Discussed with IM  teaching service for admission. Final Clinical Impression(s) / ED Diagnoses Final diagnoses:  Septic shock [facility])    Rx / DC Orders ED Discharge Orders     None        Sherwood Gambler, MD 01/08/21 [phone]

## 2021-01-08 NOTE — Sepsis Progress Note (Signed)
Code sepsis protocol being monitored by eLink. 

## 2021-01-08 NOTE — H&P (Addendum)
Date: 01/08/2021               Patient Name:  Randy Greene MRN: [id number]  DOB: [birth year] Age / Sex: 55 y.o., male   PCP: [facility] Service: Internal Medicine Teaching Service         Attending Physician: Dr. Heber [place], Rachel Moulds, DO    First Contact: Dr. Scarlett Presto Pager: [phone]  Second Contact: Dr. Marianna Payment Pager: [phone]       After Hours (After 5p/  First Contact Pager: [phone]  weekends / holidays): Second Contact Pager: [phone]   Chief Complaint: Shortness of breath  History of Present Illness:  Patient is a 55 y.o. male with a PMH of MS c/b anterograde memory loss, HTN, HLD, BPH, asthma, and who presents with shortness of breath. Patient had COVID a few weeks ago. He then started to feel bad again about seven days ago and he came to the ED and was diagnosed with pneumonia and given levaquin. However his symptoms have not seemed to resolve. He endorses a runny nose and pain in the center and to the right of his chest. The pain does not come and go, it is constant. Endorses some dizziness but denies any loss of consciousness. Does not have a cough.   Meds:    Current Outpatient Medications on File Prior to Encounter  Medication Sig Dispense Refill   Acetaminophen-Codeine 300-30 MG tablet Take 1 tablet by mouth 2 (two) times daily as needed for pain.     albuterol (PROVENTIL HFA;VENTOLIN HFA) 108 (90 BASE) MCG/ACT inhaler Inhale 2 puffs into the lungs every 6 (six) hours as needed for wheezing or shortness of breath.     alfuzosin (UROXATRAL) 10 MG 24 hr tablet Take 10 mg by mouth daily with breakfast.     amLODipine (NORVASC) 10 MG tablet Take 5 mg by mouth at bedtime.     aspirin EC 81 MG tablet Take 81 mg by mouth at bedtime. Swallow whole.     baclofen (LIORESAL) 10 MG tablet Take 15 mg by mouth 4 (four) times daily.      Calcium Carbonate 500 (200 Ca) MG WAFR Take 1 tablet by mouth in the morning and at bedtime.     cetirizine (ZYRTEC) 10  MG tablet Take 10 mg by mouth daily.     Cholecalciferol (VITAMIN D) 50 MCG (2000 UT) tablet Take 2,000 Units by mouth daily.     cyanocobalamin (,VITAMIN B-12,) 1000 MCG/ML injection Inject 1,000 mcg into the muscle once.     diclofenac Sodium (VOLTAREN) 1 % GEL Apply 2 g topically 2 (two) times daily as needed (pain).     dronabinol (MARINOL) 2.5 MG capsule Take 7.5 mg by mouth 3 (three) times daily.      esomeprazole (NEXIUM) 40 MG capsule Take 40 mg by mouth 2 (two) times daily before a meal.     famotidine (PEPCID) 40 MG tablet Take 40 mg by mouth at bedtime.     finasteride (PROSCAR) 5 MG tablet Take 5 mg by mouth daily.     Ibuprofen-Acetaminophen (ADVIL DUAL ACTION PO) Take 1 tablet by mouth daily as needed (pain).     indomethacin (INDOCIN) 50 MG capsule Take 50 mg by mouth 3 (three) times daily as needed (gout).     ipratropium (ATROVENT) 0.03 % nasal spray Place 2 sprays into both nostrils 2 (two) times daily.  ketotifen (ZADITOR) 0.025 % ophthalmic solution Place 1 drop into both eyes 2 (two) times daily as needed (itching).     losartan (COZAAR) 100 MG tablet Take 50 mg by mouth at bedtime.     magnesium (MAGTAB) 84 MG (7MEQ) TBCR SR tablet Take 42 mg by mouth 2 (two) times daily.     methylphenidate 36 MG PO CR tablet Take 36 mg by mouth See admin instructions. Take 36 mg by mouth every morning. May also take 36 mg in the afternoon as needed for energy.     metoCLOPramide (REGLAN) 10 MG tablet Take 10 mg by mouth daily with supper.     montelukast (SINGULAIR) 10 MG tablet Take 10 mg by mouth daily.     naloxone (NARCAN) nasal spray 4 mg/0.1 mL SPRAY 1 SPRAY INTO ONE NOSTRIL AS DIRECTED FOR OPIOID OVERDOSE (TURN PERSON ON SIDE AFTER DOSE. IF NO RESPONSE IN 2-3 MINUTES OR PERSON RESPONDS BUT RELAPSES, REPEAT USING A NEW SPRAY DEVICE AND SPRAY INTO THE OTHER NOSTRIL. CALL 911 AFTER USE.) * EMERGENCY USE ONLY *     Omega-3 Fatty Acids (FISH OIL PO) Take 1 capsule by mouth daily.      ondansetron (ZOFRAN) 4 MG tablet Take 4 mg by mouth every 8 (eight) hours as needed for nausea or vomiting.     oxcarbazepine (TRILEPTAL) 600 MG tablet Take 600 mg by mouth 2 (two) times daily.     phenytoin (DILANTIN) 100 MG ER capsule Take 100 mg by mouth 2 (two) times daily.     pregabalin (LYRICA) 150 MG capsule Take 150 mg by mouth 3 (three) times daily.     Probiotic Product (ALIGN) 4 MG CAPS Take 4 mg by mouth in the morning and at bedtime.     sildenafil (VIAGRA) 100 MG tablet Take 100 mg by mouth daily as needed for erectile dysfunction.     simvastatin (ZOCOR) 10 MG tablet Take 10 mg by mouth at bedtime.     sodium chloride 1 g tablet Take 1 tablet by mouth daily.     traMADol (ULTRAM) 50 MG tablet Take 50 mg by mouth every 6 (six) hours as needed for moderate pain or severe pain.     vitamin C (ASCORBIC ACID) 500 MG tablet Take 500 mg by mouth daily.     vitamin E 180 MG (400 UNITS) capsule Take 400 Units by mouth daily.     Zinc 50 MG TABS Take 50 mg by mouth every other day.       Allergies: Allergies as of 01/08/2021 - Review Complete 01/08/2021  Allergen Reaction Noted   Penicillins Hives and Other (See Comments) 01/01/2014   Tape Rash 10/27/2011   Zoloft [sertraline] Rash 12/15/2013   Food Nausea And Vomiting and Other (See Comments) 01/01/2014   Morphine Itching 07/14/2012   Mushroom extract complex Nausea And Vomiting and Other (See Comments) 01/01/2014   Morphine and related Itching 03/02/2014   Penicillin g Rash 03/01/2007   Past Medical History:  Diagnosis Date   Acid reflux    Arthritis    Aspiration pneumonia (HCC)    Asthma    Delayed wound healing    left knee   Dyspnea    Enlarged prostate    Hypercapnic respiratory failure, chronic (HCC)    Hypertension    MS (multiple sclerosis) ([facility])     Family History:  Heart disease in father Sister and uncle with cancer Many people in the family with HTN  Social History:  Lives alone, grows his own food  and is vegan, is disabled. Denies alcohol use, prior cigarette smoker for 25 years  Review of Systems: A complete ROS was negative except as per HPI.   Physical Exam: Blood pressure 136/89, pulse 100, temperature (!) 102.2 F (39 C), temperature source Oral, resp. rate (!) 25, weight 92.5 kg, SpO2 96 %.  Physical Exam Constitutional:      Appearance: He is well-developed.  HENT:     Head: Normocephalic and atraumatic.  Eyes:     Extraocular Movements: Extraocular movements intact.  Cardiovascular:     Rate and Rhythm: Normal rate and regular rhythm.     Heart sounds: No murmur heard.   No friction rub. No gallop.  Pulmonary:     Effort: Pulmonary effort is normal.     Breath sounds: Normal breath sounds. No wheezing or rhonchi.  Abdominal:     General: Bowel sounds are normal.     Palpations: Abdomen is soft.     Tenderness: There is no abdominal tenderness.  Musculoskeletal:     Cervical back: Neck supple.     Right lower leg: No edema.     Left lower leg: No edema.  Skin:    General: Skin is warm and dry.  Neurological:     Mental Status: He is alert and oriented to person, place, and time.     Motor: Weakness present.     EKG: personally reviewed my interpretation is sinus rhythm  CXR: Patchy airspace disease in the mid to lower lungs bilaterally, concerning for multifocal infectious process.  Assessment & Plan by Problem: Active Problems:   CAP (community acquired pneumonia)  #Sepsis Secondary to post-COVID Pneumonia #Lactic Acidosis Patient with ongoing respiratory symptoms in the setting of previously diagnosed post-viral pneumonia that have not improved after a seven day course of levaquin. Tmax in the ED was 39. CXR is still pending, but suspect insufficient treatment of infection in the setting of patient's immunocompromised state 2/2 MS with a WBC of 3.3. CXR concerning for multifocal infectious process. Overall patient does not look that ill on exam,  breathing well on room air with no increased WOB and lungs sounds CTAB. Lactic acid has start downtrending with administration of fluids. Will continue on maintenance fluids overnight - continue azithro, cetriaxone, and vanc - MRSA nares pendings; will likely narrow to CAP coverage  - f/u Bcx  #Hypokalemia, Hyponatremia Likely due to poor PO intake in the setting of PO illness, though could possible be due to SIADH. Giving fluids and will recheck morning BMP. -f/u BMP -replete PRN  #Multiple Sclerosis c/b anterograde memory loss  Has residual weakness on left side and paresthesias Will ask patient to bring home Dalframpridine which pharmacy doesn't have.  -continue home dronabinol 2.5 mg , baclofen 10 mg 4x daily -metoclopramide 10 mg daily, ondansetron 4 mg Q8 PRN  #GERD Continue home nexium 40 and famotidine 40  #Hypertension Holding home BP meds in the setting of sepsis,  #Hyperlipidemia -Continue home simvastatin 10 and aspirin 81  #BPH -continue home alfuzosin 10 mg and finasteride 5 mg  #Trigeminal Neuralgia Patient unsure if he still takes oxcarbazepine, holding for now. -continue home phenytoin 100 BID, pregabalin 150 QHS -tramadol 50 Q6 PRN  #OSA on CPAP Continue CPAP nightly  #Asthma Continue home albuterol, atrovent, dulera, and singulair  Dispo: Admit patient to Inpatient with expected length of stay greater than 2 midnights.  Signed: Scarlett Presto, MD 01/08/2021, 6:28 PM  Pager: [phone]  After 5pm on weekdays and 1pm on weekends: On Call pager: [phone]

## 2021-01-08 NOTE — Hospital Course (Addendum)
Randy Greene is a 55 y.o. with a pertinent PMH of hypertension, hyperlipidemia, asthma, OSA, trigeminal neuralgia, and MS c/b anterograde amnesia who presented with dyspnea in setting of post-COVID pneumonia and admitted for sepsis.    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 Respiratory panel, legionella and strep pneumoniae negative. Blood cultures remain negative to date. Azithromycin and cefepime discontinued on 7/25 per pulm recs. Has required supplemental O2 and remains on 5L [place], likely will need this chronically given degree of fibrotic changes noted on CT. In light of recent COVID infection and initial presentation with chest pain, there was concern for possible pulmonary embolism. CTA chest showing bilateral airspace opacities in the lungs likely 2/2 to cryptogenic organizing pneumonia vs acute pulm edema, no PE noted. Therapeutic lovenox given during admission. Pulm consulted and gave the patient 3 doses of IV lasix 38m. Weight decreased about 3.5kg during course of admission and subjectively his breathing improved. Patient underwent bronch/BAL on 7/26 which showed normal appearing airways with no evidence of inflammation, however, respiratory cultures pending. Started on solumedrol 625mIV after bronch and will be discharged with 1 week of 4056mredisone until he is able to follow up with pulm.   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 Continued home dronabinol 2.5mg82md baclofen 10mg38mday while admitted and also got Metoclopramide 10mg 106my, zofran 4mg q880mrn  Diarrhea Improved with imodium. No further episodes of diarrhea reported after 7/24.  Hypertension Currently normotensive. Patient wants to resume cardura. switched out alpha antagonist 7/24. Had an episode of orthostatic hypotension  on 7/27 and complained of dizziness and chest pain, which resolved after laying down   Admitted 01/08/2021  Allergies: Penicillins, Tape, Zoloft [sertraline], Food, Morphine, Mushroom extract complex, Morphine and related, and Penicillin g Pertinent Hx:  Acid reflux, Arthritis, Asthma, Enlarged prostate, Hypertension, and MS.   54 y.o.87ale p/w PNA  * PNA - multifocal, postviral, finished 4d broad-s[ec AB; O2 requirements fluctuates, as high as 4-5L; CTA neg for PE, but possible crypto multifocal PNA; pulm diuresing and performed bronch, BAL pending. Started steroids after broch.   Consults: Pulmonology Meds: steroids, cefepime, azithro, lasix  VTE ppx: Lovenox IVF: None  Diet: veggie/vegan

## 2021-01-08 NOTE — Progress Notes (Signed)
Pharmacy Antibiotic Note  Randy Greene is a 55 y.o. male presenting with  dyspnea and chest pain . Pt has had recent hx of pneumonia (completed course of levaquin) and COVID in past few weeks. Pharmacy has been consulted for vancomycin dosing for sepsis. Pt is febrile and tachycardic.Lactic Acid 4.1, WBC 3.3. CTX per MD.   Plan: TBW 92.5 kg; Ht 73 in, BMI 26.8 Scr 0.96, CrCl 97 mL/min  Vancomycin loading dose 2000 mg has been ordered  Vancomycin 1500 mg q12h (eAUC 535.0) Monitor renal function, cx/PCR results, clinical progression, and LOT  Add MRSA PCR.   Weight: 92.5 kg (204 lb)  Temp (24hrs), Avg:102.2 F (39 C), Min:102.2 F (39 C), Max:102.2 F (39 C)  Recent Labs  Lab 01/08/21 1224 01/08/21 1300  WBC  --  3.3*  CREATININE  --  0.96  LATICACIDVEN 4.1*  --     Estimated Creatinine Clearance: 97.1 mL/min (by C-G formula based on SCr of 0.96 mg/dL).    Allergies  Allergen Reactions   Penicillins Hives and Other (See Comments)    [ +FAMILY HISTORY] Has patient had a PCN reaction causing immediate rash, facial/tongue/throat swelling, SOB or lightheadedness with hypotension: Unknown Has patient had a PCN reaction causing severe rash involving mucus membranes or skin necrosis: Unknown Has patient had a PCN reaction that required hospitalization: No Has patient had a PCN reaction occurring within the last 10 years: No If all of the above answers are "NO", then may proceed with Cephalosporin use.     Tape Rash   Zoloft [Sertraline] Rash   Food Nausea And Vomiting and Other (See Comments)    GREEN PEPPERS- flushed  PT DOES NOT EAT MEAT   Morphine Itching   Mushroom Extract Complex Nausea And Vomiting and Other (See Comments)    And flushed   Morphine And Related Itching   Penicillin G Rash    Antimicrobials this admission: 7/20 Vancomycin >>  7/20 CTX x1  Microbiology results: 7/20 BCx: Pending  Thank you for allowing pharmacy to be a part of this patient's  care.  Marlowe Alt, PharmD Candidate 01/08/2021 3:41 PM

## 2021-01-09 DIAGNOSIS — K219 Gastro-esophageal reflux disease without esophagitis: Secondary | ICD-10-CM

## 2021-01-09 DIAGNOSIS — N401 Enlarged prostate with lower urinary tract symptoms: Secondary | ICD-10-CM

## 2021-01-09 DIAGNOSIS — E872 Acidosis: Secondary | ICD-10-CM

## 2021-01-09 DIAGNOSIS — I10 Essential (primary) hypertension: Secondary | ICD-10-CM

## 2021-01-09 DIAGNOSIS — A419 Sepsis, unspecified organism: Secondary | ICD-10-CM | POA: Diagnosis not present

## 2021-01-09 DIAGNOSIS — G4733 Obstructive sleep apnea (adult) (pediatric): Secondary | ICD-10-CM

## 2021-01-09 DIAGNOSIS — E785 Hyperlipidemia, unspecified: Secondary | ICD-10-CM

## 2021-01-09 DIAGNOSIS — G35 Multiple sclerosis: Secondary | ICD-10-CM

## 2021-01-09 DIAGNOSIS — J45909 Unspecified asthma, uncomplicated: Secondary | ICD-10-CM

## 2021-01-09 LAB — CBC
HCT: 31.5 % — ABNORMAL LOW (ref 39.0–52.0)
Hemoglobin: 10.4 g/dL — ABNORMAL LOW (ref 13.0–17.0)
MCH: 28.4 pg (ref 26.0–34.0)
MCHC: 33 g/dL (ref 30.0–36.0)
MCV: 86.1 fL (ref 80.0–100.0)
Platelets: 187 10*3/uL (ref 150–400)
RBC: 3.66 MIL/uL — ABNORMAL LOW (ref 4.22–5.81)
RDW: 14 % (ref 11.5–15.5)
WBC: 3.4 10*3/uL — ABNORMAL LOW (ref 4.0–10.5)
nRBC: 0 % (ref 0.0–0.2)

## 2021-01-09 LAB — COMPREHENSIVE METABOLIC PANEL
ALT: 25 U/L (ref 0–44)
AST: 39 U/L (ref 15–41)
Albumin: 2.5 g/dL — ABNORMAL LOW (ref 3.5–5.0)
Alkaline Phosphatase: 73 U/L (ref 38–126)
Anion gap: 9 (ref 5–15)
BUN: <5 mg/dL — ABNORMAL LOW (ref 6–20)
CO2: 21 mmol/L — ABNORMAL LOW (ref 22–32)
Calcium: 8.3 mg/dL — ABNORMAL LOW (ref 8.9–10.3)
Chloride: 105 mmol/L (ref 98–111)
Creatinine, Ser: 0.8 mg/dL (ref 0.61–1.24)
GFR, Estimated: >60 mL/min (ref >60)
Glucose, Bld: 110 mg/dL — ABNORMAL HIGH (ref 70–99)
Potassium: 3.9 mmol/L (ref 3.5–5.1)
Sodium: 135 mmol/L (ref 135–145)
Total Bilirubin: 0.6 mg/dL (ref 0.3–1.2)
Total Protein: 5.6 g/dL — ABNORMAL LOW (ref 6.5–8.1)

## 2021-01-09 LAB — RESPIRATORY PANEL BY PCR

## 2021-01-09 LAB — PROCALCITONIN: Procalcitonin: 0.13 ng/mL

## 2021-01-09 LAB — MRSA NEXT GEN BY PCR, NASAL: MRSA by PCR Next Gen: NOT DETECTED

## 2021-01-09 LAB — STREP PNEUMONIAE URINARY ANTIGEN: Strep Pneumo Urinary Antigen: NEGATIVE

## 2021-01-09 MED ORDER — LACTATED RINGERS IV BOLUS
500.0000 mL | Freq: Once | INTRAVENOUS | Status: AC
  Administered 2021-01-09: 500 mL via INTRAVENOUS

## 2021-01-09 MED ORDER — SODIUM CHLORIDE 0.9 % IV SOLN
2.0000 g | Freq: Three times a day (TID) | INTRAVENOUS | Status: DC
  Administered 2021-01-09 – 2021-01-11 (×6): 2 g via INTRAVENOUS
  Filled 2021-01-09 (×6): qty 2

## 2021-01-09 MED ORDER — PREGABALIN 75 MG PO CAPS
300.0000 mg | ORAL_CAPSULE | Freq: Every day | ORAL | Status: DC
  Administered 2021-01-09 – 2021-01-15 (×7): 300 mg via ORAL
  Filled 2021-01-09 (×7): qty 4

## 2021-01-09 MED ORDER — RISAQUAD PO CAPS
2.0000 | ORAL_CAPSULE | Freq: Three times a day (TID) | ORAL | Status: DC
  Administered 2021-01-09 – 2021-01-16 (×22): 2 via ORAL
  Filled 2021-01-09 (×25): qty 2

## 2021-01-09 MED ORDER — IBUPROFEN 800 MG PO TABS
800.0000 mg | ORAL_TABLET | Freq: Once | ORAL | Status: AC
  Administered 2021-01-09: 800 mg via ORAL
  Filled 2021-01-09: qty 1

## 2021-01-09 MED ORDER — AMPHETAMINE-DEXTROAMPHETAMINE 10 MG PO TABS
30.0000 mg | ORAL_TABLET | Freq: Two times a day (BID) | ORAL | Status: DC
  Administered 2021-01-10 – 2021-01-11 (×2): 30 mg via ORAL
  Filled 2021-01-09 (×2): qty 3

## 2021-01-09 MED ORDER — SODIUM CHLORIDE 0.9 % IV SOLN
2.0000 g | Freq: Once | INTRAVENOUS | Status: AC
  Administered 2021-01-09: 2 g via INTRAVENOUS
  Filled 2021-01-09: qty 2

## 2021-01-09 MED ORDER — DRONABINOL 2.5 MG PO CAPS
7.5000 mg | ORAL_CAPSULE | Freq: Three times a day (TID) | ORAL | Status: DC
  Administered 2021-01-10 – 2021-01-16 (×20): 7.5 mg via ORAL
  Filled 2021-01-09 (×21): qty 3

## 2021-01-09 MED ORDER — DEXTROSE 5 % IV SOLN
250.0000 mg | Freq: Every day | INTRAVENOUS | Status: DC
  Administered 2021-01-09 – 2021-01-11 (×3): 250 mg via INTRAVENOUS
  Filled 2021-01-09 (×3): qty 250

## 2021-01-09 MED ORDER — BACID PO TABS
2.0000 | ORAL_TABLET | Freq: Three times a day (TID) | ORAL | Status: DC
  Filled 2021-01-09 (×2): qty 2

## 2021-01-09 NOTE — Progress Notes (Signed)
Pharmacy Antibiotic Note  Randy Greene is a 55 y.o. male admitted on 01/08/2021 with sepsis.  Pharmacy has been consulted to add cefepime to ABX regimen.  Plan: Cefepime 2g IV Q8H.  Weight: 92.5 kg (204 lb)  Temp (24hrs), Avg:101.4 F (38.6 C), Min:97.5 F (36.4 C), Max:103.3 F (39.6 C)  Recent Labs  Lab 01/08/21 1224 01/08/21 1300 01/08/21 1455  WBC  --  3.3*  --   CREATININE  --  0.96  --   LATICACIDVEN 4.1*  --  2.2*    Estimated Creatinine Clearance: 97.1 mL/min (by C-G formula based on SCr of 0.96 mg/dL).    Allergies  Allergen Reactions   Penicillins Hives and Other (See Comments)    [ +FAMILY HISTORY] Has patient had a PCN reaction causing immediate rash, facial/tongue/throat swelling, SOB or lightheadedness with hypotension: Unknown Has patient had a PCN reaction causing severe rash involving mucus membranes or skin necrosis: Unknown Has patient had a PCN reaction that required hospitalization: No Has patient had a PCN reaction occurring within the last 10 years: No If all of the above answers are "NO", then may proceed with Cephalosporin use.     Tape Rash   Zoloft [Sertraline] Rash   Food Nausea And Vomiting and Other (See Comments)    GREEN PEPPERS- flushed  PT DOES NOT EAT MEAT   Morphine Itching   Mushroom Extract Complex Nausea And Vomiting and Other (See Comments)    And flushed   Morphine And Related Itching   Penicillin G Rash    Thank you for allowing pharmacy to be a part of this patient's care.  Wynona Neat, PharmD, BCPS  01/09/2021 3:58 AM

## 2021-01-09 NOTE — ED Notes (Signed)
Admitting paged to RN per her request 

## 2021-01-09 NOTE — ED Notes (Signed)
SDU Breakfast Ordered 

## 2021-01-09 NOTE — Progress Notes (Signed)
HD#1 Subjective:   Feeling pretty good today, says that he had some shortness of breath overnight but felt better once he got the oxygen on. Says that he might cough every now and then but denies any significant coughing. Denies any stomach pain or abdominal issues.   Objective:  Vital signs in last 24 hours: Vitals:   01/09/21 0215 01/09/21 0312 01/09/21 0400 01/09/21 0500  BP: 119/81  117/79 115/89  Pulse: (!) 103  91 90  Resp: 18   19  Temp:  (!) 103.3 F (39.6 C)  99 F (37.2 C)  TempSrc:  Oral  Oral  SpO2: 90%  90% 98%  Weight:       Supplemental O2: Nasal Cannula SpO2: 98 % O2 Flow Rate (L/min): 2 L/min   Physical Exam:  Constitutional: well-appearing man lying in bed in no acute distress Cardiovascular: regular rate and rhythm, no m/r/g Pulmonary/Chest: normal work of breathing on 2L [place], bibasilar crackles R>L Neurological: alert, answering questions appropriately, strength at baseline Skin: warm and dry   Filed Weights   01/08/21 1435  Weight: 92.5 kg     Intake/Output Summary (Last 24 hours) at 01/09/2021 0656 Last data filed at 01/09/2021 0513 Gross per 24 hour  Intake 2732.89 ml  Output --  Net 2732.89 ml   Net IO Since Admission: 2,732.89 mL [01/09/21 0656]  Pertinent Labs: CBC Latest Ref Rng & Units 01/09/2021 01/08/2021 12/16/2020  WBC 4.0 - 10.5 K/uL 3.4(L) 3.3(L) 6.5  Hemoglobin 13.0 - 17.0 g/dL 10.4(L) 11.8(L) 13.1  Hematocrit 39.0 - 52.0 % 31.5(L) 36.4(L) 40.2  Platelets 150 - 400 K/uL 187 205 177    CMP Latest Ref Rng & Units 01/09/2021 01/08/2021 12/16/2020  Glucose 70 - 99 mg/dL 110(H) 183(H) 102(H)  BUN 6 - 20 mg/dL <5(L) 7 6  Creatinine 0.61 - 1.24 mg/dL 0.80 0.96 0.96  Sodium 135 - 145 mmol/L 135 131(L) 136  Potassium 3.5 - 5.1 mmol/L 3.9 3.3(L) 3.8  Chloride 98 - 111 mmol/L 105 100 100  CO2 22 - 32 mmol/L 21(L) 19(L) 25  Calcium 8.9 - 10.3 mg/dL 8.3(L) 8.6(L) 9.5  Total Protein 6.5 - 8.1 g/dL 5.6(L) 6.8 7.5  Total Bilirubin 0.3 -  1.2 mg/dL 0.6 0.5 0.3  Alkaline Phos 38 - 126 U/L 73 84 86  AST 15 - 41 U/L 39 45(H) 29  ALT 0 - 44 U/L 25 28 25     Imaging: DG Chest 2 View  Result Date: 01/08/2021 CLINICAL DATA:  shortness of breath EXAM: CHEST - 2 VIEW COMPARISON:  Chest CT 10/30/2020, chest radiograph 05/15/2014 FINDINGS: Cardiomediastinal silhouette is within normal limits. There is patchy airspace disease in the mid to lower lungs bilaterally. There is no large pleural effusion or visible pneumothorax. There is no acute osseous abnormality. Thoracic spondylosis. IMPRESSION: Patchy airspace disease in the mid to lower lungs bilaterally, concerning for multifocal infectious process. Electronically Signed   By: Maurine Simmering   On: 01/08/2021 12:32    Assessment/Plan:   Active Problems:   CAP (community acquired pneumonia)   Patient Summary: Randy Greene is a 55 y.o. with a pertinent PMH of HTN, HLD, Asthma, OSA, BPH, trigeminal neuralgia, and MS c/b anterograde memory loss who presented with shortness of breath after a recent treatment for post-COVID pneumonia and was admitted for sepsis.  #Sepsis Secondary to post-COVID Pneumonia #Lactic Acidosis Patient continues to be intermittently febrile with a  Tmax of 103.3 now with a new oxygen requirement of 2L  [place]. Patient's antibiotics were broadened overnight  from ceftriaxone to cefepime, now on azithromycin cefepime. Received maintenance fluids overnight, will hold off on more fluids today. - MRSA nares negative, d/c vanc - f/u fungitell, strep pneumo, legionella tests - f/u Bcx   #Multiple Sclerosis c/b anterograde memory loss  Has residual weakness on left side and paresthesias  -continue home dronabinol 2.5 mg , baclofen 10 mg 4x daily -metoclopramide 10 mg daily, ondansetron 4 mg Q8 PRN   #Hypokalemia, Hyponatremia: resolved -replete PRN   #GERD Continue home nexium 40 and famotidine 40   #Hypertension Holding home BP meds in the setting of sepsis,    #Hyperlipidemia -Continue home simvastatin 10 and aspirin 81   #BPH -continue home alfuzosin 10 mg and finasteride 5 mg   #Trigeminal Neuralgia Patient unsure if he still takes oxcarbazepine, holding for now. -continue home phenytoin 100 BID, pregabalin 150 QHS -tramadol 50 Q6 PRN   #OSA on CPAP Continue CPAP nightly   #Asthma Continue home albuterol, atrovent, dulera, and singulair    Prior to Admission Living Arrangement: Home Anticipated Discharge Location: Home Barriers to Discharge: Continued medical workup Dispo: Anticipated discharge in 2-3 days  Scarlett Presto, MD Internal Medicine Resident PGY-1 Pager [phone] Please contact the on call pager after 5 pm and on weekends at [phone].

## 2021-01-09 NOTE — Progress Notes (Signed)
Patient refused the use of CPAP, stated that he preferred to get to his room before using CPAP. Patient placed on 3L [place] and is currently tolerating well.

## 2021-01-10 ENCOUNTER — Ambulatory Visit: Payer: No Typology Code available for payment source | Admitting: Primary Care

## 2021-01-10 DIAGNOSIS — A419 Sepsis, unspecified organism: Secondary | ICD-10-CM | POA: Diagnosis not present

## 2021-01-10 DIAGNOSIS — E876 Hypokalemia: Secondary | ICD-10-CM | POA: Diagnosis not present

## 2021-01-10 DIAGNOSIS — J1282 Pneumonia due to coronavirus disease 2019: Secondary | ICD-10-CM | POA: Diagnosis not present

## 2021-01-10 DIAGNOSIS — G500 Trigeminal neuralgia: Secondary | ICD-10-CM

## 2021-01-10 DIAGNOSIS — E871 Hypo-osmolality and hyponatremia: Secondary | ICD-10-CM

## 2021-01-10 DIAGNOSIS — E872 Acidosis: Secondary | ICD-10-CM | POA: Diagnosis not present

## 2021-01-10 LAB — CBC
HCT: 31.6 % — ABNORMAL LOW (ref 39.0–52.0)
Hemoglobin: 10.3 g/dL — ABNORMAL LOW (ref 13.0–17.0)
MCH: 28.1 pg (ref 26.0–34.0)
MCHC: 32.6 g/dL (ref 30.0–36.0)
MCV: 86.3 fL (ref 80.0–100.0)
Platelets: 211 10*3/uL (ref 150–400)
RBC: 3.66 MIL/uL — ABNORMAL LOW (ref 4.22–5.81)
RDW: 13.9 % (ref 11.5–15.5)
WBC: 3.8 10*3/uL — ABNORMAL LOW (ref 4.0–10.5)
nRBC: 0 % (ref 0.0–0.2)

## 2021-01-10 LAB — LEGIONELLA PNEUMOPHILA SEROGP 1 UR AG: L. pneumophila Serogp 1 Ur Ag: NEGATIVE

## 2021-01-10 LAB — COMPREHENSIVE METABOLIC PANEL
ALT: 23 U/L (ref 0–44)
AST: 33 U/L (ref 15–41)
Albumin: 2.5 g/dL — ABNORMAL LOW (ref 3.5–5.0)
Alkaline Phosphatase: 65 U/L (ref 38–126)
Anion gap: 8 (ref 5–15)
BUN: 6 mg/dL (ref 6–20)
CO2: 22 mmol/L (ref 22–32)
Calcium: 8.3 mg/dL — ABNORMAL LOW (ref 8.9–10.3)
Chloride: 104 mmol/L (ref 98–111)
Creatinine, Ser: 0.8 mg/dL (ref 0.61–1.24)
GFR, Estimated: >60 mL/min (ref >60)
Glucose, Bld: 107 mg/dL — ABNORMAL HIGH (ref 70–99)
Potassium: 3.4 mmol/L — ABNORMAL LOW (ref 3.5–5.1)
Sodium: 134 mmol/L — ABNORMAL LOW (ref 135–145)
Total Bilirubin: 0.4 mg/dL (ref 0.3–1.2)
Total Protein: 5.5 g/dL — ABNORMAL LOW (ref 6.5–8.1)

## 2021-01-10 MED ORDER — ENOXAPARIN SODIUM 40 MG/0.4ML IJ SOSY
40.0000 mg | PREFILLED_SYRINGE | INTRAMUSCULAR | Status: DC
  Administered 2021-01-13 – 2021-01-16 (×4): 40 mg via SUBCUTANEOUS
  Filled 2021-01-10 (×5): qty 0.4

## 2021-01-10 MED ORDER — POTASSIUM CHLORIDE CRYS ER 20 MEQ PO TBCR
30.0000 meq | EXTENDED_RELEASE_TABLET | Freq: Once | ORAL | Status: AC
  Administered 2021-01-10: 30 meq via ORAL
  Filled 2021-01-10: qty 1

## 2021-01-10 MED ORDER — PANTOPRAZOLE SODIUM 40 MG PO TBEC
40.0000 mg | DELAYED_RELEASE_TABLET | Freq: Two times a day (BID) | ORAL | Status: DC
  Administered 2021-01-10 – 2021-01-16 (×13): 40 mg via ORAL
  Filled 2021-01-10 (×14): qty 1

## 2021-01-10 MED ORDER — PREGABALIN 75 MG PO CAPS
150.0000 mg | ORAL_CAPSULE | Freq: Every day | ORAL | Status: DC
  Administered 2021-01-11 – 2021-01-16 (×6): 150 mg via ORAL
  Filled 2021-01-10 (×6): qty 2

## 2021-01-10 MED ORDER — FAMOTIDINE 20 MG PO TABS
20.0000 mg | ORAL_TABLET | Freq: Every day | ORAL | Status: DC
  Administered 2021-01-10 – 2021-01-15 (×6): 20 mg via ORAL
  Filled 2021-01-10 (×6): qty 1

## 2021-01-10 NOTE — Discharge Summary (Addendum)
Name: Randy Greene MRN: [id number] DOB: 1966/01/19 55 y.o. PCP: Greene  Date of Admission: 01/08/2021 11:43 AM Date of Discharge:  Attending Physician: Lucious Groves, DO  Discharge Diagnosis: 1. Acute hypoxic respiratory failure to pneumonia 2. Nonspecific interstitial pneumonia  3. Lactic acidosis, resolved 4. Hypokalemia 5. Hyponatremia 6. Multiple Sclerosis 7. GERD 8. Hypertension 9. Hyperlipidemia 10. BPH 11. OSA 12. Asthma 13. Trigeminal Neuralgia  Discharge Medications: Allergies as of 01/16/2021       Reactions   Penicillins Hives, Other (See Comments)   [ +FAMILY HISTORY] Has patient had a PCN reaction causing immediate rash, facial/tongue/throat swelling, SOB or lightheadedness with hypotension: Unknown Has patient had a PCN reaction causing severe rash involving mucus membranes or skin necrosis: Unknown Has patient had a PCN reaction that required hospitalization: No Has patient had a PCN reaction occurring within the last 10 years: No If all of the above answers are "NO", then may proceed with Cephalosporin use.   Tape Rash   Zoloft [sertraline] Rash   Food Nausea And Vomiting, Other (See Comments)   GREEN PEPPERS- flushed PT DOES NOT EAT MEAT   Morphine Itching   Mushroom Extract Complex Nausea And Vomiting, Other (See Comments)   And flushed   Morphine And Related Itching   Penicillin G Rash        Medication List     STOP taking these medications    Acetaminophen-Codeine 300-30 MG tablet   amLODipine 10 MG tablet Commonly known as: NORVASC   losartan 100 MG tablet Commonly known as: COZAAR   sildenafil 100 MG tablet Commonly known as: VIAGRA       TAKE these medications    albuterol 108 (90 Base) MCG/ACT inhaler Commonly known as: VENTOLIN HFA Inhale 2 puffs into the lungs every 6 (six) hours as needed for wheezing or shortness of breath.   alfuzosin 10 MG 24 hr tablet Commonly known as: UROXATRAL Take 10 mg by  mouth daily with breakfast.   Align 4 MG Caps Take 4 mg by mouth in the morning and at bedtime.   amphetamine-dextroamphetamine 30 MG tablet Commonly known as: ADDERALL Take 30 mg by mouth 2 (two) times daily.   aspirin EC 81 MG tablet Take 81 mg by mouth at bedtime. Swallow whole.   baclofen 10 MG tablet Commonly known as: LIORESAL Take 15 mg by mouth 4 (four) times daily.   Calcium Carbonate 500 (200 Ca) MG Wafr Take 1 tablet by mouth in the morning and at bedtime.   cetirizine 10 MG tablet Commonly known as: ZYRTEC Take 10 mg by mouth daily.   cyanocobalamin 1000 MCG/ML injection Commonly known as: (VITAMIN B-12) Inject 1,000 mcg into the muscle once.   diclofenac Sodium 1 % Gel Commonly known as: VOLTAREN Apply 2 g topically 2 (two) times daily as needed (pain).   dronabinol 2.5 MG capsule Commonly known as: MARINOL Take 7.5 mg by mouth 3 (three) times daily.   esomeprazole 40 MG capsule Commonly known as: NEXIUM Take 40 mg by mouth 2 (two) times daily before a meal.   famotidine 40 MG tablet Commonly known as: PEPCID Take 40 mg by mouth at bedtime.   finasteride 5 MG tablet Commonly known as: PROSCAR Take 5 mg by mouth daily.   FISH OIL PO Take 1 capsule by mouth daily.   fluorouracil 5 % cream Commonly known as: EFUDEX Apply 1 application topically 2 (two) times daily.   fluticasone-salmeterol 250-50 MCG/ACT Aepb Commonly known  as: ADVAIR Inhale 1 puff into the lungs in the morning and at bedtime.   Folic Acid-Vit U7-MLY Y50 2.5-25-1 MG Tabs tablet Commonly known as: FOLBEE Take 1 tablet by mouth daily.   ipratropium 0.03 % nasal spray Commonly known as: ATROVENT Place 2 sprays into both nostrils 2 (two) times daily.   ketotifen 0.025 % ophthalmic solution Commonly known as: ZADITOR Place 1 drop into both eyes 2 (two) times daily as needed (itching).   magnesium 84 MG (7MEQ) Tbcr SR tablet Commonly known as: MAGTAB Take 42 mg by mouth 2  (two) times daily.   metoCLOPramide 10 MG tablet Commonly known as: REGLAN Take 10 mg by mouth daily with supper.   montelukast 10 MG tablet Commonly known as: SINGULAIR Take 10 mg by mouth daily.   naloxone 4 MG/0.1ML Liqd nasal spray kit Commonly known as: NARCAN Place 1 spray into the nose See admin instructions. SPRAY 1 SPRAY INTO ONE NOSTRIL AS DIRECTED FOR OPIOID OVERDOSE (TURN PERSON ON SIDE AFTER DOSE. IF NO RESPONSE IN 2-3 MINUTES OR PERSON RESPONDS BUT RELAPSES, REPEAT USING A NEW SPRAY DEVICE AND SPRAY INTO THE OTHER NOSTRIL. CALL 911 AFTER USE.) * EMERGENCY USE ONLY *   ondansetron 4 MG tablet Commonly known as: ZOFRAN Take 4 mg by mouth every 8 (eight) hours as needed for nausea or vomiting.   phenytoin 100 MG ER capsule Commonly known as: DILANTIN Take 100 mg by mouth 2 (two) times daily.   predniSONE 10 MG tablet Commonly known as: DELTASONE Take 4 tablets (40 mg total) by mouth daily for 7 days.   pregabalin 150 MG capsule Commonly known as: LYRICA Take 150 mg by mouth See admin instructions. 162m am, 1569mnoon, and 30069mhs.   simvastatin 10 MG tablet Commonly known as: ZOCOR Take 10 mg by mouth at bedtime.   traMADol 50 MG tablet Commonly known as: ULTRAM Take 50 mg by mouth every 6 (six) hours as needed for moderate pain or severe pain.   vitamin C 500 MG tablet Commonly known as: ASCORBIC ACID Take 500 mg by mouth daily.   Vitamin D 50 MCG (2000 UT) tablet Take 2,000 Units by mouth daily.   vitamin E 180 MG (400 UNITS) capsule Take 400 Units by mouth daily.   Zinc 50 MG Tabs Take 50 mg by mouth every other day.               Durable Medical Equipment  (From admission, onward)           Start     Ordered   01/15/21 1332  For home use only DME oxygen  Once       Question Answer Comment  Length of Need 6 Months   Mode or (Route) Nasal cannula   Liters per Minute 5   Frequency Continuous (stationary and portable oxygen unit  needed)   Oxygen conserving device Yes   Oxygen delivery system Gas      01/15/21 1331   01/15/21 0000  For home use only DME oxygen       Comments: Please see if patient qualifies for oxygen conserving device/concentrator  Question Answer Comment  Length of Need 6 Months   Mode or (Route) Nasal cannula   Liters per Minute 5   Frequency Continuous (stationary and portable oxygen unit needed)   Oxygen conserving device Yes   Oxygen delivery system Gas      01/15/21 1117  Disposition and follow-up:   Mr.Rexton Iannone was discharged from [facility] in Stable condition.  At the hospital follow up visit please address:  1. 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 Respiratory panel, legionella and strep pneumoniae negative. Blood cultures remain negative to date. Continues to require supplemental O2 and remained on 5L [place]. In light of recent COVID infection and initial presentation with chest pain, there was concern for possible pulmonary embolism. CTA chest showing bilateral airspace opacities in the lungs likely 2/2 to cryptogenic organizing pneumonia vs acute pulm edema, no PE noted. Patient has also been following with pulmonology as outpatient for pulmonary nodules and dyspnea that improved with albuterol therapy. Bronch/BAL performed 7/26 showing normal appearing airways with no evidence of inflammation. RUL BAL results still pending. - Pulmonology consulted             - Weight down ~3.5kg since admission 2/2 diuresis              - Empiric steroids: solumedrol 70m q8h x3 doses followed by 645mdaily                         - Will be discharged on 4086mrednisone x1 week              - Discontinued abx 7/25             - Follow up with pulm in 1 week  - Wean O2 as tolerated   #Multiple  sclerosis c/b anterograde amnesia Residual weakness on left side and paresthesias, stable. - Continue home dronabinol 2.5mg24md baclofen 10mg3mday - Metoclopramide 10mg 49my, zofran 4mg q813mrn   #Diarrhea, resolved Improved with imodium. No further episodes of diarrhea reported. - Imodium 2mg prn25m#Hypertension Currently normotensive. Patient wants to resume cardura. Will switch out alpha antagonist at this time. Patient had orthostatic hypotension when getting out of bed and standing on 7/27, SBP dropped to 60. Patient felt dizzy and had chest pain during this episode, pain resolved after laying back down. Patient given 500cc fluid bolus for hypotension. SBP in 120s-130s after the episode. - Cardura 4mg dail58m Patient to hold BP meds until follow up with PCP due to hypotension   2.  Labs / imaging needed at time of follow-up: CBC, BMP  3.  Pending labs/ test needing follow-up: none  Follow-up Appointments:  Follow-up Information     [facility]   Specialty: [facility] will contact you to schedule apt times Contact information: [address] [place] [address] [phone]         [facility] Schedule an appointment as soon as possible for a visit in 1 week(s).   Specialty: General Practice Why: hospital follow up Contact information: [phone]. Schedule an appointment as soon as possible for a visit in 1 week(s).   Specialty: Pulmonology Contact information: [address] [place]: 1 week follow up  2. [facility]  3. Home health services: will call patient to schedule appt times  Hospital Course by problem list: Susano Thies is a 55 y.o. with a pertinent PMH of  hypertension, hyperlipidemia, asthma, OSA, trigeminal neuralgia, and MS c/b anterograde amnesia who presented with dyspnea in setting of post-COVID pneumonia and admitted for sepsis.    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 Respiratory panel, legionella and strep pneumoniae negative. Blood cultures remain negative to date. Azithromycin and cefepime discontinued on 7/25 per pulm recs. Has required supplemental O2 and remains on 5L [place], likely will need this chronically given degree of fibrotic changes noted on CT. In light of recent COVID infection and initial presentation with chest pain, there was concern for possible pulmonary embolism. CTA chest showing bilateral airspace opacities in the lungs likely 2/2 to cryptogenic organizing pneumonia vs acute pulm edema, no PE noted. Therapeutic lovenox given during admission. Pulm consulted and gave the patient 3 doses of IV lasix 12m. Weight decreased about 3.5kg during course of admission and subjectively his breathing improved. Patient underwent bronch/BAL on 7/26 which showed normal appearing airways with no evidence of inflammation, however, respiratory cultures pending. Started on solumedrol 638mIV after bronch and will be discharged with 1 week of 40110mredisone until he is able to follow up with pulmonology.   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 Continued home dronabinol 2.5mg71md baclofen 10mg61mday while admitted and also got Metoclopramide 10mg 37my, zofran 4mg q89mrn. Home health services will set up an appt with the patient.  Diarrhea Improved with imodium. No further episodes of diarrhea reported after 7/24.  Hypertension Currently normotensive. Patient wanted to resume cardura, switched out alpha antagonist 7/24. Had an episode of  orthostatic hypotension on 7/27 and complained of dizziness and chest pain, which resolved after laying down. Patient was subsequently given 500cc fluid bolus and BP improved.    Discharge Exam:   BP 138/79   Pulse 87   Temp 97.8 F (36.6 C) (Oral)   Resp 16   Ht _0  (1.88 m)   Wt 89.5 kg   SpO2 91%   BMI 25.33 kg/m  Discharge exam:  Constitutional: WNWD man lying in bed in no acute distress Cardiovascular: regular rate and rhythm, no m/r/g, no pitting edema Pulmonary/Chest: normal work of breathing on 5L [place], no wheezes, rales, or rhonchi Neurological: alert, answering questions appropriately, strength at baseline Skin: warm and dry   Pertinent Labs, Studies, and Procedures:  DG Chest 2 View  Result Date: 01/08/2021 CLINICAL DATA:  shortness of breath EXAM: CHEST - 2 VIEW COMPARISON:  Chest CT 10/30/2020, chest radiograph 05/15/2014 FINDINGS: Cardiomediastinal silhouette is within normal limits. There is patchy airspace disease in the mid to lower lungs bilaterally. There is no large pleural effusion or visible pneumothorax. There is no acute osseous abnormality. Thoracic spondylosis. IMPRESSION: Patchy airspace disease in the mid to lower lungs bilaterally, concerning for multifocal infectious process. Electronically Signed   By: Jacob  Maurine Simmering07/20/2022 12:32   CT Angio Chest Pulmonary Embolism (PE) W or WO Contrast  Result Date: 01/12/2021 CLINICAL DATA:  Elevated D-dimer levels, shortness of breath and hypoxia EXAM: CT ANGIOGRAPHY CHEST WITH CONTRAST TECHNIQUE: Multidetector CT imaging of the chest was performed using the standard protocol during bolus administration of intravenous contrast. Multiplanar CT image reconstructions and MIPs were obtained to evaluate the vascular anatomy. CONTRAST:  80mL OM17mQUE IOHEXOL 350 MG/ML SOLN COMPARISON:  Chest CT 10/30/2020  FINDINGS: Cardiovascular: No filling defect is identified in the pulmonary arterial tree to suggest pulmonary  embolus. Atherosclerotic calcification of the aortic arch and of the left anterior descending coronary artery. Mild to moderate cardiomegaly. Mediastinum/Nodes: Small type 1 hiatal hernia. Right paratracheal node 0.9 cm in short axis on image 49 series 5, previously 0.4 cm. Subcarinal node 1.5 cm in short axis on image 62 series 5, previously 1.0 cm. Borderline right infrahilar adenopathy. Lungs/Pleura: Scattered bilateral airspace opacities in both lungs with associated ground-glass densities and no per particular predominant distribution. No definite secondary pulmonary lobular interstitial accentuation at the lung apices. The small right middle lobe pulmonary nodule seen on the prior exam is not identified today although could be obscured by surrounding airspace opacities. Upper Abdomen: Speckled calcifications in the spleen compatible with old granulomatous disease. Musculoskeletal: Thoracic spondylosis. Review of the MIP images confirms the above findings. IMPRESSION: 1. Bilateral geographic airspace opacities in the lungs with likely reactive or congested lymph nodes in the mediastinum. I favor cryptogenic organizing pneumonia over acute pulmonary edema although there is some underlying cardiomegaly noted. 2. No filling defect is identified in the pulmonary arterial tree to suggest pulmonary embolus. 3. Aortic Atherosclerosis (ICD10-I70.0). Left anterior descending coronary artery atherosclerosis. 4. Small type 1 hiatal hernia. Electronically Signed   By: Van Clines M.D.   On: 01/12/2021 14:02   DG Chest Port 1 View  Result Date: 01/15/2021 CLINICAL DATA:  Shortness of breath, interstitial pneumonia EXAM: PORTABLE CHEST 1 VIEW COMPARISON:  01/13/2021 FINDINGS: Patchy bilateral interstitial and airspace opacities are again noted, not significantly changed since prior study. Heart is normal size. No effusions or pneumothorax. No acute bony abnormality. IMPRESSION: No significant change in multifocal  pneumonia. Electronically Signed   By: Rolm Baptise M.D.   On: 01/15/2021 09:17   DG Chest Port 1 View  Result Date: 01/13/2021 CLINICAL DATA:  Shortness of breath, hypoxia, pneumonia EXAM: PORTABLE CHEST 1 VIEW COMPARISON:  01/08/2021 FINDINGS: Compared to 5 days ago, there is worsening bilateral peripheral mixed interstitial and airspace opacities compatible with multifocal pneumonia including atypical or viral pneumonia. Asymmetric edema is felt to be less likely. No large effusion or pneumothorax. Mild cardiac enlargement without CHF. Trachea midline. Degenerative changes of the spine. IMPRESSION: Bilateral mixed interstitial and airspace opacities compatible with multifocal pneumonia. Electronically Signed   By: Jerilynn Mages.  Shick M.D.   On: 01/13/2021 11:07   CT Super D Chest W Contrast  Result Date: 10/30/2020 CLINICAL DATA:  Super D protocol to evaluate a lung mass. EXAM: CT CHEST WITH CONTRAST TECHNIQUE: Multidetector CT imaging of the chest was performed using thin slice collimation for electromagnetic bronchoscopy planning purposes, with intravenous contrast. CONTRAST:  69m OMNIPAQUE IOHEXOL 300 MG/ML  SOLN COMPARISON:  None. FINDINGS: Cardiovascular: The heart is normal in size. No pericardial effusion. The aorta is normal in caliber. No dissection or atherosclerotic calcifications. Mild calcifications at the branch vessel ostia. Scattered coronary artery calcifications noted. Mediastinum/Nodes: No mediastinal or hilar mass or lymphadenopathy. Small scattered normal sized lymph nodes are noted. The esophagus is grossly normal. There is a small hiatal hernia noted. The thyroid gland is unremarkable. Lungs/Pleura: Vague patchy 2.5 mm subpleural pulmonary nodule in the right middle lobe on image number 96/3. This is likely a benign lymph node. Patchy atelectasis or scarring associated with large right-sided spinal osteophytes. No pleural effusions or pleural lesions. Airspace opacity in the left upper lobe  adjacent to the major fissure has the appearance of an inflammatory or  infectious process. I do not see a discrete measurable mass. Upper Abdomen: No significant upper abdominal findings. No hepatic or adrenal gland lesions. No upper abdominal adenopathy. Musculoskeletal: No chest wall mass, supraclavicular or axillary adenopathy. The bony thorax is intact. IMPRESSION: 1. Airspace opacity in the left upper lobe adjacent to the major fissure has the appearance of an an infectious or inflammatory process. I do not see a discrete measurable mass. Recommend follow-up chest CT in 3 months to make sure this resolves. 2. No mediastinal or hilar mass or adenopathy. 3. 2.5 mm subpleural pulmonary nodule in the right middle lobe, likely a benign lymph node. 4. No findings for upper abdominal metastatic disease. 5. Aortic atherosclerosis. Aortic Atherosclerosis (ICD10-I70.0). Electronically Signed   By: Marijo Sanes M.D.   On: 10/30/2020 16:39    CBC Latest Ref Rng & Units 01/16/2021 01/15/2021 01/14/2021  WBC 4.0 - 10.5 K/uL 12.2(H) 4.1 4.4  Hemoglobin 13.0 - 17.0 g/dL 11.4(L) 11.5(L) 10.8(L)  Hematocrit 39.0 - 52.0 % 33.9(L) 34.7(L) 31.8(L)  Platelets 150 - 400 K/uL 406(H) 338 311    BMP Latest Ref Rng & Units 01/16/2021 01/15/2021 01/14/2021  Glucose 70 - 99 mg/dL 90 194(H) 98  BUN 6 - 20 mg/dL _0 Creatinine 0.61 - 1.24 mg/dL 0.72 0.69 0.79  Sodium 135 - 145 mmol/L 132(L) 132(L) 129(L)  Potassium 3.5 - 5.1 mmol/L 3.8 4.0 3.4(L)  Chloride 98 - 111 mmol/L 94(L) 97(L) 93(L)  CO2 22 - 32 mmol/L _1 Calcium 8.9 - 10.3 mg/dL 8.6(L) 8.9 8.4(L)     Discharge Instructions: FOLLOW-UP INSTRUCTIONS:  Thank you for allowing [place] to be part of your care. You were hospitalized for Multifocal pneumonia.  Please follow up with the following providers: A. [place], [address] [place] [address], [phone]  B. [place] [facility] in 1 week: Please call to make an appt.  [phone]   C. Home health services will contact you for an appt  Please note these changes made to your medications:   Please start prednisone 41m daily x7 days until you follow up with the pulmonology   A. Medications to discontinue until you follow up with your primary care physician:  Amlodipine  Losartan Slidenafil  Please make sure to see the pulmonologist in 1 week.   Please call our clinic if you have any questions or concerns, we may be able to help and keep you from a long and expensive emergency room wait. Our clinic and after hours phone number is [phone] the best time to call is Monday through Friday 9 am to 4 pm but there is always someone available 24/7 if you have an emergency. If you need medication refills please notify your pharmacy one week in advance and they will send [place] request.       Discharge Instructions     Diet - low sodium heart healthy   Complete by: As directed    Face-to-face encounter (required for Medicare/Medicaid patients)   Complete by: As directed    I Aroush Chasse N Janaysia Mcleroy certify that this patient is under my care and that I, or a nurse practitioner or physician's assistant working with me, had a face-to-face encounter that meets the physician face-to-face encounter requirements with this patient on 01/15/2021. The encounter with the patient was in whole, or in part for the following medical condition(s) which is the primary reason for home health care (List medical condition): Multiple sclerosis and acute deconditioning from  hospital stay/shortness of breath on exertion due to multifocal pneumonia   The encounter with the patient was in whole, or in part, for the following medical condition, which is the primary reason for home health care: history of MS and has acute deconditioning from hospital stay   I certify that, based on my findings, the following services are medically necessary home health services: Physical therapy   Reason for Medically Necessary  Home Health Services: Therapy- Therapeutic Exercises to Increase Strength and Endurance   My clinical findings support the need for the above services: Shortness of breath with activity   Further, I certify that my clinical findings support that this patient is homebound due to: Shortness of Breath with activity   For home use only DME oxygen   Complete by: As directed    Please see if patient qualifies for oxygen conserving device/concentrator   Length of Need: 6 Months   Mode or (Route): Nasal cannula   Liters per Minute: 5   Frequency: Continuous (stationary and portable oxygen unit needed)   Oxygen conserving device: Yes   Oxygen delivery system: Gas   Home Health   Complete by: As directed    To provide the following care/treatments:  OT PT     Increase activity slowly   Complete by: As directed        Signed:  Randel Hargens Raymondo Band, DO 01/16/2021, 12:03 Pager: [phone]

## 2021-01-10 NOTE — Plan of Care (Signed)

## 2021-01-10 NOTE — Progress Notes (Signed)
HD#2 Subjective:   Thinks that he is feeling a bit better today, but is endorsing a productive cough. He says that the sputum is clear in color. Denies any abdominal pain or vomiting.   Objective:  Vital signs in last 24 hours: Vitals:   01/10/21 0355 01/10/21 0635 01/10/21 0714 01/10/21 0913  BP: 106/71   (!) 124/93  Pulse: 93  84 76  Resp: 20 19  18   Temp: (!) 101.3 F (38.5 C) 99.6 F (37.6 C)  97.9 F (36.6 C)  TempSrc: Oral Oral  Oral  SpO2: 93%     Weight:      Height:       Supplemental O2: Nasal Cannula SpO2: 93 % O2 Flow Rate (L/min): 3 L/min   Physical Exam:  Constitutional: WNWD man lying in bed in no acute distress Cardiovascular: regular rate and rhythm, no m/r/g, no pitting edema Pulmonary/Chest: normal work of breathing on 3L [place], crackles in the posterior R lung up the the mid chest.  Neurological: alert, answering questions appropriately, strength at baseline Skin: warm and dry  Filed Weights   01/08/21 1435 01/09/21 1814  Weight: 92.5 kg 93.9 kg     Intake/Output Summary (Last 24 hours) at 01/10/2021 1117 Last data filed at 01/10/2021 0400 Gross per 24 hour  Intake 871.07 ml  Output --  Net 871.07 ml   Net IO Since Admission: 2,403.96 mL [01/10/21 1117]  Pertinent Labs: CBC Latest Ref Rng & Units 01/10/2021 01/09/2021 01/08/2021  WBC 4.0 - 10.5 K/uL 3.8(L) 3.4(L) 3.3(L)  Hemoglobin 13.0 - 17.0 g/dL 10.3(L) 10.4(L) 11.8(L)  Hematocrit 39.0 - 52.0 % 31.6(L) 31.5(L) 36.4(L)  Platelets 150 - 400 K/uL 211 187 205    CMP Latest Ref Rng & Units 01/10/2021 01/09/2021 01/08/2021  Glucose 70 - 99 mg/dL 107(H) 110(H) 183(H)  BUN 6 - 20 mg/dL 6 <5(L) 7  Creatinine 0.61 - 1.24 mg/dL 0.80 0.80 0.96  Sodium 135 - 145 mmol/L 134(L) 135 131(L)  Potassium 3.5 - 5.1 mmol/L 3.4(L) 3.9 3.3(L)  Chloride 98 - 111 mmol/L 104 105 100  CO2 22 - 32 mmol/L 22 21(L) 19(L)  Calcium 8.9 - 10.3 mg/dL 8.3(L) 8.3(L) 8.6(L)  Total Protein 6.5 - 8.1 g/dL 5.5(L) 5.6(L) 6.8   Total Bilirubin 0.3 - 1.2 mg/dL 0.4 0.6 0.5  Alkaline Phos 38 - 126 U/L 65 73 84  AST 15 - 41 U/L 33 39 45(H)  ALT 0 - 44 U/L 23 25 28     Imaging: No results found.  Assessment/Plan:   Active Problems:   CAP (community acquired pneumonia)   Patient Summary: Randy Greene is a 55 y.o. with a pertinent PMH of HTN, HLD, Asthma, OSA, BPH, trigeminal neuralgia, and MS c/b anterograde memory loss who presented with shortness of breath after a recent treatment for post-COVID pneumonia and was admitted for sepsis.   #Sepsis Secondary to post-COVID Pneumonia #Lactic Acidosis Patient continues to fever, Tmax of 39.2 overnight with oxygen requirement of 2-3 L . Continuing azithromycin and cefepime. Procal WNL, respiratory panel negative, - MRSA nares, strep pneumo negative - f/u fungitell, legionella tests - f/u Bcx; NGTD at 24 hours - f/u sputum culture   #Multiple Sclerosis c/b anterograde memory loss  Has residual weakness on left side and paresthesias -continue home dronabinol 2.5 mg , baclofen 10 mg 4x daily -metoclopramide 10 mg daily, ondansetron 4 mg Q8 PRN    #Hypokalemia, Hyponatremia -replete electrolytes PRN   #GERD Continue home nexium 40 BID  and famotidine 20 daily   #Hypertension Holding home BP meds in the setting of sepsis,   #Hyperlipidemia -Continue home simvastatin 10 and aspirin 81   #BPH -continue home alfuzosin 10 mg and finasteride 5 mg   #Trigeminal Neuralgia Patient unsure if he still takes oxcarbazepine, holding for now. -continue home phenytoin 100 BID, pregabalin 150 in the morning and 300 at night -tramadol 50 Q6 PRN   #OSA on CPAP Continue CPAP nightly   #Asthma Continue home albuterol, atrovent, dulera, and singulair   Prior to Admission Living Arrangement: Home Anticipated Discharge Location: Home Barriers to Discharge: Continued medical workup Dispo: Anticipated discharge in 2-3 days  Scarlett Presto, MD Internal Medicine Resident  PGY-1 Pager [phone] Please contact the on call pager after 5 pm and on weekends at [phone].

## 2021-01-11 DIAGNOSIS — J189 Pneumonia, unspecified organism: Secondary | ICD-10-CM | POA: Diagnosis not present

## 2021-01-11 LAB — BASIC METABOLIC PANEL
Anion gap: 7 (ref 5–15)
BUN: 6 mg/dL (ref 6–20)
CO2: 22 mmol/L (ref 22–32)
Calcium: 8.5 mg/dL — ABNORMAL LOW (ref 8.9–10.3)
Chloride: 105 mmol/L (ref 98–111)
Creatinine, Ser: 0.74 mg/dL (ref 0.61–1.24)
GFR, Estimated: >60 mL/min (ref >60)
Glucose, Bld: 92 mg/dL (ref 70–99)
Potassium: 3.7 mmol/L (ref 3.5–5.1)
Sodium: 134 mmol/L — ABNORMAL LOW (ref 135–145)

## 2021-01-11 LAB — CBC
HCT: 33.1 % — ABNORMAL LOW (ref 39.0–52.0)
Hemoglobin: 11.3 g/dL — ABNORMAL LOW (ref 13.0–17.0)
MCH: 28.8 pg (ref 26.0–34.0)
MCHC: 34.1 g/dL (ref 30.0–36.0)
MCV: 84.4 fL (ref 80.0–100.0)
Platelets: 224 10*3/uL (ref 150–400)
RBC: 3.92 MIL/uL — ABNORMAL LOW (ref 4.22–5.81)
RDW: 13.9 % (ref 11.5–15.5)
WBC: 4.2 10*3/uL (ref 4.0–10.5)
nRBC: 0 % (ref 0.0–0.2)

## 2021-01-11 LAB — FUNGITELL, SERUM: Fungitell Result: 36 pg/mL (ref <80)

## 2021-01-11 MED ORDER — IBUPROFEN 200 MG PO TABS
400.0000 mg | ORAL_TABLET | Freq: Once | ORAL | Status: AC
  Administered 2021-01-11: 400 mg via ORAL
  Filled 2021-01-11: qty 2

## 2021-01-11 MED ORDER — SODIUM CHLORIDE 0.9 % IV SOLN
2.0000 g | Freq: Three times a day (TID) | INTRAVENOUS | Status: AC
  Administered 2021-01-11 – 2021-01-12 (×5): 2 g via INTRAVENOUS
  Filled 2021-01-11 (×6): qty 2

## 2021-01-11 MED ORDER — LOPERAMIDE HCL 2 MG PO CAPS
2.0000 mg | ORAL_CAPSULE | ORAL | Status: DC | PRN
  Administered 2021-01-11 – 2021-01-12 (×3): 2 mg via ORAL
  Filled 2021-01-11 (×3): qty 1

## 2021-01-11 MED ORDER — AMPHETAMINE-DEXTROAMPHETAMINE 10 MG PO TABS
30.0000 mg | ORAL_TABLET | Freq: Two times a day (BID) | ORAL | Status: DC
  Administered 2021-01-12: 10 mg via ORAL
  Administered 2021-01-13 – 2021-01-16 (×6): 30 mg via ORAL
  Filled 2021-01-11 (×9): qty 3

## 2021-01-11 MED ORDER — DEXTROSE 5 % IV SOLN
250.0000 mg | Freq: Every day | INTRAVENOUS | Status: AC
  Administered 2021-01-12: 250 mg via INTRAVENOUS
  Filled 2021-01-11 (×2): qty 250

## 2021-01-11 NOTE — Progress Notes (Signed)
HD#3 Subjective:  Overnight Events: No acute overnight events reported.    Interval History: [name] was evaluated at bedside this morning. He is resting comfortably in bed. Reports multiple episodes of watery bowel movements since yesterday. He reports profusely sweating overnight and feels slightly weak this morning. He has concerns about his Randy Greene; otherwise reports improvement in breathing and denies any chest pain, abdominal pain or vomiting.   Objective:  Vital signs in last 24 hours: Vitals:   01/11/21 0300 01/11/21 0635 01/11/21 0815 01/11/21 0849  BP:  132/80 (!) 129/101   Pulse:  88 88 88  Resp: 16 20 (!) 22 18  Temp:  99.4 F (37.4 C) 98.4 F (36.9 C)   TempSrc:  Oral Oral   SpO2:  98% 98% 98%  Weight:      Height:       Supplemental O2: Nasal Cannula SpO2: 98 % O2 Flow Rate (L/min): 3 L/min FiO2 (%): 32 %   Physical Exam:  Physical Exam  Constitutional: Appears well-developed and well-nourished. No distress.  HENT: Normocephalic and atraumatic, EOMI, conjunctiva normal, moist mucous membranes Cardiovascular: Normal rate, regular rhythm, S1 and S2 present, no murmurs, rubs, gallops.  Distal pulses intact Respiratory: No respiratory distress, no accessory muscle use. Bibasilar crackles (R>L) on 3L [place].  GI: Nondistended, soft, nontender to palpation, normal active bowel sounds Musculoskeletal: Normal bulk and tone.  No peripheral edema noted. Neurological: Is alert and oriented x4, no apparent focal deficits noted. Skin: Warm and dry.  No rash, erythema, lesions noted. Psychiatric: Normal mood and affect. Behavior is normal. Judgment and thought content normal.    Filed Weights   01/08/21 1435 01/09/21 1814  Weight: 92.5 kg 93.9 kg     Intake/Output Summary (Last 24 hours) at 01/11/2021 1134 Last data filed at 01/11/2021 0304 Gross per 24 hour  Intake 580 ml  Output --  Net 580 ml   Net IO Since Admission: 2,983.96 mL [01/11/21  1134]  Pertinent Labs: CBC Latest Ref Rng & Units 01/11/2021 01/10/2021 01/09/2021  WBC 4.0 - 10.5 K/uL 4.2 3.8(L) 3.4(L)  Hemoglobin 13.0 - 17.0 g/dL 11.3(L) 10.3(L) 10.4(L)  Hematocrit 39.0 - 52.0 % 33.1(L) 31.6(L) 31.5(L)  Platelets 150 - 400 K/uL 224 211 187    CMP Latest Ref Rng & Units 01/11/2021 01/10/2021 01/09/2021  Glucose 70 - 99 mg/dL 92 107(H) 110(H)  BUN 6 - 20 mg/dL 6 6 <5(L)  Creatinine 0.61 - 1.24 mg/dL 0.74 0.80 0.80  Sodium 135 - 145 mmol/L 134(L) 134(L) 135  Potassium 3.5 - 5.1 mmol/L 3.7 3.4(L) 3.9  Chloride 98 - 111 mmol/L 105 104 105  CO2 22 - 32 mmol/L 22 22 21(L)  Calcium 8.9 - 10.3 mg/dL 8.5(L) 8.3(L) 8.3(L)  Total Protein 6.5 - 8.1 g/dL - 5.5(L) 5.6(L)  Total Bilirubin 0.3 - 1.2 mg/dL - 0.4 0.6  Alkaline Phos 38 - 126 U/L - 65 73  AST 15 - 41 U/L - 33 39  ALT 0 - 44 U/L - 23 25    Imaging: No results found.  Assessment/Plan:   Active Problems:   CAP (community acquired pneumonia)   Patient Summary: Randy Greene is a 55 y.o. with a pertinent PMH of hypertension, hyperlipidemia, asthma, OSA, trigeminal neuralgia, and MS c/b anterograde amnesia who presented with dyspnea in setting of post-COVID pneumonia and admitted for sepsis.    Sepsis 2/2 Post-COVID multifocal pneumonia, improving Patient has been afebrile for past 24 hours and WBC is improved.  Respiratory panel, legionella and strep pneumoniae negative. Blood cultures remain negative to date. Continues to have 3L oxygen requirement.  - Continue IV azithromycin and cefepime to complete 5 day course of abx - Can consider transitioning to PO antibiotics on discharge  - Check pulse ox with ambulation   Multiple sclerosis c/b anterograde amnesia Residual weakness on left side and paresthesias, stable.  - Continue home dronabinol 2.5mg  and baclofen 10mg  4x/day - Metoclopramide 10mg  daily, zofran 4mg  q8h prn  Diarrhea Patient endorses 4-5 episodes of watery diarrhea since yesterday. Denies any  abdominal pain. Abdomen nontender at this time. Suspect this is secondary to antibiotic use. Less likely to be C.diff at this time.  - Imodium 2mg  prn  Hypertension Currently normotensive. Patient would like to resume his home [place]; however, unsure of dosing at this time. Will hold off for now given initial presentation of sepsis.  - Continue to monitor   Diet:  vegetarian IVF: None,None VTE: Enoxaparin Code: Full PT/OT recs: Pending, none.  Prior to Admission Living Arrangement: Home Anticipated Discharge Location: Home w/HH Barriers to Discharge: Continued medical management Dispo: Anticipated discharge to Home in 1 days pending clinical improvement.   Harvie Heck, MD Internal Medicine Resident PGY-3 Pager# [phone]  Please contact the on call pager after 5 pm and on weekends at [phone].

## 2021-01-11 NOTE — Progress Notes (Signed)
PT Cancellation Note  Patient Details Name: Randy Greene MRN: [id number] DOB: Oct 31, 1965   Cancelled Treatment:    Reason Eval/Treat Not Completed: Patient declined, no reason specified (pt reports HA and denied mobility at this time, RN aware of request for pain meds)   [name] 01/11/2021, 1:37 PM [place] Pager: [phone] Office: [phone]

## 2021-01-11 NOTE — Progress Notes (Signed)
Pt refused cpap for tonight. VS stable on 5L [place]

## 2021-01-11 NOTE — Progress Notes (Signed)
Patient has completed 6 min walk on room air, and sats are constantly at 86%, patient denies any SOB,difficulty breathing or chest pain

## 2021-01-12 ENCOUNTER — Inpatient Hospital Stay (HOSPITAL_COMMUNITY): Payer: No Typology Code available for payment source

## 2021-01-12 DIAGNOSIS — J189 Pneumonia, unspecified organism: Secondary | ICD-10-CM | POA: Diagnosis not present

## 2021-01-12 LAB — CBC
HCT: 32.7 % — ABNORMAL LOW (ref 39.0–52.0)
Hemoglobin: 11.1 g/dL — ABNORMAL LOW (ref 13.0–17.0)
MCH: 28.6 pg (ref 26.0–34.0)
MCHC: 33.9 g/dL (ref 30.0–36.0)
MCV: 84.3 fL (ref 80.0–100.0)
Platelets: 270 10*3/uL (ref 150–400)
RBC: 3.88 MIL/uL — ABNORMAL LOW (ref 4.22–5.81)
RDW: 13.7 % (ref 11.5–15.5)
WBC: 4.3 10*3/uL (ref 4.0–10.5)
nRBC: 0 % (ref 0.0–0.2)

## 2021-01-12 LAB — BASIC METABOLIC PANEL
Anion gap: 9 (ref 5–15)
BUN: 7 mg/dL (ref 6–20)
CO2: 24 mmol/L (ref 22–32)
Calcium: 8.7 mg/dL — ABNORMAL LOW (ref 8.9–10.3)
Chloride: 100 mmol/L (ref 98–111)
Creatinine, Ser: 0.79 mg/dL (ref 0.61–1.24)
GFR, Estimated: >60 mL/min (ref >60)
Glucose, Bld: 108 mg/dL — ABNORMAL HIGH (ref 70–99)
Potassium: 4.1 mmol/L (ref 3.5–5.1)
Sodium: 133 mmol/L — ABNORMAL LOW (ref 135–145)

## 2021-01-12 LAB — D-DIMER, QUANTITATIVE: D-Dimer, Quant: 1.14 ug/mL-FEU — ABNORMAL HIGH (ref 0.00–0.50)

## 2021-01-12 MED ORDER — IOHEXOL 350 MG/ML SOLN
80.0000 mL | Freq: Once | INTRAVENOUS | Status: AC | PRN
  Administered 2021-01-12: 80 mL via INTRAVENOUS

## 2021-01-12 MED ORDER — FUROSEMIDE 10 MG/ML IJ SOLN
40.0000 mg | Freq: Once | INTRAMUSCULAR | Status: AC
  Administered 2021-01-12: 40 mg via INTRAVENOUS
  Filled 2021-01-12: qty 4

## 2021-01-12 MED ORDER — DOXAZOSIN MESYLATE 4 MG PO TABS
4.0000 mg | ORAL_TABLET | Freq: Every day | ORAL | Status: DC
  Administered 2021-01-14 – 2021-01-16 (×3): 4 mg via ORAL
  Filled 2021-01-12 (×4): qty 1

## 2021-01-12 NOTE — Progress Notes (Signed)
SATURATION QUALIFICATIONS: (This note is used to comply with regulatory documentation for home oxygen)  Patient Saturations on Room Air at Rest = 81%  Patient Saturations on Room Air while Ambulating = NA  Patient Saturations on 4 Liters of oxygen while Ambulating = 93%  Please briefly explain why patient needs home oxygen:  To maintain oxygen saturation >87% during functional activities.   Arby Barrette, PT Pager [phone]

## 2021-01-12 NOTE — Consult Note (Signed)
NAME:  Randy Greene, MRN:  [id number], DOB:  November 29, 1965, LOS: 4 ADMISSION DATE:  01/08/2021, CONSULTATION DATE:  01/12/21  REFERRING MD: IM TS, CHIEF COMPLAINT: Chest pain, dyspnea  History of Present Illness:  55 year old man admitted with hypoxemia.  Most recent pulmonary notes x2 reviewed.  H&P reviewed.  Most recent progress note reviewed.  Patient noted feeling ill lives town.  Returned home and tested positive for COVID at home end of 11/2020.  Present to the ED.   Notably O2 sat 93%.  No chest imaging performed.  Felt better after about a week.  Viral symptoms seem to improve.  However about a week later developed chest pain, dyspnea.  Not much cough.  Went to PCP at [facility].  Chest imaging at that time reviewed on patient's iPad with bilateral mid to lower lung field interstitial changes.  He was diagnosed with pneumonia.  Given 5 days of levofloxacin.  He continues to feel well.  This prompted presentation to ED.  There is hypoxemic on room air.  Placed on 3 to 4 L.  Admitted.  Placed on cefepime given failure of outpatient Coverage.  Chest x-ray on admission looks similar to prior performed at the [facility] with mid to lower lung field interstitial markings on my interpretation.  About a week between the chest x-rays.  Not much clinical improvement.  Fever curve improved.  CTA chest performed today without PE but diffuse groundglass opacities on my interpretation.    Pertinent  Medical History  MS on immunosuppression drug Diastolic dysfunction  Recent COVID infection in 11/2020  Significant Hospital Events: Including procedures, antibiotic start and stop dates in addition to other pertinent events   7/20 admitted, hypoxemia, bilateral lower lobe interstitial appearing infiltrates on chest x-ray 7/24: Hypoxemia persists, elevated D-dimer, PCCM consulted CTA chest without PE but diffuse groundglass opacities  Interim History / Subjective:    Objective   Blood pressure 115/88, pulse 95,  temperature 98.3 F (36.8 C), temperature source Oral, resp. rate (!) 22, height 6\' 2"  (1.88 m), weight 93.9 kg, SpO2 90 %.    FiO2 (%):  [40 %] 40 %   Intake/Output Summary (Last 24 hours) at 01/12/2021 1429 Last data filed at 01/12/2021 1300 Gross per 24 hour  Intake 487.42 ml  Output --  Net 487.42 ml   Filed Weights   01/08/21 1435 01/09/21 1814  Weight: 92.5 kg 93.9 kg    Examination: General: Well-appearing lying in bed Eyes: EOMI, icterus Neck: Supple, no JVD appreciated Cardiovascular: Regular rhythm, no murmur Pulmonary: Mild crackles, normal work of breathing, no conversational dyspnea Abdomen: Nondistended, bowel sounds present MSK: No synovitis, joint effusion Neuro: No weakness, sensation intact Psych: Normal mood, full affect  Resolved Hospital Problem list     Assessment & Plan:  Acute hypoxemic respiratory failure: Due to diffuse parenchymal disease seen on CT scan. --Diuresis --Antibiotics per primary  Diffuse groundglass opacities: Unclear etiology.  Appears viral atypical infection.  He does not have cough to suggest acute infection currently, Failed outpatient course of Levaquin.  Chest x-ray reviewed on patient's iPad with similar appearance to admission chest x-ray, 7/14 compared to 7/20.  Worried this is developing sequela of COVID infection in late June 2022.  Possible developing fibrosis.  Other considerations are organized pneumonia, pulmonary edema.  Trial of diuresis.  If does not improve in the next 24 hours we will consider bronchoscopy to evaluate for atypical infections.  Notably on details negative which has a pretty good negative predictive  value for Aspergillus, PGP, yeast.  Respiratory panel negative although this could represent a new virus not detected on viral panel.  On bronchoscopy, if lymphocyte predominant would favor trial of steroids.  If normal or neutrophilic predominant, not sure if steroids would help much but may be worth a shot  regardless.  We will hold off on steroids now in order to maximize yield of possible bronchoscopy in the coming days.  If patient were to decompensate, would favor empiric steroid trial.  Best Practice (right click and "Reselect all SmartList Selections" daily)   Per primary  Labs   CBC: Recent Labs  Lab 01/08/21 1300 01/09/21 0414 01/10/21 0037 01/11/21 0049 01/12/21 0048  WBC 3.3* 3.4* 3.8* 4.2 4.3  NEUTROABS 2.6  --   --   --   --   HGB 11.8* 10.4* 10.3* 11.3* 11.1*  HCT 36.4* 31.5* 31.6* 33.1* 32.7*  MCV 86.5 86.1 86.3 84.4 84.3  PLT 205 187 211 224 628    Basic Metabolic Panel: Recent Labs  Lab 01/08/21 1300 01/09/21 0414 01/10/21 0037 01/11/21 0049 01/12/21 0048  NA 131* 135 134* 134* 133*  K 3.3* 3.9 3.4* 3.7 4.1  CL 100 105 104 105 100  CO2 19* 21* 22 22 24   GLUCOSE 183* 110* 107* 92 108*  BUN 7 <5* 6 6 7   CREATININE 0.96 0.80 0.80 0.74 0.79  CALCIUM 8.6* 8.3* 8.3* 8.5* 8.7*   GFR: Estimated Creatinine Clearance: 119.9 mL/min (by C-G formula based on SCr of 0.79 mg/dL). Recent Labs  Lab 01/08/21 1224 01/08/21 1300 01/08/21 1455 01/09/21 0414 01/09/21 1918 01/10/21 0037 01/11/21 0049 01/12/21 0048  PROCALCITON  --   --   --   --  0.13  --   --   --   WBC  --    < >  --  3.4*  --  3.8* 4.2 4.3  LATICACIDVEN 4.1*  --  2.2*  --   --   --   --   --    < > = values in this interval not displayed.    Liver Function Tests: Recent Labs  Lab 01/08/21 1300 01/09/21 0414 01/10/21 0037  AST 45* 39 33  ALT 28 25 23   ALKPHOS 84 73 65  BILITOT 0.5 0.6 0.4  PROT 6.8 5.6* 5.5*  ALBUMIN 2.9* 2.5* 2.5*   No results for input(s): LIPASE, AMYLASE in the last 168 hours. No results for input(s): AMMONIA in the last 168 hours.  ABG    Component Value Date/Time   PHART 7.493 (H) 03/06/2014 0154   PCO2ART 34.6 (L) 03/06/2014 0154   PO2ART 57.9 (L) 03/06/2014 0154   HCO3 26.3 (H) 03/06/2014 0154   TCO2 27.3 03/06/2014 0154   O2SAT 91.0 03/06/2014 0154      Coagulation Profile: Recent Labs  Lab 01/08/21 1428  INR 1.1    Cardiac Enzymes: No results for input(s): CKTOTAL, CKMB, CKMBINDEX, TROPONINI in the last 168 hours.  HbA1C: No results found for: HGBA1C  CBG: No results for input(s): GLUCAP in the last 168 hours.  Review of Systems:   Chest pain reproducible right costochondric area. No orthopnea or PND. No LE swelling.   Past Medical History:  He,  has a past medical history of Acid reflux, Arthritis, Aspiration pneumonia ([place]), Asthma, Delayed wound healing, Dyspnea, Enlarged prostate, Hypercapnic respiratory failure, chronic ([place]), Hypertension, and MS (multiple sclerosis) ([place]).   Surgical History:   Past Surgical History:  Procedure Laterality Date   ARTHROSCOPIC  HERNIA REPAIR     INCISION AND DRAINAGE Left 01/04/2017   Procedure: INCISION AND DRAINAGE LEFT KNEE;  Surgeon: Rod Can, MD;  Location: [facility];  Service: Orthopedics;  Laterality: Left;   jawsurgery     KNEE ARTHROPLASTY Left 08/10/2016   Procedure: LEFT TOTAL KNEE ARTHROPLASTY WITH COMPUTER NAVIGATION;  Surgeon: Rod Can, MD;  Location: [facility];  Service: Orthopedics;  Laterality: Left;   knees scope       Social History:   reports that he quit smoking about 14 years ago. His smoking use included cigarettes. He has a 20.00 pack-year smoking history. He has never used smokeless tobacco. He reports current alcohol use. He reports that he does not use drugs.   Family History:  His family history includes Dementia in his mother; Heart attack in his father; Hypertension in his father.   Allergies Allergies  Allergen Reactions   Penicillins Hives and Other (See Comments)    [ +FAMILY HISTORY] Has patient had a PCN reaction causing immediate rash, facial/tongue/throat swelling, SOB or lightheadedness with hypotension: Unknown Has patient had a PCN reaction causing severe rash involving mucus membranes or skin necrosis: Unknown Has patient had a  PCN reaction that required hospitalization: No Has patient had a PCN reaction occurring within the last 10 years: No If all of the above answers are "NO", then may proceed with Cephalosporin use.     Tape Rash   Zoloft [Sertraline] Rash   Food Nausea And Vomiting and Other (See Comments)    GREEN PEPPERS- flushed  PT DOES NOT EAT MEAT   Morphine Itching   Mushroom Extract Complex Nausea And Vomiting and Other (See Comments)    And flushed   Morphine And Related Itching   Penicillin G Rash     Home Medications  Prior to Admission medications   Medication Sig Start Date End Date Taking? Authorizing Provider  Acetaminophen-Codeine 300-30 MG tablet Take 1 tablet by mouth 2 (two) times daily as needed for pain. 08/26/20  Yes [provider]  albuterol (PROVENTIL HFA;VENTOLIN HFA) 108 (90 BASE) MCG/ACT inhaler Inhale 2 puffs into the lungs every 6 (six) hours as needed for wheezing or shortness of breath.   Yes [provider]  alfuzosin (UROXATRAL) 10 MG 24 hr tablet Take 10 mg by mouth daily with breakfast.   Yes [provider]  amLODipine (NORVASC) 10 MG tablet Take 5 mg by mouth at bedtime.   Yes [provider]  amphetamine-dextroamphetamine (ADDERALL) 30 MG tablet Take 30 mg by mouth 2 (two) times daily.   Yes [provider]  aspirin EC 81 MG tablet Take 81 mg by mouth at bedtime. Swallow whole.   Yes [provider]  baclofen (LIORESAL) 10 MG tablet Take 15 mg by mouth 4 (four) times daily.    Yes [provider]  Calcium Carbonate 500 (200 Ca) MG WAFR Take 1 tablet by mouth in the morning and at bedtime.   Yes [provider]  cetirizine (ZYRTEC) 10 MG tablet Take 10 mg by mouth daily.   Yes [provider]  Cholecalciferol (VITAMIN D) 50 MCG (2000 UT) tablet Take 2,000 Units by mouth daily.   Yes [provider]  cyanocobalamin (,VITAMIN B-12,) 1000 MCG/ML injection Inject 1,000 mcg into the  muscle once.   Yes [provider]  diclofenac Sodium (VOLTAREN) 1 % GEL Apply 2 g topically 2 (two) times daily as needed (pain). 11/27/19  Yes [provider]  dronabinol (MARINOL)  2.5 MG capsule Take 7.5 mg by mouth 3 (three) times daily.    Yes [provider]  esomeprazole (NEXIUM) 40 MG capsule Take 40 mg by mouth 2 (two) times daily before a meal.   Yes [provider]  famotidine (PEPCID) 40 MG tablet Take 40 mg by mouth at bedtime.   Yes [provider]  finasteride (PROSCAR) 5 MG tablet Take 5 mg by mouth daily.   Yes [provider]  fluorouracil (EFUDEX) 5 % cream Apply 1 application topically 2 (two) times daily.   Yes [provider]  fluticasone-salmeterol (ADVAIR) 250-50 MCG/ACT AEPB Inhale 1 puff into the lungs in the morning and at bedtime.   Yes [provider]  Folic Acid-Vit U2-GUR K27 (FOLBEE) 2.5-25-1 MG TABS tablet Take 1 tablet by mouth daily.   Yes [provider]  ipratropium (ATROVENT) 0.03 % nasal spray Place 2 sprays into both nostrils 2 (two) times daily. 11/13/19  Yes [provider]  ketotifen (ZADITOR) 0.025 % ophthalmic solution Place 1 drop into both eyes 2 (two) times daily as needed (itching).   Yes [provider]  losartan (COZAAR) 100 MG tablet Take 50 mg by mouth daily.   Yes [provider]  magnesium (MAGTAB) 84 MG (7MEQ) TBCR SR tablet Take 42 mg by mouth 2 (two) times daily.   Yes [provider]  metoCLOPramide (REGLAN) 10 MG tablet Take 10 mg by mouth daily with supper.   Yes [provider]  montelukast (SINGULAIR) 10 MG tablet Take 10 mg by mouth daily.   Yes [provider]  naloxone (NARCAN) nasal spray 4 mg/0.1 mL Place 1 spray into the nose See admin instructions. SPRAY 1 SPRAY INTO ONE NOSTRIL AS DIRECTED FOR OPIOID OVERDOSE (TURN PERSON ON SIDE AFTER DOSE. IF NO RESPONSE IN 2-3 MINUTES OR PERSON RESPONDS BUT  RELAPSES, REPEAT USING A NEW SPRAY DEVICE AND SPRAY INTO THE OTHER NOSTRIL. CALL 911 AFTER USE.) * EMERGENCY USE ONLY * 01/16/20  Yes [provider]  Omega-3 Fatty Acids (FISH OIL PO) Take 1 capsule by mouth daily.   Yes [provider]  ondansetron (ZOFRAN) 4 MG tablet Take 4 mg by mouth every 8 (eight) hours as needed for nausea or vomiting.   Yes [provider]  phenytoin (DILANTIN) 100 MG ER capsule Take 100 mg by mouth 2 (two) times daily. 07/18/20  Yes [provider]  pregabalin (LYRICA) 150 MG capsule Take 150 mg by mouth See admin instructions. 150mg  am, 150mg  noon, and 300mg  qhs.   Yes [provider]  Probiotic Product (ALIGN) 4 MG CAPS Take 4 mg by mouth in the morning and at bedtime.   Yes [provider]  sildenafil (VIAGRA) 100 MG tablet Take 100 mg by mouth daily as needed for erectile dysfunction.   Yes [provider]  simvastatin (ZOCOR) 10 MG tablet Take 10 mg by mouth at bedtime.   Yes [provider]  traMADol (ULTRAM) 50 MG tablet Take 50 mg by mouth every 6 (six) hours as needed for moderate pain or severe pain.   Yes [provider]  vitamin C (ASCORBIC ACID) 500 MG tablet Take 500 mg by mouth daily.   Yes [provider]  vitamin E 180 MG (400 UNITS) capsule Take 400 Units by mouth daily.   Yes [provider]  Zinc 50 MG TABS Take 50 mg by mouth every other day.   Yes [provider]  Critical care time:     N/a

## 2021-01-12 NOTE — Progress Notes (Signed)
HD#4 Subjective:  Overnight Events: No acute overnight events reported.    Interval History: [name] was evaluated at bedside this morning. He reports feeling better; however, continuing to have intermittent episodes of chills and hot flashes.   Objective:  Vital signs in last 24 hours: Vitals:   01/11/21 1916 01/11/21 2131 01/11/21 2141 01/12/21 0405  BP: 119/81   128/89  Pulse:    98  Resp: 20  20 20   Temp: 98.7 F (37.1 C)  (!) 100.8 F (38.2 C) 98.7 F (37.1 C)  TempSrc:   Oral Oral  SpO2:  94%  93%  Weight:      Height:       Supplemental O2: Nasal Cannula SpO2: 93 % O2 Flow Rate (L/min): 5 L/min FiO2 (%): 32 %   Physical Exam:  Physical Exam  Constitutional: Appears well-developed and well-nourished. No distress.  HENT: Normocephalic and atraumatic, EOMI, conjunctiva normal, moist mucous membranes Cardiovascular: Normal rate, regular rhythm, S1 and S2 present, no murmurs, rubs, gallops.  Distal pulses intact Respiratory: No respiratory distress, no accessory muscle use. Bibasilar crackles (R>L) on 5L [place].  GI: Nondistended, soft, nontender to palpation, normal active bowel sounds Musculoskeletal: Normal bulk and tone.  No peripheral edema noted. Neurological: Is alert and oriented x4, no apparent focal deficits noted. Skin: Warm and dry.  No rash, erythema, lesions noted. Psychiatric: Normal mood and affect. Behavior is normal. Judgment and thought content normal.   Filed Weights   01/08/21 1435 01/09/21 1814  Weight: 92.5 kg 93.9 kg     Intake/Output Summary (Last 24 hours) at 01/12/2021 5956 Last data filed at 01/11/2021 1500 Gross per 24 hour  Intake 487.42 ml  Output --  Net 487.42 ml   Net IO Since Admission: 3,471.38 mL [01/12/21 0638]  Pertinent Labs: CBC Latest Ref Rng & Units 01/12/2021 01/11/2021 01/10/2021  WBC 4.0 - 10.5 K/uL 4.3 4.2 3.8(L)  Hemoglobin 13.0 - 17.0 g/dL 11.1(L) 11.3(L) 10.3(L)  Hematocrit 39.0 - 52.0 % 32.7(L)  33.1(L) 31.6(L)  Platelets 150 - 400 K/uL 270 224 211    CMP Latest Ref Rng & Units 01/12/2021 01/11/2021 01/10/2021  Glucose 70 - 99 mg/dL 108(H) 92 107(H)  BUN 6 - 20 mg/dL 7 6 6   Creatinine 0.61 - 1.24 mg/dL 0.79 0.74 0.80  Sodium 135 - 145 mmol/L 133(L) 134(L) 134(L)  Potassium 3.5 - 5.1 mmol/L 4.1 3.7 3.4(L)  Chloride 98 - 111 mmol/L 100 105 104  CO2 22 - 32 mmol/L 24 22 22   Calcium 8.9 - 10.3 mg/dL 8.7(L) 8.5(L) 8.3(L)  Total Protein 6.5 - 8.1 g/dL - - 5.5(L)  Total Bilirubin 0.3 - 1.2 mg/dL - - 0.4  Alkaline Phos 38 - 126 U/L - - 65  AST 15 - 41 U/L - - 33  ALT 0 - 44 U/L - - 23    Imaging: No results found.  Assessment/Plan:   Active Problems:   CAP (community acquired pneumonia)   Patient Summary: Randy Greene is a 55 y.o. with a pertinent PMH of hypertension, hyperlipidemia, asthma, OSA, trigeminal neuralgia, and MS c/b anterograde amnesia who presented with dyspnea in setting of post-COVID pneumonia and admitted for sepsis.    Acute hypoxic respiratory failure  Post-COVID multifocal pneumonia Patient has been afebrile for past 24 hours and WBC is improved. Respiratory panel, legionella and strep pneumoniae negative. Blood cultures remain negative to date. Has had increasing oxygen requirements overnight and is currently up to 4-5L. Persistent rales on  exam. Patient has been refusing his lovenox and in light of recent COVID infection and initial presentation with chest pain, concern for possible pulmonary embolism. Patient has also been following with pulmonology as outpatient for pulmonary nodules and dyspnea that improved with albuterol therapy. Given no significant improvement and elevated D-dimer, will obtain CTA Chest to assess for possible PE and also consult pulmonology for further recommendations. 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  - Continue home dronabinol 2.5mg  and baclofen 10mg  4x/day - Metoclopramide 10mg  daily, zofran 4mg  q8h prn  Diarrhea Improved with imodium. No further episodes of diarrhea reported. - Imodium 2mg  prn  Hypertension Currently normotensive. Patient wants to resume cardura. Will switch out alpha antagonist at this time.  - Cardura 4mg  daily   Diet:  vegetarian IVF: None,None VTE: Enoxaparin Code: Full PT/OT recs: Home Health  Prior to Admission Living Arrangement: Home Anticipated Discharge Location: Home w/HH Barriers to Discharge: Continued medical management Dispo: Anticipated discharge to Home in 1 days pending clinical improvement.   Randy Heck, MD Internal Medicine Resident PGY-3 Pager# [phone]  Please contact the on call pager after 5 pm and on weekends at [phone].

## 2021-01-12 NOTE — Evaluation (Signed)
Physical Therapy Evaluation Patient Details Name: Randy Greene MRN: [id number] DOB: [birth year] Today's Date: 01/12/2021   History of Present Illness  55 yo admitted 7/20 with SOB and CP. Pt with Covid 3 weeks PTA then PNA. PMHx: MS, HTN, asthma, HLD, memory loss, BPH, Lt TKA  Clinical Impression   Pt admitted secondary to problem above with deficits below. PTA patient was living alone and functioning with assist of DME at modified independent level.  Pt currently requires supervision and max cues related to functioning with oxygen tubing. Per discussion with RN, she had educated pt on keeping his O2 on and yet he was standing at sink with it off and sats 81%. ?anterograde amnesia making it difficult for pt to learn new information related to managing with home O2.  In his case the home oxygen tubing will create a significant increased fall risk, as he showed poor attention to managing the tubing and his cane or rollator. He will be unable to manage standard portable O2 tank for community needs while also using his rollator (would benefit from over the shoulder style O2 for community needs). Anticipate patient will benefit from PT to address problems listed below.Will continue to follow acutely to maximize functional mobility independence and safety.    See separate O2 qualifying note in progress notes.      Follow Up Recommendations Home health PT (home safety evaluation)    Equipment Recommendations  None recommended by PT    Recommendations for Other Services OT consult (energy conservation and managing O2 during ADLs)     Precautions / Restrictions Precautions Precautions: Other (comment) Precaution Comments: desaturates      Mobility  Bed Mobility Overal bed mobility: Independent                  Transfers Overall transfer level: Needs assistance Equipment used: 4-wheeled walker;None Transfers: Sit to/from Stand Sit to Stand: Supervision         General transfer  comment: for tubing/lines (pt does not require physical assist)  Ambulation/Gait Ambulation/Gait assistance: Supervision Gait Distance (Feet): 300 Feet Assistive device: 4-wheeled walker Gait Pattern/deviations: WFL(Within Functional Limits)     General Gait Details: initially on 6L (had been on 5L at rest) with sats 96-97% and dropped to 4L with sats 93%  Stairs            Wheelchair Mobility    Modified Rankin (Stroke Patients Only)       Balance Overall balance assessment: Modified Independent                                           Pertinent Vitals/Pain      Home Living Family/patient expects to be discharged to:: Private residence Living Arrangements: Alone Available Help at Discharge: Family;Available PRN/intermittently Type of Home: Apartment Home Access: Level entry     Home Layout: One level Home Equipment: Walker - 4 wheels;Cane - single point;Tub bench;Electric scooter;Other (comment) (handicap Lucianne Lei) Additional Comments: information from prior medical record--need to confirm    Prior Function Level of Independence: Independent with assistive device(s)         Comments: using SPC in the home and electric scooter for community distances.     Hand Dominance   Dominant Hand: Right    Extremity/Trunk Assessment   Upper Extremity Assessment Upper Extremity Assessment: Defer to OT evaluation    Lower Extremity Assessment  Lower Extremity Assessment: Generalized weakness;LLE deficits/detail LLE Deficits / Details: weaker than rt due to MS    Cervical / Trunk Assessment Cervical / Trunk Assessment: Normal  Communication   Communication: No difficulties  Cognition Arousal/Alertness: Awake/alert Behavior During Therapy: WFL for tasks assessed/performed Overall Cognitive Status: No family/caregiver present to determine baseline cognitive functioning                                 General Comments: pt with  decr safety awareness and awareness of deficits (likely due to anterograde amnesia and difficulty learning new information). Poor awareness of need for O2, safety with lines/tubes      General Comments General comments (skin integrity, edema, etc.): pt up at sink bathing without his oxygen on with sats 81%. Patient with poor awareness/understanding of need for oxygen and not to remove it (RN reports she had previously gone over this information with pt. ?limited learning due to anterograde amnesia?)    Exercises     Assessment/Plan    PT Assessment Patient needs continued PT services  PT Problem List Decreased activity tolerance;Decreased safety awareness;Decreased knowledge of precautions;Cardiopulmonary status limiting activity       PT Treatment Interventions DME instruction;Gait training;Functional mobility training;Therapeutic activities;Cognitive remediation;Patient/family education    PT Goals (Current goals can be found in the Care Plan section)  Acute Rehab PT Goals Patient Stated Goal: go home without oxygen PT Goal Formulation: With patient Time For Goal Achievement: 01/26/21 Potential to Achieve Goals: Fair    Frequency Min 3X/week   Barriers to discharge Decreased caregiver support lives alone with ex-wife to check on him    Co-evaluation               AM-PAC PT "6 Clicks" Mobility  Outcome Measure Help needed turning from your back to your side while in a flat bed without using bedrails?: None Help needed moving from lying on your back to sitting on the side of a flat bed without using bedrails?: None Help needed moving to and from a bed to a chair (including a wheelchair)?: A Little Help needed standing up from a chair using your arms (e.g., wheelchair or bedside chair)?: A Little Help needed to walk in hospital room?: A Little Help needed climbing 3-5 steps with a railing? : A Little 6 Click Score: 20    End of Session Equipment Utilized During  Treatment: Oxygen Activity Tolerance: Treatment limited secondary to medical complications (Comment) (desats on room air) Patient left: in bed;with call bell/phone within reach Nurse Communication: Mobility status;Other (comment) (desats without O2; did well on 4L and remains on 4L) PT Visit Diagnosis: Difficulty in walking, not elsewhere classified (R26.2)    Time: 9604-5409 PT Time Calculation (min) (ACUTE ONLY): 28 min   Charges:   PT Evaluation $PT Eval Moderate Complexity: 1 Mod PT Treatments $Self Care/Home Management: 8-22         Arby Barrette, PT Pager [phone]   Rexanne Mano 01/12/2021, 9:37 AM

## 2021-01-12 NOTE — Plan of Care (Signed)

## 2021-01-12 NOTE — Progress Notes (Signed)
Pt refused cpap

## 2021-01-12 NOTE — TOC Progression Note (Addendum)
Transition of Care [facility]) - Progression Note    Patient Details  Name: Randy Greene MRN: [id number] Date of Birth: 04/14/1966  [facility] CM/SW Contact  Zenon Mayo, RN Phone Number: 01/12/2021, 11:04 AM  Clinical Narrative:    NCM spoke with patient at bedside, offered choice for HHPT, he does not have a preference.  NCM made referral to [facility] with Alvis Lemmings for HHPT, he can take referral.  Soc will begin once Josem Kaufmann is received from the [place].  Patient goes to [facility] and states PCP is Dr. Nyoka Cowden.  He will also need home oxygen at dc and the [place] will have to provide the oxygen so can not contact them until Monday. Patient not ready for dc per MD til Monday or Tuesday.   Expected Discharge Plan: [place] Barriers to Discharge: Continued Medical Work up  Expected Discharge Plan and Services Expected Discharge Plan: [place]   Discharge Planning Services: CM Consult Post Acute Care Choice: [place] arrangements for the past 2 months: [place]: PT [place]: [place] Date [place]: 01/12/21 Time [place]: 0998 Representative spoke with at [place]: [place] ([place]) Interventions    Readmission Risk Interventions No flowsheet data found.

## 2021-01-12 NOTE — Progress Notes (Signed)
OT Cancellation Note  Patient Details Name: Randy Greene MRN: [id number] DOB: 07-19-1965   Cancelled Treatment:    Reason Eval/Treat Not Completed: (Pt eating dinner/lunch).  Will reattempt. Nilsa Nutting., OTR/L Acute Rehabilitation Services Pager [phone] Office [phone]   Lucille Passy M 01/12/2021, 3:00 PM

## 2021-01-13 ENCOUNTER — Inpatient Hospital Stay (HOSPITAL_COMMUNITY): Payer: No Typology Code available for payment source

## 2021-01-13 DIAGNOSIS — J189 Pneumonia, unspecified organism: Secondary | ICD-10-CM | POA: Diagnosis not present

## 2021-01-13 LAB — CULTURE, BLOOD (ROUTINE X 2)
Culture: NO GROWTH
Culture: NO GROWTH
Special Requests: ADEQUATE
Special Requests: ADEQUATE

## 2021-01-13 LAB — CBC WITH DIFFERENTIAL/PLATELET
Abs Immature Granulocytes: 0.07 10*3/uL (ref 0.00–0.07)
Basophils Absolute: 0 10*3/uL (ref 0.0–0.1)
Basophils Relative: 0 %
Eosinophils Absolute: 0 10*3/uL (ref 0.0–0.5)
Eosinophils Relative: 0 %
HCT: 32.8 % — ABNORMAL LOW (ref 39.0–52.0)
Hemoglobin: 11.1 g/dL — ABNORMAL LOW (ref 13.0–17.0)
Immature Granulocytes: 2 %
Lymphocytes Relative: 9 %
Lymphs Abs: 0.4 10*3/uL — ABNORMAL LOW (ref 0.7–4.0)
MCH: 28.5 pg (ref 26.0–34.0)
MCHC: 33.8 g/dL (ref 30.0–36.0)
MCV: 84.3 fL (ref 80.0–100.0)
Monocytes Absolute: 0.8 10*3/uL (ref 0.1–1.0)
Monocytes Relative: 17 %
Neutro Abs: 3.3 10*3/uL (ref 1.7–7.7)
Neutrophils Relative %: 72 %
Platelets: 285 10*3/uL (ref 150–400)
RBC: 3.89 MIL/uL — ABNORMAL LOW (ref 4.22–5.81)
RDW: 13.9 % (ref 11.5–15.5)
WBC: 4.5 10*3/uL (ref 4.0–10.5)
nRBC: 0 % (ref 0.0–0.2)

## 2021-01-13 LAB — CBC
HCT: 32.4 % — ABNORMAL LOW (ref 39.0–52.0)
Hemoglobin: 10.8 g/dL — ABNORMAL LOW (ref 13.0–17.0)
MCH: 28.1 pg (ref 26.0–34.0)
MCHC: 33.3 g/dL (ref 30.0–36.0)
MCV: 84.4 fL (ref 80.0–100.0)
Platelets: 273 10*3/uL (ref 150–400)
RBC: 3.84 MIL/uL — ABNORMAL LOW (ref 4.22–5.81)
RDW: 13.7 % (ref 11.5–15.5)
WBC: 4.5 10*3/uL (ref 4.0–10.5)
nRBC: 0 % (ref 0.0–0.2)

## 2021-01-13 LAB — COMPREHENSIVE METABOLIC PANEL
ALT: 55 U/L — ABNORMAL HIGH (ref 0–44)
AST: 107 U/L — ABNORMAL HIGH (ref 15–41)
Albumin: 2.5 g/dL — ABNORMAL LOW (ref 3.5–5.0)
Alkaline Phosphatase: 97 U/L (ref 38–126)
Anion gap: 13 (ref 5–15)
BUN: 7 mg/dL (ref 6–20)
CO2: 24 mmol/L (ref 22–32)
Calcium: 8.7 mg/dL — ABNORMAL LOW (ref 8.9–10.3)
Chloride: 96 mmol/L — ABNORMAL LOW (ref 98–111)
Creatinine, Ser: 0.85 mg/dL (ref 0.61–1.24)
GFR, Estimated: >60 mL/min (ref >60)
Glucose, Bld: 107 mg/dL — ABNORMAL HIGH (ref 70–99)
Potassium: 3.8 mmol/L (ref 3.5–5.1)
Sodium: 133 mmol/L — ABNORMAL LOW (ref 135–145)
Total Bilirubin: 0.6 mg/dL (ref 0.3–1.2)
Total Protein: 6.5 g/dL (ref 6.5–8.1)

## 2021-01-13 MED ORDER — FUROSEMIDE 10 MG/ML IJ SOLN
40.0000 mg | Freq: Four times a day (QID) | INTRAMUSCULAR | Status: AC
  Administered 2021-01-13 (×2): 40 mg via INTRAVENOUS
  Filled 2021-01-13 (×2): qty 4

## 2021-01-13 NOTE — Progress Notes (Signed)
HD#5 SUBJECTIVE:  Patient Summary: Randy Greene is a 55 y.o. with a pertinent PMH of MS, HTN, HLD, BPH, and asthma, who presented with shortness of breath and admitted for multifocal pneumonia.   Overnight Events: No acute events overnight reported  Interim History: Randy Greene was evaluated at the bedside this morning. He continues to require supplemental O2 and most recently SpO2 was 93% on 5L [place]. Patient continues to feel short of breath and has productive cough. Also endorses fevers/chills still. Overall not much clinical improvement noted today.  OBJECTIVE:  Vital Signs: Vitals:   01/13/21 0419 01/13/21 0729 01/13/21 0823 01/13/21 1037  BP: 116/75 118/83  134/82  Pulse: 98 92  99  Resp: _0 Temp: (!) 100.6 F (38.1 C) 98.2 F (36.8 C)  99.5 F (37.5 C)  TempSrc: Axillary     SpO2: 100% 90% 92% 93%  Weight: 89.5 kg     Height:       Supplemental O2: Room Air SpO2: 93 % O2 Flow Rate (L/min): 5 L/min FiO2 (%): 40 %  Filed Weights   01/08/21 1435 01/09/21 1814 01/13/21 0419  Weight: 92.5 kg 93.9 kg 89.5 kg     Intake/Output Summary (Last 24 hours) at 01/13/2021 1234 Last data filed at 01/13/2021 1205 Gross per 24 hour  Intake 480 ml  Output 500 ml  Net -20 ml   Net IO Since Admission: 3,691.38 mL [01/13/21 1234]  Physical Exam: Physical Exam Constitutional:      General: He is not in acute distress.    Appearance: Normal appearance.  HENT:     Head: Normocephalic and atraumatic.  Eyes:     Pupils: Pupils are equal, round, and reactive to light.  Cardiovascular:     Rate and Rhythm: Normal rate and regular rhythm.     Pulses: Normal pulses.     Heart sounds: Normal heart sounds. No murmur heard. Pulmonary:     Effort: Pulmonary effort is normal. No respiratory distress.     Breath sounds: Rales present. No wheezing.  Abdominal:     General: Bowel sounds are normal. There is no distension.     Palpations: Abdomen is soft.     Tenderness: There  is no abdominal tenderness.  Musculoskeletal:     Right lower leg: No edema.     Left lower leg: No edema.  Skin:    General: Skin is warm and dry.  Neurological:     General: No focal deficit present.     Mental Status: He is alert and oriented to person, place, and time.    Patient Lines/Drains/Airways Status     Active Line/Drains/Airways     Name Placement date Placement time Site Days   Peripheral IV 01/12/21 20 G 1.88" Anterior;Right Forearm 01/12/21  1537  Forearm  1   Incision (Closed) 08/10/16 Knee Left 08/10/16  1317  -- 1617   Incision (Closed) 01/04/17 Knee Left 01/04/17  1523  -- 1470            Pertinent Labs: CBC Latest Ref Rng & Units 01/13/2021 01/12/2021 01/11/2021  WBC 4.0 - 10.5 K/uL 4.5 4.3 4.2  Hemoglobin 13.0 - 17.0 g/dL 10.8(L) 11.1(L) 11.3(L)  Hematocrit 39.0 - 52.0 % 32.4(L) 32.7(L) 33.1(L)  Platelets 150 - 400 K/uL 273 270 224    CMP Latest Ref Rng & Units 01/13/2021 01/12/2021 01/11/2021  Glucose 70 - 99 mg/dL 107(H) 108(H) 92  BUN 6 - 20 mg/dL 7 7  6  Creatinine 0.61 - 1.24 mg/dL 0.85 0.79 0.74  Sodium 135 - 145 mmol/L 133(L) 133(L) 134(L)  Potassium 3.5 - 5.1 mmol/L 3.8 4.1 3.7  Chloride 98 - 111 mmol/L 96(L) 100 105  CO2 22 - 32 mmol/L _0 Calcium 8.9 - 10.3 mg/dL 8.7(L) 8.7(L) 8.5(L)  Total Protein 6.5 - 8.1 g/dL 6.5 - -  Total Bilirubin 0.3 - 1.2 mg/dL 0.6 - -  Alkaline Phos 38 - 126 U/L 97 - -  AST 15 - 41 U/L 107(H) - -  ALT 0 - 44 U/L 55(H) - -    No results for input(s): GLUCAP in the last 72 hours.   Pertinent Imaging: CT Angio Chest Pulmonary Embolism (PE) W or WO Contrast  Result Date: 01/12/2021 CLINICAL DATA:  Elevated D-dimer levels, shortness of breath and hypoxia EXAM: CT ANGIOGRAPHY CHEST WITH CONTRAST TECHNIQUE: Multidetector CT imaging of the chest was performed using the standard protocol during bolus administration of intravenous contrast. Multiplanar CT image reconstructions and MIPs were obtained to evaluate the  vascular anatomy. CONTRAST:  75m OMNIPAQUE IOHEXOL 350 MG/ML SOLN COMPARISON:  Chest CT 10/30/2020 FINDINGS: Cardiovascular: No filling defect is identified in the pulmonary arterial tree to suggest pulmonary embolus. Atherosclerotic calcification of the aortic arch and of the left anterior descending coronary artery. Mild to moderate cardiomegaly. Mediastinum/Nodes: Small type 1 hiatal hernia. Right paratracheal node 0.9 cm in short axis on image 49 series 5, previously 0.4 cm. Subcarinal node 1.5 cm in short axis on image 62 series 5, previously 1.0 cm. Borderline right infrahilar adenopathy. Lungs/Pleura: Scattered bilateral airspace opacities in both lungs with associated ground-glass densities and no per particular predominant distribution. No definite secondary pulmonary lobular interstitial accentuation at the lung apices. The small right middle lobe pulmonary nodule seen on the prior exam is not identified today although could be obscured by surrounding airspace opacities. Upper Abdomen: Speckled calcifications in the spleen compatible with old granulomatous disease. Musculoskeletal: Thoracic spondylosis. Review of the MIP images confirms the above findings. IMPRESSION: 1. Bilateral geographic airspace opacities in the lungs with likely reactive or congested lymph nodes in the mediastinum. I favor cryptogenic organizing pneumonia over acute pulmonary edema although there is some underlying cardiomegaly noted. 2. No filling defect is identified in the pulmonary arterial tree to suggest pulmonary embolus. 3. Aortic Atherosclerosis (ICD10-I70.0). Left anterior descending coronary artery atherosclerosis. 4. Small type 1 hiatal hernia. Electronically Signed   By: WVan ClinesM.D.   On: 01/12/2021 14:02   DG Chest Port 1 View  Result Date: 01/13/2021 CLINICAL DATA:  Shortness of breath, hypoxia, pneumonia EXAM: PORTABLE CHEST 1 VIEW COMPARISON:  01/08/2021 FINDINGS: Compared to 5 days ago, there is  worsening bilateral peripheral mixed interstitial and airspace opacities compatible with multifocal pneumonia including atypical or viral pneumonia. Asymmetric edema is felt to be less likely. No large effusion or pneumothorax. Mild cardiac enlargement without CHF. Trachea midline. Degenerative changes of the spine. IMPRESSION: Bilateral mixed interstitial and airspace opacities compatible with multifocal pneumonia. Electronically Signed   By: MJerilynn Mages  Shick M.D.   On: 01/13/2021 11:07    ASSESSMENT/PLAN:  Assessment: Principal Problem:   Multifocal pneumonia  Randy Greene is a 55y.o. with a pertinent PMH of hypertension, hyperlipidemia, asthma, OSA, trigeminal neuralgia, and MS c/b anterograde amnesia who presented with dyspnea in setting of post-COVID pneumonia and admitted for sepsis.   #Acute hypoxic respiratory failure  #Nonspecific interstitial pneumonia #Post-COVID multifocal pneumonia Patient had a fever of 100.6 overnight,  no leukocytosis. Respiratory panel, legionella and strep pneumoniae negative. Blood cultures remain negative to date. Has had increasing oxygen requirements overnight and is currently on 5L . Persistent rales on exam. Patient had been refusing his lovenox, but received it today. In light of recent COVID infection and initial presentation with chest pain, there was concern for possible pulmonary embolism. CTA chest showing bilateral airspace opacities in the lungs likely 2/2 to cryptogenic organizing pneumonia vs acute pulm edema, no PE noted. Patient has also been following with pulmonology as outpatient for pulmonary nodules and dyspnea that improved with albuterol therapy.  - Pulmonology consulted, appreciate recommendations  - Lasix 40 mg x2 today  - NPO at midnight for bronch/BAL tomorrow  -Steroids to be started after bronch  -Discontinued abx - Check pulse ox with ambulation - Wean O2 as tolerated  Multiple sclerosis c/b anterograde amnesia Residual weakness on  left side and paresthesias, stable.  - Continue home dronabinol 2.6m and baclofen 124m4x/day - Metoclopramide 1090maily, zofran 4mg30mh prn  Diarrhea Improved with imodium. No further episodes of diarrhea reported. - Imodium 2mg 62m  Hypertension Currently normotensive. Patient wants to resume cardura. Will switch out alpha antagonist at this time.  - Cardura 4mg d42my   Diet: vegetarian IVF: None,None VTE: Enoxaparin Code: Full PT/OT recs: Home Health  Prior to Admission Living Arrangement: Home Anticipated Discharge Location: Home w/ HH Barriers to Discharge: Continued medical management Dispo: Anticipated discharge to Home in 1 days pending clinical improvement.   Signature: RayannBuddy Duty  Internal Medicine Resident, PGY-1 [facility]  Pager: #[phone] PM, 01/13/2021   Please contact the on call pager after 5 pm and on weekends at [phone]

## 2021-01-13 NOTE — Evaluation (Signed)
Occupational Therapy Evaluation Patient Details Name: Randy Greene MRN: [id number] DOB: 02/14/1966 Today's Date: 01/13/2021    History of Present Illness 55 yo admitted 7/20 with SOB and CP. Pt with Covid 3 weeks PTA then PNA. PMHx: MS, HTN, asthma, HLD, memory loss, BPH, Lt TKA   Clinical Impression   Pt was functioning modified independently at home where he resides alone. He uses a cane indoors and a 4WW outdoors, he also has a motorized w/c for longer distances. Pt demonstrated ability to mobilize and complete ADL with set up to supervision for safety and line. Educated in energy conservation and provided written handout. Instructed in fall prevention with 02 line in the event he returns home on 02. Pt receptive to all education. Will follow to reinforce education. Pt on 5L 02 with Sp02 of 92%, difficulty getting accurate reading during activity.     Follow Up Recommendations  Home health OT;Supervision - Intermittent (home safety/02 use)    Equipment Recommendations  None recommended by OT    Recommendations for Other Services       Precautions / Restrictions Precautions Precaution Comments: desaturates      Mobility Bed Mobility Overal bed mobility: Independent             General bed mobility comments: HOB up 20 degrees    Transfers Overall transfer level: Needs assistance Equipment used: 4-wheeled walker Transfers: Sit to/from [employer] Transfers Sit to Stand: Supervision Stand pivot transfers: Supervision       General transfer comment: supervision for lines    Balance Overall balance assessment: Modified Independent                                         ADL either performed or assessed with clinical judgement   ADL Overall ADL's : Needs assistance/impaired Eating/Feeding: Independent;Sitting   Grooming: Wash/dry hands;Standing;Supervision/safety   Upper Body Bathing: Set up;Sitting   Lower Body Bathing: Supervison/  safety;Sit to/from stand   Upper Body Dressing : Set up;Sitting   Lower Body Dressing: Supervision/safety;Sit to/from stand   Toilet Transfer: Supervision/safety;Ambulation   Toileting- Clothing Manipulation and Hygiene: Supervision/safety;Sit to/from stand       Functional mobility during ADLs: Supervision/safety;Rolling walker General ADL Comments: Educated in energy conservation, importance of mobility and use of IS.     Vision Patient Visual Report: No change from baseline       Perception     Praxis      Pertinent Vitals/Pain Pain Assessment: No/denies pain     Hand Dominance Right   Extremity/Trunk Assessment Upper Extremity Assessment Upper Extremity Assessment: Overall WFL for tasks assessed   Lower Extremity Assessment Lower Extremity Assessment: Defer to PT evaluation   Cervical / Trunk Assessment Cervical / Trunk Assessment: Normal   Communication Communication Communication: No difficulties   Cognition Arousal/Alertness: Awake/alert Behavior During Therapy: WFL for tasks assessed/performed Overall Cognitive Status: Within Functional Limits for tasks assessed                                 General Comments: educated in home safety with regard to 02 tubing, pt receptive   General Comments       Exercises     Shoulder Instructions      Home Living Family/patient expects to be discharged to:: Private residence Living Arrangements: Alone Available Help  at Discharge: Family;Available PRN/intermittently Type of Home: Apartment Home Access: Level entry     Home Layout: One level     Bathroom Shower/Tub: Teacher, early years/pre: Standard     Home Equipment: Environmental consultant - 4 wheels;Cane - single point;Tub bench;Electric scooter;Other (comment);Shower seat - built in (handicap Lucianne Lei)   Additional Comments: pt confirms information      Prior Functioning/Environment Level of Independence: Independent with assistive  device(s)        Comments: using SPC in the home, 4WW outside and electric scooter for community distances.        OT Problem List: Impaired balance (sitting and/or standing);Decreased activity tolerance;Cardiopulmonary status limiting activity      OT Treatment/Interventions: Self-care/ADL training;Energy conservation;Patient/family education    OT Goals(Current goals can be found in the care plan section) Acute Rehab OT Goals Patient Stated Goal: go home without oxygen OT Goal Formulation: With patient Time For Goal Achievement: 01/20/21 Potential to Achieve Goals: Good ADL Goals Pt Will Perform Grooming: with modified independence;standing Pt Will Perform Lower Body Bathing: with modified independence;sit to/from stand Pt Will Perform Lower Body Dressing: with modified independence;sit to/from stand Pt Will Transfer to Toilet: with modified independence;ambulating Pt Will Perform Toileting - Clothing Manipulation and hygiene: with modified independence;sit to/from stand Additional ADL Goal #1: Pt will state at least 3 energy conservation strategies as instructed. Additional ADL Goal #2: Pt will gather items necessary for ADL around his room managing 02 line.  OT Frequency: Min 2X/week   Barriers to D/C:            Co-evaluation              AM-PAC OT "6 Clicks" Daily Activity     Outcome Measure Help from another person eating meals?: None Help from another person taking care of personal grooming?: A Little Help from another person toileting, which includes using toliet, bedpan, or urinal?: A Little Help from another person bathing (including washing, rinsing, drying)?: A Little Help from another person to put on and taking off regular upper body clothing?: None Help from another person to put on and taking off regular lower body clothing?: A Little 6 Click Score: 20   End of Session Equipment Utilized During Treatment: Oxygen (5L)  Activity Tolerance: Patient  tolerated treatment well Patient left: with call bell/phone within reach;in chair  OT Visit Diagnosis: Other (comment) (decreased activity tolerance)                Time: 7616-0737 OT Time Calculation (min): 27 min Charges:  OT General Charges $OT Visit: 1 Visit OT Evaluation $OT Eval Low Complexity: 1 Low OT Treatments $Self Care/Home Management : 8-22 mins  Nestor Lewandowsky, OTR/L Acute Rehabilitation Services Pager: [phone] Office: [phone]  Malka So 01/13/2021, 12:35 PM

## 2021-01-13 NOTE — Progress Notes (Addendum)
Physical Therapy Treatment Patient Details Name: Randy Greene MRN: [id number] DOB: [birth year] Today's Date: 01/13/2021    History of Present Illness 55 yo admitted 7/20 with SOB and CP. Pt with Covid 3 weeks PTA then PNA. PMHx: MS, HTN, asthma, HLD, memory loss, BPH, Lt TKA    PT Comments    Planned to have patient ambulate in hallway and monitor SpO2 with activity. Pt sat up at EOB and c/o feeling very dizzy. Checked BP 85/56. Pt returned to supine. 1.5 hours prior to this BP was 134/82. BP back to 100/75 once returned to supine. And up to 110/74 after 3 minutes in supine. Pt on 5L of O2 with SpO2 88-91% with inconsistent pleth. Place pulse ox sensor on ear lobe with improved pleth and reading of 94-95%. Nursing aware.    Follow Up Recommendations  Home health PT     Equipment Recommendations  None recommended by PT    Recommendations for Other Services       Precautions / Restrictions Precautions Precautions: Other (comment) Precaution Comments: watch BP and SpO2    Mobility  Bed Mobility Overal bed mobility: Independent                 Transfers            General transfer comment: Unable due to lightheaded with low BP on sitting  Ambulation/Gait             General Gait Details: Unable due to lightheaded with low BP on sitting   Stairs             Wheelchair Mobility    Modified Rankin (Stroke Patients Only)       Balance Overall balance assessment: Modified Independent                                          Cognition Arousal/Alertness: Awake/alert Behavior During Therapy: WFL for tasks assessed/performed Overall Cognitive Status: Within Functional Limits for tasks assessed                                       Exercises      General Comments General comments (skin integrity, edema, etc.): See assessment for BP and O2      Pertinent Vitals/Pain Pain Assessment: No/denies pain     Home Living Family/patient expects to be discharged to:: Private residence Living Arrangements: Alone Available Help at Discharge: Family;Available PRN/intermittently Type of Home: Apartment Home Access: Level entry   Home Layout: One level Home Equipment: Walker - 4 wheels;Cane - single point;Tub bench;Electric scooter;Other (comment);Shower seat - built in (handicap Lucianne Lei) Additional Comments: pt confirms information    Prior Function Level of Independence: Independent with assistive device(s)      Comments: using SPC in the home, 4WW outside and electric scooter for community distances.   PT Goals (current goals can now be found in the care plan section) Acute Rehab PT Goals Patient Stated Goal: go home without oxygen Progress towards PT goals: Not progressing toward goals - comment (due to orthostatic)    Frequency    Min 3X/week      PT Plan Current plan remains appropriate    Co-evaluation              AM-PAC PT "6 Clicks" Mobility  Outcome Measure  Help needed turning from your back to your side while in a flat bed without using bedrails?: None Help needed moving from lying on your back to sitting on the side of a flat bed without using bedrails?: None Help needed moving to and from a bed to a chair (including a wheelchair)?: A Little Help needed standing up from a chair using your arms (e.g., wheelchair or bedside chair)?: A Little Help needed to walk in hospital room?: A Little Help needed climbing 3-5 steps with a railing? : A Little 6 Click Score: 20    End of Session Equipment Utilized During Treatment: Oxygen       PT Visit Diagnosis: Difficulty in walking, not elsewhere classified (R26.2)     Time: 6333-5456 PT Time Calculation (min) (ACUTE ONLY): 19 min  Charges:  $Therapeutic Exercise: 8-22 mins                     [place] Pager [phone] Office [place] 01/13/2021,  2:04 PM

## 2021-01-13 NOTE — Progress Notes (Signed)
Arrived to patient room. States "working again." Fran Lowes, RN VAST

## 2021-01-13 NOTE — Anesthesia Preprocedure Evaluation (Addendum)
Anesthesia Evaluation  Patient identified by MRN, date of birth, ID band Patient awake    Reviewed: Allergy & Precautions, NPO status , Patient's Chart, lab work & pertinent test results  Airway Mallampati: II  TM Distance: >3 FB Neck ROM: Full    Dental  (+) Teeth Intact   Pulmonary asthma , sleep apnea , pneumonia, unresolved, former smoker,    + rhonchi  + decreased breath sounds      Cardiovascular hypertension, Pt. on medications  Rhythm:Regular Rate:Normal     Neuro/Psych MS negative psych ROS   GI/Hepatic Neg liver ROS, GERD  Medicated,  Endo/Other  negative endocrine ROS  Renal/GU negative Renal ROS  negative genitourinary   Musculoskeletal  (+) Arthritis ,   Abdominal (+)  Abdomen: soft. Bowel sounds: normal.  Peds  Hematology negative hematology ROS (+)   Anesthesia Other Findings   Reproductive/Obstetrics                            Anesthesia Physical Anesthesia Plan  ASA: 3  Anesthesia Plan: General   Post-op Pain Management:    Induction: Intravenous  PONV Risk Score and Plan: 2 and Propofol infusion and Midazolam  Airway Management Planned: Mask and LMA  Additional Equipment: None  Intra-op Plan:   Post-operative Plan: Extubation in OR  Informed Consent: I have reviewed the patients History and Physical, chart, labs and discussed the procedure including the risks, benefits and alternatives for the proposed anesthesia with the patient or authorized representative who has indicated his/her understanding and acceptance.     Dental advisory given  Plan Discussed with:   Anesthesia Plan Comments: (Lab Results      Component                Value               Date                      WBC                      4.5                 01/13/2021                WBC                      4.5                 01/13/2021                HGB                      10.8 (L)             01/13/2021                HGB                      11.1 (L)            01/13/2021                HCT                      32.4 (L)            01/13/2021  HCT                      32.8 (L)            01/13/2021                MCV                      84.4                01/13/2021                MCV                      84.3                01/13/2021                PLT                      273                 01/13/2021                PLT                      285                 01/13/2021           Lab Results      Component                Value               Date                      NA                       133 (L)             01/13/2021                K                        3.8                 01/13/2021                CO2                      24                  01/13/2021                GLUCOSE                  107 (H)             01/13/2021                BUN                      7                   01/13/2021                CREATININE  0.85                01/13/2021                CALCIUM                  8.7 (L)             01/13/2021                GFRNONAA                 >60                 01/13/2021                GFRAA                    >60                 01/04/2017          )       Anesthesia Quick Evaluation

## 2021-01-13 NOTE — Consult Note (Addendum)
This is a progress note    NAME:  Randy Greene, MRN:  [id number], DOB:  08/22/1965, LOS: 5 ADMISSION DATE:  01/08/2021, CONSULTATION DATE:  01/13/21  REFERRING MD: IM TS, CHIEF COMPLAINT: Chest pain, dyspnea  History of Present Illness:  55 year old man admitted with hypoxemia.  Most recent pulmonary notes x2 reviewed.  H&P reviewed.  Most recent progress note reviewed.  Patient noted feeling ill lives town.  Returned home and tested positive for COVID at home end of 11/2020.  Present to the ED.   Notably O2 sat 93%.  No chest imaging performed.  Felt better after about a week.  Viral symptoms seem to improve.  However about a week later developed chest pain, dyspnea.  Not much cough.  Went to PCP at [facility].  Chest imaging at that time reviewed on patient's iPad with bilateral mid to lower lung field interstitial changes.  He was diagnosed with pneumonia.  Given 5 days of levofloxacin.  He continues to feel well.  This prompted presentation to ED.  There is hypoxemic on room air.  Placed on 3 to 4 L.  Admitted.  Placed on cefepime given failure of outpatient Coverage.  Chest x-ray on admission looks similar to prior performed at the [facility] with mid to lower lung field interstitial markings on my interpretation.  About a week between the chest x-rays.  Not much clinical improvement.  Fever curve improved.  CTA chest performed today without PE but diffuse groundglass opacities on my interpretation.    Pertinent  Medical History  MS on immunosuppression drug Diastolic dysfunction  Recent COVID infection in 11/2020  Significant Hospital Events: Including procedures, antibiotic start and stop dates in addition to other pertinent events   7/20 admitted, hypoxemia, bilateral lower lobe interstitial appearing infiltrates on chest x-ray 7/24: Hypoxemia persists, elevated D-dimer, PCCM consulted CTA chest without PE but diffuse groundglass opacities  Interim History / Subjective:  Says he feels a little better  today but still on a fair bit of O2.  Objective   Blood pressure 118/83, pulse 92, temperature 98.2 F (36.8 C), resp. rate 19, height _0  (1.88 m), weight 89.5 kg, SpO2 92 %.        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  Not immunosuppressed.  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   Treatment for this disease entity is usually just trial of steroids, time, and rule out concomitant issues (volume overload, other viral/bacterial pneumonias).  Viral panel neg. - NPO for bronch/BAL tomorrow - Will start steroids after bronch - Lasix 87m x 2 today - Wean O2 as tolerated - No need for abx - Encourage IS, progressive mobility - Will follow with you  DErskine EmeryMD PCCM

## 2021-01-14 ENCOUNTER — Encounter (HOSPITAL_COMMUNITY)
Admission: EM | Disposition: A | Discharge status: Home Health | Payer: Self-pay | Source: Ambulatory Visit | Attending: Internal Medicine

## 2021-01-14 ENCOUNTER — Inpatient Hospital Stay (HOSPITAL_COMMUNITY): Payer: No Typology Code available for payment source | Admitting: Anesthesiology

## 2021-01-14 DIAGNOSIS — J189 Pneumonia, unspecified organism: Secondary | ICD-10-CM | POA: Diagnosis not present

## 2021-01-14 HISTORY — PX: VIDEO BRONCHOSCOPY: SHX5072

## 2021-01-14 HISTORY — PX: BRONCHIAL WASHINGS: SHX5105

## 2021-01-14 LAB — BASIC METABOLIC PANEL
Anion gap: 12 (ref 5–15)
BUN: 10 mg/dL (ref 6–20)
CO2: 24 mmol/L (ref 22–32)
Calcium: 8.4 mg/dL — ABNORMAL LOW (ref 8.9–10.3)
Chloride: 93 mmol/L — ABNORMAL LOW (ref 98–111)
Creatinine, Ser: 0.79 mg/dL (ref 0.61–1.24)
GFR, Estimated: >60 mL/min (ref >60)
Glucose, Bld: 98 mg/dL (ref 70–99)
Potassium: 3.4 mmol/L — ABNORMAL LOW (ref 3.5–5.1)
Sodium: 129 mmol/L — ABNORMAL LOW (ref 135–145)

## 2021-01-14 LAB — CBC
HCT: 31.8 % — ABNORMAL LOW (ref 39.0–52.0)
Hemoglobin: 10.8 g/dL — ABNORMAL LOW (ref 13.0–17.0)
MCH: 28.3 pg (ref 26.0–34.0)
MCHC: 34 g/dL (ref 30.0–36.0)
MCV: 83.5 fL (ref 80.0–100.0)
Platelets: 311 10*3/uL (ref 150–400)
RBC: 3.81 MIL/uL — ABNORMAL LOW (ref 4.22–5.81)
RDW: 13.6 % (ref 11.5–15.5)
WBC: 4.4 10*3/uL (ref 4.0–10.5)
nRBC: 0 % (ref 0.0–0.2)

## 2021-01-14 LAB — BODY FLUID CELL COUNT WITH DIFFERENTIAL
Eos, Fluid: 0 %
Lymphs, Fluid: 36 %
Monocyte-Macrophage-Serous Fluid: 50 % (ref 50–90)
Neutrophil Count, Fluid: 14 % (ref 0–25)
Total Nucleated Cell Count, Fluid: 370 cu mm (ref 0–1000)

## 2021-01-14 LAB — C-REACTIVE PROTEIN: CRP: 23.3 mg/dL — ABNORMAL HIGH (ref <1.0)

## 2021-01-14 LAB — SEDIMENTATION RATE: Sed Rate: 74 mm/hr — ABNORMAL HIGH (ref 0–16)

## 2021-01-14 LAB — MAGNESIUM: Magnesium: 1.9 mg/dL (ref 1.7–2.4)

## 2021-01-14 SURGERY — VIDEO BRONCHOSCOPY WITHOUT FLUORO
Anesthesia: General | Laterality: Bilateral

## 2021-01-14 MED ORDER — METHYLPREDNISOLONE SODIUM SUCC 125 MG IJ SOLR
60.0000 mg | Freq: Three times a day (TID) | INTRAMUSCULAR | Status: AC
  Administered 2021-01-14 (×3): 60 mg via INTRAVENOUS
  Filled 2021-01-14 (×3): qty 2

## 2021-01-14 MED ORDER — MIDAZOLAM HCL 2 MG/2ML IJ SOLN
INTRAMUSCULAR | Status: DC | PRN
  Administered 2021-01-14: 2 mg via INTRAVENOUS

## 2021-01-14 MED ORDER — LIDOCAINE 2% (20 MG/ML) 5 ML SYRINGE
INTRAMUSCULAR | Status: DC | PRN
  Administered 2021-01-14: 80 mg via INTRAVENOUS

## 2021-01-14 MED ORDER — FENTANYL CITRATE (PF) 250 MCG/5ML IJ SOLN
INTRAMUSCULAR | Status: DC | PRN
  Administered 2021-01-14: 100 ug via INTRAVENOUS

## 2021-01-14 MED ORDER — METHYLPREDNISOLONE SODIUM SUCC 125 MG IJ SOLR
60.0000 mg | Freq: Every day | INTRAMUSCULAR | Status: DC
  Administered 2021-01-15 – 2021-01-16 (×2): 60 mg via INTRAVENOUS
  Filled 2021-01-14 (×2): qty 2

## 2021-01-14 MED ORDER — ACETAMINOPHEN 10 MG/ML IV SOLN
1000.0000 mg | Freq: Once | INTRAVENOUS | Status: AC
  Administered 2021-01-14: 1000 mg via INTRAVENOUS
  Filled 2021-01-14: qty 100

## 2021-01-14 MED ORDER — PROPOFOL 10 MG/ML IV BOLUS
INTRAVENOUS | Status: DC | PRN
  Administered 2021-01-14: 250 mg via INTRAVENOUS

## 2021-01-14 MED ORDER — ONDANSETRON HCL 4 MG/2ML IJ SOLN
INTRAMUSCULAR | Status: DC | PRN
  Administered 2021-01-14: 4 mg via INTRAVENOUS

## 2021-01-14 MED ORDER — LACTATED RINGERS IV SOLN: INTRAVENOUS | Status: DC | PRN

## 2021-01-14 NOTE — Progress Notes (Signed)
HD#6 SUBJECTIVE:  Patient Summary: Randy Greene is a 55 y.o. with a pertinent PMH of MS, HTN, HLD, BPH, and asthma, who presented with shortness of breath and admitted for multifocal pneumonia.  Overnight Events: Patient had fever of 100.73F overnight, up to 102.102F this morning. Otherwise no acute events overnight.  Interim History: Randy Greene was seen and evaluated at the bedside this morning. Went for bronch/BAL this morning and states that he feels well, but endorses a scratchy throat after the procedure. Patient continues to feel short of breath and has been requiring supplemental O2, most recent SpO2 93% on 5L [place].   OBJECTIVE:  Vital Signs: Vitals:   01/13/21 2050 01/13/21 2342 01/14/21 0343 01/14/21 0724  BP:  123/83 130/83 128/81  Pulse:  (!) 102 90 (!) 104  Resp:  _0 Temp:  100 F (37.8 C) (!) 100.5 F (38.1 C) (!) 102.6 F (39.2 C)  TempSrc:  Oral Oral Temporal  SpO2: 96% 90% 92% 93%  Weight:      Height:       Supplemental O2: Nasal Cannula SpO2: 93 % O2 Flow Rate (L/min): 5 L/min FiO2 (%): 40 %  Filed Weights   01/08/21 1435 01/09/21 1814 01/13/21 0419  Weight: 92.5 kg 93.9 kg 89.5 kg     Intake/Output Summary (Last 24 hours) at 01/14/2021 0727 Last data filed at 01/14/2021 0051 Gross per 24 hour  Intake --  Output 900 ml  Net -900 ml   Net IO Since Admission: 3,291.38 mL [01/14/21 0727]  Physical Exam: Physical Exam Constitutional:      General: He is not in acute distress.    Appearance: He is well-developed.  HENT:     Head: Normocephalic and atraumatic.  Cardiovascular:     Rate and Rhythm: Normal rate and regular rhythm.     Heart sounds: Normal heart sounds. No murmur heard. Pulmonary:     Effort: Pulmonary effort is normal. No respiratory distress.     Breath sounds: Rales present. No wheezing or rhonchi.  Abdominal:     General: Bowel sounds are normal.     Palpations: Abdomen is soft.     Tenderness: There is no abdominal  tenderness.  Musculoskeletal:        General: Normal range of motion.     Right lower leg: No edema.     Left lower leg: No edema.  Skin:    General: Skin is warm and dry.  Neurological:     General: No focal deficit present.     Mental Status: He is alert and oriented to person, place, and time.    Patient Lines/Drains/Airways Status     Active Line/Drains/Airways     Name Placement date Placement time Site Days   Peripheral IV 01/12/21 Anterior;Right Forearm 01/12/21  1200  Forearm  2   Incision (Closed) 08/10/16 Knee Left 08/10/16  1317  -- 1618   Incision (Closed) 01/04/17 Knee Left 01/04/17  1523  -- 1471            Pertinent Labs: CBC Latest Ref Rng & Units 01/14/2021 01/13/2021 01/13/2021  WBC 4.0 - 10.5 K/uL 4.4 4.5 4.5  Hemoglobin 13.0 - 17.0 g/dL 10.8(L) 11.1(L) 10.8(L)  Hematocrit 39.0 - 52.0 % 31.8(L) 32.8(L) 32.4(L)  Platelets 150 - 400 K/uL 311 285 273    CMP Latest Ref Rng & Units 01/14/2021 01/13/2021 01/12/2021  Glucose 70 - 99 mg/dL 98 107(H) 108(H)  BUN 6 - 20 mg/dL  _0 Creatinine 0.61 - 1.24 mg/dL 0.79 0.85 0.79  Sodium 135 - 145 mmol/L 129(L) 133(L) 133(L)  Potassium 3.5 - 5.1 mmol/L 3.4(L) 3.8 4.1  Chloride 98 - 111 mmol/L 93(L) 96(L) 100  CO2 22 - 32 mmol/L _1 Calcium 8.9 - 10.3 mg/dL 8.4(L) 8.7(L) 8.7(L)  Total Protein 6.5 - 8.1 g/dL - 6.5 -  Total Bilirubin 0.3 - 1.2 mg/dL - 0.6 -  Alkaline Phos 38 - 126 U/L - 97 -  AST 15 - 41 U/L - 107(H) -  ALT 0 - 44 U/L - 55(H) -    No results for input(s): GLUCAP in the last 72 hours.   Pertinent Imaging: DG Chest Port 1 View  Result Date: 01/13/2021 CLINICAL DATA:  Shortness of breath, hypoxia, pneumonia EXAM: PORTABLE CHEST 1 VIEW COMPARISON:  01/08/2021 FINDINGS: Compared to 5 days ago, there is worsening bilateral peripheral mixed interstitial and airspace opacities compatible with multifocal pneumonia including atypical or viral pneumonia. Asymmetric edema is felt to be less likely. No  large effusion or pneumothorax. Mild cardiac enlargement without CHF. Trachea midline. Degenerative changes of the spine. IMPRESSION: Bilateral mixed interstitial and airspace opacities compatible with multifocal pneumonia. Electronically Signed   By: Jerilynn Mages.  Shick M.D.   On: 01/13/2021 11:07    ASSESSMENT/PLAN:  Assessment: Principal Problem:   Multifocal pneumonia   Plan: Randy Greene is a 55 y.o. with a pertinent PMH of hypertension, hyperlipidemia, asthma, OSA, trigeminal neuralgia, and MS c/b anterograde amnesia who presented with dyspnea in setting of post-COVID pneumonia and admitted for sepsis.   #Acute hypoxic respiratory failure  #Nonspecific interstitial pneumonia #Post-COVID multifocal pneumonia Patient had a fever of 102.6 this morning, no leukocytosis. Respiratory panel, legionella and strep pneumoniae negative. Blood cultures remain negative to date. Continues to require supplemental O2 and currently on 5L [place]. Persistent rales on exam. In light of recent COVID infection and initial presentation with chest pain, there was concern for possible pulmonary embolism. CTA chest showing bilateral airspace opacities in the lungs likely 2/2 to cryptogenic organizing pneumonia vs acute pulm edema, no PE noted. Patient has also been following with pulmonology as outpatient for pulmonary nodules and dyspnea that improved with albuterol therapy. Bronch/BAL performed 7/26 showing normal appearing airways with no evidence of inflammation. RUL BAL results pending.  - Pulmonology consulted, appreciate recommendations  - Received 2 doses IV lasix 40 mg yesterday  - Weight down ~3.5kg since admission  - Empiric steroids: solumedrol 80m q8h x3 doses followed by 631mdaily   - Discontinued abx 7/25 - Check pulse ox with ambulation - Wean O2 as tolerated  #Multiple sclerosis c/b anterograde amnesia Residual weakness on left side and paresthesias, stable.  - Continue home dronabinol 2.68m76mnd baclofen  22m1m/day - Metoclopramide 22mg62mly, zofran 4mg q50mprn  #Diarrhea, resolved Improved with imodium. No further episodes of diarrhea reported. - Imodium 2mg pr54m#Hypertension Currently normotensive. Patient wants to resume cardura. Will switch out alpha antagonist at this time.  - Cardura 4mg dai47m  Diet: vegetarian IVF: None,None VTE: Enoxaparin Code: Full PT/OT recs: Home Health  Prior to Admission Living Arrangement: Home Anticipated Discharge Location: Home w/ HH Barriers to Discharge: Continued medical management Dispo: Anticipated discharge to Home in 1 days pending clinical improvement.   Signature: [place] Audia ABuddy DutyInternal Medicine Resident, PGY-1 [facility]  Pager: #[phone], 01/14/2021   Please contact the on call pager after 5 pm  and on weekends at [phone].

## 2021-01-14 NOTE — Procedures (Signed)
Bronchoscopy Procedure Note  Randy Greene  [id number]  02-Oct-1965  Date:01/14/21  Time:8:16 AM   Provider Performing:Coston Mandato C Tamala Julian   Procedure(s):  Flexible bronchoscopy with bronchial alveolar lavage (40086)  Indication(s) ILD  Consent Risks of the procedure as well as the alternatives and risks of each were explained to the patient and/or caregiver.  Consent for the procedure was obtained and is signed in the bedside chart  Anesthesia General   Time Out Verified patient identification, verified procedure, site/side was marked, verified correct patient position, special equipment/implants available, medications/allergies/relevant history reviewed, required imaging and test results available.   Sterile Technique Usual hand hygiene, masks, gowns, and gloves were used   Procedure Description Bronchoscope advanced through endotracheal tube and into airway.  Airways were examined down to subsegmental level with findings noted below.   Following diagnostic evaluation, BAL(s) performed in RUL apical subsegment with normal saline and return of slightly cloudy fluid  Findings:  - normal appearing airways, no evidence of inflammation  Complications/Tolerance None; patient tolerated the procedure well. Chest X-ray is not needed post procedure.   EBL Minimal   Specimen(s) RUL BAL

## 2021-01-14 NOTE — Plan of Care (Signed)

## 2021-01-14 NOTE — Transfer of Care (Signed)
Immediate Anesthesia Transfer of Care Note  Patient: Randy Greene  Procedure(s) Performed: VIDEO BRONCHOSCOPY WITHOUT FLUORO (Bilateral) BRONCHIAL WASHINGS  Patient Location: Endoscopy Unit  Anesthesia Type:General  Level of Consciousness: awake, alert  and oriented  Airway & Oxygen Therapy: Patient Spontanous Breathing and Patient connected to face mask oxygen  Post-op Assessment: Report given to RN, Post -op Vital signs reviewed and stable and Patient moving all extremities X 4  Post vital signs: Reviewed and stable  Last Vitals:  Vitals Value Taken Time  BP 114/75 01/14/21 0826  Temp    Pulse 96 01/14/21 0829  Resp 25 01/14/21 0829  SpO2 93 % 01/14/21 0829  Vitals shown include unvalidated device data.  Last Pain:  Vitals:   01/14/21 0724  TempSrc: Temporal  PainSc: 0-No pain         Complications: No notable events documented.

## 2021-01-14 NOTE — Plan of Care (Signed)

## 2021-01-14 NOTE — Progress Notes (Signed)
Patient refused CPAP for the night  

## 2021-01-14 NOTE — Progress Notes (Signed)
01/14/2021   I have seen and evaluated the patient for abnormal CT>  S:  No events, spiked fever this AM in preop. Really wants to go home. Denies chest pain, cough.  O: Blood pressure 128/81, pulse (!) 104, temperature (!) 102.6 F (39.2 C), temperature source Temporal, resp. rate 12, height _0  (1.88 m), weight 89.5 kg, SpO2 93 %.  No distress Crackles on exam Heart rate tachycardic Good insight  Minimal diuresis Sodium low CBC stable CRP 23 PCT, fungitell neg  A:  NSIP- see discussion yesterday.  P:  - Bronch w/ BAL, empiric steroids - f/u remaining rheum labs - encourage IS, wean O2 to sats 90% - will follow with you    Erskine Emery MD [facility] Prefer epic messenger for cross cover needs If after hours, please call E-link

## 2021-01-14 NOTE — Progress Notes (Signed)
PT Cancellation Note  Patient Details Name: Randy Greene MRN: [id number] DOB: 08/08/1965   Cancelled Treatment:    Reason Eval/Treat Not Completed: Patient at procedure or test/unavailable   Lenzie Sandler B Alexavier Tsutsui 01/14/2021, 7:36 AM Bayard Males, PT Acute Rehabilitation Services Pager: [phone] Office: [phone]

## 2021-01-14 NOTE — Anesthesia Procedure Notes (Signed)
Procedure Name: Intubation Date/Time: 01/14/2021 8:09 AM Performed by: Mariea Clonts, CRNA Pre-anesthesia Checklist: Patient identified, Emergency Drugs available, Suction available and Patient being monitored Patient Re-evaluated:Patient Re-evaluated prior to induction Oxygen Delivery Method: Circle System Utilized Preoxygenation: Pre-oxygenation with 100% oxygen Induction Type: IV induction Laryngoscope Size: Miller and 3 Grade View: Grade I Tube type: Oral Tube size: 8.5 mm Number of attempts: 1 Airway Equipment and Method: Stylet and Oral airway Placement Confirmation: ETT inserted through vocal cords under direct vision, positive ETCO2 and breath sounds checked- equal and bilateral Tube secured with: Tape Dental Injury: Teeth and Oropharynx as per pre-operative assessment

## 2021-01-15 ENCOUNTER — Inpatient Hospital Stay (HOSPITAL_COMMUNITY): Payer: No Typology Code available for payment source

## 2021-01-15 DIAGNOSIS — J189 Pneumonia, unspecified organism: Secondary | ICD-10-CM | POA: Diagnosis not present

## 2021-01-15 LAB — BASIC METABOLIC PANEL
Anion gap: 10 (ref 5–15)
BUN: 14 mg/dL (ref 6–20)
CO2: 25 mmol/L (ref 22–32)
Calcium: 8.9 mg/dL (ref 8.9–10.3)
Chloride: 97 mmol/L — ABNORMAL LOW (ref 98–111)
Creatinine, Ser: 0.69 mg/dL (ref 0.61–1.24)
GFR, Estimated: >60 mL/min (ref >60)
Glucose, Bld: 194 mg/dL — ABNORMAL HIGH (ref 70–99)
Potassium: 4 mmol/L (ref 3.5–5.1)
Sodium: 132 mmol/L — ABNORMAL LOW (ref 135–145)

## 2021-01-15 LAB — CBC
HCT: 34.7 % — ABNORMAL LOW (ref 39.0–52.0)
Hemoglobin: 11.5 g/dL — ABNORMAL LOW (ref 13.0–17.0)
MCH: 28.2 pg (ref 26.0–34.0)
MCHC: 33.1 g/dL (ref 30.0–36.0)
MCV: 85 fL (ref 80.0–100.0)
Platelets: 338 10*3/uL (ref 150–400)
RBC: 4.08 MIL/uL — ABNORMAL LOW (ref 4.22–5.81)
RDW: 13.5 % (ref 11.5–15.5)
WBC: 4.1 10*3/uL (ref 4.0–10.5)
nRBC: 0 % (ref 0.0–0.2)

## 2021-01-15 LAB — ACID FAST SMEAR (AFB, MYCOBACTERIA): Acid Fast Smear: NEGATIVE

## 2021-01-15 LAB — ANCA TITERS
Atypical P-ANCA titer: <1:20 {titer}
C-ANCA: <1:20 {titer}
P-ANCA: <1:20 {titer}

## 2021-01-15 LAB — ALDOLASE: Aldolase: 12.3 U/L — ABNORMAL HIGH (ref 3.3–10.3)

## 2021-01-15 LAB — MAGNESIUM: Magnesium: 2.4 mg/dL (ref 1.7–2.4)

## 2021-01-15 LAB — ANA W/REFLEX IF POSITIVE: Anti Nuclear Antibody (ANA): NEGATIVE

## 2021-01-15 LAB — GLUCOSE, CAPILLARY: Glucose-Capillary: 145 mg/dL — ABNORMAL HIGH (ref 70–99)

## 2021-01-15 MED ORDER — PREDNISONE 10 MG PO TABS: 40.0000 mg | ORAL_TABLET | Freq: Every day | ORAL | 0 refills | Status: AC

## 2021-01-15 MED ORDER — SODIUM CHLORIDE 0.9 % IV BOLUS: 500.0000 mL | Freq: Once | INTRAVENOUS | Status: DC

## 2021-01-15 NOTE — Progress Notes (Signed)
Physical Therapy Treatment Patient Details Name: Randy Greene MRN: [id number] DOB: 09-15-1965 Today's Date: 01/15/2021    History of Present Illness 55 yo admitted 7/20 with SOB and CP. Pt with Covid 3 weeks PTA then PNA. PMHx: MS, HTN, asthma, HLD, memory loss, self-reported ADHD, BPH, Lt TKA.    PT Comments    Pt received in semi-sidelying, agreeable to therapy session and with good participation and fair tolerance for transfer training. Pt standing tolerance limited due to symptomatic orthostatic hypotension (RN notified) and desat to 86% with mobility tasks on 6L [place].  Orthostatic BPs  Supine 106/82 (90) HR 90's bpm  Sitting 90/67 (76)  Standing 60/53 (57); dizzy, HR to 120's bpm  Sitting after pivot to chair 84/64 (73); less dizzy, HR 110-117 bpm  Pt continues to benefit from PT services to progress toward functional mobility goals. Will continue to assess disposition pending progress, RN notified pt may benefit from trial of compression stocking for improved hemodynamic stability when standing. Will plan to progress standing/gait tasks next session if BP more stable.    Follow Up Recommendations  Home health PT;Supervision for mobility/OOB (pending progress (soft BP this week))     Equipment Recommendations  None recommended by PT    Recommendations for Other Services       Precautions / Restrictions Precautions Precautions: Other (comment) Precaution Comments: watch BP and SpO2 Restrictions Weight Bearing Restrictions: No    Mobility  Bed Mobility Overal bed mobility: Independent    Transfers Overall transfer level: Needs assistance Equipment used: 4-wheeled walker Transfers: Sit to/from Stand;Stand Pivot Transfers Sit to Stand: Supervision Stand pivot transfers: Min guard       General transfer comment: min guard for safety due to low standing BP but no LOB observed; mild dizziness reported.  Ambulation/Gait     General Gait Details: Unable due to  lightheaded with low BP on standing; limited mobility to stand pivot transfer with min guard/4WW        Balance    Limited assessment, pt orthostatic min guard for safety.         Cognition Arousal/Alertness: Awake/alert Behavior During Therapy: WFL for tasks assessed/performed Overall Cognitive Status: Within Functional Limits for tasks assessed              General Comments: pt c/o increased brain fog after recent Covid infection (also states he has ADHD), good following of 1 and 2-step commands, pt asking for call bell once in chair but was already placed in his lap.      Exercises Other Exercises Other Exercises: supine BLE AROM: ankle pumps x10 reps Other Exercises: seated BLE AROM: ankle pumps; seated BUE AROM: chest press x10 reps ea for improved hemodynamics    General Comments General comments (skin integrity, edema, etc.): see orthostatics in summary above, pt symptomatic of dizziness with low BP in stance      Pertinent Vitals/Pain Pain Assessment: No/denies pain     PT Goals (current goals can now be found in the care plan section) Acute Rehab PT Goals Patient Stated Goal: go home without oxygen PT Goal Formulation: With patient Time For Goal Achievement: 01/26/21 Progress towards PT goals: Progressing toward goals (progress limited due to unstable BP)    Frequency    Min 3X/week      PT Plan Current plan remains appropriate       AM-PAC PT "6 Clicks" Mobility   Outcome Measure  Help needed turning from your back to your side while in  a flat bed without using bedrails?: None Help needed moving from lying on your back to sitting on the side of a flat bed without using bedrails?: None Help needed moving to and from a bed to a chair (including a wheelchair)?: A Little Help needed standing up from a chair using your arms (e.g., wheelchair or bedside chair)?: A Little Help needed to walk in hospital room?: A Little Help needed climbing 3-5 steps  with a railing? : A Lot 6 Click Score: 19    End of Session Equipment Utilized During Treatment: Oxygen;Gait belt Activity Tolerance: Treatment limited secondary to medical complications (Comment) (tachy and orthostatic hypotension limiting mobility) Patient left: in chair;with call bell/phone within reach Nurse Communication: Mobility status;Other (comment) (orthostatic hypotension, may be beneficial to trial TED hose?, tachy) PT Visit Diagnosis: Difficulty in walking, not elsewhere classified (R26.2)     Time: 5859-2924 PT Time Calculation (min) (ACUTE ONLY): 21 min  Charges:  $Therapeutic Activity: 8-22 mins                     Izel Hochberg P., PTA Acute Rehabilitation Services Pager: [phone] Office: [place] 01/15/2021, 12:35 PM

## 2021-01-15 NOTE — Discharge Instructions (Addendum)
FOLLOW-UP INSTRUCTIONS:  Thank you for allowing [place] to be part of your care. You were hospitalized for Multifocal pneumonia.  Please follow up with the following providers: A. [place], [address] [place] [address], [phone]  B. [facility] in 1 week: Please call to make an appt.  [phone]  C. Home health services will contact you for an appt  Please note these changes made to your medications:   Please start prednisone 40mg  daily x7 days until you follow up with the pulmonology      A. Medications to discontinue until you follow up with your primary care physician:  Amlodipine  Losartan Slidenafil  Please make sure to see the pulmonologist in 1 week.   Please call our clinic if you have any questions or concerns, we may be able to help and keep you from a long and expensive emergency room wait. Our clinic and after hours phone number is [phone], the best time to call is Monday through Friday 9 am to 4 pm but there is always someone available 24/7 if you have an emergency. If you need medication refills please notify your pharmacy one week in advance and they will send [place] a request.

## 2021-01-15 NOTE — Progress Notes (Addendum)
SATURATION QUALIFICATIONS: (This note is used to comply with regulatory documentation for home oxygen)  Patient Saturations on Room Air at Rest = 81%  Patient Saturations on Room Air while Ambulating = NA  Patient Saturations on 6 Liters of oxygen while Ambulating = 86%  Please briefly explain why patient needs home oxygen: Pt at rest needs 5L of 02 to maintain sats above 88%. Pt needs O2 to maintain oxygen saturation >88% during functional activities.

## 2021-01-15 NOTE — Progress Notes (Signed)
This is a progress note    NAME:  Randy Greene, MRN:  [id number], DOB:  [birth year], LOS: 7 ADMISSION DATE:  01/08/2021, CONSULTATION DATE:  01/15/21  REFERRING MD: IM TS, CHIEF COMPLAINT: Chest pain, dyspnea  History of Present Illness:  55 year old man admitted with hypoxemia.  Most recent pulmonary notes x2 reviewed.  H&P reviewed.  Most recent progress note reviewed.  Patient noted feeling ill lives town.  Returned home and tested positive for COVID at home end of 11/2020.  Present to the ED.   Notably O2 sat 93%.  No chest imaging performed.  Felt better after about a week.  Viral symptoms seem to improve.  However about a week later developed chest pain, dyspnea.  Not much cough.  Went to PCP at [facility].  Chest imaging at that time reviewed on patient's iPad with bilateral mid to lower lung field interstitial changes.  He was diagnosed with pneumonia.  Given 5 days of levofloxacin.  He continues to feel well.  This prompted presentation to ED.  There is hypoxemic on room air.  Placed on 3 to 4 L.  Admitted.  Placed on cefepime given failure of outpatient Coverage.  Chest x-ray on admission looks similar to prior performed at the [facility] with mid to lower lung field interstitial markings on my interpretation.  About a week between the chest x-rays.  Not much clinical improvement.  Fever curve improved.  CTA chest performed today without PE but diffuse groundglass opacities on my interpretation.  Pertinent  Medical History  MS on immunosuppression drug Diastolic dysfunction  Recent COVID infection in 11/2020  Significant Hospital Events:  7/20 admitted, hypoxemia, bilateral lower lobe interstitial appearing infiltrates on chest x-ray 7/24: Hypoxemia persists, elevated D-dimer, PCCM consulted CTA chest without PE but diffuse groundglass opacities 7/26: Bronch with BAL with normal appearing airways   Interim History / Subjective:  No acute events overnight  States dyspnea has improved and right chest pain  have resolved  Diuresed well with lasix ~2.2L removed   Objective   Blood pressure 119/79, pulse 81, temperature 97.6 F (36.4 C), temperature source Oral, resp. rate 19, height _0  (1.88 m), weight 89.5 kg, SpO2 93 %.        Intake/Output Summary (Last 24 hours) at 01/15/2021 0806 Last data filed at 01/15/2021 0330 Gross per 24 hour  Intake 500 ml  Output 2250 ml  Net -1750 ml    Filed Weights   01/08/21 1435 01/09/21 1814 01/13/21 0419  Weight: 92.5 kg 93.9 kg 89.5 kg    Examination: General: Pleasant adult male lying in bed in NAD HEENT: [place]/AT, MM pink/moist, PERRL,  Neuro: Alert and oriented x3, non-focal CV: s1s2 regular rate and rhythm, no murmur, rubs, or gallops,  PULM:  Clear to ascultation, no increased work of breathing, oxygen saturations 88-92 on 5L [place] GI: soft, bowel sounds active in all 4 quadrants, non-tender, non-distended Extremities: warm/dry, no edema  Skin: no rashes or lesions   Pertinent labs and imaging  7/24 CTA chest Bilateral geographic airspace opacities in the lungs with likely reactive or congested lymph nodes in the mediastinum. 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   -Not immunosuppressed.  Does have some pneumotoxic antiepileptic drugs but timing more c/w post COVID inflammatory  pneumonitis.   -Treatment for this disease entity is usually just trial of steroids, time, and rule out concomitant issues (volume overload, other viral/bacterial pneumonias).  Viral panel neg P: Follow BAL cultures  Head of bed elevated 30 degrees. Ensure adequate pulmonary hygiene  Mobilize as able  Continue to wean supplemental oxygen  Remains on steroids taper  Daily assessment of need for diuretics   Critical care time: NA  Siedah Sedor D. Kenton Kingfisher,  NP-C [facility] Personal contact information can be found on Amion  01/15/2021, 8:11 AM

## 2021-01-15 NOTE — Progress Notes (Signed)
HD#7 SUBJECTIVE:  Patient Summary: Randy Greene is a 55 y.o. with a pertinent PMH of MS, HTN, HLD, BPH, and asthma, who presented with shortness of breath and admitted for multifocal pneumonia.    Overnight Events: No acute events overnight  Interim History: Randy Greene was seen and evaluated at the bedside this morning. He states that he feels like his breathing is improved since being started on steroids, however, patient still requires 5L [place]. He is ready to be discharged home on oxygen with home health, however, supplemental O2 unable to be delivered today. Patient will be discharged tomorrow.   OBJECTIVE:  Vital Signs: Vitals:   01/15/21 1032 01/15/21 1126 01/15/21 1216 01/15/21 1323  BP: 113/77 106/82 (!) 84/64 104/69  Pulse: 84 93    Resp: 16 18  (!) 22  Temp: 98.1 F (36.7 C) 98.4 F (36.9 C)    TempSrc: Oral Oral    SpO2: 90% (!) 89%    Weight:      Height:       Supplemental O2: Nasal Cannula SpO2: (!) 89 % O2 Flow Rate (L/min): 5 L/min FiO2 (%): 40 %  Filed Weights   01/08/21 1435 01/09/21 1814 01/13/21 0419  Weight: 92.5 kg 93.9 kg 89.5 kg     Intake/Output Summary (Last 24 hours) at 01/15/2021 1423 Last data filed at 01/15/2021 0330 Gross per 24 hour  Intake --  Output 1850 ml  Net -1850 ml   Net IO Since Admission: 1,541.38 mL [01/15/21 1423]  Physical Exam: Physical Exam Constitutional:      General: He is not in acute distress.    Appearance: He is well-developed.  HENT:     Head: Normocephalic and atraumatic.  Eyes:     Pupils: Pupils are equal, round, and reactive to light.  Cardiovascular:     Rate and Rhythm: Normal rate and regular rhythm.     Pulses: Normal pulses.     Heart sounds: Normal heart sounds. No murmur heard. Pulmonary:     Effort: Pulmonary effort is normal. No respiratory distress.     Breath sounds: Normal breath sounds. No wheezing or rales.     Comments: Remains on 5L [place] Abdominal:     General: There is no distension.      Palpations: Abdomen is soft.     Tenderness: There is no abdominal tenderness.  Musculoskeletal:     Right lower leg: No edema.     Left lower leg: No edema.  Skin:    General: Skin is warm and dry.  Neurological:     General: No focal deficit present.     Mental Status: He is alert and oriented to person, place, and time. Mental status is at baseline.    Patient Lines/Drains/Airways Status     Active Line/Drains/Airways     Name Placement date Placement time Site Days   Airway 8.5 mm 01/14/21  0809  -- 1   Incision (Closed) 08/10/16 Knee Left 08/10/16  1317  -- 1619   Incision (Closed) 01/04/17 Knee Left 01/04/17  1523  -- 1472            Pertinent Labs: CBC Latest Ref Rng & Units 01/15/2021 01/14/2021 01/13/2021  WBC 4.0 - 10.5 K/uL 4.1 4.4 4.5  Hemoglobin 13.0 - 17.0 g/dL 11.5(L) 10.8(L) 11.1(L)  Hematocrit 39.0 - 52.0 % 34.7(L) 31.8(L) 32.8(L)  Platelets 150 - 400 K/uL 338 311 285    CMP Latest Ref Rng & Units 01/15/2021 01/14/2021 01/13/2021  Glucose 70 - 99 mg/dL 194(H) 98 107(H)  BUN 6 - 20 mg/dL _0 Creatinine 0.61 - 1.24 mg/dL 0.69 0.79 0.85  Sodium 135 - 145 mmol/L 132(L) 129(L) 133(L)  Potassium 3.5 - 5.1 mmol/L 4.0 3.4(L) 3.8  Chloride 98 - 111 mmol/L 97(L) 93(L) 96(L)  CO2 22 - 32 mmol/L _1 Calcium 8.9 - 10.3 mg/dL 8.9 8.4(L) 8.7(L)  Total Protein 6.5 - 8.1 g/dL - - 6.5  Total Bilirubin 0.3 - 1.2 mg/dL - - 0.6  Alkaline Phos 38 - 126 U/L - - 97  AST 15 - 41 U/L - - 107(H)  ALT 0 - 44 U/L - - 55(H)    No results for input(s): GLUCAP in the last 72 hours.   Pertinent Imaging: DG Chest Port 1 View  Result Date: 01/15/2021 CLINICAL DATA:  Shortness of breath, interstitial pneumonia EXAM: PORTABLE CHEST 1 VIEW COMPARISON:  01/13/2021 FINDINGS: Patchy bilateral interstitial and airspace opacities are again noted, not significantly changed since prior study. Heart is normal size. No effusions or pneumothorax. No acute bony abnormality.  IMPRESSION: No significant change in multifocal pneumonia. Electronically Signed   By: Rolm Baptise M.D.   On: 01/15/2021 09:17    ASSESSMENT/PLAN:  Assessment: Principal Problem:   Multifocal pneumonia  Plan: Randy Greene is a 55 y.o. with a pertinent PMH of hypertension, hyperlipidemia, asthma, OSA, trigeminal neuralgia, and MS c/b anterograde amnesia who presented with dyspnea in setting of post-COVID pneumonia and admitted for sepsis.   #Acute hypoxic respiratory failure  #Nonspecific interstitial pneumonia #Post-COVID multifocal pneumonia Patient was afebrile this morning, no leukocytosis. Respiratory panel, legionella and strep pneumoniae negative. Blood cultures remain negative to date. Continues to require supplemental O2 and currently on 5L [place]. Persistent rales on exam. In light of recent COVID infection and initial presentation with chest pain, there was concern for possible pulmonary embolism. CTA chest showing bilateral airspace opacities in the lungs likely 2/2 to cryptogenic organizing pneumonia vs acute pulm edema, no PE noted. Patient has also been following with pulmonology as outpatient for pulmonary nodules and dyspnea that improved with albuterol therapy. Bronch/BAL performed 7/26 showing normal appearing airways with no evidence of inflammation. RUL BAL results still pending.  - Pulmonology consulted, appreciate recommendations  - Received 2 doses IV lasix 40 mg 7/25  - Weight down ~3.5kg since admission  - Empiric steroids: solumedrol 60m q8h x3 doses followed by 679mdaily    - Will be discharged on 4072mrednisone x1 week   - Discontinued abx 7/25  - Follow up with pulm in 1 week  - Check pulse ox with ambulation - Wean O2 as tolerated  #Multiple sclerosis c/b anterograde amnesia Residual weakness on left side and paresthesias, stable.  - Continue home dronabinol 2.5mg19md baclofen 10mg45mday - Metoclopramide 10mg 86my, zofran 4mg q843mrn  #Diarrhea,  resolved Improved with imodium. No further episodes of diarrhea reported. - Imodium 2mg prn18mHypertension Currently normotensive. Patient wants to resume cardura. Will switch out alpha antagonist at this time. Patient had orthostatic hypotension when getting out of bed and standing today, SBP dropped to 60. Patient felt dizzy and had chest pain during this episode, pain resolved after laying back down. Patient given 500cc fluid bolus for hypotension.  - Cardura 4mg dail22m Diet: vegetarian IVF: None,None VTE: Enoxaparin Code: Full PT/OT recs: Home Health  Prior to Admission Living Arrangement: Home Anticipated Discharge Location: Home w/ HH Barriers to Discharge: Continued  medical management Dispo: Anticipated discharge to Home in 1 days pending home O2 delivery  Signature: Allye Hoyos, D.O.  Internal Medicine Resident, PGY-1 [facility]  Pager: [phone] 2:23 PM, 01/15/2021   Please contact the on call pager after 5 pm and on weekends at [phone].

## 2021-01-15 NOTE — TOC Progression Note (Addendum)
Transition of Care [facility]) - Progression Note    Patient Details  Name: Randy Greene MRN: [id number] Date of Birth: Nov 11, 1965  Transition of Care [facility] CM/SW Contact  Zenon Mayo, RN Phone Number: 01/15/2021, 2:09 PM  Clinical Narrative:    NCM faxed oxygen order and sats to PCP Dr. Nyoka Cowden and left message for CSW, [place] Ar at the [facility]  NCM received call back from [employer] with the [place], she received the fax and states they will deliver the oxygen to patient's home today so that his friend Santiago Glad can bring him a tank to the hospital to go home with tomorrow.   16:00Levada Dy from [place] called back stating MD wants to know how many oxygen liters is needed at rest to maintain his goal saturation.  NCM received information from Staff RN and faxed this to Elsa at the [place].   Expected Discharge Plan: [place] Barriers to Discharge: Continued Medical Work up  Expected Discharge Plan and Services Expected Discharge Plan: [place]   Discharge Planning Services: CM Consult Post Acute Care Choice: [place] arrangements for the past 2 months: [place]: PT [place]: [place] Date [place]: 01/12/21 Time [place]: 4098 Representative spoke with at [place]: [place] ([place]) Interventions    Readmission Risk Interventions No flowsheet data found.

## 2021-01-15 NOTE — Anesthesia Postprocedure Evaluation (Signed)
Anesthesia Post Note  Patient: Randy Greene  Procedure(s) Performed: VIDEO BRONCHOSCOPY WITHOUT FLUORO (Bilateral) BRONCHIAL WASHINGS     Patient location during evaluation: PACU Anesthesia Type: General Level of consciousness: awake and alert Pain management: pain level controlled Vital Signs Assessment: post-procedure vital signs reviewed and stable Respiratory status: spontaneous breathing, nonlabored ventilation, respiratory function stable and patient connected to nasal cannula oxygen Cardiovascular status: blood pressure returned to baseline and stable Postop Assessment: no apparent nausea or vomiting Anesthetic complications: no   No notable events documented.  Last Vitals:  Vitals:   01/15/21 1126 01/15/21 1216  BP: 106/82 (!) 84/64  Pulse: 93   Resp: 18   Temp: 36.9 C   SpO2: (!) 89%     Last Pain:  Vitals:   01/15/21 1126  TempSrc: Oral  PainSc:                  March Rummage Sorren Vallier

## 2021-01-15 NOTE — Progress Notes (Addendum)
  Patient Saturations on Room Air at Rest = 81%   Patient Saturations on Room Air while Ambulating = NA   Patient requires 5L of oxygen to maintain sats at 91% at rest. Needs 6L while ambulating to maintain sats at 91%.

## 2021-01-15 NOTE — Progress Notes (Signed)
Pt refused CPAP for tonight.  

## 2021-01-15 NOTE — Plan of Care (Signed)
  Problem: Education: Goal: Knowledge of General Education information will improve Description: Including pain rating scale, medication(s)/side effects and non-pharmacologic comfort measures Outcome: Progressing   Problem: Clinical Measurements: Goal: Ability to maintain clinical measurements within normal limits will improve Outcome: Progressing Goal: Will remain free from infection Outcome: Progressing Goal: Respiratory complications will improve Outcome: Progressing   Problem: Activity: Goal: Risk for activity intolerance will decrease Outcome: Progressing   Problem: Nutrition: Goal: Adequate nutrition will be maintained Outcome: Progressing

## 2021-01-15 NOTE — Progress Notes (Signed)
Pt without dizziness, BP WNL following IV fluids. HR 123 bpm with standing activities. Reinforced energy conservation, pacing, walker use until stronger and use of IS.    01/15/21 1600  OT Visit Information  Last OT Received On 01/15/21  Assistance Needed +1  History of Present Illness 55 yo admitted 7/20 with SOB and CP. Pt with Covid 3 weeks PTA then PNA. PMHx: MS, HTN, asthma, HLD, memory loss, self-reported ADHD, BPH, Lt TKA.  Precautions  Precautions Other (comment)  Precaution Comments watch BP and SpO2  Pain Assessment  Pain Assessment No/denies pain  Cognition  Arousal/Alertness Awake/alert  Behavior During Therapy WFL for tasks assessed/performed  Overall Cognitive Status Within Functional Limits for tasks assessed  ADL  Overall ADL's  Needs assistance/impaired  Lower Body Dressing Supervision/safety;Sit to/from Psychologist, clinical Details (indicate cue type and reason) stood to urinate  Toileting- Technical sales engineer;Sit to/from stand  General ADL Comments reinforced energy conservation and use of IS  Bed Mobility  Overal bed mobility Independent  Transfers  Overall transfer level Needs assistance  Transfers Sit to/from Stand  Sit to Stand Supervision  General transfer comment supervision for safety due to earlier low BP  OT - End of Session  Equipment Utilized During Treatment Oxygen  Activity Tolerance Patient tolerated treatment well  Patient left in bed;with call bell/phone within reach;with nursing/sitter in room  Nurse Communication Other (comment) (HR to 123)  OT Assessment/Plan  OT Plan Discharge plan remains appropriate  OT Visit Diagnosis Unsteadiness on feet (R26.81)  OT Frequency (ACUTE ONLY) Min 2X/week  Follow Up Recommendations Home health OT;Supervision - Intermittent  OT Equipment None recommended by OT  AM-PAC OT "6 Clicks" Daily Activity Outcome Measure (Version 2)  Help  from another person eating meals? 4  Help from another person taking care of personal grooming? 3  Help from another person toileting, which includes using toliet, bedpan, or urinal? 3  Help from another person bathing (including washing, rinsing, drying)? 4  Help from another person to put on and taking off regular upper body clothing? 3  6 Click Score 17  Progressive Mobility  What is the highest level of mobility based on the progressive mobility assessment? Level 3 (Stands with assist) - Balance while standing  and cannot march in place  Mobility Out of bed to chair with meals  OT Goal Progression  Progress towards OT goals Progressing toward goals  Acute Rehab OT Goals  Patient Stated Goal go home without oxygen  OT Goal Formulation With patient  Time For Goal Achievement 01/20/21  Potential to Achieve Goals Good  OT Time Calculation  OT Start Time (ACUTE ONLY) 1545  OT Stop Time (ACUTE ONLY) 1601  OT Time Calculation (min) 16 min  OT General Charges  $OT Visit 1 Visit  OT Treatments  $Self Care/Home Management  8-22 mins  Nestor Lewandowsky, OTR/L Acute Rehabilitation Services Pager: [phone] Office: [phone]

## 2021-01-16 ENCOUNTER — Encounter (HOSPITAL_COMMUNITY): Payer: Self-pay | Admitting: Internal Medicine

## 2021-01-16 DIAGNOSIS — J189 Pneumonia, unspecified organism: Secondary | ICD-10-CM | POA: Diagnosis not present

## 2021-01-16 LAB — CULTURE, RESPIRATORY W GRAM STAIN: Culture: NO GROWTH

## 2021-01-16 LAB — CBC
HCT: 33.9 % — ABNORMAL LOW (ref 39.0–52.0)
Hemoglobin: 11.4 g/dL — ABNORMAL LOW (ref 13.0–17.0)
MCH: 28.8 pg (ref 26.0–34.0)
MCHC: 33.6 g/dL (ref 30.0–36.0)
MCV: 85.6 fL (ref 80.0–100.0)
Platelets: 406 10*3/uL — ABNORMAL HIGH (ref 150–400)
RBC: 3.96 MIL/uL — ABNORMAL LOW (ref 4.22–5.81)
RDW: 13.5 % (ref 11.5–15.5)
WBC: 12.2 10*3/uL — ABNORMAL HIGH (ref 4.0–10.5)
nRBC: 0 % (ref 0.0–0.2)

## 2021-01-16 LAB — BASIC METABOLIC PANEL
Anion gap: 13 (ref 5–15)
BUN: 10 mg/dL (ref 6–20)
CO2: 25 mmol/L (ref 22–32)
Calcium: 8.6 mg/dL — ABNORMAL LOW (ref 8.9–10.3)
Chloride: 94 mmol/L — ABNORMAL LOW (ref 98–111)
Creatinine, Ser: 0.72 mg/dL (ref 0.61–1.24)
GFR, Estimated: >60 mL/min (ref >60)
Glucose, Bld: 90 mg/dL (ref 70–99)
Potassium: 3.8 mmol/L (ref 3.5–5.1)
Sodium: 132 mmol/L — ABNORMAL LOW (ref 135–145)

## 2021-01-16 LAB — MAGNESIUM: Magnesium: 2.3 mg/dL (ref 1.7–2.4)

## 2021-01-16 LAB — CYTOLOGY - NON PAP

## 2021-01-16 MED ORDER — LACTATED RINGERS IV BOLUS
500.0000 mL | Freq: Once | INTRAVENOUS | Status: AC
  Administered 2021-01-16: 500 mL via INTRAVENOUS

## 2021-01-16 MED ORDER — IBUPROFEN 200 MG PO TABS
400.0000 mg | ORAL_TABLET | Freq: Once | ORAL | Status: AC
  Administered 2021-01-16: 400 mg via ORAL
  Filled 2021-01-16: qty 2

## 2021-01-16 MED ORDER — LACTATED RINGERS IV SOLN: INTRAVENOUS | Status: DC

## 2021-01-16 NOTE — Progress Notes (Signed)
Physical Therapy Treatment Patient Details Name: Randy Greene MRN: [id number] DOB: [birth year] Today's Date: 01/16/2021    History of Present Illness 55 yo admitted 7/20 with SOB and CP. Pt with Covid 3 weeks PTA then PNA. PMHx: MS, HTN, asthma, HLD, memory loss, self-reported ADHD, BPH, Lt TKA.    PT Comments    Plan was to wrap up goals with mobility in the halls, confirm BP was holding well before pt's d/c today once portable O2 received.  However, every time pt stood into the 4WW for 1-2 min prepping to try ambulation, pt became significantly lightheaded needing to return to sitting with corresponding low BP's in the 89-90/ 64-66 range.  RN notified and is contacting MD for interventions before d/c final.    Follow Up Recommendations  Home health PT;Supervision for mobility/OOB     Equipment Recommendations  None recommended by PT    Recommendations for Other Services       Precautions / Restrictions Precautions Precautions:  (soft BP's) Precaution Comments: watch BP and SpO2    Mobility  Bed Mobility Overal bed mobility: Independent                  Transfers Overall transfer level: Needs assistance Equipment used: 4-wheeled walker Transfers: Sit to/from Stand Sit to Stand: Supervision         General transfer comment: supervision for safety due to continuing low BP's today.  Ambulation/Gait             General Gait Details: unable due to symptomatic low BP's   Stairs             Wheelchair Mobility    Modified Rankin (Stroke Patients Only)       Balance                                            Cognition Arousal/Alertness: Awake/alert Behavior During Therapy: WFL for tasks assessed/performed Overall Cognitive Status: Within Functional Limits for tasks assessed                                 General Comments: pt c/o increased brain fog after recent Covid infection (also states he has ADHD), good  following of 1 and 2-step commands, pt asking for call bell once in chair but was already placed in his lap.      Exercises      General Comments General comments (skin integrity, edema, etc.): BP sitting after lightheadedness on standing for 1-2 min.  1st trial 90/66, 2nd trial 89/64.  This is 3+ min after sitting due to unable to get readings sooner after multiple tries.  Corresponding sats on 4L at 88-90%      Pertinent Vitals/Pain Pain Assessment: No/denies pain    Home Living                      Prior Function            PT Goals (current goals can now be found in the care plan section) Acute Rehab PT Goals PT Goal Formulation: With patient Time For Goal Achievement: 01/26/21 Potential to Achieve Goals: Fair Progress towards PT goals: Progressing toward goals (limited by soft BP's)    Frequency    Min 3X/week      PT Plan  Current plan remains appropriate    Co-evaluation              AM-PAC PT "6 Clicks" Mobility   Outcome Measure  Help needed turning from your back to your side while in a flat bed without using bedrails?: None Help needed moving from lying on your back to sitting on the side of a flat bed without using bedrails?: None Help needed moving to and from a bed to a chair (including a wheelchair)?: A Little Help needed standing up from a chair using your arms (e.g., wheelchair or bedside chair)?: A Little Help needed to walk in hospital room?: A Little Help needed climbing 3-5 steps with a railing? : A Lot 6 Click Score: 19    End of Session   Activity Tolerance: Treatment limited secondary to medical complications (Comment) Patient left: in bed;with call bell/phone within reach Nurse Communication: Mobility status PT Visit Diagnosis: Difficulty in walking, not elsewhere classified (R26.2)     Time: 7782-4235 PT Time Calculation (min) (ACUTE ONLY): 32 min  Charges:  $Therapeutic Activity: 23-37 mins                      01/16/2021  Ginger Carne., PT Acute Rehabilitation Services [phone]  (pager) [phone]  (office)   Randy Greene 01/16/2021, 2:42 PM

## 2021-01-16 NOTE — TOC Transition Note (Signed)
Transition of Care [facility]) - CM/SW Discharge Note   Patient Details  Name: Kamsiyochukwu Spickler MRN: [id number] Date of Birth: [birth year]  [facility] CM/SW Contact:  Zenon Mayo, RN Phone Number: 01/16/2021, 4:47 PM   Clinical Narrative:    Patient is for dc today, NCM notified Cory with [facility]. [facility] states [facility] will [facility] today, she could not give me a eta. NCM notified MD to put dc order in so when oxygen gets to his home , his friend can bring the tank to transport him home.   Final next level of care: [place] Barriers to Discharge: Equipment Delay   Patient Goals and CMS Choice Patient states their goals for this hospitalization and ongoing recovery are:: return home CMS Medicare.gov Compare Post Acute Care list provided to:: Patient Choice offered to / list presented to : Patient  Discharge Placement                       Discharge Plan and Services   Discharge Planning Services: CM Consult Post Acute Care Choice: Home Health          DME Arranged: Oxygen DME Agency:  ([facility]) Date DME Agency Contacted: 01/15/21 Time DME Agency Contacted: 1000 Representative spoke with at DME Agency: [facility] HH Arranged: PT [place]: [place] Date [place]: 01/12/21 Time [place]: [place] Representative spoke with at [place]: [place] ([place]) Interventions     Readmission Risk Interventions No flowsheet data found.

## 2021-01-22 ENCOUNTER — Other Ambulatory Visit: Payer: Self-pay

## 2021-01-22 ENCOUNTER — Inpatient Hospital Stay (HOSPITAL_COMMUNITY)
Admission: EM | Admit: 2021-01-22 | Discharge: 2021-01-27 | DRG: 189 | Disposition: A | Discharge status: Home Health | Payer: No Typology Code available for payment source | Attending: Student in an Organized Health Care Education/Training Program | Admitting: Student in an Organized Health Care Education/Training Program

## 2021-01-22 ENCOUNTER — Encounter (HOSPITAL_COMMUNITY): Payer: Self-pay | Admitting: Emergency Medicine

## 2021-01-22 ENCOUNTER — Emergency Department (HOSPITAL_COMMUNITY): Payer: No Typology Code available for payment source

## 2021-01-22 DIAGNOSIS — Z880 Allergy status to penicillin: Secondary | ICD-10-CM

## 2021-01-22 DIAGNOSIS — Z9981 Dependence on supplemental oxygen: Secondary | ICD-10-CM

## 2021-01-22 DIAGNOSIS — Z7951 Long term (current) use of inhaled steroids: Secondary | ICD-10-CM

## 2021-01-22 DIAGNOSIS — R631 Polydipsia: Secondary | ICD-10-CM | POA: Diagnosis present

## 2021-01-22 DIAGNOSIS — Z888 Allergy status to other drugs, medicaments and biological substances status: Secondary | ICD-10-CM

## 2021-01-22 DIAGNOSIS — J9690 Respiratory failure, unspecified, unspecified whether with hypoxia or hypercapnia: Secondary | ICD-10-CM | POA: Diagnosis present

## 2021-01-22 DIAGNOSIS — J189 Pneumonia, unspecified organism: Secondary | ICD-10-CM | POA: Diagnosis present

## 2021-01-22 DIAGNOSIS — Z8249 Family history of ischemic heart disease and other diseases of the circulatory system: Secondary | ICD-10-CM

## 2021-01-22 DIAGNOSIS — I10 Essential (primary) hypertension: Secondary | ICD-10-CM | POA: Diagnosis present

## 2021-01-22 DIAGNOSIS — C3490 Malignant neoplasm of unspecified part of unspecified bronchus or lung: Secondary | ICD-10-CM | POA: Diagnosis present

## 2021-01-22 DIAGNOSIS — E871 Hypo-osmolality and hyponatremia: Secondary | ICD-10-CM | POA: Diagnosis present

## 2021-01-22 DIAGNOSIS — J9621 Acute and chronic respiratory failure with hypoxia: Secondary | ICD-10-CM | POA: Diagnosis not present

## 2021-01-22 DIAGNOSIS — Z818 Family history of other mental and behavioral disorders: Secondary | ICD-10-CM

## 2021-01-22 DIAGNOSIS — H469 Unspecified optic neuritis: Secondary | ICD-10-CM | POA: Diagnosis present

## 2021-01-22 DIAGNOSIS — R945 Abnormal results of liver function studies: Secondary | ICD-10-CM

## 2021-01-22 DIAGNOSIS — Z96652 Presence of left artificial knee joint: Secondary | ICD-10-CM | POA: Diagnosis present

## 2021-01-22 DIAGNOSIS — Z9109 Other allergy status, other than to drugs and biological substances: Secondary | ICD-10-CM

## 2021-01-22 DIAGNOSIS — Z20822 Contact with and (suspected) exposure to covid-19: Secondary | ICD-10-CM | POA: Diagnosis present

## 2021-01-22 DIAGNOSIS — J8489 Other specified interstitial pulmonary diseases: Secondary | ICD-10-CM | POA: Diagnosis present

## 2021-01-22 DIAGNOSIS — R0602 Shortness of breath: Secondary | ICD-10-CM

## 2021-01-22 DIAGNOSIS — K219 Gastro-esophageal reflux disease without esophagitis: Secondary | ICD-10-CM | POA: Diagnosis present

## 2021-01-22 DIAGNOSIS — N400 Enlarged prostate without lower urinary tract symptoms: Secondary | ICD-10-CM | POA: Diagnosis present

## 2021-01-22 DIAGNOSIS — G4733 Obstructive sleep apnea (adult) (pediatric): Secondary | ICD-10-CM | POA: Diagnosis present

## 2021-01-22 DIAGNOSIS — E785 Hyperlipidemia, unspecified: Secondary | ICD-10-CM | POA: Diagnosis present

## 2021-01-22 DIAGNOSIS — R000 Tachycardia, unspecified: Secondary | ICD-10-CM | POA: Diagnosis present

## 2021-01-22 DIAGNOSIS — G500 Trigeminal neuralgia: Secondary | ICD-10-CM | POA: Diagnosis present

## 2021-01-22 DIAGNOSIS — Z79899 Other long term (current) drug therapy: Secondary | ICD-10-CM

## 2021-01-22 DIAGNOSIS — R64 Cachexia: Secondary | ICD-10-CM | POA: Diagnosis present

## 2021-01-22 DIAGNOSIS — J690 Pneumonitis due to inhalation of food and vomit: Secondary | ICD-10-CM | POA: Diagnosis present

## 2021-01-22 DIAGNOSIS — J84116 Cryptogenic organizing pneumonia: Secondary | ICD-10-CM | POA: Diagnosis present

## 2021-01-22 DIAGNOSIS — G35 Multiple sclerosis: Secondary | ICD-10-CM | POA: Diagnosis present

## 2021-01-22 DIAGNOSIS — Z885 Allergy status to narcotic agent status: Secondary | ICD-10-CM

## 2021-01-22 DIAGNOSIS — U099 Post covid-19 condition, unspecified: Secondary | ICD-10-CM | POA: Diagnosis present

## 2021-01-22 DIAGNOSIS — J45909 Unspecified asthma, uncomplicated: Secondary | ICD-10-CM | POA: Diagnosis present

## 2021-01-22 DIAGNOSIS — R7401 Elevation of levels of liver transaminase levels: Secondary | ICD-10-CM | POA: Diagnosis present

## 2021-01-22 DIAGNOSIS — Z6824 Body mass index (BMI) 24.0-24.9, adult: Secondary | ICD-10-CM

## 2021-01-22 LAB — URINALYSIS, ROUTINE W REFLEX MICROSCOPIC
Bilirubin Urine: NEGATIVE
Glucose, UA: NEGATIVE mg/dL
Hgb urine dipstick: NEGATIVE
Ketones, ur: NEGATIVE mg/dL
Leukocytes,Ua: NEGATIVE
Nitrite: NEGATIVE
Protein, ur: NEGATIVE mg/dL
Specific Gravity, Urine: 1.009 (ref 1.005–1.030)
pH: 6 (ref 5.0–8.0)

## 2021-01-22 LAB — LACTIC ACID, PLASMA: Lactic Acid, Venous: 1.7 mmol/L (ref 0.5–1.9)

## 2021-01-22 LAB — CBC
HCT: 32.8 % — ABNORMAL LOW (ref 39.0–52.0)
Hemoglobin: 10.5 g/dL — ABNORMAL LOW (ref 13.0–17.0)
MCH: 27.9 pg (ref 26.0–34.0)
MCHC: 32 g/dL (ref 30.0–36.0)
MCV: 87.2 fL (ref 80.0–100.0)
Platelets: 367 10*3/uL (ref 150–400)
RBC: 3.76 MIL/uL — ABNORMAL LOW (ref 4.22–5.81)
RDW: 14.5 % (ref 11.5–15.5)
WBC: 8.5 10*3/uL (ref 4.0–10.5)
nRBC: 0 % (ref 0.0–0.2)

## 2021-01-22 LAB — COMPREHENSIVE METABOLIC PANEL
ALT: 158 U/L — ABNORMAL HIGH (ref 0–44)
AST: 93 U/L — ABNORMAL HIGH (ref 15–41)
Albumin: 2.2 g/dL — ABNORMAL LOW (ref 3.5–5.0)
Alkaline Phosphatase: 170 U/L — ABNORMAL HIGH (ref 38–126)
Anion gap: 12 (ref 5–15)
BUN: 10 mg/dL (ref 6–20)
CO2: 20 mmol/L — ABNORMAL LOW (ref 22–32)
Calcium: 8.3 mg/dL — ABNORMAL LOW (ref 8.9–10.3)
Chloride: 99 mmol/L (ref 98–111)
Creatinine, Ser: 0.85 mg/dL (ref 0.61–1.24)
GFR, Estimated: >60 mL/min (ref >60)
Glucose, Bld: 119 mg/dL — ABNORMAL HIGH (ref 70–99)
Potassium: 4.2 mmol/L (ref 3.5–5.1)
Sodium: 131 mmol/L — ABNORMAL LOW (ref 135–145)
Total Bilirubin: 0.6 mg/dL (ref 0.3–1.2)
Total Protein: 6.1 g/dL — ABNORMAL LOW (ref 6.5–8.1)

## 2021-01-22 LAB — D-DIMER, QUANTITATIVE: D-Dimer, Quant: 1.66 ug/mL-FEU — ABNORMAL HIGH (ref 0.00–0.50)

## 2021-01-22 LAB — RESP PANEL BY RT-PCR (FLU A&B, COVID) ARPGX2
Influenza A by PCR: NEGATIVE
Influenza B by PCR: NEGATIVE
SARS Coronavirus 2 by RT PCR: NEGATIVE

## 2021-01-22 LAB — BRAIN NATRIURETIC PEPTIDE: B Natriuretic Peptide: 15.3 pg/mL (ref 0.0–100.0)

## 2021-01-22 MED ORDER — SODIUM CHLORIDE 0.9 % IV SOLN
2.0000 g | Freq: Once | INTRAVENOUS | Status: AC
  Administered 2021-01-22: 2 g via INTRAVENOUS
  Filled 2021-01-22: qty 2

## 2021-01-22 MED ORDER — FLUTICASONE FUROATE-VILANTEROL 200-25 MCG/INH IN AEPB
1.0000 | INHALATION_SPRAY | Freq: Every day | RESPIRATORY_TRACT | Status: DC
  Administered 2021-01-22 – 2021-01-27 (×6): 1 via RESPIRATORY_TRACT
  Filled 2021-01-22: qty 28

## 2021-01-22 MED ORDER — PANTOPRAZOLE SODIUM 40 MG PO TBEC
40.0000 mg | DELAYED_RELEASE_TABLET | Freq: Every day | ORAL | Status: DC
  Administered 2021-01-23 – 2021-01-24 (×2): 40 mg via ORAL
  Filled 2021-01-22 (×3): qty 1

## 2021-01-22 MED ORDER — FINASTERIDE 5 MG PO TABS
5.0000 mg | ORAL_TABLET | Freq: Every day | ORAL | Status: DC
  Administered 2021-01-23 – 2021-01-27 (×5): 5 mg via ORAL
  Filled 2021-01-22 (×5): qty 1

## 2021-01-22 MED ORDER — FAMOTIDINE 20 MG PO TABS: 40.0000 mg | ORAL_TABLET | Freq: Every day | ORAL | Status: DC

## 2021-01-22 MED ORDER — FAMOTIDINE 20 MG PO TABS
20.0000 mg | ORAL_TABLET | Freq: Every day | ORAL | Status: DC
  Administered 2021-01-22 – 2021-01-26 (×5): 20 mg via ORAL
  Filled 2021-01-22 (×5): qty 1

## 2021-01-22 MED ORDER — ALFUZOSIN HCL ER 10 MG PO TB24
10.0000 mg | ORAL_TABLET | Freq: Every day | ORAL | Status: DC
  Administered 2021-01-24 – 2021-01-27 (×4): 10 mg via ORAL
  Filled 2021-01-22 (×5): qty 1

## 2021-01-22 MED ORDER — PREGABALIN 100 MG PO CAPS
300.0000 mg | ORAL_CAPSULE | Freq: Every day | ORAL | Status: DC
  Administered 2021-01-22 – 2021-01-26 (×5): 300 mg via ORAL
  Filled 2021-01-22 (×5): qty 3

## 2021-01-22 MED ORDER — DRONABINOL 2.5 MG PO CAPS
7.5000 mg | ORAL_CAPSULE | Freq: Three times a day (TID) | ORAL | Status: DC
  Administered 2021-01-22 – 2021-01-27 (×12): 7.5 mg via ORAL
  Filled 2021-01-22 (×8): qty 3
  Filled 2021-01-22: qty 1
  Filled 2021-01-22 (×3): qty 3

## 2021-01-22 MED ORDER — ASPIRIN EC 81 MG PO TBEC
81.0000 mg | DELAYED_RELEASE_TABLET | Freq: Every day | ORAL | Status: DC
  Administered 2021-01-22 – 2021-01-26 (×5): 81 mg via ORAL
  Filled 2021-01-22 (×5): qty 1

## 2021-01-22 MED ORDER — SIMVASTATIN 20 MG PO TABS
10.0000 mg | ORAL_TABLET | Freq: Every day | ORAL | Status: DC
  Administered 2021-01-22 – 2021-01-26 (×5): 10 mg via ORAL
  Filled 2021-01-22 (×6): qty 1

## 2021-01-22 MED ORDER — METHYLPREDNISOLONE SODIUM SUCC 125 MG IJ SOLR
80.0000 mg | Freq: Once | INTRAMUSCULAR | Status: AC
  Administered 2021-01-22: 80 mg via INTRAVENOUS
  Filled 2021-01-22: qty 2

## 2021-01-22 MED ORDER — PREGABALIN 75 MG PO CAPS
150.0000 mg | ORAL_CAPSULE | Freq: Two times a day (BID) | ORAL | Status: DC
  Administered 2021-01-23 – 2021-01-27 (×8): 150 mg via ORAL
  Filled 2021-01-22 (×8): qty 2
  Filled 2021-01-22: qty 1

## 2021-01-22 MED ORDER — ALBUTEROL SULFATE (2.5 MG/3ML) 0.083% IN NEBU
2.5000 mg | INHALATION_SOLUTION | Freq: Four times a day (QID) | RESPIRATORY_TRACT | Status: DC
  Administered 2021-01-22 – 2021-01-26 (×14): 2.5 mg via RESPIRATORY_TRACT
  Filled 2021-01-22 (×13): qty 3

## 2021-01-22 MED ORDER — ENOXAPARIN SODIUM 40 MG/0.4ML IJ SOSY
40.0000 mg | PREFILLED_SYRINGE | Freq: Every day | INTRAMUSCULAR | Status: DC
  Administered 2021-01-22 – 2021-01-26 (×5): 40 mg via SUBCUTANEOUS
  Filled 2021-01-22 (×5): qty 0.4

## 2021-01-22 MED ORDER — SODIUM CHLORIDE 0.9 % IV BOLUS
1000.0000 mL | Freq: Once | INTRAVENOUS | Status: AC
  Administered 2021-01-22: 1000 mL via INTRAVENOUS

## 2021-01-22 MED ORDER — METOCLOPRAMIDE HCL 5 MG PO TABS
10.0000 mg | ORAL_TABLET | Freq: Every day | ORAL | Status: DC
  Administered 2021-01-22 – 2021-01-26 (×5): 10 mg via ORAL
  Filled 2021-01-22 (×4): qty 2
  Filled 2021-01-22: qty 1

## 2021-01-22 MED ORDER — MONTELUKAST SODIUM 10 MG PO TABS
10.0000 mg | ORAL_TABLET | Freq: Every day | ORAL | Status: DC
  Administered 2021-01-23 – 2021-01-27 (×5): 10 mg via ORAL
  Filled 2021-01-22 (×5): qty 1

## 2021-01-22 MED ORDER — VANCOMYCIN HCL 2000 MG/400ML IV SOLN
2000.0000 mg | Freq: Once | INTRAVENOUS | Status: AC
  Administered 2021-01-22: 2000 mg via INTRAVENOUS
  Filled 2021-01-22: qty 400

## 2021-01-22 MED ORDER — BACLOFEN 10 MG PO TABS
15.0000 mg | ORAL_TABLET | Freq: Four times a day (QID) | ORAL | Status: DC
  Administered 2021-01-22 – 2021-01-24 (×7): 15 mg via ORAL
  Filled 2021-01-22: qty 2
  Filled 2021-01-22: qty 1
  Filled 2021-01-22 (×2): qty 2
  Filled 2021-01-22 (×2): qty 1
  Filled 2021-01-22: qty 2

## 2021-01-22 MED ORDER — PHENYTOIN SODIUM EXTENDED 100 MG PO CAPS
100.0000 mg | ORAL_CAPSULE | Freq: Two times a day (BID) | ORAL | Status: DC
  Administered 2021-01-22 – 2021-01-27 (×9): 100 mg via ORAL
  Filled 2021-01-22 (×11): qty 1

## 2021-01-22 MED ORDER — VANCOMYCIN HCL 1750 MG/350ML IV SOLN
1750.0000 mg | Freq: Two times a day (BID) | INTRAVENOUS | Status: DC
  Filled 2021-01-22: qty 350

## 2021-01-22 MED ORDER — DICLOFENAC SODIUM 1 % EX GEL: 2.0000 g | Freq: Two times a day (BID) | CUTANEOUS | Status: DC | PRN

## 2021-01-22 MED ORDER — ALBUTEROL SULFATE HFA 108 (90 BASE) MCG/ACT IN AERS
4.0000 | INHALATION_SPRAY | Freq: Once | RESPIRATORY_TRACT | Status: AC
  Administered 2021-01-22: 4 via RESPIRATORY_TRACT
  Filled 2021-01-22: qty 6.7

## 2021-01-22 NOTE — ED Notes (Signed)
Pt SpO2 continually drops to 88% while on 6L. Pt placed on 10L via [place].

## 2021-01-22 NOTE — ED Triage Notes (Signed)
Pt BIB [facility] c/o SOB. Pt endorses increasing SOB/weakness x2 days. Pt hospitalized with COVID in June, recently discharged for sepsis and pna. Sent home on 5-6L O2 via [place]. Pt went to the [place] today and was hypotensive and tachycardic, told to come to ED. Upon EMS arrival, pt was 86% on 6L, placed on 10L via [facility], SpO2 96% on 10L. Pt given 541mL NS.

## 2021-01-22 NOTE — ED Notes (Signed)
Pt Spo2 consistently 88% on 6L via [place]. Pt placed on 15L via [facility]. Spo2 98%.

## 2021-01-22 NOTE — ED Provider Notes (Signed)
Blood pressure 98/76, pulse 94, temperature 98.9 F (37.2 C), temperature source Oral, resp. rate 16, height 6\' 2"  (1.88 m), weight 88 kg, SpO2 97 %.  Assuming care from Dr. Ashok Cordia.  In short, Shaquan Philley is a 55 y.o. male with a chief complaint of Shortness of Breath .  Refer to the original H&P for additional details.  The current plan of care is to follow up on labs. CXR with worsening infiltrates and patient clinically worsening. Abx ordered. Plan for admit pending labs.  Discussed patient's case with [facility] to request admission. Patient and family (if present) updated with plan. Care transferred to [facility] service.  I reviewed all nursing notes, vitals, pertinent old records, EKGs, labs, imaging (as available).    Margette Fast, MD 01/23/21 1048

## 2021-01-22 NOTE — ED Notes (Signed)
Did ekg shown to er provider patient is resting with call bell in reach

## 2021-01-22 NOTE — ED Provider Notes (Signed)
[facility] EMERGENCY DEPARTMENT Provider Note   CSN: [id number] Arrival date & time: 01/22/21  1422     History Chief Complaint  Patient presents with   Shortness of Breath    Randy Greene is a 55 y.o. male.  Patient w hx MS, indicates was in baseline state of health until June 2022, when dx with COVID - states symptoms improved, almost resolved, but then was diagnosed with 'pneumonia'. States 7/20-7/28 was admitted to hospital with multifocal pna/sepsis, and eventually discharged on 5 liters o2 [place]. States since discharge, return of chills/sweats, and persistent/slowly worsening sob, increasing o2 to 6 liters, and w persistent tachycardia. Denies any new/acute chest pain or discomfort. No abrupt or acute worsening of sob, but rather persistence and slow progression of symptoms. Is on prednisone. Not currently on antibiotic therapy. Denies leg pain or swelling.  Pt formerly a 25 year smoker, denies specific hx copd or asthma - states with prior occasional pna dx was on o2 transiently, but no chronic home o2 therapy until recent d/c. Uses albuterol prn. Denies leg pain or swelling, no hx dvt or pe, and recent CT neg for PE. Denies orthopnea/pnd.  Was at his doctor at [facility] and was told to come to ED here.   The history is provided by the patient, medical records and the EMS personnel.  Shortness of Breath Associated symptoms: diaphoresis   Associated symptoms: no abdominal pain, no chest pain, no cough, no fever, no headaches, no neck pain, no rash, no sore throat and no vomiting  2     Past Medical History:  Diagnosis Date   Acid reflux    Arthritis    Aspiration pneumonia (HCC)    Asthma    Delayed wound healing    left knee   Dyspnea    Enlarged prostate    Hypercapnic respiratory failure, chronic ([place])    Hypertension    MS (multiple sclerosis) ([place])     Patient Active Problem List   Diagnosis Date Noted   Multifocal pneumonia 01/08/2021   Dyspnea 10/22/2020    Abnormal CT of the chest 10/22/2020   Degenerative arthritis of left knee 08/11/2016   Osteoarthritis of left knee 08/10/2016   Agitation 03/01/2014   Hypoxia 01/01/2014   Chronic hypercapnic respiratory failure ([place]) 97/35/3299   Metabolic alkalosis with chronic respiratory acidosis 01/01/2014   OSA on CPAP 01/01/2014   Acid reflux 07/17/2013   Sepsis ([place]) 04/11/2013   Hypokalemia 11/30/2012   Transaminitis 11/29/2012   Acute encephalopathy 11/29/2012   MS (multiple sclerosis) ([place]) 11/29/2012   Recurrent aspiration bronchitis/pneumonia 11/30/2011    Past Surgical History:  Procedure Laterality Date   ARTHROSCOPIC     BRONCHIAL WASHINGS  01/14/2021   Procedure: BRONCHIAL WASHINGS;  Surgeon: Candee Furbish, MD;  Location: [facility] ENDOSCOPY;  Service: Pulmonary;;   HERNIA REPAIR     INCISION AND DRAINAGE Left 01/04/2017   Procedure: INCISION AND DRAINAGE LEFT KNEE;  Surgeon: Rod Can, MD;  Location: [facility];  Service: Orthopedics;  Laterality: Left;   jawsurgery     KNEE ARTHROPLASTY Left 08/10/2016   Procedure: LEFT TOTAL KNEE ARTHROPLASTY WITH COMPUTER NAVIGATION;  Surgeon: Rod Can, MD;  Location: [facility];  Service: Orthopedics;  Laterality: Left;   knees scope     VIDEO BRONCHOSCOPY Bilateral 01/14/2021   Procedure: VIDEO BRONCHOSCOPY WITHOUT FLUORO;  Surgeon: Candee Furbish, MD;  Location: [facility] ENDOSCOPY;  Service: Pulmonary;  Laterality: Bilateral;       Family History  Problem Relation Age of Onset   Dementia Mother    Hypertension Father    Heart attack Father     Social History   Tobacco Use   Smoking status: Former    Packs/day: 1.00    Years: 20.00    Pack years: 20.00    Types: Cigarettes    Quit date: 11/02/2006    Years since quitting: 14.2   Smokeless tobacco: Never  Vaping Use   Vaping Use: Never used  Substance Use Topics   Alcohol use: Yes   Drug use: No    Comment: medical marijuana    Home Medications Prior to Admission medications    Medication Sig Start Date End Date Taking? Authorizing Provider  albuterol (PROVENTIL HFA;VENTOLIN HFA) 108 (90 BASE) MCG/ACT inhaler Inhale 2 puffs into the lungs every 6 (six) hours as needed for wheezing or shortness of breath.    [provider]  alfuzosin (UROXATRAL) 10 MG 24 hr tablet Take 10 mg by mouth daily with breakfast.    [provider]  amphetamine-dextroamphetamine (ADDERALL) 30 MG tablet Take 30 mg by mouth 2 (two) times daily.    [provider]  aspirin EC 81 MG tablet Take 81 mg by mouth at bedtime. Swallow whole.    [provider]  baclofen (LIORESAL) 10 MG tablet Take 15 mg by mouth 4 (four) times daily.     [provider]  Calcium Carbonate 500 (200 Ca) MG WAFR Take 1 tablet by mouth in the morning and at bedtime.    [provider]  cetirizine (ZYRTEC) 10 MG tablet Take 10 mg by mouth daily.    [provider]  Cholecalciferol (VITAMIN D) 50 MCG (2000 UT) tablet Take 2,000 Units by mouth daily.    [provider]  cyanocobalamin (,VITAMIN B-12,) 1000 MCG/ML injection Inject 1,000 mcg into the muscle once.    [provider]  diclofenac Sodium (VOLTAREN) 1 % GEL Apply 2 g topically 2 (two) times daily as needed (pain). 11/27/19   [provider]  dronabinol (MARINOL) 2.5 MG capsule Take 7.5 mg by mouth 3 (three) times daily.     [provider]  esomeprazole (NEXIUM) 40 MG capsule Take 40 mg by mouth 2 (two) times daily before a meal.    [provider]  famotidine (PEPCID) 40 MG tablet Take 40 mg by mouth at bedtime.    [provider]  finasteride (PROSCAR) 5 MG tablet Take 5 mg by mouth daily.    [provider]  fluorouracil (EFUDEX) 5 % cream Apply 1 application topically 2 (two) times daily.    [provider]  fluticasone-salmeterol (ADVAIR) 250-50 MCG/ACT AEPB Inhale 1 puff into the lungs in the morning and at bedtime.    [provider]  Folic Acid-Vit H8-EXH B71 (FOLBEE) 2.5-25-1 MG TABS tablet Take 1 tablet by mouth daily.    [provider]  ipratropium (ATROVENT) 0.03 % nasal spray Place 2 sprays into both nostrils 2 (two) times daily. 11/13/19   [provider]  ketotifen (ZADITOR) 0.025 % ophthalmic solution Place 1 drop into both eyes 2 (two) times daily as needed (itching).    [provider]  magnesium (MAGTAB) 84 MG (7MEQ) TBCR SR tablet Take 42 mg by mouth 2 (two) times daily.    [provider]  metoCLOPramide (REGLAN) 10 MG tablet Take 10 mg by mouth daily with supper.    [provider]  montelukast (SINGULAIR) 10 MG  tablet Take 10 mg by mouth daily.    [provider]  naloxone [facility]) nasal spray 4 mg/0.1 mL Place 1 spray into the nose See admin instructions. SPRAY 1 SPRAY INTO ONE NOSTRIL AS DIRECTED FOR OPIOID OVERDOSE (TURN PERSON ON SIDE AFTER DOSE. IF NO RESPONSE IN 2-3 MINUTES OR PERSON RESPONDS BUT RELAPSES, REPEAT USING A NEW SPRAY DEVICE AND SPRAY INTO THE OTHER NOSTRIL. CALL 911 AFTER USE.) * EMERGENCY USE ONLY * 01/16/20   [provider]  Omega-3 Fatty Acids (FISH OIL PO) Take 1 capsule by mouth daily.    [provider]  ondansetron (ZOFRAN) 4 MG tablet Take 4 mg by mouth every 8 (eight) hours as needed for nausea or vomiting.    [provider]  phenytoin (DILANTIN) 100 MG ER capsule Take 100 mg by mouth 2 (two) times daily. 07/18/20   [provider]  predniSONE (DELTASONE) 10 MG tablet Take 4 tablets (40 mg total) by mouth daily for 7 days. 01/15/21 01/22/21  Atway, Rayann N, DO  pregabalin (LYRICA) 150 MG capsule Take 150 mg by mouth See admin instructions. 162m am, 1566mnoon, and 30084mhs.    [provider]  Probiotic Product (ALIGN) 4 MG CAPS Take 4 mg by mouth in the morning and at bedtime.    [provider]  simvastatin (ZOCOR) 10 MG tablet Take 10 mg by mouth at bedtime.     [provider]  traMADol (ULTRAM) 50 MG tablet Take 50 mg by mouth every 6 (six) hours as needed for moderate pain or severe pain.    [provider]  vitamin C (ASCORBIC ACID) 500 MG tablet Take 500 mg by mouth daily.    [provider]  vitamin E 180 MG (400 UNITS) capsule Take 400 Units by mouth daily.    [provider]  Zinc 50 MG TABS Take 50 mg by mouth every other day.    [provider]    Allergies    Penicillins, Tape, Zoloft [sertraline], Food, Morphine, Mushroom extract complex, Morphine and related, and Penicillin g  Review of Systems   Review of Systems  Constitutional:  Positive for chills and diaphoresis. Negative for fever.  HENT:  Negative for sore throat.   Eyes:  Negative for redness.  Respiratory:  Positive for shortness of breath. Negative for cough.   Cardiovascular:  Negative for chest pain and leg swelling.  Gastrointestinal:  Negative for abdominal pain, diarrhea and vomiting.  Genitourinary:  Negative for dysuria and flank pain.  Musculoskeletal:  Negative for back pain, neck pain and neck stiffness.  Skin:  Negative for rash.  Neurological:  Negative for headaches.  Hematological:  Does not bruise/bleed easily.  Psychiatric/Behavioral:  Negative for confusion.    Physical Exam Updated Vital Signs BP 107/74   Pulse 99   Temp 98.9 F (37.2 C) (Oral)   Resp 13   Ht 1.88 m (_0 )   Wt 88 kg   SpO2 94%   BMI 24.91 kg/m   Physical Exam Vitals and nursing note reviewed.  Constitutional:      Appearance: Normal appearance. He is well-developed.  HENT:     Head: Atraumatic.     Nose: Nose normal.     Mouth/Throat:     Mouth: Mucous membranes are moist.     Pharynx: Oropharynx is clear.  Eyes:     General: No scleral icterus.    Conjunctiva/sclera: Conjunctivae normal.  Neck:     Trachea:  No tracheal deviation.     Comments: Trachea midline. Thyroid not grossly enlarged or tender. No neck mass. No  stiffness or rigidity. Cardiovascular:     Rate and Rhythm: Regular rhythm. Tachycardia present.     Pulses: Normal pulses.     Heart sounds: Normal heart sounds. No murmur heard.   No friction rub. No gallop.  Pulmonary:     Effort: Pulmonary effort is normal. No accessory muscle usage or respiratory distress.     Breath sounds: Normal breath sounds.     Comments: Mild wheeze. Rhonchi bases.  Abdominal:     General: Bowel sounds are normal. There is no distension.     Palpations: Abdomen is soft.     Tenderness: There is no abdominal tenderness.  Genitourinary:    Comments: No cva tenderness. Musculoskeletal:        General: No swelling or tenderness.     Cervical back: Normal range of motion and neck supple. No rigidity or tenderness.     Right lower leg: No edema.     Left lower leg: No edema.  Lymphadenopathy:     Cervical: No cervical adenopathy.  Skin:    General: Skin is warm and dry.     Findings: No rash.  Neurological:     Mental Status: He is alert.     Comments: Alert, speech clear.   Psychiatric:        Mood and Affect: Mood normal.    ED Results / Procedures / Treatments   Labs (all labs ordered are listed, but only abnormal results are displayed) Results for orders placed or performed during the hospital encounter of 01/08/21  Blood Culture (routine x 2)   Specimen: BLOOD  Result Value Ref Range   Specimen Description BLOOD SITE NOT SPECIFIED    Special Requests      BOTTLES DRAWN AEROBIC AND ANAEROBIC Blood Culture adequate volume   Culture      NO GROWTH 5 DAYS Performed at [facility], [place] [address], [place], [place] [address]    Report Status 01/13/2021 FINAL   Blood Culture (routine x 2)   Specimen: BLOOD  Result Value Ref Range   Specimen Description BLOOD SITE NOT SPECIFIED    Special Requests      BOTTLES DRAWN AEROBIC AND ANAEROBIC Blood Culture adequate volume   Culture      NO GROWTH 5 DAYS Performed at [facility],  [address] [place], [place] [address]    Report Status 01/13/2021 FINAL   MRSA Next Gen by PCR, Nasal   Specimen: Nasal Mucosa; Nasal Swab  Result Value Ref Range   MRSA by PCR Next Gen NOT DETECTED NOT DETECTED  Respiratory (~20 pathogens) panel by PCR   Specimen: Nasal Mucosa; Respiratory  Result Value Ref Range   Adenovirus NOT DETECTED NOT DETECTED   Coronavirus 229E NOT DETECTED NOT DETECTED   Coronavirus HKU1 NOT DETECTED NOT DETECTED   Coronavirus NL63 NOT DETECTED NOT DETECTED   Coronavirus OC43 NOT DETECTED NOT DETECTED   Metapneumovirus NOT DETECTED NOT DETECTED   Rhinovirus / Enterovirus NOT DETECTED NOT DETECTED   Influenza A NOT DETECTED NOT DETECTED   Influenza B NOT DETECTED NOT DETECTED   Parainfluenza Virus 1 NOT DETECTED NOT DETECTED   Parainfluenza Virus 2 NOT DETECTED NOT DETECTED   Parainfluenza Virus 3 NOT DETECTED NOT DETECTED   Parainfluenza Virus 4 NOT DETECTED NOT DETECTED   Respiratory Syncytial Virus NOT DETECTED NOT DETECTED  Bordetella pertussis NOT DETECTED NOT DETECTED   Bordetella Parapertussis NOT DETECTED NOT DETECTED   Chlamydophila pneumoniae NOT DETECTED NOT DETECTED   Mycoplasma pneumoniae NOT DETECTED NOT DETECTED  Culture, Respiratory w Gram Stain   Specimen: Bronchoalveolar Lavage; Respiratory  Result Value Ref Range   Specimen Description BRONCHIAL ALVEOLAR LAVAGE    Special Requests RUL    Gram Stain      RARE WBC PRESENT, PREDOMINANTLY MONONUCLEAR NO ORGANISMS SEEN    Culture      NO GROWTH 2 DAYS Performed at [facility] [address] [place],  [address]    Report Status 01/16/2021 FINAL   Acid Fast Smear (AFB)   Specimen: Bronchial Alveolar Lavage  Result Value Ref Range   AFB Specimen Processing Concentration    Acid Fast Smear Negative    Source (AFB) RUL   Fungus Culture With Stain   Specimen: Bronchial Alveolar Lavage  Result Value Ref Range   Fungus Stain Final report    Fungus (Mycology) Culture  PENDING    Fungal Source BRONCHIAL ALVEOLAR LAVAGE   Fungus Culture Result  Result Value Ref Range   Result 1 Comment   Lactic acid, plasma  Result Value Ref Range   Lactic Acid, Venous 4.1 (HH) 0.5 - 1.9 mmol/L  Lactic acid, plasma  Result Value Ref Range   Lactic Acid, Venous 2.2 (HH) 0.5 - 1.9 mmol/L  Comprehensive metabolic panel  Result Value Ref Range   Sodium 131 (L) 135 - 145 mmol/L   Potassium 3.3 (L) 3.5 - 5.1 mmol/L   Chloride 100 98 - 111 mmol/L   CO2 19 (L) 22 - 32 mmol/L   Glucose, Bld 183 (H) 70 - 99 mg/dL   BUN 7 6 - 20 mg/dL   Creatinine, Ser 0.96 0.61 - 1.24 mg/dL   Calcium 8.6 (L) 8.9 - 10.3 mg/dL   Total Protein 6.8 6.5 - 8.1 g/dL   Albumin 2.9 (L) 3.5 - 5.0 g/dL   AST 45 (H) 15 - 41 U/L   ALT 28 0 - 44 U/L   Alkaline Phosphatase 84 38 - 126 U/L   Total Bilirubin 0.5 0.3 - 1.2 mg/dL   GFR, Estimated >60 >60 mL/min   Anion gap 12 5 - 15  CBC with Differential  Result Value Ref Range   WBC 3.3 (L) 4.0 - 10.5 K/uL   RBC 4.21 (L) 4.22 - 5.81 MIL/uL   Hemoglobin 11.8 (L) 13.0 - 17.0 g/dL   HCT 36.4 (L) 39.0 - 52.0 %   MCV 86.5 80.0 - 100.0 fL   MCH 28.0 26.0 - 34.0 pg   MCHC 32.4 30.0 - 36.0 g/dL   RDW 13.7 11.5 - 15.5 %   Platelets 205 150 - 400 K/uL   nRBC 0.0 0.0 - 0.2 %   Neutrophils Relative % 78 %   Neutro Abs 2.6 1.7 - 7.7 K/uL   Lymphocytes Relative 8 %   Lymphs Abs 0.3 (L) 0.7 - 4.0 K/uL   Monocytes Relative 13 %   Monocytes Absolute 0.4 0.1 - 1.0 K/uL   Eosinophils Relative 0 %   Eosinophils Absolute 0.0 0.0 - 0.5 K/uL   Basophils Relative 0 %   Basophils Absolute 0.0 0.0 - 0.1 K/uL   Immature Granulocytes 1 %   Abs Immature Granulocytes 0.03 0.00 - 0.07 K/uL  Urinalysis, Routine w reflex microscopic Urine, Clean Catch  Result Value Ref Range   Color, Urine YELLOW YELLOW   APPearance CLEAR  CLEAR   Specific Gravity, Urine 1.005 1.005 - 1.030   pH 6.0 5.0 - 8.0   Glucose, UA NEGATIVE NEGATIVE mg/dL   Hgb urine dipstick NEGATIVE  NEGATIVE   Bilirubin Urine NEGATIVE NEGATIVE   Ketones, ur NEGATIVE NEGATIVE mg/dL   Protein, ur NEGATIVE NEGATIVE mg/dL   Nitrite NEGATIVE NEGATIVE   Leukocytes,Ua NEGATIVE NEGATIVE  Protime-INR  Result Value Ref Range   Prothrombin Time 14.0 11.4 - 15.2 seconds   INR 1.1 0.8 - 1.2  APTT  Result Value Ref Range   aPTT 32 24 - 36 seconds  Comprehensive metabolic panel  Result Value Ref Range   Sodium 135 135 - 145 mmol/L   Potassium 3.9 3.5 - 5.1 mmol/L   Chloride 105 98 - 111 mmol/L   CO2 21 (L) 22 - 32 mmol/L   Glucose, Bld 110 (H) 70 - 99 mg/dL   BUN <5 (L) 6 - 20 mg/dL   Creatinine, Ser 0.80 0.61 - 1.24 mg/dL   Calcium 8.3 (L) 8.9 - 10.3 mg/dL   Total Protein 5.6 (L) 6.5 - 8.1 g/dL   Albumin 2.5 (L) 3.5 - 5.0 g/dL   AST 39 15 - 41 U/L   ALT 25 0 - 44 U/L   Alkaline Phosphatase 73 38 - 126 U/L   Total Bilirubin 0.6 0.3 - 1.2 mg/dL   GFR, Estimated >60 >60 mL/min   Anion gap 9 5 - 15  CBC  Result Value Ref Range   WBC 3.4 (L) 4.0 - 10.5 K/uL   RBC 3.66 (L) 4.22 - 5.81 MIL/uL   Hemoglobin 10.4 (L) 13.0 - 17.0 g/dL   HCT 31.5 (L) 39.0 - 52.0 %   MCV 86.1 80.0 - 100.0 fL   MCH 28.4 26.0 - 34.0 pg   MCHC 33.0 30.0 - 36.0 g/dL   RDW 14.0 11.5 - 15.5 %   Platelets 187 150 - 400 K/uL   nRBC 0.0 0.0 - 0.2 %  Legionella Pneumophila Serogp 1 Ur Ag  Result Value Ref Range   L. pneumophila Serogp 1 Ur Ag Negative Negative   Source of Sample URINE, RANDOM   Strep pneumoniae urinary antigen  Result Value Ref Range   Strep Pneumo Urinary Antigen NEGATIVE NEGATIVE  Fungitell, Serum  Result Value Ref Range   Fungitell Result 36 <80 pg/mL  Procalcitonin - Baseline  Result Value Ref Range   Procalcitonin 0.13 ng/mL  CBC  Result Value Ref Range   WBC 3.8 (L) 4.0 - 10.5 K/uL   RBC 3.66 (L) 4.22 - 5.81 MIL/uL   Hemoglobin 10.3 (L) 13.0 - 17.0 g/dL   HCT 31.6 (L) 39.0 - 52.0 %   MCV 86.3 80.0 - 100.0 fL   MCH 28.1 26.0 - 34.0 pg   MCHC 32.6 30.0 - 36.0 g/dL   RDW 13.9 11.5  - 15.5 %   Platelets 211 150 - 400 K/uL   nRBC 0.0 0.0 - 0.2 %  Comprehensive metabolic panel  Result Value Ref Range   Sodium 134 (L) 135 - 145 mmol/L   Potassium 3.4 (L) 3.5 - 5.1 mmol/L   Chloride 104 98 - 111 mmol/L   CO2 22 22 - 32 mmol/L   Glucose, Bld 107 (H) 70 - 99 mg/dL   BUN 6 6 - 20 mg/dL   Creatinine, Ser 0.80 0.61 - 1.24 mg/dL   Calcium 8.3 (L) 8.9 - 10.3 mg/dL   Total Protein 5.5 (L) 6.5 - 8.1 g/dL   Albumin 2.5 (  L) 3.5 - 5.0 g/dL   AST 33 15 - 41 U/L   ALT 23 0 - 44 U/L   Alkaline Phosphatase 65 38 - 126 U/L   Total Bilirubin 0.4 0.3 - 1.2 mg/dL   GFR, Estimated >60 >60 mL/min   Anion gap 8 5 - 15  CBC  Result Value Ref Range   WBC 4.2 4.0 - 10.5 K/uL   RBC 3.92 (L) 4.22 - 5.81 MIL/uL   Hemoglobin 11.3 (L) 13.0 - 17.0 g/dL   HCT 33.1 (L) 39.0 - 52.0 %   MCV 84.4 80.0 - 100.0 fL   MCH 28.8 26.0 - 34.0 pg   MCHC 34.1 30.0 - 36.0 g/dL   RDW 13.9 11.5 - 15.5 %   Platelets 224 150 - 400 K/uL   nRBC 0.0 0.0 - 0.2 %  Basic metabolic panel  Result Value Ref Range   Sodium 134 (L) 135 - 145 mmol/L   Potassium 3.7 3.5 - 5.1 mmol/L   Chloride 105 98 - 111 mmol/L   CO2 22 22 - 32 mmol/L   Glucose, Bld 92 70 - 99 mg/dL   BUN 6 6 - 20 mg/dL   Creatinine, Ser 0.74 0.61 - 1.24 mg/dL   Calcium 8.5 (L) 8.9 - 10.3 mg/dL   GFR, Estimated >60 >60 mL/min   Anion gap 7 5 - 15  CBC  Result Value Ref Range   WBC 4.3 4.0 - 10.5 K/uL   RBC 3.88 (L) 4.22 - 5.81 MIL/uL   Hemoglobin 11.1 (L) 13.0 - 17.0 g/dL   HCT 32.7 (L) 39.0 - 52.0 %   MCV 84.3 80.0 - 100.0 fL   MCH 28.6 26.0 - 34.0 pg   MCHC 33.9 30.0 - 36.0 g/dL   RDW 13.7 11.5 - 15.5 %   Platelets 270 150 - 400 K/uL   nRBC 0.0 0.0 - 0.2 %  Basic metabolic panel  Result Value Ref Range   Sodium 133 (L) 135 - 145 mmol/L   Potassium 4.1 3.5 - 5.1 mmol/L   Chloride 100 98 - 111 mmol/L   CO2 24 22 - 32 mmol/L   Glucose, Bld 108 (H) 70 - 99 mg/dL   BUN 7 6 - 20 mg/dL   Creatinine, Ser 0.79 0.61 - 1.24 mg/dL    Calcium 8.7 (L) 8.9 - 10.3 mg/dL   GFR, Estimated >60 >60 mL/min   Anion gap 9 5 - 15  D-dimer, quantitative  Result Value Ref Range   D-Dimer, Quant 1.14 (H) 0.00 - 0.50 ug/mL-FEU  CBC  Result Value Ref Range   WBC 4.5 4.0 - 10.5 K/uL   RBC 3.84 (L) 4.22 - 5.81 MIL/uL   Hemoglobin 10.8 (L) 13.0 - 17.0 g/dL   HCT 32.4 (L) 39.0 - 52.0 %   MCV 84.4 80.0 - 100.0 fL   MCH 28.1 26.0 - 34.0 pg   MCHC 33.3 30.0 - 36.0 g/dL   RDW 13.7 11.5 - 15.5 %   Platelets 273 150 - 400 K/uL   nRBC 0.0 0.0 - 0.2 %  Comprehensive metabolic panel  Result Value Ref Range   Sodium 133 (L) 135 - 145 mmol/L   Potassium 3.8 3.5 - 5.1 mmol/L   Chloride 96 (L) 98 - 111 mmol/L   CO2 24 22 - 32 mmol/L   Glucose, Bld 107 (H) 70 - 99 mg/dL   BUN 7 6 - 20 mg/dL   Creatinine, Ser 0.85 0.61 - 1.24 mg/dL  Calcium 8.7 (L) 8.9 - 10.3 mg/dL   Total Protein 6.5 6.5 - 8.1 g/dL   Albumin 2.5 (L) 3.5 - 5.0 g/dL   AST 107 (H) 15 - 41 U/L   ALT 55 (H) 0 - 44 U/L   Alkaline Phosphatase 97 38 - 126 U/L   Total Bilirubin 0.6 0.3 - 1.2 mg/dL   GFR, Estimated >60 >60 mL/min   Anion gap 13 5 - 15  CBC with Differential/Platelet  Result Value Ref Range   WBC 4.5 4.0 - 10.5 K/uL   RBC 3.89 (L) 4.22 - 5.81 MIL/uL   Hemoglobin 11.1 (L) 13.0 - 17.0 g/dL   HCT 32.8 (L) 39.0 - 52.0 %   MCV 84.3 80.0 - 100.0 fL   MCH 28.5 26.0 - 34.0 pg   MCHC 33.8 30.0 - 36.0 g/dL   RDW 13.9 11.5 - 15.5 %   Platelets 285 150 - 400 K/uL   nRBC 0.0 0.0 - 0.2 %   Neutrophils Relative % 72 %   Neutro Abs 3.3 1.7 - 7.7 K/uL   Lymphocytes Relative 9 %   Lymphs Abs 0.4 (L) 0.7 - 4.0 K/uL   Monocytes Relative 17 %   Monocytes Absolute 0.8 0.1 - 1.0 K/uL   Eosinophils Relative 0 %   Eosinophils Absolute 0.0 0.0 - 0.5 K/uL   Basophils Relative 0 %   Basophils Absolute 0.0 0.0 - 0.1 K/uL   Immature Granulocytes 2 %   Abs Immature Granulocytes 0.07 0.00 - 0.07 K/uL  Sedimentation rate  Result Value Ref Range   Sed Rate 74 (H) 0 - 16 mm/hr   C-reactive protein  Result Value Ref Range   CRP 23.3 (H) <1.0 mg/dL  CBC  Result Value Ref Range   WBC 4.4 4.0 - 10.5 K/uL   RBC 3.81 (L) 4.22 - 5.81 MIL/uL   Hemoglobin 10.8 (L) 13.0 - 17.0 g/dL   HCT 31.8 (L) 39.0 - 52.0 %   MCV 83.5 80.0 - 100.0 fL   MCH 28.3 26.0 - 34.0 pg   MCHC 34.0 30.0 - 36.0 g/dL   RDW 13.6 11.5 - 15.5 %   Platelets 311 150 - 400 K/uL   nRBC 0.0 0.0 - 0.2 %  Basic metabolic panel  Result Value Ref Range   Sodium 129 (L) 135 - 145 mmol/L   Potassium 3.4 (L) 3.5 - 5.1 mmol/L   Chloride 93 (L) 98 - 111 mmol/L   CO2 24 22 - 32 mmol/L   Glucose, Bld 98 70 - 99 mg/dL   BUN 10 6 - 20 mg/dL   Creatinine, Ser 0.79 0.61 - 1.24 mg/dL   Calcium 8.4 (L) 8.9 - 10.3 mg/dL   GFR, Estimated >60 >60 mL/min   Anion gap 12 5 - 15  Magnesium  Result Value Ref Range   Magnesium 1.9 1.7 - 2.4 mg/dL  ANA w/Reflex if Positive  Result Value Ref Range   Anti Nuclear Antibody (ANA) Negative Negative  ANCA titers  Result Value Ref Range   C-ANCA <1:20 Neg:<1:20 titer   P-ANCA <1:20 Neg:<1:20 titer   Atypical P-ANCA titer <1:20 Neg:<1:20 titer  Aldolase  Result Value Ref Range   Aldolase 12.3 (H) 3.3 - 10.3 U/L  Body fluid cell count with differential  Result Value Ref Range   Fluid Type-FCT [place]    Color, Fluid PINK (A) YELLOW   Appearance, Fluid HAZY (A) CLEAR   Total Nucleated Cell Count, Fluid 370 0 - 1,000  cu mm   Neutrophil Count, Fluid 14 0 - 25 %   Lymphs, Fluid 36 %   Monocyte-Macrophage-Serous Fluid 50 50 - 90 %   Eos, Fluid 0 %  CBC  Result Value Ref Range   WBC 4.1 4.0 - 10.5 K/uL   RBC 4.08 (L) 4.22 - 5.81 MIL/uL   Hemoglobin 11.5 (L) 13.0 - 17.0 g/dL   HCT 34.7 (L) 39.0 - 52.0 %   MCV 85.0 80.0 - 100.0 fL   MCH 28.2 26.0 - 34.0 pg   MCHC 33.1 30.0 - 36.0 g/dL   RDW 13.5 11.5 - 15.5 %   Platelets 338 150 - 400 K/uL   nRBC 0.0 0.0 - 0.2 %  Basic metabolic panel  Result Value Ref Range   Sodium 132 (L) 135 - 145 mmol/L   Potassium  4.0 3.5 - 5.1 mmol/L   Chloride 97 (L) 98 - 111 mmol/L   CO2 25 22 - 32 mmol/L   Glucose, Bld 194 (H) 70 - 99 mg/dL   BUN 14 6 - 20 mg/dL   Creatinine, Ser 0.69 0.61 - 1.24 mg/dL   Calcium 8.9 8.9 - 10.3 mg/dL   GFR, Estimated >60 >60 mL/min   Anion gap 10 5 - 15  Magnesium  Result Value Ref Range   Magnesium 2.4 1.7 - 2.4 mg/dL  CBC  Result Value Ref Range   WBC 12.2 (H) 4.0 - 10.5 K/uL   RBC 3.96 (L) 4.22 - 5.81 MIL/uL   Hemoglobin 11.4 (L) 13.0 - 17.0 g/dL   HCT 33.9 (L) 39.0 - 52.0 %   MCV 85.6 80.0 - 100.0 fL   MCH 28.8 26.0 - 34.0 pg   MCHC 33.6 30.0 - 36.0 g/dL   RDW 13.5 11.5 - 15.5 %   Platelets 406 (H) 150 - 400 K/uL   nRBC 0.0 0.0 - 0.2 %  Basic metabolic panel  Result Value Ref Range   Sodium 132 (L) 135 - 145 mmol/L   Potassium 3.8 3.5 - 5.1 mmol/L   Chloride 94 (L) 98 - 111 mmol/L   CO2 25 22 - 32 mmol/L   Glucose, Bld 90 70 - 99 mg/dL   BUN 10 6 - 20 mg/dL   Creatinine, Ser 0.72 0.61 - 1.24 mg/dL   Calcium 8.6 (L) 8.9 - 10.3 mg/dL   GFR, Estimated >60 >60 mL/min   Anion gap 13 5 - 15  Magnesium  Result Value Ref Range   Magnesium 2.3 1.7 - 2.4 mg/dL  Glucose, capillary  Result Value Ref Range   Glucose-Capillary 145 (H) 70 - 99 mg/dL  Cytology - Non PAP; RUL  Result Value Ref Range   CYTOLOGY - NON GYN      CYTOLOGY - NON PAP CASE: [id number] PATIENT: Cillian Ditto Non-Gynecological Cytology Report     Clinical History: None provided Specimen Submitted:  A. LUNG, RUL, LAVAGE:   FINAL MICROSCOPIC DIAGNOSIS: - Benign reactive/reparative changes  SPECIMEN ADEQUACY: Satisfactory for evaluation  GROSS: Received is/are 15cc's of cloudy pink fluid.(TB:tb) Smears: 2 Concentration Method (Thin Prep):0 Cell Block: 0 Additional Studies: 1 Hematology slides labeled [id number].     Final Diagnosis performed by Vicente Males, MD.   Electronically signed 01/16/2021 Technical component performed at [facility]. [facility], [place] [address], [place], [place] [id number].  Professional component performed at [facility], [place] [address] [place], [place] [address].  Immunohistochemistry Technical component (if applicable) was performed at [facility]. [place]  [address] [place], [place], [place] [address].   IMMUNOHISTOCHEMISTRY DISCLAIMER  (if applicable): Some of these immunohistochemical stains may have been developed and the performance characteristics determine by [facility]. Some may not have been cleared or approved by the U.S. Food and Drug Administration. The FDA has determined that such clearance or approval is not necessary. This test is used for clinical purposes. It should not be regarded as investigational or for research. This laboratory is certified under the [place] (CLIA-88) as qualified to perform high complexity clinical laboratory testing.  The controls stained appropriately.    DG Chest 2 View  Result Date: 01/08/2021 CLINICAL DATA:  shortness of breath EXAM: CHEST - 2 VIEW COMPARISON:  Chest CT 10/30/2020, chest radiograph 05/15/2014 FINDINGS: Cardiomediastinal silhouette is within normal limits. There is patchy airspace disease in the mid to lower lungs bilaterally. There is no large pleural effusion or visible pneumothorax. There is no acute osseous abnormality. Thoracic spondylosis. IMPRESSION: Patchy airspace disease in the mid to lower lungs bilaterally, concerning for multifocal infectious process. Electronically Signed   By: Maurine Simmering   On: 01/08/2021 12:32   CT Angio Chest Pulmonary Embolism (PE) W or WO Contrast  Result Date: 01/12/2021 CLINICAL DATA:  Elevated D-dimer levels, shortness of breath and hypoxia EXAM: CT ANGIOGRAPHY CHEST WITH CONTRAST TECHNIQUE: Multidetector CT imaging of the chest was performed using the standard protocol during bolus administration of intravenous contrast. Multiplanar CT  image reconstructions and MIPs were obtained to evaluate the vascular anatomy. CONTRAST:  61m OMNIPAQUE IOHEXOL 350 MG/ML SOLN COMPARISON:  Chest CT 10/30/2020 FINDINGS: Cardiovascular: No filling defect is identified in the pulmonary arterial tree to suggest pulmonary embolus. Atherosclerotic calcification of the aortic arch and of the left anterior descending coronary artery. Mild to moderate cardiomegaly. Mediastinum/Nodes: Small type 1 hiatal hernia. Right paratracheal node 0.9 cm in short axis on image 49 series 5, previously 0.4 cm. Subcarinal node 1.5 cm in short axis on image 62 series 5, previously 1.0 cm. Borderline right infrahilar adenopathy. Lungs/Pleura: Scattered bilateral airspace opacities in both lungs with associated ground-glass densities and no per particular predominant distribution. No definite secondary pulmonary lobular interstitial accentuation at the lung apices. The small right middle lobe pulmonary nodule seen on the prior exam is not identified today although could be obscured by surrounding airspace opacities. Upper Abdomen: Speckled calcifications in the spleen compatible with old granulomatous disease. Musculoskeletal: Thoracic spondylosis. Review of the MIP images confirms the above findings. IMPRESSION: 1. Bilateral geographic airspace opacities in the lungs with likely reactive or congested lymph nodes in the mediastinum. I favor cryptogenic organizing pneumonia over acute pulmonary edema although there is some underlying cardiomegaly noted. 2. No filling defect is identified in the pulmonary arterial tree to suggest pulmonary embolus. 3. Aortic Atherosclerosis (ICD10-I70.0). Left anterior descending coronary artery atherosclerosis. 4. Small type 1 hiatal hernia. Electronically Signed   By: WVan ClinesM.D.   On: 01/12/2021 14:02   DG Chest Port 1 View  Result Date: 01/15/2021 CLINICAL DATA:  Shortness of breath, interstitial pneumonia EXAM: PORTABLE CHEST 1 VIEW  COMPARISON:  01/13/2021 FINDINGS: Patchy bilateral interstitial and airspace opacities are again noted, not significantly changed since prior study. Heart is normal size. No effusions or pneumothorax. No acute bony abnormality. IMPRESSION: No significant change in multifocal pneumonia. Electronically Signed   By: KRolm BaptiseM.D.   On: 01/15/2021 09:17   DG Chest Port 1 View  Result Date: 01/13/2021 CLINICAL DATA:  Shortness of breath, hypoxia, pneumonia EXAM: PORTABLE CHEST 1 VIEW COMPARISON:  01/08/2021 FINDINGS: Compared to 5 days ago, there is worsening bilateral peripheral mixed interstitial and airspace opacities compatible with multifocal pneumonia including atypical or viral pneumonia. Asymmetric edema is felt to be less likely. No large effusion or pneumothorax. Mild cardiac enlargement without CHF. Trachea midline. Degenerative changes of the spine. IMPRESSION: Bilateral mixed interstitial and airspace opacities compatible with multifocal pneumonia. Electronically Signed   By: Jerilynn Mages.  Shick M.D.   On: 01/13/2021 11:07    ED ECG REPORT   Date: 01/22/2021  Rate: 100  Rhythm: sinus tachycardia  QRS Axis: normal  Intervals: normal  ST/T Wave abnormalities: normal  Conduction Disutrbances:none  Narrative Interpretation:   Old EKG Reviewed: changes noted  I have personally reviewed the EKG tracing    Radiology CXR: bilateral infiltrates  Procedures Procedures   Medications Ordered in ED Medications  methylPREDNISolone sodium succinate (SOLU-MEDROL) 125 mg/2 mL injection 80 mg (has no administration in time range)  albuterol (VENTOLIN HFA) 108 (90 Base) MCG/ACT inhaler 4 puff (has no administration in time range)    ED Course  I have reviewed the triage vital signs and the nursing notes.  Pertinent labs & imaging results that were available during my care of the patient were reviewed by me and considered in my medical decision making (see chart for details).    MDM  Rules/Calculators/A&P                           Iv ns. Continuous pulse ox and cardiac monitoring. O2 6 liters [place]. Ecg. Pcxr. Cultures sent. Stat labs.   Reviewed nursing notes and prior charts for additional history.   CXR reviewed/interpreted by me - bil infiltrates.   IV antibiotics given.   Labs pending.   Signed out to Dr Laverta Baltimore to check labs when resulted, and admit to internal medicine (as had been on their service within past week).      Final Clinical Impression(s) / ED Diagnoses Final diagnoses:  None    Rx / DC Orders ED Discharge Orders     None        Lajean Saver, MD 01/22/21 1514

## 2021-01-22 NOTE — H&P (Addendum)
Date: 01/22/2021               Patient Name:  Randy Greene MRN: [id number]  DOB: 04-30-1966 Age / Sex: 55 y.o., male   PCP:   [facility] Service: Internal Medicine Teaching Service         Attending Physician: Dr. Evette Doffing, Mallie Mussel, *    First Contact: Dr. Raymondo Band  Pager: [phone]  Second Contact: Dr. Marva Panda Pager: [phone]       After Hours (After 5p/  First Contact Pager: [phone]  weekends / holidays): Second Contact Pager: [phone]   Chief Complaint: worsening sob  History of Present Illness:  Randy Greene is a 55 year old gentleman with history of recent COVID infection (June 2022), asthma, OSA on CPAP, repeat aspiration pneumonia in the past, GERD, multiple sclerosis, trigeminal neuralgia presenting to the ED with shortness of breath/worsening O2 requirements and tachycardia.  He was recently admitted for similar symptoms 2 weeks ago where he was found to have pneumonia.  During hospital course, he underwent bronchiolar lavage by pulmonology and results did not show any infectious etiology.  He was started on a course of steroids and was discharged with a 7-day course (one day remaining) on 01/16/2021 along with oxygen at 5L [place].  He notes that he did feel some improvement for a couple days after discharge, but afterwards noted increasing oxygen requirements (6L [place] but still continued feeling shortness of breath) leading him to come to go to his PCP, Randy Greene at [facility], and was told to come to the ED.   Of note, patient states that he did use his albuterol today with minimal relief, but did not use Advair or Singulair.  He had a follow-up appointment with pulmonology made for this week and also mentions that he was due to get a CT angio chest in a couple of days prior to his visit.  Patient mentions that he has noticed a 15 pound weight loss in the past month, going from about 209 pounds to about 191 pounds. He notes that he has been drinking about 2 to 3 L  of water per day.  Patient does have a history of multiple sclerosis.  States that this is chronic and stable.  He takes Ocrevus every 6 months and his symptoms are fairly well controlled with this.  His last dose was 3 months prior.  The only symptoms he experiences when his MS flares are right eye optic neuritis and left leg weakness.  He has not noted the symptoms recently.  ED course: He arrived to the ED via EMS with 10 L via nonrebreather satting at about 96%. He was satting in the 80s on 6 L [place] upon EMS arrival. In the ED, this was weaned to about 9 L . Upon arrival, chest x-ray showed slightly worsening patchy interstitial opacities compared to prior.  He was given a dose of vancomycin and cefepime and also received a dose of Solu-Medrol.  States that he feels that the Solu-Medrol helped him more.  He initially also had sinus tachycardia on EKG, but this improved with fluids.   Meds: Meds confirmed with patient. Pt taking Ocrevus q 6 months for MS and last one was 3 months ago.   Current Meds  Medication Sig   albuterol (PROVENTIL HFA;VENTOLIN HFA) 108 (90 BASE) MCG/ACT inhaler Inhale 2 puffs into the lungs every 6 (six) hours as needed for wheezing or shortness of breath.  alfuzosin (UROXATRAL) 10 MG 24 hr tablet Take 10 mg by mouth daily with breakfast.   amphetamine-dextroamphetamine (ADDERALL) 30 MG tablet Take 30 mg by mouth 2 (two) times daily.   aspirin EC 81 MG tablet Take 81 mg by mouth at bedtime. Swallow whole.   baclofen (LIORESAL) 10 MG tablet Take 15 mg by mouth 4 (four) times daily.    Cholecalciferol (VITAMIN D) 50 MCG (2000 UT) tablet Take 2,000 Units by mouth daily.   diclofenac Sodium (VOLTAREN) 1 % GEL Apply 2 g topically 2 (two) times daily as needed (pain).   dronabinol (MARINOL) 2.5 MG capsule Take 7.5 mg by mouth 3 (three) times daily.    esomeprazole (NEXIUM) 40 MG capsule Take 40 mg by mouth 2 (two) times daily before a meal.   famotidine (PEPCID) 40 MG  tablet Take 40 mg by mouth at bedtime.   finasteride (PROSCAR) 5 MG tablet Take 5 mg by mouth daily.   fluorouracil (EFUDEX) 5 % cream Apply 1 application topically 2 (two) times daily.   fluticasone-salmeterol (ADVAIR) 250-50 MCG/ACT AEPB Inhale 1 puff into the lungs in the morning and at bedtime.   Folic Acid-Vit W4-RXV Q00 (FOLBEE) 2.5-25-1 MG TABS tablet Take 1 tablet by mouth daily.   ketotifen (ZADITOR) 0.025 % ophthalmic solution Place 1 drop into both eyes 2 (two) times daily as needed (itching).   magnesium (MAGTAB) 84 MG (7MEQ) TBCR SR tablet Take 42 mg by mouth 2 (two) times daily.   metoCLOPramide (REGLAN) 10 MG tablet Take 10 mg by mouth daily with supper.   montelukast (SINGULAIR) 10 MG tablet Take 10 mg by mouth daily.   naloxone (NARCAN) nasal spray 4 mg/0.1 mL Place 1 spray into the nose See admin instructions. SPRAY 1 SPRAY INTO ONE NOSTRIL AS DIRECTED FOR OPIOID OVERDOSE (TURN PERSON ON SIDE AFTER DOSE. IF NO RESPONSE IN 2-3 MINUTES OR PERSON RESPONDS BUT RELAPSES, REPEAT USING A NEW SPRAY DEVICE AND SPRAY INTO THE OTHER NOSTRIL. CALL 911 AFTER USE.) * EMERGENCY USE ONLY *   Omega-3 Fatty Acids (FISH OIL PO) Take 1 capsule by mouth daily.   phenytoin (DILANTIN) 100 MG ER capsule Take 100 mg by mouth 2 (two) times daily.   predniSONE (DELTASONE) 10 MG tablet Take 4 tablets (40 mg total) by mouth daily for 7 days.   pregabalin (LYRICA) 150 MG capsule Take 150 mg by mouth See admin instructions. 124m am, 1559mnoon, and 30039mhs.   Probiotic Product (ALIGN) 4 MG CAPS Take 4 mg by mouth in the morning and at bedtime.   simvastatin (ZOCOR) 10 MG tablet Take 10 mg by mouth at bedtime.   traMADol (ULTRAM) 50 MG tablet Take 50 mg by mouth every 6 (six) hours as needed for moderate pain or severe pain.   vitamin C (ASCORBIC ACID) 500 MG tablet Take 500 mg by mouth daily.   vitamin E 180 MG (400 UNITS) capsule Take 400 Units by mouth daily.   Zinc 50 MG TABS Take 50 mg by mouth every  other day.     Allergies: Allergies as of 01/22/2021 - Review Complete 01/22/2021  Allergen Reaction Noted   Penicillins Hives and Other (See Comments) 01/01/2014   Tape Rash 10/27/2011   Zoloft [sertraline] Rash 12/15/2013   Food Nausea And Vomiting and Other (See Comments) 01/01/2014   Morphine Itching 07/14/2012   Mushroom extract complex Nausea And Vomiting and Other (See Comments) 01/01/2014   Morphine and related Itching 03/02/2014   Penicillin g  Rash 03/01/2007   Past Medical History:  Diagnosis Date   Acid reflux    Arthritis    Aspiration pneumonia (HCC)    Asthma    Delayed wound healing    left knee   Dyspnea    Enlarged prostate    Hypercapnic respiratory failure, chronic (HCC)    Hypertension    MS (multiple sclerosis) (HCC)     Family History:  Family History  Problem Relation Age of Onset   Dementia Mother    Hypertension Father    Heart attack Father      Social History:  Patient lives alone but states his 13 year old son visits him regularly. He has friends living nearby and stated he has strong support system. He is former smoker with 25 years of 1-1.5 ppd history. He has quit since 15 years ago. No alcohol use or illicit drug use.   Review of Systems: Review of Systems  Constitutional:  Positive for weight loss. Negative for chills, fever and malaise/fatigue.  HENT:  Negative for hearing loss and tinnitus.   Eyes:  Negative for blurred vision and double vision.  Respiratory:  Positive for shortness of breath. Negative for cough, hemoptysis, sputum production and wheezing.   Cardiovascular:  Positive for chest pain. Negative for palpitations.  Gastrointestinal:  Negative for abdominal pain, blood in stool, constipation, diarrhea, heartburn, melena, nausea and vomiting.  Genitourinary:  Negative for dysuria, flank pain, frequency, hematuria and urgency.  Musculoskeletal:  Positive for back pain and joint pain. Negative for falls, myalgias and neck  pain.  Skin:  Negative for itching and rash.  Neurological:  Positive for sensory change (some LE numbness) and headaches (frontal). Negative for dizziness, tingling, tremors, speech change, focal weakness, seizures, loss of consciousness and weakness.  Endo/Heme/Allergies:  Negative for environmental allergies and polydipsia. Does not bruise/bleed easily.  Psychiatric/Behavioral:  Negative for depression, hallucinations, memory loss, substance abuse and suicidal ideas. The patient is not nervous/anxious and does not have insomnia.     Physical Exam: Blood pressure (!) 122/92, pulse 71, temperature 98.9 F (37.2 C), temperature source Oral, resp. rate (!) 22, height _0  (1.88 m), weight 88 kg, SpO2 96 %. Physical Exam Constitutional:      General: He is not in acute distress.    Appearance: He is well-developed. He is not ill-appearing.  HENT:     Head: Normocephalic and atraumatic.  Eyes:     Extraocular Movements: Extraocular movements intact.     Pupils: Pupils are equal, round, and reactive to light.  Neck:     Vascular: No JVD.  Cardiovascular:     Rate and Rhythm: Normal rate and regular rhythm.     Heart sounds: No murmur heard. Pulmonary:     Effort: Pulmonary effort is normal.     Comments: Bibasilar rales on exam. No wheezing or rhonchi noted. Chest:     Chest wall: No mass or deformity.  Abdominal:     General: Bowel sounds are normal.     Palpations: Abdomen is soft.     Tenderness: There is no guarding or rebound.  Musculoskeletal:        General: Normal range of motion.     Cervical back: Normal range of motion and neck supple.     Right lower leg: No tenderness. No edema.     Left lower leg: No tenderness. No edema.     Comments: Calf size equivalent bilaterally.  Skin:    General: Skin is  warm and dry.     Capillary Refill: Capillary refill takes less than 2 seconds.     Findings: No erythema.     Comments: Decreased skin turgor in bilateral hands and legs   Neurological:     General: No focal deficit present.     Mental Status: He is alert and oriented to person, place, and time.  Psychiatric:        Mood and Affect: Mood normal.        Behavior: Behavior normal.     EKG: personally reviewed my interpretation is sinus tachycardia.  CXR: personally reviewed my interpretation is infiltrates consistent with pneumonia. Per radiologist impression consistent with Cryptogenic Organizing Pneumonia.   Assessment & Plan by Problem:  Principal Problem:   Acute on chronic respiratory failure with hypoxia (HCC) Active Problems:   MS (multiple sclerosis) (HCC)   OSA on CPAP   NSIP (nonspecific interstitial pneumonitis) (HCC)  Acute on chronic hypoxic respiratory failure Patient presented to ED via EMS with SOB, tachycardia, and hypotension. Ddx viral pneumonia vs Cryptogenic Organizing Pneumonia vs Pulmonary Embolism vs lung cancer. Most likely is cryptogenic organizing pneumonia as patient has symptoms of pneumonia but no definite cause for it. He does not have fever, cough, leukocytosis making viral or bacterial pneumonia less likely. He was tested with bacterial culture which were negative. BAL was performed but did not show any significant findings. Respiratory panel was also negative, although this may not necessarily exclude viral PNA. Another likely diagnosis is lung malignancy. Patient has weight loss, loss of appetite, hyponatremia, and significant smoking history. This may be hiding behind an acute inflammatory process on imaging.  Most concerning but least likely is PE. I do not suspect this is PE as patient's tachycardia resolved with fluids and he has another likely etiology for his current presentation given worsening infiltrates on CXR. Modified Wells <4, but will obtain d-dimer for confirmation and further guidance. Will discontinue antibiotics as this does not appear to be bacterial in nature. -Albuterol prn, singulair, breo ellipta -blood  cultures pending -f/u CBC, legionella UA, Strep PNA UA, expectorated sputum w/gram stain  Hyponatremia Patient has hyponatremia which appears to be chronic.  He does have some concurrent polydipsia so there is suspicion for SIADH.  Does note to have some recent weight loss of 15 pounds in the past month.  Cannot completely rule out underlying lung malignancy, although prior imaging does not reflect this. He does not have recent imaging prior to onset of COVID for comparison.  -1L [place] -f/u BMP in AM   Multiple Sclerosis   Patient has MS that he states is very well controlled. His symptoms include optic neuritis and left leg weakness. Has residual weakness on left side and paresthesias  -continue home dronabinol 2.5 mg , baclofen 10 mg 4x daily -metoclopramide 10 mg daily   GERD Patient has history of GERD. States his home dosing is nexium 63m BID and pepcid 415mdaily. -start protonix 4039maily and home pepcid 6m67mHypertension Patient has history of hypertension. Was hypotensive this ED visit. Currently 127/91. Patient was on Cardura 4 mg but it was held during last admission until follow up. Currently not taking anything.  -Continue to monitor   Hyperlipidemia Patient has history of hyperlipidemia -Continue home simvastatin 10 and aspirin 81   BPH Patient has BPH that is controlled. He states he has good urinary function.  -continue home alfuzosin 10 mg and finasteride 5 mg   Trigeminal Neuralgia Patient  has trigeminal neuralgia secondary to Multiple Sclerosis.  -continue home phenytoin 100 BID, pregabalin 150 QHS -tramadol 50 Q6 PRN   OSA on CPAP Patient has OSA with CPAP at home. Not using CPAP recently as he is having to use nasal cannula. -Continue CPAP nightly   Asthma Patient has history of asthma. On albuterol, singulair, and advair at home. -Continue home albuterol, breo ellipta, and singulair   Dispo: Admit patient to Inpatient with expected length of stay  greater than 2 midnights.  Signed: Idamae Schuller, MD 01/22/2021, 7:37 PM  Pager: [phone] After 5pm on weekdays and 1pm on weekends: On Call pager: [phone]

## 2021-01-22 NOTE — Progress Notes (Signed)
Pharmacy Antibiotic Note  Randy Greene is a 55 y.o. male admitted on 01/22/2021 with pneumonia.  Pharmacy has been consulted for vancomycin dosing. Patient was dxed with COVID and then developed multifocal pneumonia where he was hospitalized from 7/20-7/28. He was discharged on 5L O2. Since discharge he has had return of chills, sweats, SOB, and persistent tachycardia. WBC 12.2 7/28 and afebrile.  Plan: Vancomycin 2000 mg IV loading dose Vancomycin 1750 mg IV q12h (AUC 480.6, Scr 0.72) Monitor renal function and abx plan  Height: 6\' 2"  (188 cm) Weight: 88 kg (194 lb) IBW/kg (Calculated) : 82.2  Temp (24hrs), Avg:98.9 F (37.2 C), Min:98.9 F (37.2 C), Max:98.9 F (37.2 C)  Recent Labs  Lab 01/16/21 0101  WBC 12.2*  CREATININE 0.72    Estimated Creatinine Clearance: 119.9 mL/min (by C-G formula based on SCr of 0.72 mg/dL).    Allergies  Allergen Reactions   Penicillins Hives and Other (See Comments)    [ +FAMILY HISTORY] Has patient had a PCN reaction causing immediate rash, facial/tongue/throat swelling, SOB or lightheadedness with hypotension: Unknown Has patient had a PCN reaction causing severe rash involving mucus membranes or skin necrosis: Unknown Has patient had a PCN reaction that required hospitalization: No Has patient had a PCN reaction occurring within the last 10 years: No If all of the above answers are "NO", then may proceed with Cephalosporin use.     Tape Rash   Zoloft [Sertraline] Rash   Food Nausea And Vomiting and Other (See Comments)    GREEN PEPPERS- flushed  PT DOES NOT EAT MEAT   Morphine Itching   Mushroom Extract Complex Nausea And Vomiting and Other (See Comments)    And flushed   Morphine And Related Itching   Penicillin G Rash    Antimicrobials this admission: Vancomycin 8/3 >>  Cefepime 8/3 x1  Microbiology results: 8/3 BCx: collected 8/3 UCx: collected    Thank you for allowing pharmacy to be a part of this patient's  care.  Varney Daily, PharmD PGY1 Pharmacy Resident  Please check AMION for all [facility] pharmacy phone numbers After 10:00 PM call main pharmacy [phone]

## 2021-01-23 ENCOUNTER — Encounter (HOSPITAL_COMMUNITY): Payer: Self-pay | Admitting: Student in an Organized Health Care Education/Training Program

## 2021-01-23 ENCOUNTER — Observation Stay (HOSPITAL_COMMUNITY): Payer: No Typology Code available for payment source

## 2021-01-23 ENCOUNTER — Other Ambulatory Visit (HOSPITAL_COMMUNITY): Payer: No Typology Code available for payment source

## 2021-01-23 ENCOUNTER — Inpatient Hospital Stay (HOSPITAL_COMMUNITY): Payer: No Typology Code available for payment source

## 2021-01-23 DIAGNOSIS — H469 Unspecified optic neuritis: Secondary | ICD-10-CM | POA: Diagnosis present

## 2021-01-23 DIAGNOSIS — U071 COVID-19: Secondary | ICD-10-CM | POA: Diagnosis not present

## 2021-01-23 DIAGNOSIS — J8489 Other specified interstitial pulmonary diseases: Secondary | ICD-10-CM | POA: Diagnosis not present

## 2021-01-23 DIAGNOSIS — G4733 Obstructive sleep apnea (adult) (pediatric): Secondary | ICD-10-CM | POA: Diagnosis present

## 2021-01-23 DIAGNOSIS — J45909 Unspecified asthma, uncomplicated: Secondary | ICD-10-CM | POA: Diagnosis present

## 2021-01-23 DIAGNOSIS — E785 Hyperlipidemia, unspecified: Secondary | ICD-10-CM | POA: Diagnosis present

## 2021-01-23 DIAGNOSIS — K219 Gastro-esophageal reflux disease without esophagitis: Secondary | ICD-10-CM | POA: Diagnosis present

## 2021-01-23 DIAGNOSIS — Z9981 Dependence on supplemental oxygen: Secondary | ICD-10-CM | POA: Diagnosis not present

## 2021-01-23 DIAGNOSIS — Z6824 Body mass index (BMI) 24.0-24.9, adult: Secondary | ICD-10-CM | POA: Diagnosis not present

## 2021-01-23 DIAGNOSIS — J9621 Acute and chronic respiratory failure with hypoxia: Secondary | ICD-10-CM | POA: Diagnosis present

## 2021-01-23 DIAGNOSIS — Z8249 Family history of ischemic heart disease and other diseases of the circulatory system: Secondary | ICD-10-CM | POA: Diagnosis not present

## 2021-01-23 DIAGNOSIS — M7989 Other specified soft tissue disorders: Secondary | ICD-10-CM | POA: Diagnosis not present

## 2021-01-23 DIAGNOSIS — R64 Cachexia: Secondary | ICD-10-CM | POA: Diagnosis present

## 2021-01-23 DIAGNOSIS — G500 Trigeminal neuralgia: Secondary | ICD-10-CM | POA: Diagnosis present

## 2021-01-23 DIAGNOSIS — N400 Enlarged prostate without lower urinary tract symptoms: Secondary | ICD-10-CM | POA: Diagnosis present

## 2021-01-23 DIAGNOSIS — J690 Pneumonitis due to inhalation of food and vomit: Secondary | ICD-10-CM | POA: Diagnosis present

## 2021-01-23 DIAGNOSIS — U099 Post covid-19 condition, unspecified: Secondary | ICD-10-CM | POA: Diagnosis present

## 2021-01-23 DIAGNOSIS — I5030 Unspecified diastolic (congestive) heart failure: Secondary | ICD-10-CM | POA: Diagnosis not present

## 2021-01-23 DIAGNOSIS — R631 Polydipsia: Secondary | ICD-10-CM | POA: Diagnosis present

## 2021-01-23 DIAGNOSIS — Z20822 Contact with and (suspected) exposure to covid-19: Secondary | ICD-10-CM | POA: Diagnosis present

## 2021-01-23 DIAGNOSIS — G35 Multiple sclerosis: Secondary | ICD-10-CM | POA: Diagnosis present

## 2021-01-23 DIAGNOSIS — C3490 Malignant neoplasm of unspecified part of unspecified bronchus or lung: Secondary | ICD-10-CM | POA: Diagnosis present

## 2021-01-23 DIAGNOSIS — I10 Essential (primary) hypertension: Secondary | ICD-10-CM | POA: Diagnosis present

## 2021-01-23 DIAGNOSIS — J9690 Respiratory failure, unspecified, unspecified whether with hypoxia or hypercapnia: Secondary | ICD-10-CM | POA: Diagnosis present

## 2021-01-23 DIAGNOSIS — Z96652 Presence of left artificial knee joint: Secondary | ICD-10-CM | POA: Diagnosis present

## 2021-01-23 DIAGNOSIS — J84116 Cryptogenic organizing pneumonia: Secondary | ICD-10-CM | POA: Diagnosis present

## 2021-01-23 DIAGNOSIS — E871 Hypo-osmolality and hyponatremia: Secondary | ICD-10-CM | POA: Diagnosis present

## 2021-01-23 DIAGNOSIS — Z818 Family history of other mental and behavioral disorders: Secondary | ICD-10-CM | POA: Diagnosis not present

## 2021-01-23 DIAGNOSIS — Z880 Allergy status to penicillin: Secondary | ICD-10-CM | POA: Diagnosis not present

## 2021-01-23 DIAGNOSIS — J189 Pneumonia, unspecified organism: Secondary | ICD-10-CM | POA: Diagnosis present

## 2021-01-23 LAB — CBC WITH DIFFERENTIAL/PLATELET
Abs Immature Granulocytes: 0.29 10*3/uL — ABNORMAL HIGH (ref 0.00–0.07)
Basophils Absolute: 0 10*3/uL (ref 0.0–0.1)
Basophils Relative: 0 %
Eosinophils Absolute: 0 10*3/uL (ref 0.0–0.5)
Eosinophils Relative: 0 %
HCT: 32.3 % — ABNORMAL LOW (ref 39.0–52.0)
Hemoglobin: 10.4 g/dL — ABNORMAL LOW (ref 13.0–17.0)
Immature Granulocytes: 4 %
Lymphocytes Relative: 10 %
Lymphs Abs: 0.7 10*3/uL (ref 0.7–4.0)
MCH: 27.9 pg (ref 26.0–34.0)
MCHC: 32.2 g/dL (ref 30.0–36.0)
MCV: 86.6 fL (ref 80.0–100.0)
Monocytes Absolute: 0.5 10*3/uL (ref 0.1–1.0)
Monocytes Relative: 8 %
Neutro Abs: 5.4 10*3/uL (ref 1.7–7.7)
Neutrophils Relative %: 78 %
Platelets: 369 10*3/uL (ref 150–400)
RBC: 3.73 MIL/uL — ABNORMAL LOW (ref 4.22–5.81)
RDW: 14.6 % (ref 11.5–15.5)
WBC: 7 10*3/uL (ref 4.0–10.5)
nRBC: 0 % (ref 0.0–0.2)

## 2021-01-23 LAB — BRAIN NATRIURETIC PEPTIDE: B Natriuretic Peptide: 15.6 pg/mL (ref 0.0–100.0)

## 2021-01-23 LAB — COMPREHENSIVE METABOLIC PANEL
ALT: 182 U/L — ABNORMAL HIGH (ref 0–44)
AST: 123 U/L — ABNORMAL HIGH (ref 15–41)
Albumin: 2.2 g/dL — ABNORMAL LOW (ref 3.5–5.0)
Alkaline Phosphatase: 181 U/L — ABNORMAL HIGH (ref 38–126)
Anion gap: 10 (ref 5–15)
BUN: 11 mg/dL (ref 6–20)
CO2: 24 mmol/L (ref 22–32)
Calcium: 9.1 mg/dL (ref 8.9–10.3)
Chloride: 100 mmol/L (ref 98–111)
Creatinine, Ser: 0.61 mg/dL (ref 0.61–1.24)
GFR, Estimated: >60 mL/min (ref >60)
Glucose, Bld: 171 mg/dL — ABNORMAL HIGH (ref 70–99)
Potassium: 3.6 mmol/L (ref 3.5–5.1)
Sodium: 134 mmol/L — ABNORMAL LOW (ref 135–145)
Total Bilirubin: 0.4 mg/dL (ref 0.3–1.2)
Total Protein: 6.1 g/dL — ABNORMAL LOW (ref 6.5–8.1)

## 2021-01-23 LAB — HIV ANTIBODY (ROUTINE TESTING W REFLEX): HIV Screen 4th Generation wRfx: NONREACTIVE

## 2021-01-23 MED ORDER — POTASSIUM CHLORIDE CRYS ER 20 MEQ PO TBCR
40.0000 meq | EXTENDED_RELEASE_TABLET | Freq: Two times a day (BID) | ORAL | Status: AC
  Administered 2021-01-23 (×2): 40 meq via ORAL
  Filled 2021-01-23 (×2): qty 2

## 2021-01-23 MED ORDER — SULFAMETHOXAZOLE-TRIMETHOPRIM 800-160 MG PO TABS
1.0000 | ORAL_TABLET | ORAL | Status: DC
  Administered 2021-01-24 – 2021-01-27 (×2): 1 via ORAL
  Filled 2021-01-23 (×2): qty 1

## 2021-01-23 MED ORDER — FUROSEMIDE 10 MG/ML IJ SOLN
40.0000 mg | Freq: Three times a day (TID) | INTRAMUSCULAR | Status: AC
  Administered 2021-01-23 (×2): 40 mg via INTRAVENOUS
  Filled 2021-01-23 (×2): qty 4

## 2021-01-23 MED ORDER — METHYLPREDNISOLONE SODIUM SUCC 40 MG IJ SOLR
40.0000 mg | Freq: Two times a day (BID) | INTRAMUSCULAR | Status: DC
  Administered 2021-01-23 – 2021-01-27 (×7): 40 mg via INTRAVENOUS
  Filled 2021-01-23 (×8): qty 1

## 2021-01-23 NOTE — Consult Note (Signed)
NAME:  Randy Greene, MRN:  [id number], DOB:  [birth year], LOS: 0 ADMISSION DATE:  01/22/2021, CONSULTATION DATE:  01/23/21  REFERRING MD: IM TS, CHIEF COMPLAINT: Chest pain, dyspnea  History of Present Illness:  55 year old man known to practice. History of MS on ocrelizumab q32mo, asthma, had COVID and post-COVID NSIP admitted 7/20-7/28 Bronch r/o concomittant infection neg 7/26 Rheum workup neg Trial of steroids with some improvement Went home then started feeling bad again with worsening DOE Remains on 5-6L at rest, desats with any movement. Denies aspiration symptoms but has had in past PCCM asked to see what else can be done No sick contacts, no fevers, occasional productive cough with MDI use   Pertinent  Medical History  MS on immunosuppression drug Diastolic dysfunction  Recent COVID infection in 11/2020  Significant Hospital Events: Including procedures, antibiotic start and stop dates in addition to other pertinent events   8/3 readmitted  Interim History / Subjective:  Consulted  Objective   Blood pressure 122/84, pulse 66, temperature (!) 97.5 F (36.4 C), temperature source Oral, resp. rate (!) 23, height 6\' 2"  (1.88 m), weight 88 kg, SpO2 100 %.        Intake/Output Summary (Last 24 hours) at 01/23/2021 1032 Last data filed at 01/22/2021 2140 Gross per 24 hour  Intake 1000 ml  Output --  Net 1000 ml    Filed Weights   01/22/21 1428  Weight: 88 kg    Examination: Constitutional: no distress  Eyes: EOMI, pupils equal, temporal wasting Ears, nose, mouth, and throat: MM dry, trachea midline Cardiovascular: RRR, ext warm Respiratory: crackles BL, mildly tachypneic Gastrointestinal: soft, +BS Skin: No rashes, normal turgor Neurologic: moves all 4 ext to command Psychiatric: excellent insight Ext: a bit more swollen: trace pretibial edema  Mild hyponatremia Transaminitis ?congestion  Resolved Hospital Problem list     Assessment & Plan:  Acute and  maybe now chronic hypoxemic respiratory failure due to post COVID NSIP- chronic aspiration and dilantin pneumonitis in differential but less c/w clinical hx.  No signs of new infection.  Fibrotic changes apparent on recent CT.   Could have some extra volume on board given exam. Transaminitis- new, unclear cause; congestion? AED effect? MS on biologic Unintentional weight loss post covid- pulmonary cachexia and poor PO likely explaining; should r/o chronic aspiration causing food aversion  - IV steroids - Push diuresis - Bactrim PJP ppx - MBSS r/o concomitant chronic aspiration syndrome - Check LE duplex, echo, RUQ [place] - No other good agents for his trigeminal neuralgia (carbamazepine caused severe hyponatremia) and low suspicion for this drug exasperating things, fine to leave as is - Reviewed lit for his ocrelizumab, no link with pneumonitis; ongoing research regarding COVID in this population  ref Catheryn Bacon et al. COVID-19 in ocrelizumab-treated people with multiple sclerosis. Mult Scler Relat Disord. 2021 Apr;49:102725.   Best Practice (right click and "Reselect all SmartList Selections" daily)   Per primary  Labs   CBC: Recent Labs  Lab 01/22/21 1431 01/23/21 0320  WBC 8.5 7.0  NEUTROABS  --  5.4  HGB 10.5* 10.4*  HCT 32.8* 32.3*  MCV 87.2 86.6  PLT 367 369     Basic Metabolic Panel: Recent Labs  Lab 01/22/21 1431 01/23/21 0320  NA 131* 134*  K 4.2 3.6  CL 99 100  CO2 20* 24  GLUCOSE 119* 171*  BUN 10 11  CREATININE 0.85 0.61  CALCIUM 8.3* 9.1    GFR: Estimated Creatinine Clearance:  119.9 mL/min (by C-G formula based on SCr of 0.61 mg/dL). Recent Labs  Lab 01/22/21 1431 01/22/21 1515 01/23/21 0320  WBC 8.5  --  7.0  LATICACIDVEN  --  1.7  --      Liver Function Tests: Recent Labs  Lab 01/22/21 1431 01/23/21 0320  AST 93* 123*  ALT 158* 182*  ALKPHOS 170* 181*  BILITOT 0.6 0.4  PROT 6.1* 6.1*  ALBUMIN 2.2* 2.2*    No results for input(s):  LIPASE, AMYLASE in the last 168 hours. No results for input(s): AMMONIA in the last 168 hours.  ABG    Component Value Date/Time   PHART 7.493 (H) 03/06/2014 0154   PCO2ART 34.6 (L) 03/06/2014 0154   PO2ART 57.9 (L) 03/06/2014 0154   HCO3 26.3 (H) 03/06/2014 0154   TCO2 27.3 03/06/2014 0154   O2SAT 91.0 03/06/2014 0154      Coagulation Profile: No results for input(s): INR, PROTIME in the last 168 hours.   Cardiac Enzymes: No results for input(s): CKTOTAL, CKMB, CKMBINDEX, TROPONINI in the last 168 hours.  HbA1C: No results found for: HGBA1C  CBG: No results for input(s): GLUCAP in the last 168 hours.  Review of Systems:    Positive Symptoms in bold:  Constitutional fevers, chills, weight loss, fatigue, anorexia, malaise  Eyes decreased vision, double vision, eye irritation  Ears, Nose, Mouth, Throat sore throat, trouble swallowing, sinus congestion  Cardiovascular chest pain, paroxysmal nocturnal dyspnea, lower ext edema, palpitations   Respiratory SOB, cough, DOE, hemoptysis, wheezing  Gastrointestinal nausea, vomiting, diarrhea  Genitourinary burning with urination, trouble urinating  Musculoskeletal joint aches, joint swelling, back pain  Integumentary  rashes, skin lesions  Neurological focal weakness, focal numbness, trouble speaking, headaches  Psychiatric depression, anxiety, confusion  Endocrine polyuria, polydipsia, cold intolerance, heat intolerance  Hematologic abnormal bruising, abnormal bleeding, unexplained nose bleeds  Allergic/Immunologic recurrent infections, hives, swollen lymph nodes     Past Medical History:  He,  has a past medical history of Acid reflux, Arthritis, Aspiration pneumonia ([place]), Asthma, Delayed wound healing, Dyspnea, Enlarged prostate, Hypercapnic respiratory failure, chronic ([place]), Hypertension, and MS (multiple sclerosis) ([place]).   Surgical History:   Past Surgical History:  Procedure Laterality Date   ARTHROSCOPIC      BRONCHIAL WASHINGS  01/14/2021   Procedure: BRONCHIAL WASHINGS;  Surgeon: Candee Furbish, MD;  Location: [facility] ENDOSCOPY;  Service: Pulmonary;;   HERNIA REPAIR     INCISION AND DRAINAGE Left 01/04/2017   Procedure: INCISION AND DRAINAGE LEFT KNEE;  Surgeon: Rod Can, MD;  Location: [place];  Service: Orthopedics;  Laterality: Left;   jawsurgery     KNEE ARTHROPLASTY Left 08/10/2016   Procedure: LEFT TOTAL KNEE ARTHROPLASTY WITH COMPUTER NAVIGATION;  Surgeon: Rod Can, MD;  Location: [place];  Service: Orthopedics;  Laterality: Left;   knees scope     VIDEO BRONCHOSCOPY Bilateral 01/14/2021   Procedure: VIDEO BRONCHOSCOPY WITHOUT FLUORO;  Surgeon: Candee Furbish, MD;  Location: [facility] ENDOSCOPY;  Service: Pulmonary;  Laterality: Bilateral;     Social History:   reports that he quit smoking about 14 years ago. His smoking use included cigarettes. He has a 20.00 pack-year smoking history. He has never used smokeless tobacco. He reports current alcohol use. He reports that he does not use drugs.   Family History:  His family history includes Dementia in his mother; Heart attack in his father; Hypertension in his father.   Allergies Allergies  Allergen Reactions   Penicillins Hives and Other (See  Comments)    [ +FAMILY HISTORY] Has patient had a PCN reaction causing immediate rash, facial/tongue/throat swelling, SOB or lightheadedness with hypotension: Unknown Has patient had a PCN reaction causing severe rash involving mucus membranes or skin necrosis: Unknown Has patient had a PCN reaction that required hospitalization: No Has patient had a PCN reaction occurring within the last 10 years: No If all of the above answers are "NO", then may proceed with Cephalosporin use.     Tape Rash   Zoloft [Sertraline] Rash   Food Nausea And Vomiting and Other (See Comments)    GREEN PEPPERS- flushed  PT DOES NOT EAT MEAT   Morphine Itching   Mushroom Extract Complex Nausea And Vomiting and Other  (See Comments)    And flushed   Morphine And Related Itching   Penicillin G Rash     Home Medications  Prior to Admission medications   Medication Sig Start Date End Date Taking? Authorizing Provider  Acetaminophen-Codeine 300-30 MG tablet Take 1 tablet by mouth 2 (two) times daily as needed for pain. 08/26/20  Yes [provider]  albuterol (PROVENTIL HFA;VENTOLIN HFA) 108 (90 BASE) MCG/ACT inhaler Inhale 2 puffs into the lungs every 6 (six) hours as needed for wheezing or shortness of breath.   Yes [provider]  alfuzosin (UROXATRAL) 10 MG 24 hr tablet Take 10 mg by mouth daily with breakfast.   Yes [provider]  amLODipine (NORVASC) 10 MG tablet Take 5 mg by mouth at bedtime.   Yes [provider]  amphetamine-dextroamphetamine (ADDERALL) 30 MG tablet Take 30 mg by mouth 2 (two) times daily.   Yes [provider]  aspirin EC 81 MG tablet Take 81 mg by mouth at bedtime. Swallow whole.   Yes [provider]  baclofen (LIORESAL) 10 MG tablet Take 15 mg by mouth 4 (four) times daily.    Yes [provider]  Calcium Carbonate 500 (200 Ca) MG WAFR Take 1 tablet by mouth in the morning and at bedtime.   Yes [provider]  cetirizine (ZYRTEC) 10 MG tablet Take 10 mg by mouth daily.   Yes [provider]  Cholecalciferol (VITAMIN D) 50 MCG (2000 UT) tablet Take 2,000 Units by mouth daily.   Yes [provider]  cyanocobalamin (,VITAMIN B-12,) 1000 MCG/ML injection Inject 1,000 mcg into the muscle once.   Yes [provider]  diclofenac Sodium (VOLTAREN) 1 % GEL Apply 2 g topically 2 (two) times daily as needed (pain). 11/27/19  Yes [provider]  dronabinol (MARINOL) 2.5 MG capsule Take 7.5 mg by mouth 3 (three) times daily.    Yes [provider]  esomeprazole (NEXIUM) 40 MG capsule Take 40 mg by mouth 2 (two) times daily before a meal.   Yes [provider]   famotidine (PEPCID) 40 MG tablet Take 40 mg by mouth at bedtime.   Yes [provider]  finasteride (PROSCAR) 5 MG tablet Take 5 mg by mouth daily.   Yes [provider]  fluorouracil (EFUDEX) 5 % cream Apply 1 application topically 2 (two) times daily.   Yes [provider]  fluticasone-salmeterol (ADVAIR) 250-50 MCG/ACT AEPB Inhale 1 puff into the lungs in the morning and at bedtime.   Yes [provider]  Folic Acid-Vit X3-GHW E99 (FOLBEE) 2.5-25-1 MG TABS tablet Take 1 tablet by mouth daily.   Yes [provider]  ipratropium (ATROVENT) 0.03 % nasal spray Place 2 sprays into both nostrils 2 (  two) times daily. 11/13/19  Yes [provider]  ketotifen (ZADITOR) 0.025 % ophthalmic solution Place 1 drop into both eyes 2 (two) times daily as needed (itching).   Yes [provider]  losartan (COZAAR) 100 MG tablet Take 50 mg by mouth daily.   Yes [provider]  magnesium (MAGTAB) 84 MG (7MEQ) TBCR SR tablet Take 42 mg by mouth 2 (two) times daily.   Yes [provider]  metoCLOPramide (REGLAN) 10 MG tablet Take 10 mg by mouth daily with supper.   Yes [provider]  montelukast (SINGULAIR) 10 MG tablet Take 10 mg by mouth daily.   Yes [provider]  naloxone (NARCAN) nasal spray 4 mg/0.1 mL Place 1 spray into the nose See admin instructions. SPRAY 1 SPRAY INTO ONE NOSTRIL AS DIRECTED FOR OPIOID OVERDOSE (TURN PERSON ON SIDE AFTER DOSE. IF NO RESPONSE IN 2-3 MINUTES OR PERSON RESPONDS BUT RELAPSES, REPEAT USING A NEW SPRAY DEVICE AND SPRAY INTO THE OTHER NOSTRIL. CALL 911 AFTER USE.) * EMERGENCY USE ONLY * 01/16/20  Yes [provider]  Omega-3 Fatty Acids (FISH OIL PO) Take 1 capsule by mouth daily.   Yes [provider]  ondansetron (ZOFRAN) 4 MG tablet Take 4 mg by mouth every 8 (eight) hours as needed for nausea or vomiting.   Yes [provider]  phenytoin (DILANTIN) 100  MG ER capsule Take 100 mg by mouth 2 (two) times daily. 07/18/20  Yes [provider]  pregabalin (LYRICA) 150 MG capsule Take 150 mg by mouth See admin instructions. 150mg  am, 150mg  noon, and 300mg  qhs.   Yes [provider]  Probiotic Product (ALIGN) 4 MG CAPS Take 4 mg by mouth in the morning and at bedtime.   Yes [provider]  sildenafil (VIAGRA) 100 MG tablet Take 100 mg by mouth daily as needed for erectile dysfunction.   Yes [provider]  simvastatin (ZOCOR) 10 MG tablet Take 10 mg by mouth at bedtime.   Yes [provider]  traMADol (ULTRAM) 50 MG tablet Take 50 mg by mouth every 6 (six) hours as needed for moderate pain or severe pain.   Yes [provider]  vitamin C (ASCORBIC ACID) 500 MG tablet Take 500 mg by mouth daily.   Yes [provider]  vitamin E 180 MG (400 UNITS) capsule Take 400 Units by mouth daily.   Yes [provider]  Zinc 50 MG TABS Take 50 mg by mouth every other day.   Yes [provider]

## 2021-01-23 NOTE — Progress Notes (Signed)
VASCULAR LAB    Bilateral lower extremity venous duplex has been performed.  See CV proc for preliminary results.   Corabelle Spackman, RVT 01/23/2021, 1:09 PM

## 2021-01-23 NOTE — Progress Notes (Signed)
NEW ADMISSION NOTE New Admission Note:   Arrival Method: Stretcher Mental Orientation: A & O x4 Telemetry: n/a Assessment: Completed Skin: See flow sheet IV: L hand Pain: 0/10 Tubes: n/a Safety Measures: Safety Fall Prevention Plan has been given, discussed and signed Admission: Completed [facility] Orientation: Patient has been orientated to the room, unit and staff.  Family: none present  Orders have been reviewed and implemented. Will continue to monitor the patient. Call light has been placed within reach and bed alarm has been activated.   Mikki Santee, RN

## 2021-01-23 NOTE — Evaluation (Signed)
Clinical/Bedside Swallow Evaluation Patient Details  Name: Randy Greene MRN: [id number] Date of Birth: Oct 24, 1965  Today's Date: 01/23/2021 Time: SLP Start Time (ACUTE ONLY): 96 SLP Stop Time (ACUTE ONLY): 1553 SLP Time Calculation (min) (ACUTE ONLY): 23 min  Past Medical History:  Past Medical History:  Diagnosis Date   Acid reflux    Arthritis    Aspiration pneumonia ([place])    Asthma    Delayed wound healing    left knee   Dyspnea    Enlarged prostate    Hypercapnic respiratory failure, chronic ([facility])    Hypertension    MS (multiple sclerosis) ([place])    Past Surgical History:  Past Surgical History:  Procedure Laterality Date   ARTHROSCOPIC     BRONCHIAL WASHINGS  01/14/2021   Procedure: BRONCHIAL WASHINGS;  Surgeon: Candee Furbish, MD;  Location: [facility] ENDOSCOPY;  Service: Pulmonary;;   HERNIA REPAIR     INCISION AND DRAINAGE Left 01/04/2017   Procedure: INCISION AND DRAINAGE LEFT KNEE;  Surgeon: Rod Can, MD;  Location: [place];  Service: Orthopedics;  Laterality: Left;   jawsurgery     KNEE ARTHROPLASTY Left 08/10/2016   Procedure: LEFT TOTAL KNEE ARTHROPLASTY WITH COMPUTER NAVIGATION;  Surgeon: Rod Can, MD;  Location: [place];  Service: Orthopedics;  Laterality: Left;   knees scope     VIDEO BRONCHOSCOPY Bilateral 01/14/2021   Procedure: VIDEO BRONCHOSCOPY WITHOUT FLUORO;  Surgeon: Candee Furbish, MD;  Location: [facility] ENDOSCOPY;  Service: Pulmonary;  Laterality: Bilateral;   HPI:  Pt is a 55 y.o. male who presented with worsening SOB breath/increasing O2 requirements and admitted for acute hypoxic respiratory failure. Chronic aspiration and dilantin pneumonitis in PCCM's differential but less c/w clinical hx. SLP consulted with request for MBS to rule out chronic aspiration syndrome. PMH: recent COVID infection (June 2022), OSA on CPAP, asthma, recent nonspecific interstitial pneumonia (admitted 7/20-7/28; bronch r/o concomittant infection neg 7/26), multiple sclerosis,  GERD, and trigeminal neuralgia. BSE 03/06/14:  adequate airway protection, though frequent belching and occasional throat clear indicative of previously diagnosed reflux. No sign of an acutely worsened function this admission. A regular texture diet and thin liquids recommended at that time. Per EMR, including Care Everywhere, pt has has multiple dysphagia assessments including endoscopy, barium swallow, pH study, esophageal manometry, and modified barium swallow. Pt was evaluated by a surgeon for a possible nissen fundoplication, but pt declined this procedure. Pt evaluated by GI at [facility] on 01/04/17 due to aspiration and reflux after 7 hospitalizations that year for pneumonia, but records were incomplete and some recommendations were deferred. Pt was advised of dietary and lifestyle modifications at that time.   Assessment / Plan / Recommendation Clinical Impression  Pt was seen for bedside swallow evaluation. He denied symptoms of oropharyngeal dysphagia, but reported UES/LES dysfunction and heartburn which he has managed with behavioral modifications. Oral mechanism exam was WNL and he presented with adequate, natural dentition. He demonstrated a single instance of throat clearing with thin liquids, as was noted in 2015, but no other symptoms were noted. Considering pt's ongoing respiratory difficulty, his history, possible subtle signs of laryngeal invasion, and PCCM's concerns, a modified barium swallow study will be conducted to further assess physiology. It cannot be completed today due to radiology's schedule, but it will be planned for 8/5. Pt's current diet will be continued until it is completed.  SLP Visit Diagnosis: Dysphagia, unspecified (R13.10)    Aspiration Risk  Mild aspiration risk    Diet Recommendation Regular;Thin liquid (  continue current diet until MBS is completed)   Liquid Administration via: Cup;Straw Medication Administration: Whole meds with liquid Supervision: Patient able to  self feed Compensations: Slow rate;Small sips/bites Postural Changes: Seated upright at 90 degrees    Other  Recommendations Oral Care Recommendations: Oral care BID   Follow up Recommendations  (TBD)      Frequency and Duration min 2x/week  2 weeks       Prognosis Prognosis for Safe Diet Advancement: Good Barriers to Reach Goals: Time post onset      Swallow Study   General Date of Onset: 01/22/21 HPI: Pt is a 55 y.o. male who presented with worsening SOB breath/increasing O2 requirements and admitted for acute hypoxic respiratory failure. Chronic aspiration and dilantin pneumonitis in PCCM's differential but less c/w clinical hx. SLP consulted with request for MBS to rule out chronic aspiration syndrome. PMH: recent COVID infection (June 2022), OSA on CPAP, asthma, recent nonspecific interstitial pneumonia (admitted 7/20-7/28; bronch r/o concomittant infection neg 7/26), multiple sclerosis, GERD, and trigeminal neuralgia. BSE 03/06/14:  adequate airway protection, though frequent belching and occasional throat clear indicative of previously diagnosed reflux. No sign of an acutely worsened function this admission. A regular texture diet and thin liquids recommended at that time. Per EMR, including Care Everywhere, pt has has multiple dysphagia assessments including endoscopy, barium swallow, pH study, esophageal manometry, and modified barium swallow. Pt was evaluated by a surgeon for a possible nissen fundoplication, but pt declined this procedure. Pt evaluated by GI at [facility] on 01/04/17 due to aspiration and reflux after 7 hospitalizations that year for pneumonia, but records were incomplete and some recommendations were deferred. Pt was advised of dietary and lifestyle modifications at that time. Type of Study: Bedside Swallow Evaluation Previous Swallow Assessment: none Diet Prior to this Study: Regular;Thin liquids Temperature Spikes Noted: No Respiratory Status: Nasal cannula History of  Recent Intubation: No Behavior/Cognition: Alert;Cooperative;Pleasant mood Oral Cavity Assessment: Within Functional Limits Oral Care Completed by SLP: No Oral Cavity - Dentition: Adequate natural dentition Vision: Functional for self-feeding Self-Feeding Abilities: Able to feed self Patient Positioning: Upright in bed;Postural control adequate for testing Baseline Vocal Quality: Normal Volitional Cough: Strong Volitional Swallow: Able to elicit    Oral/Motor/Sensory Function Overall Oral Motor/Sensory Function: Within functional limits   Ice Chips Ice chips: Within functional limits Presentation: Spoon   Thin Liquid Thin Liquid: Impaired Presentation: Straw Pharyngeal  Phase Impairments: Throat Clearing - Immediate (once with consecutive swallows but could not be replicated)    Nectar Thick Nectar Thick Liquid: Not tested   Honey Thick Honey Thick Liquid: Not tested   Puree Puree: Within functional limits Presentation: Spoon;Self Fed   Solid     Solid: Within functional limits Presentation: Self Fed     Joury Allcorn I. Hardin Negus, [place], [place] Office number [phone] Pager [place] 01/23/2021,4:24 PM

## 2021-01-23 NOTE — Progress Notes (Addendum)
HD#1 SUBJECTIVE:  Patient Summary: Randy Greene is a 55 y.o. with a pertinent PMH of recent COVID infection (June 2022), OSA on CPAP, asthma, recent nonspecific interstitial pneumonia, multiple sclerosis, GERD, and trigeminal neuralgia who presented with worsening SOB breath/incresaing O2 requirements and admitted for acute hypoxic respiratory failure.   Overnight Events: No acute events overnight.   Interim History: This is hospital day 1 for Randy Greene, who was seen and evaluated at the bedside this morning. Patient reports that he was sent in by his PCP due to an increase in shortness of breath. After discharge on 7/28, the patient notes that he was initially feeling better, but started to have an increase in dyspnea on exertion and hypoxia starting 3 days ago despite using 5L O2 nasal cannula. Upon presentation to the ED, he was started on 10L supplemental O2 via facemask and is currently using 6L O2 via nasal cannula at the time of examination. Patient states that this morning he does not have any shortness of breath at rest, but feels that he would get short of breath if he were to exert himself. He does have an occasional cough with sputum production while receiving breathing treatments, but does not have this at rest.  OBJECTIVE:  Vital Signs: Vitals:   01/23/21 0730 01/23/21 0738 01/23/21 0746 01/23/21 0814  BP: 122/84   123/79  Pulse: 66   95  Resp: (!) 23   18  Temp:   (!) 97.5 F (36.4 C) 97.6 F (36.4 C)  TempSrc:   Oral Oral  SpO2: 100% 100%  94%  Weight:      Height:       Supplemental O2: Nasal Cannula SpO2: 94 % O2 Flow Rate (L/min): 6 L/min FiO2 (%): (!) 5 %  Filed Weights   01/22/21 1428  Weight: 88 kg    Intake/Output Summary (Last 24 hours) at 01/23/2021 1429 Last data filed at 01/23/2021 1340 Gross per 24 hour  Intake 1000 ml  Output --  Net 1000 ml   Net IO Since Admission: 1,000 mL [01/23/21 1429]  Physical Exam: General: Pleasant male laying in bed,  not acutely ill appearing. No acute distress. CV: RRR. No murmurs, rubs, or gallops. No LE edema, appears euvolemic. No JVD Pulmonary: Dry crackles at bilateral lung bases. No wheezing. Remains on 5-6L O2 via nasal cannula. Abdominal: Soft, nontender, nondistended. Normal bowel sounds. Extremities: Palpable radial and DP pulses. Normal ROM. Skin: Warm and dry. No obvious rash or lesions. Neuro: A&Ox3. Moves all extremities. Normal sensation. No focal deficit. Psych: Normal mood and affect   Patient Lines/Drains/Airways Status     Active Line/Drains/Airways     Name Placement date Placement time Site Days   Peripheral IV 01/22/21 20 G Left Hand 01/22/21  1430  Hand  1   Airway 8.5 mm 01/14/21  0809  -- 9   Incision (Closed) 08/10/16 Knee Left 08/10/16  1317  -- 1627   Incision (Closed) 01/04/17 Knee Left 01/04/17  1523  -- 1480            Pertinent Labs: CBC Latest Ref Rng & Units 01/23/2021 01/22/2021 01/16/2021  WBC 4.0 - 10.5 K/uL 7.0 8.5 12.2(H)  Hemoglobin 13.0 - 17.0 g/dL 10.4(L) 10.5(L) 11.4(L)  Hematocrit 39.0 - 52.0 % 32.3(L) 32.8(L) 33.9(L)  Platelets 150 - 400 K/uL 369 367 406(H)    CMP Latest Ref Rng & Units 01/23/2021 01/22/2021 01/16/2021  Glucose 70 - 99 mg/dL 171(H) 119(H) 90  BUN 6 -  20 mg/dL _0 Creatinine 0.61 - 1.24 mg/dL 0.61 0.85 0.72  Sodium 135 - 145 mmol/L 134(L) 131(L) 132(L)  Potassium 3.5 - 5.1 mmol/L 3.6 4.2 3.8  Chloride 98 - 111 mmol/L 100 99 94(L)  CO2 22 - 32 mmol/L 24 20(L) 25  Calcium 8.9 - 10.3 mg/dL 9.1 8.3(L) 8.6(L)  Total Protein 6.5 - 8.1 g/dL 6.1(L) 6.1(L) -  Total Bilirubin 0.3 - 1.2 mg/dL 0.4 0.6 -  Alkaline Phos 38 - 126 U/L 181(H) 170(H) -  AST 15 - 41 U/L 123(H) 93(H) -  ALT 0 - 44 U/L 182(H) 158(H) -    No results for input(s): GLUCAP in the last 72 hours.     ASSESSMENT/PLAN:  Assessment: Principal Problem:   Acute on chronic respiratory failure with hypoxia (HCC) Active Problems:   MS (multiple sclerosis) (HCC)    OSA on CPAP   NSIP (nonspecific interstitial pneumonitis) (HCC)   Plan: #Acute on chronic hypoxic respiratory failure due to post COVID NSIP Patient was recently discharged from the hospital following a diagnosis of cryptogenic organizing pneumonia. He presented following an increase in dyspnea on exertion and hypoxia despite using 5L supplemental O2 via nasal cannula. He does not have fever, cough, or leukocytosis, making a viral or bacterial pneumonia less likely at this time. BAL at the last admission was performed but did not show any infectious etiology for his symptoms. Patient has also had 15 lb weight loss in the last 2 months and a loss of appetite. His modified Wells score <4 on presentation with d-dimer 1.66. CXR noted to have bilateral patchy heterogeneous opacities with increased opacities of the upper and lower left lung and improved right lower lung opacity, consistent with organizing pneumonia. Lower extremity duplex with no evidence of DVT bilaterally. - Pulmonology consulted, appreciate recommendations  - IV solumedrol 48m BID  - Diuresis with lasix 458mIV x2 doses  - Bactrim 3 times weekly for PJP prophylaxis -Albuterol every 6 hours, Singulair once daily - Blood cultures pending - Follow up legionella UA, Strep pneumo UA, and expectorated sputum w/gram stain - Modified barium swallow to rule out chronic aspiration  #Hyponatremia Patient has hyponatremia which appears to be chronic, sodium 131 upon presentation. Sodium level improved to 134 after 1L NS fluid bolus.  - Continue to monitor BMP   #Elevated Liver Enzymes Patient presented with AST/ALT elevated at 123/182 and alkaline phosphatase elevated to 181. Total bilirubin level is normal.  - Follow up with RUQ ultrasound   #Multiple Sclerosis on biologic therapy Patient has multiple sclerosis that he states is well controlled with Ocrevus therapy every 6 months. His symptoms include optic neuritis and left leg weakness  at his baseline. Has residual weakness on left side and paresthesias, which remains stable. - Continue home dronabinol 2.5 mg, baclofen 10 mg 4x daily - Metoclopramide 10 mg daily  #Trigeminal Neuralgia Patient has trigeminal neuralgia secondary to his Multiple Sclerosis.  - Continue home phenytoin 100 BID, pregabalin 150 QHS - Tramadol 50 Q6 PRN  #Unintentional weight loss Patient reports an unintentional weight loss of 15 lbs over the past 2 months, ever since being diagnosed with COVID. Patient has had poor oral intake recently, although chronic aspiration causing food aversion must be ruled out. - Swallow study ordered to rule out chronic aspiration syndrome    #GERD Patient has history of GERD. Takes nexium 4086mID and pepcid 81m34mily. - Continue home medications   #Hypertension Patient has hypertension, however, was  hypotensive upon presentation. Patient was previously on Cardura 4 mg but it was held during last admission in the setting of hypotension. Currently not taking any medications for his BP and it has remained stable with SBP in the 120s. Improved BP could be secondary to the patient's recent weight loss. - Continue to monitor   #Hyperlipidemia - Continue home simvastatin and aspirin   #BPH Patient has BPH that is controlled and denies any urinary symptoms this time. - Continue home alfuzosin 10 mg and finasteride 5 mg     #OSA on CPAP Patient has OSA on CPAP at home. He has not been using his CPAP regularly at home as he has been only using his supplemental O2 via nasal cannula. -Continue CPAP nightly   #Asthma Patient has history of asthma and is on albuterol, singulair, and advair at home. - Continue home albuterol, breo ellipta, and singulair   Best Practice: Diet: Thin fluids IVF: Fluids: none VTE: enoxaparin (LOVENOX) injection 40 mg Start: 01/22/21 1845 Code: Full Therapy Recs: PT/OT eval and treat DISPO: Anticipated discharge to home pending   clinical improvement .  Signature: Buddy Duty, D.O.  Internal Medicine Resident, PGY-1 [facility]  Pager: [phone] 2:29 PM, 01/23/2021   Please contact the on call pager after 5 pm and on weekends at [phone].

## 2021-01-23 NOTE — Evaluation (Signed)
Physical Therapy Evaluation Patient Details Name: Randy Greene MRN: [id number] DOB: Nov 22, 1965 Today's Date: 01/23/2021   History of Present Illness  The pt is a 55 yo male presenting 8/3 with SOB x 2days and found to be hypotensive and tachycardic at PCP visit. The pt had reccently been d/c (7/28) after being hospitalized for sepsis and PNA, and has been on 5-6L O2 since d/c. PMH includes: MS, OSA on CPAP, HTN, and arthritis.   Clinical Impression  Pt in bed upon arrival of PT, agreeable to evaluation at this time. Prior to admission the pt was mobilizing with use of a rollator and use of 5-6L supplemental O2 for mobility within his home, but reports increased difficulty following recent d/c on 7/28. The pt now presents with limitations in functional mobility and endurance due to above dx, and will continue to benefit from skilled PT to address these deficits. The pt was able to demo good strength and mobility for bed mobility and initial sit-stand transfers. However, even repeated sit-stand transfers was a sufficient challenge to cause drop in SpO2 from 90s to mid 80s (low of 83%) on 6L. The pt was able to recover to 90s after 3-4 min of seated rest, and demos good understanding of monitoring and self-regulating. The pt was able to complete short bout of hallway ambulation with good stability, but again hd marked drop in SpO2 following exertion requiring seated rest and 8L O2 to recover. The pt is motivated to return home, has set up good social support, and was encouraged to mobilize frequently in short bouts during this stay. The pt asked for SpO2 monitor so that he is better able to monitor activity and self-regulate rest/activity. RN was alerted of this request.      Follow Up Recommendations Home health PT;Supervision for mobility/OOB    Equipment Recommendations  None recommended by PT    Recommendations for Other Services       Precautions / Restrictions Precautions Precautions: Other  (comment) Precaution Comments: watch SpO2 Restrictions Weight Bearing Restrictions: No      Mobility  Bed Mobility Overal bed mobility: Independent                  Transfers Overall transfer level: Needs assistance Equipment used: None;Rolling walker (2 wheeled) Transfers: Sit to/from Stand Sit to Stand: Supervision         General transfer comment: sit-stand with and without RW, pt is stable with initial stand without need for UE support. 5xsts in 13 sec with no RW  Ambulation/Gait Ambulation/Gait assistance: Supervision Gait Distance (Feet): 75 Feet ((x2 35 ft bouts)) Assistive device: Rolling walker (2 wheeled) Gait Pattern/deviations: WFL(Within Functional Limits) Gait velocity: WFL Gait velocity interpretation: 1.31 - 2.62 ft/sec, indicative of limited community ambulator General Gait Details: steady. SpO2 and SOB are biggest challenges. pt maintained 88-90% initially on 6L, dropped to low 80s with continued walking, needing 8L to maintain in 80s with return to room. pt reporting mild SOB, not in line with SpO2 80-83%. Upon returning to seated position, SpO2 continued to drop to 70s, pt needing 5 min to recover on 8L      Balance Overall balance assessment: Modified Independent                                           Pertinent Vitals/Pain Pain Assessment: No/denies pain    Home Living Family/patient  expects to be discharged to:: Private residence Living Arrangements: Alone Available Help at Discharge: Family;Available PRN/intermittently Type of Home: Apartment Home Access: Level entry     Home Layout: One level Home Equipment: Walker - 4 wheels;Cane - single point;Tub bench;Electric scooter;Other (comment);Shower seat - built in      Prior Function Level of Independence: Independent with assistive device(s)         Comments: using SPC in the home, 4WW outside and electric scooter for community distances.     Hand  Dominance   Dominant Hand: Right    Extremity/Trunk Assessment   Upper Extremity Assessment Upper Extremity Assessment: Overall WFL for tasks assessed    Lower Extremity Assessment Lower Extremity Assessment: Overall WFL for tasks assessed LLE Deficits / Details: weaker than rt due to MS    Cervical / Trunk Assessment Cervical / Trunk Assessment: Normal  Communication   Communication: No difficulties  Cognition Arousal/Alertness: Awake/alert Behavior During Therapy: WFL for tasks assessed/performed Overall Cognitive Status: Within Functional Limits for tasks assessed                                        General Comments General comments (skin integrity, edema, etc.): pt maintained 88-90% initially on 6L, dropped to low 80s with continued walking, needing 8L to maintain in 80s with return to room. pt reporting mild SOB, not in line with SpO2 80-83%. Upon returning to seated position, SpO2 continued to drop to 70s, pt needing 5 min to recover on 8L    Exercises                  PT Goals (Current goals can be found in the Care Plan section)  Acute Rehab PT Goals Patient Stated Goal: to reduce O2 need PT Goal Formulation: With patient Time For Goal Achievement: 02/06/21 Potential to Achieve Goals: Fair    Frequency Min 3X/week   Barriers to discharge           AM-PAC PT "6 Clicks" Mobility  Outcome Measure Help needed turning from your back to your side while in a flat bed without using bedrails?: None Help needed moving from lying on your back to sitting on the side of a flat bed without using bedrails?: None Help needed moving to and from a bed to a chair (including a wheelchair)?: A Little Help needed standing up from a chair using your arms (e.g., wheelchair or bedside chair)?: A Little Help needed to walk in hospital room?: A Little Help needed climbing 3-5 steps with a railing? : A Lot 6 Click Score: 19    End of Session Equipment  Utilized During Treatment: Oxygen;Gait belt Activity Tolerance: Treatment limited secondary to medical complications (Comment) Patient left: in bed;with call bell/phone within reach Nurse Communication: Mobility status PT Visit Diagnosis: Difficulty in walking, not elsewhere classified (R26.2)    Time: 7412-8786 PT Time Calculation (min) (ACUTE ONLY): 22 min   Charges:   PT Evaluation $PT Eval Low Complexity: 1 Low          Inocencio Homes, PT, DPT   Acute Rehabilitation Department Pager #: [phone]  Otho Bellows 01/23/2021, 6:16 PM

## 2021-01-23 NOTE — ED Notes (Signed)
Attempted to give reportx1 

## 2021-01-24 ENCOUNTER — Inpatient Hospital Stay (HOSPITAL_COMMUNITY): Payer: No Typology Code available for payment source

## 2021-01-24 ENCOUNTER — Other Ambulatory Visit: Payer: No Typology Code available for payment source

## 2021-01-24 DIAGNOSIS — J9621 Acute and chronic respiratory failure with hypoxia: Secondary | ICD-10-CM | POA: Diagnosis not present

## 2021-01-24 DIAGNOSIS — I5030 Unspecified diastolic (congestive) heart failure: Secondary | ICD-10-CM

## 2021-01-24 LAB — ECHOCARDIOGRAM COMPLETE
Height: 74 in
S' Lateral: 3.5 cm
Weight: 3104.08 oz

## 2021-01-24 LAB — COMPREHENSIVE METABOLIC PANEL
ALT: 225 U/L — ABNORMAL HIGH (ref 0–44)
AST: 136 U/L — ABNORMAL HIGH (ref 15–41)
Albumin: 2.4 g/dL — ABNORMAL LOW (ref 3.5–5.0)
Alkaline Phosphatase: 192 U/L — ABNORMAL HIGH (ref 38–126)
Anion gap: 11 (ref 5–15)
BUN: 8 mg/dL (ref 6–20)
CO2: 26 mmol/L (ref 22–32)
Calcium: 8.9 mg/dL (ref 8.9–10.3)
Chloride: 100 mmol/L (ref 98–111)
Creatinine, Ser: 0.74 mg/dL (ref 0.61–1.24)
GFR, Estimated: >60 mL/min (ref >60)
Glucose, Bld: 120 mg/dL — ABNORMAL HIGH (ref 70–99)
Potassium: 5.2 mmol/L — ABNORMAL HIGH (ref 3.5–5.1)
Sodium: 137 mmol/L (ref 135–145)
Total Bilirubin: 0.6 mg/dL (ref 0.3–1.2)
Total Protein: 6.7 g/dL (ref 6.5–8.1)

## 2021-01-24 LAB — GAMMA GT: GGT: 421 U/L — ABNORMAL HIGH (ref 7–50)

## 2021-01-24 LAB — STREP PNEUMONIAE URINARY ANTIGEN: Strep Pneumo Urinary Antigen: NEGATIVE

## 2021-01-24 MED ORDER — ENSURE ENLIVE PO LIQD: 237.0000 mL | Freq: Two times a day (BID) | ORAL | Status: DC

## 2021-01-24 MED ORDER — ADULT MULTIVITAMIN W/MINERALS CH
1.0000 | ORAL_TABLET | Freq: Every day | ORAL | Status: DC
  Administered 2021-01-24 – 2021-01-27 (×4): 1 via ORAL
  Filled 2021-01-24 (×4): qty 1

## 2021-01-24 NOTE — Progress Notes (Signed)
  Speech Language Pathology Treatment: Dysphagia  Patient Details Name: Randy Greene MRN: [id number] DOB: 04/23/1966 Today's Date: 01/24/2021 Time: 5701-7793 SLP Time Calculation (min) (ACUTE ONLY): 24 min  Assessment / Plan / Recommendation Clinical Impression  Pt was seen for dysphagia treatment. He was educated regarding the results of the modified barium swallow study, diet recommendations, and swallowing precautions. Video recording of the study was used to facilitate education; pt verbalized understanding regarding all areas of education and was able to demonstrate understanding using teach back. Pt demonstrated use of the chin tuck and left head turn with intermittent cues. Pt was noted to drink twice while reclined and was responsive to verbal prompts for repositioning. A regular texture diet with thin liquids is recommended with strict observance of swallowing precautions and intermittent supervision to ensure consistency. Pt was educated regarding the purpose and demonstration of dysphagia exercises and he verbalized understanding. He completed the [facility] with cues to maintain lingual protrusion and with self-correction. He completed three reps of the isometric portion of the Shaker and maintained head elevation for 60 seconds per rep. The isokinetic portion was deferred due to pt's reported reflux symptoms. Pt was advised of SLP's recommendation for outpatient SLP services after discharge and he verbalized agreement. SLP will continue to follow pt.    HPI HPI: Pt is a 55 y.o. male who presented with worsening SOB breath/increasing O2 requirements and admitted for acute hypoxic respiratory failure. Chronic aspiration and dilantin pneumonitis in PCCM's differential but less c/w clinical hx. SLP consulted with request for MBS to rule out chronic aspiration syndrome. PMH: recent COVID infection (June 2022), OSA on CPAP, asthma, recent nonspecific interstitial pneumonia (admitted 7/20-7/28; bronch r/o  concomittant infection neg 7/26), multiple sclerosis, GERD, and trigeminal neuralgia. BSE 03/06/14:  adequate airway protection, though frequent belching and occasional throat clear indicative of previously diagnosed reflux. No sign of an acutely worsened function this admission. A regular texture diet and thin liquids recommended at that time. Per EMR, including Care Everywhere, pt has has multiple dysphagia assessments including endoscopy, barium swallow, pH study, esophageal manometry, and modified barium swallow. Pt was evaluated by a surgeon for a possible nissen fundoplication, but pt declined this procedure. Pt evaluated by GI at [facility] on 01/04/17 due to aspiration and reflux after 7 hospitalizations that year for pneumonia, but records were incomplete and some recommendations were deferred. Pt was advised of dietary and lifestyle modifications at that time.      SLP Plan  Continue with current plan of care       Recommendations  Diet recommendations: Regular;Thin liquid Liquids provided via: Cup;Straw Medication Administration: Whole meds with liquid Supervision: Patient able to self feed Compensations: Slow rate;Small sips/bites;Effortful swallow;Chin tuck (head turn to left) Postural Changes and/or Swallow Maneuvers: Seated upright 90 degrees;Chin tuck;Head turn left during swallow                Follow up Recommendations: Outpatient SLP SLP Visit Diagnosis: Dysphagia, unspecified (R13.10) Plan: Continue with current plan of care       Randy Greene I. Hardin Negus, [place], [place] Office number [phone] Pager [phone]                Randy Greene 01/24/2021, 12:05 PM

## 2021-01-24 NOTE — Progress Notes (Signed)
Modified Barium Swallow Progress Note  Patient Details  Name: Randy Greene MRN: [id number] Date of Birth: 08/07/1965  Today's Date: 01/24/2021  Modified Barium Swallow completed.  Full report located under Chart Review in the Imaging Section.  Brief recommendations include the following:  Clinical Impression  Pt presented with pharyngeal dysphagia characterized by reduced lingual retraction, reduced anterior laryngeal movement, and reduced hyolaryngeal elevation. He consistently demonstrated mild vallecular residue and pyriform sinus residue. Pyriform sinus residue was at least partly due to reduced duration of cricopharyngeal relaxation. No instances of penetration/aspiration were not noted with solids or liquids. However, considering the frequency of pyriform sinus residue with thin liquids and pt's history, SLP questions intermittent aspiration of residue after deglutition especially if positioning is suboptimal. Residue was improved to a functional level with use of an effortful swallow and chin tuck posture with left head turn. Esophageal screening revealed adequate esophageal clearance. Pt's current diet of regular texture solids and thin liquids will be continued with consistent observance of swallowing precautions. SLP will follow for dysphagia treatment.   Swallow Evaluation Recommendations       SLP Diet Recommendations: Regular solids;Thin liquid   Liquid Administration via: Cup;Straw   Medication Administration: Whole meds with liquid   Supervision: Intermittent supervision to cue for compensatory strategies   Compensations: Slow rate;Small sips/bites;Effortful swallow;Chin tuck (head turn to left)   Postural Changes: Seated upright at 90 degrees   Oral Care Recommendations: Oral care BID     Dequavius Kuhner I. Hardin Negus, [place], [place] Office number [phone] Pager [phone]    Horton Marshall 01/24/2021,9:36 AM

## 2021-01-24 NOTE — Progress Notes (Signed)
Initial Nutrition Assessment  DOCUMENTATION CODES:   Not applicable  INTERVENTION:  -Ensure Enlive po BID, each supplement provides 350 kcal and 20 grams of protein -MVI with minerals daily  NUTRITION DIAGNOSIS:   Inadequate oral intake related to acute illness (COVID) as evidenced by per patient/family report.  GOAL:   Patient will meet greater than or equal to 90% of their needs  MONITOR:   PO intake, Supplement acceptance, Labs, Weight trends, I & O's  REASON FOR ASSESSMENT:   Malnutrition Screening Tool    ASSESSMENT:   Pt with a PMH significant for recent COVID infection (June 2022), OSA on CPAP, asthma, recent nonspecific interstitial PNA, MS, GERD, and trigeminal neuralgia admitted with acute hypoxic respiratory failure 2/2 post COVID NSIP.  Pt unavailable at time of RD visit x2 attempts. Discussed pt with RN. Pt had MBS today which showed mild risk for aspiration. Per H&P, pt has had poor po intake and 15 lb weight loss over the last two months since he had COVID. Limited meal completions available, but it is documented pt consumed 100% of breakfast this morning. RN confirmed pt has had good intake since admit.   Available weight readings demonstrate clinically significant 13.6% weight loss over 2 month period. Pt likely meets criteria for malnutrition; however, unable to diagnose at this time without nutrition-focused physical exam.   Medications: marinol, pepcid, solu-medrol, reglan, protonix Labs: K+ 5.2 (H), LFTs elevated   UOP: 1.6L x24 hours I/O: +631ml since admit  Diet Order:   Diet Order             Diet regular Room service appropriate? Yes; Fluid consistency: Thin  Diet effective now                   EDUCATION NEEDS:   No education needs have been identified at this time  Skin:  Skin Assessment: Reviewed RN Assessment  Last BM:  8/3  Height:   Ht Readings from Last 1 Encounters:  01/22/21 6\' 2"  (1.88 m)    Weight:   Wt Readings  from Last 1 Encounters:  01/23/21 88 kg    BMI:  Body mass index is 24.91 kg/m.  Estimated Nutritional Needs:   Kcal:  2200-2400  Protein:  110-120 grams  Fluid:  >2.2L/    Larkin Ina, MS, RD, LDN (she/her/hers) RD pager number and weekend/on-call pager number located in [place].

## 2021-01-24 NOTE — Hospital Course (Addendum)
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 He presented following an increase in dyspnea on exertion and hypoxia despite using 5L supplemental O2 via nasal cannula. He does not have fever, cough, or leukocytosis, making a viral or bacterial pneumonia less likely at this time. BAL at the last admission was performed but did not show any infectious etiology for his symptoms. Patient has also had 15 lb weight loss in the last 2 months and a loss of appetite. His modified Wells score <4 on presentation with d-dimer 1.66. CXR noted to have bilateral patchy heterogeneous opacities with increased opacities of the upper and lower left lung and improved right lower lung opacity, consistent with organizing pneumonia. Lower extremity duplex with no evidence of DVT bilaterally. Patient remains on 5-6L of O2 via nasal cannula on 8/5 and continued to desaturate with movements or exertion. He denies any dysphagia, coughing while eating, or any feeling that food is getting stuck in his throat. Modified barium swallow study performed and show that the patient is at a mild risk for aspiration. Blood cultures with no growth at 2 days and strep pneumoniae urinary antigen also negative. Patient became febrile overnight on 8/5, Tmax 102. The patient was very diaphoretic, but denied an increase in his shortness of breath, chest pain, palpitations, or any other sxs. Repeat CXR performed and was unchanged compared to prior. Repeat blood cultures also ordered. Patient received a dose of vanc/zosyn at this time, however, these were discontinued and unasyn was started for possible aspiration pneumonia coverage. On 8/7 the patient noted that he was able to walk the halls with PT and his SpO2 got down to 88%, which is improved compared to prior days. Patient remained afebrile x2  nights and upon the day of discharge denied shortness of breath, chest pain, palpitations, or any other symptoms. Unasyn discontinued. He will be discharged on oral steroids and will follow up with pulmonology in 1 week.   #Hyponatremia, resolved Patient has hyponatremia which appears to be chronic, sodium 131 upon presentation. Sodium level improved to 134 after 1L NS fluid bolus. Remained stable over the course of his admission.   #Elevated Liver Enzymes Patient presented with AST/ALT elevated at 123/182 and alkaline phosphatase elevated to 181. Total bilirubin level is normal. RUQ ultrasound performed in the setting of elevated liver enzymes and was unremarkable. GGT elevated at 421 and liver doppler significant for lower measured velocities in the portal vein that may be consistent with some degree of portal hypertension. LFTs improved on 8/6. Will consider outpatient GI follow up for further evaluation.   #Multiple Sclerosis on biologic therapy Patient has multiple sclerosis that he states is well controlled with Ocrevus therapy every 6 months. His symptoms include optic neuritis and left leg weakness at his baseline. Has residual weakness on left side and paresthesias, which remains stable.  #Trigeminal Neuralgia Patient has trigeminal neuralgia secondary to his Multiple Sclerosis. Continued home phenytoin 100 BID, pregabalin 150 QHS and added Tramadol 50 Q6 PRN   #Unintentional weight loss Patient reports an unintentional weight loss of 15 lbs over the past 2 months, ever since being diagnosed with COVID. Patient has had poor oral intake recently, although chronic aspiration causing food aversion must be ruled out. Modified barium swallow showing that patient is at a mild risk for aspiration. Received a short course of unasyn for possible aspiration pneumonia coverage.   #  GERD Patient has history of GERD. Takes nexium 64m BID and pepcid 418mdaily. He was started on Protonix here, as we do  not have Nexium on formulary, and this has not helped his heartburn. Patient had someone bring in home Nexium for his symptoms which improved his heartburn.    #Hypertension Patient has hypertension, however, was hypotensive upon presentation. Patient was previously on Cardura 4 mg but it was held during last admission in the setting of hypotension. Currently not taking any medications for his BP and it has remained stable with SBP in the 120s. Improved BP could be secondary to the patient's recent weight loss.   #Hyperlipidemia Continued home simvastatin and aspirin   #BPH Patient has BPH that is controlled and denies any urinary symptoms this time. Continued home alfuzosin 10 mg and finasteride 5 mg     #OSA on CPAP Patient has OSA on CPAP at home. He has not been using his CPAP regularly at home as he has been only using his supplemental O2 via nasal cannula. He was able to wear the cpap at night on 8/6 for a few hours, however, he did not get a good seal and then switched to the nasal cannula. Did not wear the CPAP other than then.    #Asthma Patient has history of asthma and is on albuterol, singulair, and advair at home. Continue home albuterol, breo ellipta, and singulair

## 2021-01-24 NOTE — Progress Notes (Signed)
RT note: RT refusing to wear cpap tonight.

## 2021-01-24 NOTE — Progress Notes (Signed)
NAME:  Randy Greene, MRN:  [id number], DOB:  03/15/1966, LOS: 1 ADMISSION DATE:  01/22/2021, CONSULTATION DATE:  01/24/21  REFERRING MD: IM TS, CHIEF COMPLAINT: Chest pain, dyspnea  History of Present Illness:  55 year old man known to practice. History of MS on ocrelizumab q57mo asthma, had COVID and post-COVID NSIP admitted 7/20-7/28 Bronch r/o concomittant infection neg 7/26 Rheum workup neg Trial of steroids with some improvement Went home then started feeling bad again with worsening DOE Remains on 5-6L at rest, desats with any movement. Denies aspiration symptoms but has had in past PCCM asked to see what else can be done No sick contacts, no fevers, occasional productive cough with MDI use   Pertinent  Medical History  MS on immunosuppression drug Diastolic dysfunction  Recent COVID infection in 11/2020  Significant Hospital Events: Including procedures, antibiotic start and stop dates in addition to other pertinent events   8/3 readmitted  Interim History / Subjective:  Some pharyngeal dysphagia with thin liquids if positioning suboptimal, SLP to follow. [place]DVT neg. Feeling just a little better from dyspnea standpoint when ambulating around hallway. No productive cough and not able to come up with sputum specimen.  Objective   Blood pressure 117/85, pulse 87, temperature (!) 97.3 F (36.3 C), temperature source Oral, resp. rate 18, height _0  (1.88 m), weight 88 kg, SpO2 96 %.        Intake/Output Summary (Last 24 hours) at 01/24/2021 1023 Last data filed at 01/24/2021 0518 Gross per 24 hour  Intake 960 ml  Output 1600 ml  Net -640 ml   Filed Weights   01/22/21 1428 01/23/21 2114  Weight: 88 kg 88 kg    Examination: Constitutional: no distress  Ears, nose, mouth, and throat: MM dry, trachea midline Cardiovascular: RRR, ext warm Respiratory: faint crackles BL, mildly tachypneic Gastrointestinal: soft, +BS, nontender Skin: No rashes, normal turgor Neurologic:  moves all 4 ext to command Psychiatric: excellent insight Ext: no edema, wwp  Transaminitis  Resolved Hospital Problem list     Assessment & Plan:  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 Recurrent microaspiration possible and interval retrocardiac opacity could be consistent with this. No signs of new infection.  Fibrotic changes apparent on recent CT.   Transaminitis- new, unclear cause; congestion? AED effect? MS on biologic Unintentional weight loss post covid- pulmonary cachexia and poor PO may be contributory  - IV steroids - continue current 131mkg dose - Push diuresis - Bactrim PJP ppx - Check echo, [place] doppler, ggt - No other good agents for his trigeminal neuralgia (carbamazepine caused severe hyponatremia) and low suspicion for this drug exasperating things, fine to leave as is - Reviewed lit for his ocrelizumab, no link with pneumonitis; ongoing research regarding COVID in this population. I do wonder if it could predispose to long course of covid-19 with impaired clearance. Although upper tract sampling negative for covid-19, possible he may have active lower tract infection. Patients with other B-cell depleting immunosuppression are at risk for this and severe outcomes from covid-19 and if he's unimproved over the weekend it may be worth engaging ID to see if he could benefit from trial of second course of antiviral therapy or IVIG with or without first pursuing repeat bronch/BAL to determine whether there is persistent lower tract sars cov 2 rna  presence.  [facility]   Best Practice (right click and "Reselect all SmartList Selections" daily)   Per primary  Labs   CBC: Recent Labs  Lab 01/22/21 1431 01/23/21 0320  WBC 8.5 7.0  NEUTROABS  --   5.4  HGB 10.5* 10.4*  HCT 32.8* 32.3*  MCV 87.2 86.6  PLT 367 502    Basic Metabolic Panel: Recent Labs  Lab 01/22/21 1431 01/23/21 0320 01/24/21 0447  NA 131* 134* 137  K 4.2 3.6 5.2*  CL 99 100 100  CO2 20* 24 26  GLUCOSE 119* 171* 120*  BUN _0 CREATININE 0.85 0.61 0.74  CALCIUM 8.3* 9.1 8.9   GFR: Estimated Creatinine Clearance: 119.9 mL/min (by C-G formula based on SCr of 0.74 mg/dL). Recent Labs  Lab 01/22/21 1431 01/22/21 1515 01/23/21 0320  WBC 8.5  --  7.0  LATICACIDVEN  --  1.7  --     Liver Function Tests: Recent Labs  Lab 01/22/21 1431 01/23/21 0320 01/24/21 0447  AST 93* 123* 136*  ALT 158* 182* 225*  ALKPHOS 170* 181* 192*  BILITOT 0.6 0.4 0.6  PROT 6.1* 6.1* 6.7  ALBUMIN 2.2* 2.2* 2.4*   No results for input(s): LIPASE, AMYLASE in the last 168 hours. No results for input(s): AMMONIA in the last 168 hours.  ABG    Component Value Date/Time   PHART 7.493 (H) 03/06/2014 0154   PCO2ART 34.6 (L) 03/06/2014 0154   PO2ART 57.9 (L) 03/06/2014 0154   HCO3 26.3 (H) 03/06/2014 0154   TCO2 27.3 03/06/2014 0154   O2SAT 91.0 03/06/2014 0154     Coagulation Profile: No results for input(s): INR, PROTIME in the last 168 hours.   Cardiac Enzymes: No results for input(s): CKTOTAL, CKMB, CKMBINDEX, TROPONINI in the last 168 hours.  HbA1C: No results found for: HGBA1C  CBG: No results for input(s): GLUCAP in the last 168 hours.  Review of Systems:    Positive Symptoms in bold:  Constitutional fevers, chills, weight loss, fatigue, anorexia, malaise  Eyes decreased vision, double vision, eye irritation  Ears, Nose, Mouth, Throat sore throat, trouble swallowing, sinus congestion  Cardiovascular chest pain, paroxysmal nocturnal dyspnea, lower ext edema, palpitations   Respiratory SOB, cough, DOE, hemoptysis, wheezing  Gastrointestinal nausea, vomiting, diarrhea  Genitourinary burning with urination, trouble urinating  Musculoskeletal  joint aches, joint swelling, back pain  Integumentary  rashes, skin lesions  Neurological focal weakness, focal numbness, trouble speaking, headaches  Psychiatric depression, anxiety, confusion  Endocrine polyuria, polydipsia, cold intolerance, heat intolerance  Hematologic abnormal bruising, abnormal bleeding, unexplained nose bleeds  Allergic/Immunologic recurrent infections, hives, swollen lymph nodes     Past Medical History:  He,  has a past medical history of Acid reflux, Arthritis, Aspiration pneumonia ([place]), Asthma, Delayed wound healing, Dyspnea, Enlarged prostate, Hypercapnic respiratory failure, chronic ([place]), Hypertension, and MS (multiple sclerosis) ([place]).   Surgical History:   Past Surgical History:  Procedure Laterality Date   ARTHROSCOPIC     BRONCHIAL WASHINGS  01/14/2021   Procedure: BRONCHIAL WASHINGS;  Surgeon: Candee Furbish, MD;  Location: [facility] ENDOSCOPY;  Service: Pulmonary;;   HERNIA REPAIR     INCISION AND DRAINAGE Left 01/04/2017   Procedure: INCISION AND DRAINAGE LEFT KNEE;  Surgeon: Rod Can, MD;  Location: [place];  Service: Orthopedics;  Laterality: Left;   jawsurgery     KNEE ARTHROPLASTY Left 08/10/2016   Procedure: LEFT TOTAL KNEE ARTHROPLASTY WITH COMPUTER NAVIGATION;  Surgeon: Rod Can,  MD;  Location: [facility];  Service: Orthopedics;  Laterality: Left;   knees scope     VIDEO BRONCHOSCOPY Bilateral 01/14/2021   Procedure: VIDEO BRONCHOSCOPY WITHOUT FLUORO;  Surgeon: Candee Furbish, MD;  Location: [facility] ENDOSCOPY;  Service: Pulmonary;  Laterality: Bilateral;     Social History:   reports that he quit smoking about 14 years ago. His smoking use included cigarettes. He has a 20.00 pack-year smoking history. He has never used smokeless tobacco. He reports current alcohol use. He reports that he does not use drugs.   Family History:  His family history includes Dementia in his mother; Heart attack in his father; Hypertension in his father.    Allergies Allergies  Allergen Reactions   Penicillins Hives and Other (See Comments)    [ +FAMILY HISTORY] Has patient had a PCN reaction causing immediate rash, facial/tongue/throat swelling, SOB or lightheadedness with hypotension: Unknown Has patient had a PCN reaction causing severe rash involving mucus membranes or skin necrosis: Unknown Has patient had a PCN reaction that required hospitalization: No Has patient had a PCN reaction occurring within the last 10 years: No If all of the above answers are "NO", then may proceed with Cephalosporin use.     Tape Rash   Zoloft [Sertraline] Rash   Food Nausea And Vomiting and Other (See Comments)    GREEN PEPPERS- flushed  PT DOES NOT EAT MEAT   Morphine Itching   Mushroom Extract Complex Nausea And Vomiting and Other (See Comments)    And flushed   Morphine And Related Itching   Penicillin G Rash     Home Medications  Prior to Admission medications   Medication Sig Start Date End Date Taking? Authorizing Provider  Acetaminophen-Codeine 300-30 MG tablet Take 1 tablet by mouth 2 (two) times daily as needed for pain. 08/26/20  Yes [provider]  albuterol (PROVENTIL HFA;VENTOLIN HFA) 108 (90 BASE) MCG/ACT inhaler Inhale 2 puffs into the lungs every 6 (six) hours as needed for wheezing or shortness of breath.   Yes [provider]  alfuzosin (UROXATRAL) 10 MG 24 hr tablet Take 10 mg by mouth daily with breakfast.   Yes [provider]  amLODipine (NORVASC) 10 MG tablet Take 5 mg by mouth at bedtime.   Yes [provider]  amphetamine-dextroamphetamine (ADDERALL) 30 MG tablet Take 30 mg by mouth 2 (two) times daily.   Yes [provider]  aspirin EC 81 MG tablet Take 81 mg by mouth at bedtime. Swallow whole.   Yes [provider]  baclofen (LIORESAL) 10 MG tablet Take 15 mg by mouth 4 (four) times daily.    Yes [provider]  Calcium Carbonate 500 (200 Ca) MG WAFR Take 1  tablet by mouth in the morning and at bedtime.   Yes [provider]  cetirizine (ZYRTEC) 10 MG tablet Take 10 mg by mouth daily.   Yes [provider]  Cholecalciferol (VITAMIN D) 50 MCG (2000 UT) tablet Take 2,000 Units by mouth daily.   Yes [provider]  cyanocobalamin (,VITAMIN B-12,) 1000 MCG/ML injection Inject 1,000 mcg into the muscle once.   Yes [provider]  diclofenac Sodium (VOLTAREN) 1 % GEL Apply 2 g topically 2 (two) times daily as needed (pain). 11/27/19  Yes [provider]  dronabinol (MARINOL) 2.5 MG capsule Take 7.5 mg by mouth 3 (three) times daily.    Yes [provider]  esomeprazole (NEXIUM) 40 MG capsule Take 40 mg by mouth  2 (two) times daily before a meal.   Yes [provider]  famotidine (PEPCID) 40 MG tablet Take 40 mg by mouth at bedtime.   Yes [provider]  finasteride (PROSCAR) 5 MG tablet Take 5 mg by mouth daily.   Yes [provider]  fluorouracil (EFUDEX) 5 % cream Apply 1 application topically 2 (two) times daily.   Yes [provider]  fluticasone-salmeterol (ADVAIR) 250-50 MCG/ACT AEPB Inhale 1 puff into the lungs in the morning and at bedtime.   Yes [provider]  Folic Acid-Vit X8-PJA S50 (FOLBEE) 2.5-25-1 MG TABS tablet Take 1 tablet by mouth daily.   Yes [provider]  ipratropium (ATROVENT) 0.03 % nasal spray Place 2 sprays into both nostrils 2 (two) times daily. 11/13/19  Yes [provider]  ketotifen (ZADITOR) 0.025 % ophthalmic solution Place 1 drop into both eyes 2 (two) times daily as needed (itching).   Yes [provider]  losartan (COZAAR) 100 MG tablet Take 50 mg by mouth daily.   Yes [provider]  magnesium (MAGTAB) 84 MG (7MEQ) TBCR SR tablet Take 42 mg by mouth 2 (two) times daily.   Yes [provider]  metoCLOPramide (REGLAN) 10 MG tablet Take 10 mg by mouth daily with supper.   Yes  [provider]  montelukast (SINGULAIR) 10 MG tablet Take 10 mg by mouth daily.   Yes [provider]  naloxone (NARCAN) nasal spray 4 mg/0.1 mL Place 1 spray into the nose See admin instructions. SPRAY 1 SPRAY INTO ONE NOSTRIL AS DIRECTED FOR OPIOID OVERDOSE (TURN PERSON ON SIDE AFTER DOSE. IF NO RESPONSE IN 2-3 MINUTES OR PERSON RESPONDS BUT RELAPSES, REPEAT USING A NEW SPRAY DEVICE AND SPRAY INTO THE OTHER NOSTRIL. CALL 911 AFTER USE.) * EMERGENCY USE ONLY * 01/16/20  Yes [provider]  Omega-3 Fatty Acids (FISH OIL PO) Take 1 capsule by mouth daily.   Yes [provider]  ondansetron (ZOFRAN) 4 MG tablet Take 4 mg by mouth every 8 (eight) hours as needed for nausea or vomiting.   Yes [provider]  phenytoin (DILANTIN) 100 MG ER capsule Take 100 mg by mouth 2 (two) times daily. 07/18/20  Yes [provider]  pregabalin (LYRICA) 150 MG capsule Take 150 mg by mouth See admin instructions. 161m am, 1539mnoon, and 30025mhs.   Yes [provider]  Probiotic Product (ALIGN) 4 MG CAPS Take 4 mg by mouth in the morning and at bedtime.   Yes [provider]  sildenafil (VIAGRA) 100 MG tablet Take 100 mg by mouth daily as needed for erectile dysfunction.   Yes [provider]  simvastatin (ZOCOR) 10 MG tablet Take 10 mg by mouth at bedtime.   Yes [provider]  traMADol (ULTRAM) 50 MG tablet Take 50 mg by mouth every 6 (six) hours as needed for moderate pain or severe pain.   Yes [provider]  vitamin C (ASCORBIC ACID) 500 MG tablet Take 500 mg by mouth daily.   Yes [provider]  vitamin E 180 MG (400 UNITS) capsule Take 400 Units by mouth daily.   Yes [provider]  Zinc 50 MG TABS Take 50 mg by mouth every other day.   Yes [provider]

## 2021-01-24 NOTE — Progress Notes (Signed)
Occupational Therapy Evaluation Patient Details Name: Randy Greene MRN: [id number] DOB: 1965/09/12 Today's Date: 01/24/2021    History of Present Illness The pt is a 55 yo male presenting 8/3 with SOB x 2days and found to be hypotensive and tachycardic at PCP visit. The pt had reccently been d/c (7/28) after being hospitalized for sepsis and PNA, and has been on 5-6L O2 since d/c. PMH includes: MS, OSA on CPAP, HTN, and arthritis.   Clinical Impression   Pt presents with above diagnosis. PTA pt PLOF living at home alone with occasional visits from son. Pt reports being I to mod I with ADLs and IADLs with the use of AE as needed due to PMH of MS. Pt is currently limited with safe ADL engagement due to decreased activity tolerance, safety, and weakness (see below for toileting). Pt educated of energy conservation strategies with handout provided and importance of pursed lip breathing/pacing during activities.Pt will benefit to continue skilled level OT to address established deficits and to increase independence prior to DC to home with [place].    Follow Up Recommendations  Home health OT;Supervision - Intermittent    Equipment Recommendations  None recommended by OT    Recommendations for Other Services       Precautions / Restrictions Precautions Precautions: Other (comment) Precaution Comments: watch SpO2 Restrictions Weight Bearing Restrictions: No      Mobility Bed Mobility Overal bed mobility: Independent                  Transfers Overall transfer level: Needs assistance Equipment used: Rolling walker (2 wheeled) Transfers: Sit to/from Stand Sit to Stand: Supervision         General transfer comment: sit-stand with and without RW, pt is stable with initial stand without need for UE support. 5xsts in 13 sec with no RW    Balance Overall balance assessment: Modified Independent                                         ADL either performed or  assessed with clinical judgement   ADL Overall ADL's : Needs assistance/impaired Eating/Feeding: Independent;Sitting               Upper Body Dressing : Set up;Sitting   Lower Body Dressing: Sit to/from stand;Modified independent Lower Body Dressing Details (indicate cue type and reason): Pt observed adjusting sock while seated EOB. Toilet Transfer: Copy Details (indicate cue type and reason): transfer to restroom with RW         Functional mobility during ADLs: Supervision/safety;Rolling walker General ADL Comments: Pt presented on 5L O2 88% SpO2, increased to 6L prior to toilet transfer. Pt observed to demonstrate limited activity tolerance with max PR 134 and desatted to 87% SpO2 after functional mobility. Pt educated on energy conservation strategies and importance of pacing during ADL tasks. Handout provided.     Vision Patient Visual Report: No change from baseline       Perception     Praxis      Pertinent Vitals/Pain Pain Assessment: No/denies pain Pain Location: 0     Hand Dominance Right   Extremity/Trunk Assessment Upper Extremity Assessment Upper Extremity Assessment: Overall WFL for tasks assessed   Lower Extremity Assessment Lower Extremity Assessment: Defer to PT evaluation   Cervical / Trunk Assessment Cervical / Trunk Assessment: Normal   Communication Communication Communication: No difficulties  Cognition Arousal/Alertness: Awake/alert Behavior During Therapy: WFL for tasks assessed/performed Overall Cognitive Status: Within Functional Limits for tasks assessed                                     General Comments  Pt able to return to 91% SpO2 with 6L O2 and 117 PR after 2 min rest break.    Exercises     Shoulder Instructions      Home Living Family/patient expects to be discharged to:: Private residence Living Arrangements: Alone Available Help at Discharge: Family;Available  PRN/intermittently Type of Home: Apartment Home Access: Level entry     Home Layout: One level     Bathroom Shower/Tub: Teacher, early years/pre: Standard Bathroom Accessibility: Yes   Home Equipment: Walker - 4 wheels;Cane - single point;Tub bench;Electric scooter;Other (comment);Shower seat - built in   Additional Comments: pt confirms information      Prior Functioning/Environment Level of Independence: Independent with assistive device(s)        Comments: using SPC in the home, 4WW outside and electric scooter for community distances.        OT Problem List: Impaired balance (sitting and/or standing);Decreased activity tolerance;Cardiopulmonary status limiting activity      OT Treatment/Interventions: Self-care/ADL training;Energy conservation;Patient/family education    OT Goals(Current goals can be found in the care plan section) Acute Rehab OT Goals Patient Stated Goal: to reduce O2 need OT Goal Formulation: With patient Time For Goal Achievement: 01/20/21 Potential to Achieve Goals: Good  OT Frequency: Min 2X/week   Barriers to D/C:            Co-evaluation              AM-PAC OT "6 Clicks" Daily Activity     Outcome Measure Help from another person eating meals?: None Help from another person taking care of personal grooming?: A Little Help from another person toileting, which includes using toliet, bedpan, or urinal?: A Little Help from another person bathing (including washing, rinsing, drying)?: None Help from another person to put on and taking off regular upper body clothing?: A Little Help from another person to put on and taking off regular lower body clothing?: A Little 6 Click Score: 20   End of Session Equipment Utilized During Treatment: Oxygen Nurse Communication: Mobility status  Activity Tolerance: Patient tolerated treatment well Patient left: in bed;with call bell/phone within reach;with nursing/sitter in room  OT  Visit Diagnosis: Unsteadiness on feet (R26.81)                Time: 5366-4403 OT Time Calculation (min): 21 min Charges:  OT General Charges $OT Visit: 1 Visit  Minus Breeding, MSOT, OTR/L  Supplemental Rehabilitation Services  [phone]   Marius Ditch 01/24/2021, 10:19 AM

## 2021-01-24 NOTE — Progress Notes (Signed)
RT note. Patient refuse cpap for the night. Asked patient if he would try wearing it tomorrow night, patient said yes. CPAP being left in patients room to wear tonight 8/5. Patient currently on 5L [place] sat ok. RT will continue to monitor.

## 2021-01-24 NOTE — Progress Notes (Signed)
HD#2 SUBJECTIVE:  Patient Summary: Randy Greene is a 55 y.o. with a pertinent PMH of recent COVID infection (June 2022), OSA on CPAP, asthma, recent nonspecific interstitial pneumonia, multiple sclerosis, GERD, and trigeminal neuralgia who presented with worsening SOB breath/incresaing O2 requirements and admitted for acute hypoxic respiratory failure.  Overnight Events: No acute events overnight reported.   Interim History: This is hospital day 2 for Randy Greene who was seen and evaluated at the bedside this morning. He remains on 5-6L of O2 via nasal cannula and continues to desaturate with movements/exertion. He denies any shortness of breath at rest and has an occasional cough with breathing treatments.   OBJECTIVE:  Vital Signs: Vitals:   01/23/21 0814 01/23/21 2114 01/23/21 2157 01/24/21 0532  BP: 123/79 119/81  117/85  Pulse: 95 96  94  Resp: 18 18  18   Temp: 97.6 F (36.4 C) 98.5 F (36.9 C)  (!) 97.3 F (36.3 C)  TempSrc: Oral   Oral  SpO2: 94% 96% 96% 95%  Weight:  88 kg    Height:       Supplemental O2: Nasal Cannula SpO2: 95 % O2 Flow Rate (L/min): 5 L/min FiO2 (%): (!) 5 %  Filed Weights   01/22/21 1428 01/23/21 2114  Weight: 88 kg 88 kg     Intake/Output Summary (Last 24 hours) at 01/24/2021 0744 Last data filed at 01/24/2021 0518 Gross per 24 hour  Intake 960 ml  Output 1600 ml  Net -640 ml   Net IO Since Admission: 360 mL [01/24/21 0744]  Physical Exam: General: Pleasant male laying in bed, not acutely ill appearing. No acute distress. CV: RRR. No murmurs, rubs, or gallops. No LE edema, appears euvolemic. No JVD Pulmonary: Dry crackles at bilateral lung bases, greater on the left. No wheezing. Remains on 5-6L O2 via nasal cannula. Abdominal: Soft, nontender, nondistended. Normal bowel sounds. Extremities: Palpable radial and DP pulses. Normal ROM. Skin: Warm and dry. No obvious rash or lesions. Neuro: A&Ox3. Moves all extremities. Normal sensation. No  focal deficit. Psych: Normal mood and affect  Patient Lines/Drains/Airways Status     Active Line/Drains/Airways     Name Placement date Placement time Site Days   Peripheral IV 01/22/21 20 G Left Hand 01/22/21  1430  Hand  2   Airway 8.5 mm 01/14/21  0809  -- 10   Incision (Closed) 08/10/16 Knee Left 08/10/16  1317  -- 1628   Incision (Closed) 01/04/17 Knee Left 01/04/17  1523  -- 1481            Pertinent Labs: CBC Latest Ref Rng & Units 01/23/2021 01/22/2021 01/16/2021  WBC 4.0 - 10.5 K/uL 7.0 8.5 12.2(H)  Hemoglobin 13.0 - 17.0 g/dL 10.4(L) 10.5(L) 11.4(L)  Hematocrit 39.0 - 52.0 % 32.3(L) 32.8(L) 33.9(L)  Platelets 150 - 400 K/uL 369 367 406(H)    CMP Latest Ref Rng & Units 01/24/2021 01/23/2021 01/22/2021  Glucose 70 - 99 mg/dL 120(H) 171(H) 119(H)  BUN 6 - 20 mg/dL 8 11 10   Creatinine 0.61 - 1.24 mg/dL 0.74 0.61 0.85  Sodium 135 - 145 mmol/L 137 134(L) 131(L)  Potassium 3.5 - 5.1 mmol/L 5.2(H) 3.6 4.2  Chloride 98 - 111 mmol/L 100 100 99  CO2 22 - 32 mmol/L 26 24 20(L)  Calcium 8.9 - 10.3 mg/dL 8.9 9.1 8.3(L)  Total Protein 6.5 - 8.1 g/dL 6.7 6.1(L) 6.1(L)  Total Bilirubin 0.3 - 1.2 mg/dL 0.6 0.4 0.6  Alkaline Phos 38 - 126 U/L  192(H) 181(H) 170(H)  AST 15 - 41 U/L 136(H) 123(H) 93(H)  ALT 0 - 44 U/L 225(H) 182(H) 158(H)    No results for input(s): GLUCAP in the last 72 hours.   Pertinent Imaging: VAS [place] LOWER EXTREMITY VENOUS (DVT)  Result Date: 01/23/2021  Lower Venous DVT Study Patient Name:  Randy Greene  Date of Exam:   01/23/2021 Medical Rec #: [id number]    Accession #:    [id number] Date of Birth: 06/28/1965    Patient Gender: M Patient Age:   54Y Exam Location:  [facility] Procedure:      VAS [place] LOWER EXTREMITY VENOUS (DVT) Referring Phys: Ina Homes --------------------------------------------------------------------------------  Indications: Swelling, and Covid 6/22.  Risk Factors: Multiple sclerosis. Comparison Study: No prior study Performing  Technologist: Sharion Dove RVS  Examination Guidelines: A complete evaluation includes B-mode imaging, spectral Doppler, color Doppler, and power Doppler as needed of all accessible portions of each vessel. Bilateral testing is considered an integral part of a complete examination. Limited examinations for reoccurring indications may be performed as noted. The reflux portion of the exam is performed with the patient in reverse Trendelenburg.  +---------+---------------+---------+-----------+----------+--------------+ RIGHT    CompressibilityPhasicitySpontaneityPropertiesThrombus Aging +---------+---------------+---------+-----------+----------+--------------+ CFV      Full           Yes      Yes                                 +---------+---------------+---------+-----------+----------+--------------+ SFJ      Full                                                        +---------+---------------+---------+-----------+----------+--------------+ FV Prox  Full                                                        +---------+---------------+---------+-----------+----------+--------------+ FV Mid   Full                                                        +---------+---------------+---------+-----------+----------+--------------+ FV DistalFull                                                        +---------+---------------+---------+-----------+----------+--------------+ PFV      Full                                                        +---------+---------------+---------+-----------+----------+--------------+ POP      Full           Yes      Yes                                 +---------+---------------+---------+-----------+----------+--------------+  PTV      Full                                                        +---------+---------------+---------+-----------+----------+--------------+ PERO     Full                                                         +---------+---------------+---------+-----------+----------+--------------+   +---------+---------------+---------+-----------+----------+--------------+ LEFT     CompressibilityPhasicitySpontaneityPropertiesThrombus Aging +---------+---------------+---------+-----------+----------+--------------+ CFV      Full           Yes      Yes                                 +---------+---------------+---------+-----------+----------+--------------+ SFJ      Full                                                        +---------+---------------+---------+-----------+----------+--------------+ FV Prox  Full                                                        +---------+---------------+---------+-----------+----------+--------------+ FV Mid   Full                                                        +---------+---------------+---------+-----------+----------+--------------+ FV DistalFull                                                        +---------+---------------+---------+-----------+----------+--------------+ PFV      Full                                                        +---------+---------------+---------+-----------+----------+--------------+ POP      Full           Yes      Yes                                 +---------+---------------+---------+-----------+----------+--------------+ PTV      Full                                                        +---------+---------------+---------+-----------+----------+--------------+  PERO     Full                                                        +---------+---------------+---------+-----------+----------+--------------+     Summary: BILATERAL: - No evidence of deep vein thrombosis seen in the lower extremities, bilaterally. -No evidence of popliteal cyst, bilaterally.   *See table(s) above for measurements and observations. Electronically signed by Servando Snare MD on  01/23/2021 at 3:49:18 PM.    Final    US Abdomen Limited RUQ (LIVER/GB)  Result Date: 01/23/2021 CLINICAL DATA:  Abnormal liver function tests EXAM: ULTRASOUND ABDOMEN LIMITED RIGHT UPPER QUADRANT COMPARISON:  11/29/2012 FINDINGS: Gallbladder: No gallstones or wall thickening visualized. No sonographic Murphy sign noted by sonographer. Common bile duct: Diameter: 2 mm. Liver: No focal lesion identified. Within normal limits in parenchymal echogenicity. Portal vein is patent on color Doppler imaging with normal direction of blood flow towards the liver. Other: None. IMPRESSION: Unremarkable ultrasound of the right upper quadrant. Electronically Signed   By: Davina Poke D.O.   On: 01/23/2021 15:41    ASSESSMENT/PLAN:  Assessment: Principal Problem:   Acute on chronic respiratory failure with hypoxia (HCC) Active Problems:   MS (multiple sclerosis) (HCC)   OSA on CPAP   NSIP (nonspecific interstitial pneumonitis) (HCC)   Plan: #Acute on chronic hypoxic respiratory failure due to post COVID NSIP Patient remains on 5-6L of O2 via nasal cannula and continues to desaturate with movements or exertion. He denies any dysphagia, coughing while eating, or any feeling that food is getting stuck in his throat. Modified barium swallow study performed and show that the patient is at a mild risk for aspiration. Blood cultures with no growth at 2 days and strep pneumoniae urinary antigen also negative. - Pulmonology consulted, appreciate recommendations  - IV solumedrol 1mg /kg   - Diuresed with lasix 40mg  IV x2 doses yesterday  - Will continue Bactrim 3 times weekly for PJP prophylaxis - Albuterol every 6 hours, Singulair once daily - Follow up legionella UA and expectorated sputum w/gram stain - Continue to work with PT/OT  #Hyponatremia, resolved Patient presented with hyponatremia which appeared to be chronic, sodium 131 upon admission. Sodium level improved to 137 today. - Continue to monitor BMP    #Elevated Liver Enzymes Patient presented with AST/ALT elevated at 136/225 and alkaline phosphatase elevated to 192. Total bilirubin level is normal. RUQ ultrasound performed in the setting of elevated liver enzymes and was unremarkable - Continue to trend LFTs - GGT pending - US Liver doppler pending, will follow up results   #Multiple Sclerosis on biologic therapy Patient has multiple sclerosis that is well controlled with Ocrevus therapy every 6 months. At his baseline, he has residual weakness on left side and paresthesias, which remains stable. - Continue home dronabinol 2.5 mg, baclofen 10 mg 4x daily - Metoclopramide 10 mg daily  #Trigeminal Neuralgia secondary to Multiple Sclerosis - Continue home phenytoin 100 BID, pregabalin 150 QHS - Tramadol 50 Q6 PRN  #Unintentional weight loss Patient reports an unintentional weight loss of 15 lbs over the past 2 months, ever since being his diagnosis of COVID. He has had poor oral intake recently and chronic aspiration causing food aversion has been ruled out - Swallowing precautions - Will continue diet of regular texture solids and thin liquids    #  GERD Patient has history of GERD and takes nexium 40mg  BID and pepcid 40mg  daily at home. He was started on Protonix here, as we do not have Nexium on formulary, and this has not helped his heartburn. Patient will have someone bring in home Nexium for his symptoms. - Continue home medications   #Hypertension Patient has a diagnosis of hypertension, however, was hypotensive upon presentation. He has not taking any medications for his BP and it has remained stable with SBP in the 110s. Improved BP could be secondary to the patient's recent weight loss. - Continue to monitor blood pressure   #Hyperlipidemia - Continue home simvastatin and aspirin   #BPH Patient has BPH that is controlled and denies any urinary symptoms this time. - Continue home alfuzosin 10 mg and finasteride 5 mg    #OSA on CPAP Patient has OSA on CPAP at home. He has not been using his CPAP regularly at home as he has been only using his supplemental O2 via nasal cannula. He did not use the CPAP last night. - Attempt CPAP nightly   #Asthma Patient has history of asthma and is on albuterol, singulair, and advair at home. - Continue home albuterol, breo ellipta, and singulair  Best Practice: Diet: Thin fluids IVF: Fluids: none VTE: enoxaparin (LOVENOX) injection 40 mg Start: 01/22/21 1845 Code: Full Therapy Recs: PT/OT eval and treat DISPO: Anticipated discharge to home pending clinical improvement .  Signature: Buddy Duty, D.O.  Internal Medicine Resident, PGY-1 [facility]  Pager: [phone] 7:44 AM, 01/24/2021   Please contact the on call pager after 5 pm and on weekends at [phone].

## 2021-01-24 NOTE — Progress Notes (Signed)
  Echocardiogram 2D Echocardiogram has been performed.  Randy Greene 01/24/2021, 1:53 PM

## 2021-01-24 NOTE — TOC Initial Note (Signed)
Transition of Care [facility]) - Initial/Assessment Note    Patient Details  Name: Randy Greene MRN: [id number] Date of Birth: 28-Jul-1965  Transition of [facility]) CM/SW Contact:    Verdell Carmine, RN Phone Number: 01/24/2021, 1:59 PM  Clinical Narrative:                  Patient admitted with increasing [facility], on home oxygen already and had PT services through [facility], readmission within 7 days. [facility] and admission reference number [place] [id number]. Notified [facility] of admission and probable add on of OT for Home Health.     Barriers to Discharge: Continued Medical Work up   Patient Goals and CMS Choice     Choice offered to / list presented to : Patient  Expected Discharge Plan and Services     Discharge Planning Services: CM Consult                                          Prior Living Arrangements/Services   Lives with:: Self Patient language and need for interpreter reviewed:: Yes        Need for Family Participation in Patient Care: Yes (Comment) Care giver support system in place?: Yes (comment) Current home services: DME, Home PT Criminal Activity/Legal Involvement Pertinent to Current Situation/Hospitalization: No - Comment as needed  Activities of Daily Living Home Assistive Devices/Equipment: Oxygen ADL Screening (condition at time of admission) Patient's cognitive ability adequate to safely complete daily activities?: Yes Is the patient deaf or have difficulty hearing?: No Does the patient have difficulty seeing, even when wearing glasses/contacts?: No Does the patient have difficulty concentrating, remembering, or making decisions?: No Patient able to express need for assistance with ADLs?: Yes Does the patient have difficulty dressing or bathing?: No Independently performs ADLs?: Yes (appropriate for developmental age) Does the patient have difficulty walking or climbing stairs?: No Weakness of Legs: None Weakness of Arms/Hands:  None  Permission Sought/Granted                  Emotional Assessment       Orientation: : Oriented to Self, Oriented to Place, Oriented to  Time, Oriented to Situation Alcohol / Substance Use: Not Applicable Psych Involvement: No (comment)  Admission diagnosis:  Pneumonia [J18.9] SOB (shortness of breath) [R06.02] Respiratory failure (HCC) [J96.90] Patient Active Problem List   Diagnosis Date Noted   Acute on chronic respiratory failure with hypoxia ([place]) 01/23/2021   NSIP (nonspecific interstitial pneumonitis) ([place]) 01/23/2021   Abnormal CT of the chest 10/22/2020   Degenerative arthritis of left knee 08/11/2016   Osteoarthritis of left knee 08/10/2016   Agitation 03/01/2014   Chronic hypercapnic respiratory failure ([place]) 84/16/6063   Metabolic alkalosis with chronic respiratory acidosis 01/01/2014   OSA on CPAP 01/01/2014   Acid reflux 07/17/2013   Transaminitis 11/29/2012   MS (multiple sclerosis) ([place]) 11/29/2012   Recurrent aspiration bronchitis/pneumonia 11/30/2011   PCP:  [place]:   [id number] [place], [address] [place] [address] Phone: [phone] Fax: [phone]  [id number] - [place], [place] [place] [address] Phone: [phone] Fax: [place], [place] - [address] [place] [address] Phone: [phone] Fax:  [phone]     Social Determinants of Health (SDOH) Interventions    Readmission Risk Interventions Readmission Risk Prevention Plan 01/16/2021  Transportation Screening Complete  PCP or Specialist Appt within 3-5 Days Complete  Social Work Consult for [place] Planning/Counseling [place] Not Applicable  Medication  Review Press photographer) Complete  Some recent data might be hidden

## 2021-01-25 ENCOUNTER — Inpatient Hospital Stay (HOSPITAL_COMMUNITY): Payer: No Typology Code available for payment source

## 2021-01-25 DIAGNOSIS — J9621 Acute and chronic respiratory failure with hypoxia: Secondary | ICD-10-CM | POA: Diagnosis not present

## 2021-01-25 LAB — CBC
HCT: 33.9 % — ABNORMAL LOW (ref 39.0–52.0)
Hemoglobin: 11.1 g/dL — ABNORMAL LOW (ref 13.0–17.0)
MCH: 28.2 pg (ref 26.0–34.0)
MCHC: 32.7 g/dL (ref 30.0–36.0)
MCV: 86 fL (ref 80.0–100.0)
Platelets: 359 10*3/uL (ref 150–400)
RBC: 3.94 MIL/uL — ABNORMAL LOW (ref 4.22–5.81)
RDW: 14.8 % (ref 11.5–15.5)
WBC: 7.8 10*3/uL (ref 4.0–10.5)
nRBC: 0 % (ref 0.0–0.2)

## 2021-01-25 LAB — LACTIC ACID, PLASMA
Lactic Acid, Venous: 1.5 mmol/L (ref 0.5–1.9)
Lactic Acid, Venous: 1.9 mmol/L (ref 0.5–1.9)

## 2021-01-25 LAB — COMPREHENSIVE METABOLIC PANEL
ALT: 161 U/L — ABNORMAL HIGH (ref 0–44)
AST: 73 U/L — ABNORMAL HIGH (ref 15–41)
Albumin: 2.2 g/dL — ABNORMAL LOW (ref 3.5–5.0)
Alkaline Phosphatase: 149 U/L — ABNORMAL HIGH (ref 38–126)
Anion gap: 7 (ref 5–15)
BUN: 10 mg/dL (ref 6–20)
CO2: 24 mmol/L (ref 22–32)
Calcium: 8.4 mg/dL — ABNORMAL LOW (ref 8.9–10.3)
Chloride: 102 mmol/L (ref 98–111)
Creatinine, Ser: 0.7 mg/dL (ref 0.61–1.24)
GFR, Estimated: >60 mL/min (ref >60)
Glucose, Bld: 114 mg/dL — ABNORMAL HIGH (ref 70–99)
Potassium: 4.4 mmol/L (ref 3.5–5.1)
Sodium: 133 mmol/L — ABNORMAL LOW (ref 135–145)
Total Bilirubin: 0.7 mg/dL (ref 0.3–1.2)
Total Protein: 5.8 g/dL — ABNORMAL LOW (ref 6.5–8.1)

## 2021-01-25 MED ORDER — VANCOMYCIN HCL 1500 MG/300ML IV SOLN
1500.0000 mg | Freq: Two times a day (BID) | INTRAVENOUS | Status: DC
  Filled 2021-01-25: qty 300

## 2021-01-25 MED ORDER — SODIUM CHLORIDE 0.9 % IV SOLN
2.0000 g | Freq: Once | INTRAVENOUS | Status: AC
  Administered 2021-01-25: 2 g via INTRAVENOUS
  Filled 2021-01-25: qty 2

## 2021-01-25 MED ORDER — SODIUM CHLORIDE 0.9 % IV SOLN
3.0000 g | Freq: Four times a day (QID) | INTRAVENOUS | Status: DC
  Administered 2021-01-25 – 2021-01-27 (×9): 3 g via INTRAVENOUS
  Filled 2021-01-25 (×3): qty 8
  Filled 2021-01-25: qty 3
  Filled 2021-01-25 (×5): qty 8
  Filled 2021-01-25: qty 3
  Filled 2021-01-25: qty 8

## 2021-01-25 MED ORDER — VANCOMYCIN HCL 1500 MG/300ML IV SOLN
1500.0000 mg | Freq: Once | INTRAVENOUS | Status: AC
  Administered 2021-01-25: 1500 mg via INTRAVENOUS
  Filled 2021-01-25: qty 300

## 2021-01-25 MED ORDER — SODIUM CHLORIDE 0.9 % IV BOLUS
1000.0000 mL | Freq: Once | INTRAVENOUS | Status: AC
  Administered 2021-01-25: 1000 mL via INTRAVENOUS

## 2021-01-25 MED ORDER — SODIUM CHLORIDE 0.9 % IV SOLN: 2.0000 g | Freq: Three times a day (TID) | INTRAVENOUS | Status: DC

## 2021-01-25 NOTE — Progress Notes (Signed)
Pharmacy Antibiotic Note  Randy Greene is a 55 y.o. male admitted on 01/22/2021 with acute on chronic respiratory failure, now w/ new fever and SOB >> concern for aspiration pneumonia.  Pharmacy has been consulted for Unasyn dosing.  Plan: Unasyn 3 g IV every 6 hours Monitor clinical progress, cultures/sensitivities, renal function, abx plan   Height: 6\' 2"  (188 cm) Weight: 88 kg (194 lb 0.1 oz) IBW/kg (Calculated) : 82.2  Temp (24hrs), Avg:99.8 F (37.7 C), Min:98 F (36.7 C), Max:102.1 F (38.9 C)  Recent Labs  Lab 01/22/21 1431 01/22/21 1515 01/23/21 0320 01/24/21 0447 01/25/21 0422 01/25/21 0727  WBC 8.5  --  7.0  --   --  7.8  CREATININE 0.85  --  0.61 0.74 0.70  --   LATICACIDVEN  --  1.7  --   --  1.5  --      Estimated Creatinine Clearance: 119.9 mL/min (by C-G formula based on SCr of 0.7 mg/dL).    Allergies  Allergen Reactions   Penicillins Hives and Other (See Comments)    [ +FAMILY HISTORY] Has patient had a PCN reaction causing immediate rash, facial/tongue/throat swelling, SOB or lightheadedness with hypotension: Unknown Has patient had a PCN reaction causing severe rash involving mucus membranes or skin necrosis: Unknown Has patient had a PCN reaction that required hospitalization: No Has patient had a PCN reaction occurring within the last 10 years: No If all of the above answers are "NO", then may proceed with Cephalosporin use.     Tape Rash   Zoloft [Sertraline] Rash   Food Nausea And Vomiting and Other (See Comments)    GREEN PEPPERS- flushed  PT DOES NOT EAT MEAT   Morphine Itching   Mushroom Extract Complex Nausea And Vomiting and Other (See Comments)    And flushed   Morphine And Related Itching   Penicillin G Rash     Thank you for allowing [place] to participate in this patients care. Jens Som, PharmD 01/25/2021 7:53 AM  Please check [email or web address] for unit-specific pharmacy phone numbers.

## 2021-01-25 NOTE — Progress Notes (Signed)
Pharmacy Antibiotic Note  Randy Greene is a 55 y.o. male admitted on 01/22/2021 with acute on chronic respiratory failure, now w/ new fever and SOB >> concern for pneumonia vs sepsis.  Pharmacy has been consulted for vancomycin dosing.  Plan: Vancomycin 1500 mg IV Q12H. Goal AUC 400-550.  Expected AUC 460.  SCr rounded to 0.8.   Height: 6\' 2"  (188 cm) Weight: 88 kg (194 lb 0.1 oz) IBW/kg (Calculated) : 82.2  Temp (24hrs), Avg:99.4 F (37.4 C), Min:97.3 F (36.3 C), Max:102.1 F (38.9 C)  Recent Labs  Lab 01/22/21 1431 01/22/21 1515 01/23/21 0320 01/24/21 0447  WBC 8.5  --  7.0  --   CREATININE 0.85  --  0.61 0.74  LATICACIDVEN  --  1.7  --   --     Estimated Creatinine Clearance: 119.9 mL/min (by C-G formula based on SCr of 0.74 mg/dL).    Allergies  Allergen Reactions   Penicillins Hives and Other (See Comments)    [ +FAMILY HISTORY] Has patient had a PCN reaction causing immediate rash, facial/tongue/throat swelling, SOB or lightheadedness with hypotension: Unknown Has patient had a PCN reaction causing severe rash involving mucus membranes or skin necrosis: Unknown Has patient had a PCN reaction that required hospitalization: No Has patient had a PCN reaction occurring within the last 10 years: No If all of the above answers are "NO", then may proceed with Cephalosporin use.     Tape Rash   Zoloft [Sertraline] Rash   Food Nausea And Vomiting and Other (See Comments)    GREEN PEPPERS- flushed  PT DOES NOT EAT MEAT   Morphine Itching   Mushroom Extract Complex Nausea And Vomiting and Other (See Comments)    And flushed   Morphine And Related Itching   Penicillin G Rash     Thank you for allowing pharmacy to be a part of this patient's care.  Wynona Neat, PharmD, BCPS  01/25/2021 12:49 AM

## 2021-01-25 NOTE — Progress Notes (Addendum)
HD#3 SUBJECTIVE:  Patient Summary: Randy Greene is a 55 y.o. with a pertinent PMH of recent COVID infection (June 2022), OSA on CPAP, asthma, recent nonspecific interstitial pneumonia, multiple sclerosis, GERD, and trigeminal neuralgia who presented with worsening SOB breath/incresaing O2 requirements and admitted for acute hypoxic respiratory failure.  Overnight Events: Patient became febrile overnight at 102.1 and was very diaphoretic upon exam. He continued to breath comfortably on 5L [place] and endorsed palpitations at this time. BP 98/64. Vanc/cefepime was empirically started, CXR ordered, repeat blood cultures, and patient was given 1 L NS bolus.   Interim History: This is hospital day 3 for [name] who was seen and evaluated at the bedside this morning. He reports feeling alright this morning; however, does report fever overnight with profuse sweating. No changes in breathing. Has been able to ambulate; however, oxygen saturations dropped to 80's during this time.  He reports that he was offered a Nissen fundoplication at one point; however, did not want to pursue this due to his cousin having a bad experience.   OBJECTIVE:  Vital Signs: Vitals:   01/24/21 2310 01/25/21 0008 01/25/21 0143 01/25/21 0550  BP:  98/64  102/67  Pulse: 85 88  85  Resp:  (!) 22  20  Temp: (!) 102.1 F (38.9 C) 99.2 F (37.3 C)  99.8 F (37.7 C)  TempSrc: Oral Oral  Oral  SpO2: 95% 97% 92% 98%  Weight:      Height:       Supplemental O2: Room Air SpO2: 98 % O2 Flow Rate (L/min): 5 L/min FiO2 (%): (!) 5 %  Filed Weights   01/22/21 1428 01/23/21 2114  Weight: 88 kg 88 kg     Intake/Output Summary (Last 24 hours) at 01/25/2021 3007 Last data filed at 01/25/2021 0211 Gross per 24 hour  Intake 1760 ml  Output 1000 ml  Net 760 ml   Net IO Since Admission: 1,120 mL [01/25/21 0728]  Physical Exam: General: Pleasant male laying in bed, not acutely ill appearing. No acute distress. CV: RRR. No  murmurs, rubs, or gallops. No LE edema. Pulmonary: Dry crackles at bilateral lung bases, greater on the left. No wheezing. Remains on 5-6L O2 via nasal cannula. Abdominal: Soft, nontender, nondistended. Normal bowel sounds. Extremities: Palpable radial and DP pulses. Normal ROM. Skin: Warm and dry. No obvious rash or lesions. Neuro: A&Ox3. Moves all extremities. Normal sensation. No focal deficit. Psych: Normal mood and affect  Patient Lines/Drains/Airways Status     Active Line/Drains/Airways     Name Placement date Placement time Site Days   Peripheral IV 01/22/21 20 G Left Hand 01/22/21  1430  Hand  3            Pertinent Labs: CBC Latest Ref Rng & Units 01/23/2021 01/22/2021 01/16/2021  WBC 4.0 - 10.5 K/uL 7.0 8.5 12.2(H)  Hemoglobin 13.0 - 17.0 g/dL 10.4(L) 10.5(L) 11.4(L)  Hematocrit 39.0 - 52.0 % 32.3(L) 32.8(L) 33.9(L)  Platelets 150 - 400 K/uL 369 367 406(H)    CMP Latest Ref Rng & Units 01/25/2021 01/24/2021 01/23/2021  Glucose 70 - 99 mg/dL 114(H) 120(H) 171(H)  BUN 6 - 20 mg/dL 10 8 11   Creatinine 0.61 - 1.24 mg/dL 0.70 0.74 0.61  Sodium 135 - 145 mmol/L 133(L) 137 134(L)  Potassium 3.5 - 5.1 mmol/L 4.4 5.2(H) 3.6  Chloride 98 - 111 mmol/L 102 100 100  CO2 22 - 32 mmol/L 24 26 24   Calcium 8.9 - 10.3 mg/dL 8.4(L) 8.9  9.1  Total Protein 6.5 - 8.1 g/dL 5.8(L) 6.7 6.1(L)  Total Bilirubin 0.3 - 1.2 mg/dL 0.7 0.6 0.4  Alkaline Phos 38 - 126 U/L 149(H) 192(H) 181(H)  AST 15 - 41 U/L 73(H) 136(H) 123(H)  ALT 0 - 44 U/L 161(H) 225(H) 182(H)     ASSESSMENT/PLAN:  Assessment: Principal Problem:   Acute on chronic respiratory failure with hypoxia (HCC) Active Problems:   MS (multiple sclerosis) (HCC)   OSA on CPAP   NSIP (nonspecific interstitial pneumonitis) (HCC)   Plan: #Acute on chronic hypoxic respiratory failure due to post COVID NSIP #Possible aspiration pneumonia Patient remains on 5-6L of O2 via nasal cannula and continues to desaturate on exertion. He denies  any dysphagia, coughing while eating, or any feeling that food is getting stuck in his throat. He does report that he was offered a Nissen fundoplication at one point; however, did not want to pursue this due to his cousin having a bad experience. CXR performed today in the setting of new fever and significant diaphoresis, however, it was unchanged as compared to prior CXR. - Pulmonology consulted, appreciate recommendations  - IV solumedrol 1mg /kg   - Will continue Bactrim 3 times weekly for PJP prophylaxis - Started on unasyn for possible aspiration pneumonia  - Albuterol every 6 hours, Singulair once daily - Follow up legionella UA and expectorated sputum w/gram stain - Continue to work with PT/OT  #Hyponatremia Patient presented with hyponatremia which appeared to be chronic, sodium 131 upon admission. Sodium level 133 today. - Continue to monitor BMP   #Elevated Liver Enzymes Patient presented with AST/ALT elevated at 136/225 and alkaline phosphatase elevated to 192. Total bilirubin level is normal. LFTs improved today, although GGT is elevated at 421. US liver doppler found lower measured velocities in the portal vein that may be consistent with portal hypertension. - Continue to trend LFTs - Advised to follow up with GI as an outpatient   #Multiple Sclerosis on biologic therapy Patient has multiple sclerosis that is well controlled with Ocrevus therapy every 6 months. At his baseline, he has residual weakness on left side and paresthesias, which remains stable. - Continue home dronabinol 2.5 mg, baclofen 10 mg 4x daily - Metoclopramide 10 mg daily  #Trigeminal Neuralgia secondary to Multiple Sclerosis - Continue home phenytoin 100 BID, pregabalin 150 QHS - Tramadol 50 Q6 PRN  #Unintentional weight loss Patient reports an unintentional weight loss of 15 lbs over the past 2 months, ever since being his diagnosis of COVID. He has had poor oral intake recently and chronic aspiration  causing food aversion has been ruled out - Swallowing precautions - Will continue diet of regular texture solids and thin liquids    #GERD Patient has history of GERD and takes nexium 40mg  BID and pepcid 40mg  daily at home.  - Continue home Nexium   #Hypertension Patient has a diagnosis of hypertension, however, was hypotensive upon presentation. He has not taking any medications for his BP and it has remained stable with SBP in the 100s-120s today. Improved BP could be secondary to the patient's recent weight loss. - Continue to monitor blood pressure   #Hyperlipidemia - Continue home simvastatin and aspirin   #BPH Patient has BPH that is controlled and denies any urinary symptoms this time. - Continue home alfuzosin 10 mg and finasteride 5 mg   #OSA on CPAP Patient has OSA on CPAP at home. He has not been using his CPAP regularly at home as he has been only  using his supplemental O2 via nasal cannula. Has not been wearing the CPAP at night while in the hospital.  - Attempt CPAP nightly   #Asthma Patient has history of asthma and is on albuterol, singulair, and advair at home. - Continue home albuterol, breo ellipta, and singulair  Best Practice: Diet: Thin fluids IVF: Fluids: none VTE: enoxaparin (LOVENOX) injection 40 mg Start: 01/22/21 1845 Code: Full Therapy Recs: PT/OT eval and treat DISPO: Anticipated discharge to home pending clinical improvement .  Signature: Buddy Duty, D.O.  Internal Medicine Resident, PGY-1 [facility]  Pager: [phone] 7:28 AM, 01/25/2021   Please contact the on call pager after 5 pm and on weekends at [phone].

## 2021-01-25 NOTE — Progress Notes (Signed)
Received call from nurse stating that patient was febrile at 102.1, which has trended up throughout the day (99.7 18:01, 100.2 21:01). Patient was very diaphoretic with entire bed wet and nurse concerned of some altered mental status.   He was recently discharged due to sepsis from COVID-pneumonia. Given his immunocompromised status my threshold for infection is low. SIRS positive with RR 22, HR 96 on exam, labile fever Tmax 102.1. Last WBC was 7.0 with labs due at 05:00.   On exam he is diaphoretic, breathing comfortably on 5L [place]. He was very sleepy though I did wake him from sleep. He was cooperative and able to respond to commands. RRR with no murmurs/rubs/gallops, wheezing present in RLL otherwise CTA bilaterally. States that he feels like his heart is racing and that he feels very hot but no other complaints.  BP of 98/64, RR 22. Repeat temp did come down to 99.2. He has not received any medication for fever at this point due to transaminitis.   He had dinner late but no snack. There was mention in previous notes by pulmonology of aspiration risks though the patient denies any cough or choking while eating or after.  Orders placed: Discontinue bactrim Initiated vancomycin, cefepime.  Azithromycin not initiated per pharmacy reccomendations. Chest XR ordered Repeat blood cultures 1L NS Bolus  Sputum sample was not able to be collected as patient has not had productive cough.

## 2021-01-26 DIAGNOSIS — J9621 Acute and chronic respiratory failure with hypoxia: Secondary | ICD-10-CM | POA: Diagnosis not present

## 2021-01-26 LAB — BASIC METABOLIC PANEL
Anion gap: 8 (ref 5–15)
BUN: 7 mg/dL (ref 6–20)
CO2: 27 mmol/L (ref 22–32)
Calcium: 9 mg/dL (ref 8.9–10.3)
Chloride: 101 mmol/L (ref 98–111)
Creatinine, Ser: 0.62 mg/dL (ref 0.61–1.24)
GFR, Estimated: >60 mL/min (ref >60)
Glucose, Bld: 158 mg/dL — ABNORMAL HIGH (ref 70–99)
Potassium: 4.9 mmol/L (ref 3.5–5.1)
Sodium: 136 mmol/L (ref 135–145)

## 2021-01-26 MED ORDER — ALBUTEROL SULFATE (2.5 MG/3ML) 0.083% IN NEBU: 2.5000 mg | INHALATION_SOLUTION | Freq: Four times a day (QID) | RESPIRATORY_TRACT | Status: DC | PRN

## 2021-01-26 NOTE — Progress Notes (Signed)
Pt refused CPAP for tonight.  

## 2021-01-26 NOTE — Plan of Care (Signed)
  Problem: Clinical Measurements: Goal: Cardiovascular complication will be avoided Outcome: Adequate for Discharge

## 2021-01-26 NOTE — Progress Notes (Signed)
HD#4 SUBJECTIVE:  Patient Summary: Randy Greene is a 55 y.o. with a pertinent PMH of recent COVID infection (June 2022), OSA on CPAP, asthma, recent nonspecific interstitial pneumonia, multiple sclerosis, GERD, and trigeminal neuralgia who presented with worsening SOB breath/incresaing O2 requirements and admitted for acute hypoxic respiratory failure.  Overnight Events: No acute events overnight reported.  Interim History: This is hospital day 4 for Randy Greene who was seen and evaluated at the bedside. Reports feeling better and that he was able to ambulate with PT yesterday; O2 dropped to 88% on 5L [place]. He has been able to get up to bedside chair and currently remains on 5L O2.   OBJECTIVE:  Vital Signs: Vitals:   01/25/21 2048 01/25/21 2101 01/26/21 0229 01/26/21 0500  BP:  105/70  123/90  Pulse:  93  81  Resp:  14  16  Temp:  98 F (36.7 C)  97.7 F (36.5 C)  TempSrc:  Oral  Oral  SpO2: 94% 92% 93% 94%  Weight:      Height:       Supplemental O2: Nasal Cannula SpO2: 94 % O2 Flow Rate (L/min): 6 L/min FiO2 (%): (!) 5 %  Filed Weights   01/22/21 1428 01/23/21 2114  Weight: 88 kg 88 kg     Intake/Output Summary (Last 24 hours) at 01/26/2021 0539 Last data filed at 01/26/2021 0000 Gross per 24 hour  Intake 740 ml  Output 3700 ml  Net -2960 ml   Net IO Since Admission: -1,840 mL [01/26/21 0607]  Physical Exam: General: Pleasant male laying in bed, not acutely ill appearing. No acute distress. CV: RRR. No murmurs, rubs, or gallops. No LE edema. Pulmonary: Dry crackles at bilateral lung bases, improved. No wheezing. Remains on 5L O2 via nasal cannula. Abdominal: Soft, nontender, nondistended. Normal bowel sounds. Extremities: Palpable radial and DP pulses. Normal ROM. Skin: Warm and dry. No obvious rash or lesions. Neuro: A&Ox3. Moves all extremities. Normal sensation. No focal deficit. Psych: Normal mood and affect  Patient Lines/Drains/Airways Status     Active  Line/Drains/Airways     Name Placement date Placement time Site Days   Peripheral IV 01/22/21 20 G Left Hand 01/22/21  1430  Hand  4            Pertinent Labs: CBC Latest Ref Rng & Units 01/25/2021 01/23/2021 01/22/2021  WBC 4.0 - 10.5 K/uL 7.8 7.0 8.5  Hemoglobin 13.0 - 17.0 g/dL 11.1(L) 10.4(L) 10.5(L)  Hematocrit 39.0 - 52.0 % 33.9(L) 32.3(L) 32.8(L)  Platelets 150 - 400 K/uL 359 369 367    CMP Latest Ref Rng & Units 01/26/2021 01/25/2021 01/24/2021  Glucose 70 - 99 mg/dL 158(H) 114(H) 120(H)  BUN 6 - 20 mg/dL 7 10 8   Creatinine 0.61 - 1.24 mg/dL 0.62 0.70 0.74  Sodium 135 - 145 mmol/L 136 133(L) 137  Potassium 3.5 - 5.1 mmol/L 4.9 4.4 5.2(H)  Chloride 98 - 111 mmol/L 101 102 100  CO2 22 - 32 mmol/L 27 24 26   Calcium 8.9 - 10.3 mg/dL 9.0 8.4(L) 8.9  Total Protein 6.5 - 8.1 g/dL - 5.8(L) 6.7  Total Bilirubin 0.3 - 1.2 mg/dL - 0.7 0.6  Alkaline Phos 38 - 126 U/L - 149(H) 192(H)  AST 15 - 41 U/L - 73(H) 136(H)  ALT 0 - 44 U/L - 161(H) 225(H)    No results for input(s): GLUCAP in the last 72 hours.   Pertinent Imaging: DG Chest 2 View  Result Date: 01/25/2021 CLINICAL  DATA:  55 year old male with history of shortness of breath. EXAM: CHEST - 2 VIEW COMPARISON:  Chest x-ray 01/22/2021. FINDINGS: Lung volumes are normal. Patchy multifocal interstitial and airspace disease is again noted throughout the lungs bilaterally, asymmetrically distributed, most compatible with severe multilobar bilateral bronchopneumonia. No pleural effusions. No pneumothorax. No evidence of pulmonary edema. Heart size is normal. Upper mediastinal contours are within normal limits. IMPRESSION: 1. The appearance the chest is most compatible with severe multilobar bilateral bronchopneumonia, similar to the prior examination. Electronically Signed   By: Vinnie Langton M.D.   On: 01/25/2021 09:01    ASSESSMENT/PLAN:  Assessment: Principal Problem:   Acute on chronic respiratory failure with hypoxia (HCC) Active  Problems:   MS (multiple sclerosis) (HCC)   OSA on CPAP   NSIP (nonspecific interstitial pneumonitis) (HCC)   Plan: #Acute on chronic hypoxic respiratory failure due to post COVID NSIP #Possible aspiration pneumonia Patient is back to his baseline 5L of O2 via nasal cannula. He continues to work with PT/OT and has been trying to keep active while in the hospital. He notes that his SpO2 dropped to 88% on 5L yesterday while walking the halls and is going to try to do this again today. He denied any chest pain or palpitations during this, and also did not feel more short of breath than usual.  - Pulmonology consulted, appreciate recommendations  - IV solumedrol 1mg /kg   - Will continue Bactrim 3 times weekly for PJP prophylaxis - Continue on unasyn for possible aspiration pneumonia  - Albuterol every 6 hours, Singulair once daily - Follow up legionella UA and expectorated sputum w/gram stain - Continue to work with PT/OT  #Hyponatremia Patient presented with hyponatremia which appeared to be chronic, sodium 131 upon admission. Sodium level 136 today. - Continue to monitor BMP   #Elevated Liver Enzymes Patient presented with AST/ALT elevated at 136/225 and alkaline phosphatase elevated to 192. Total bilirubin level is normal. LFTs were noted to be improved yesterday. US liver doppler found lower measured velocities in the portal vein that may be consistent with portal hypertension.  - Continue to trend LFTs - Advised to follow up with GI as an outpatient   #Multiple Sclerosis on biologic therapy Patient has multiple sclerosis that is well controlled with Ocrevus therapy every 6 months. At his baseline, he has residual weakness on left side and paresthesias, which remains stable. - Continue home dronabinol 2.5 mg, baclofen 10 mg 4x daily - Metoclopramide 10 mg daily  #Trigeminal Neuralgia secondary to Multiple Sclerosis - Continue home phenytoin 100 BID, pregabalin 150 QHS - Tramadol 50  Q6 PRN  #Unintentional weight loss Patient reports an unintentional weight loss of 15 lbs over the past 2 months, ever since being his diagnosis of COVID. He has had poor oral intake recently and chronic aspiration causing food aversion has been ruled out. During this time he continues to do well with his oral intake - Swallowing precautions - Will continue diet of regular texture solids and thin liquids    #GERD Patient has history of GERD and takes nexium 40mg  BID and pepcid 40mg  daily at home.  - Continue home Nexium   #Hypertension Patient has a diagnosis of hypertension, however, was hypotensive upon presentation. He has not taking any medications for his BP and it has remained stable with SBP in the 100s-120s today. Improved BP could be secondary to the patient's recent weight loss. - Continue to monitor blood pressure   #Hyperlipidemia - Continue home simvastatin  and aspirin   #BPH Patient has BPH that is controlled and denies any urinary symptoms this time. - Continue home alfuzosin 10 mg and finasteride 5 mg   #OSA on CPAP Patient has OSA on CPAP at home. He has not been using his CPAP regularly at home as he has been only using his supplemental O2 via nasal cannula. Was able to wear the CPAP for 3 hours last night, however, he did not get a good seal and then just used his nasal cannula.  - Attempt CPAP nightly   #Asthma Patient has history of asthma and is on albuterol, singulair, and advair at home. - Continue home albuterol, breo ellipta, and singulair  Best Practice: Diet: Thin fluids IVF: Fluids: none VTE: enoxaparin (LOVENOX) injection 40 mg Start: 01/22/21 1845 Code: Full Therapy Recs: PT/OT eval and treat DISPO: Anticipated discharge to home pending clinical improvement .  Signature: Buddy Duty, D.O.  Internal Medicine Resident, PGY-1 [facility]  Pager: [phone] 6:07 AM, 01/26/2021   Please contact the on call pager after  5 pm and on weekends at [phone].

## 2021-01-27 ENCOUNTER — Telehealth: Payer: Self-pay | Admitting: Pulmonary Disease

## 2021-01-27 DIAGNOSIS — J9621 Acute and chronic respiratory failure with hypoxia: Secondary | ICD-10-CM | POA: Diagnosis not present

## 2021-01-27 DIAGNOSIS — J8489 Other specified interstitial pulmonary diseases: Secondary | ICD-10-CM

## 2021-01-27 LAB — COMPREHENSIVE METABOLIC PANEL
ALT: 164 U/L — ABNORMAL HIGH (ref 0–44)
AST: 82 U/L — ABNORMAL HIGH (ref 15–41)
Albumin: 2.2 g/dL — ABNORMAL LOW (ref 3.5–5.0)
Alkaline Phosphatase: 131 U/L — ABNORMAL HIGH (ref 38–126)
Anion gap: 8 (ref 5–15)
BUN: 7 mg/dL (ref 6–20)
CO2: 28 mmol/L (ref 22–32)
Calcium: 9 mg/dL (ref 8.9–10.3)
Chloride: 100 mmol/L (ref 98–111)
Creatinine, Ser: 0.66 mg/dL (ref 0.61–1.24)
GFR, Estimated: >60 mL/min (ref >60)
Glucose, Bld: 109 mg/dL — ABNORMAL HIGH (ref 70–99)
Potassium: 4.6 mmol/L (ref 3.5–5.1)
Sodium: 136 mmol/L (ref 135–145)
Total Bilirubin: 0.5 mg/dL (ref 0.3–1.2)
Total Protein: 5.9 g/dL — ABNORMAL LOW (ref 6.5–8.1)

## 2021-01-27 LAB — LEGIONELLA PNEUMOPHILA SEROGP 1 UR AG: L. pneumophila Serogp 1 Ur Ag: NEGATIVE

## 2021-01-27 MED ORDER — SULFAMETHOXAZOLE-TRIMETHOPRIM 800-160 MG PO TABS: 1.0000 | ORAL_TABLET | ORAL | 0 refills | Status: DC

## 2021-01-27 MED ORDER — PREDNISONE 20 MG PO TABS: 40.0000 mg | ORAL_TABLET | Freq: Every day | ORAL | 0 refills | Status: DC

## 2021-01-27 MED ORDER — PREDNISONE 20 MG PO TABS: 40.0000 mg | ORAL_TABLET | Freq: Every day | ORAL | Status: DC

## 2021-01-27 NOTE — Discharge Summary (Signed)
Name: Randy Greene MRN: [id number] DOB: [birth year] 55 y.o. PCP: [facility]  Date of Admission: 01/22/2021  2:22 PM Date of Discharge:  01/27/2021 Attending Physician: Dr. 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 NSIP Aspiration pneumonia Hyponatremia, resolved Elevated liver function tests Multiple Sclerosis on biologic therapy Trigeminal Neuralgia Unintentional weight loss GERD Hypertension Hyperlipidemia OSA on CPAP BPH Asthma    DISCHARGE MEDICATIONS:   Allergies as of 01/27/2021       Reactions   Penicillins Hives, Other (See Comments)   [ +FAMILY HISTORY] Has patient had a PCN reaction causing immediate rash, facial/tongue/throat swelling, SOB or lightheadedness with hypotension: Unknown Has patient had a PCN reaction causing severe rash involving mucus membranes or skin necrosis: Unknown Has patient had a PCN reaction that required hospitalization: No Has patient had a PCN reaction occurring within the last 10 years: No If all of the above answers are "NO", then may proceed with Cephalosporin use.   Tape Rash   Zoloft [sertraline] Rash   Food Nausea And Vomiting, Other (See Comments)   GREEN PEPPERS- flushed PT DOES NOT EAT MEAT   Morphine Itching   Mushroom Extract Complex Nausea And Vomiting, Other (See Comments)   And flushed   Morphine And Related Itching   Penicillin G Rash        Medication List     STOP taking these medications    traMADol 50 MG tablet Commonly known as: ULTRAM       TAKE these medications    albuterol 108 (90 Base) MCG/ACT inhaler Commonly known as: VENTOLIN HFA Inhale 2  puffs into the lungs every 6 (six) hours as needed for wheezing or shortness of breath.   alfuzosin 10 MG 24 hr tablet Commonly known as: UROXATRAL Take 10 mg by mouth daily with breakfast.   Align 4 MG Caps Take 4 mg by mouth in the morning and at bedtime.   amphetamine-dextroamphetamine 30 MG tablet Commonly known as: ADDERALL Take 30 mg by mouth 2 (two) times daily.   aspirin EC 81 MG tablet Take 81 mg by mouth at bedtime. Swallow whole.   baclofen 10 MG tablet Commonly known as: LIORESAL Take 15 mg by mouth 4 (four) times daily.   diclofenac Sodium 1 % Gel Commonly known as: VOLTAREN Apply 2 g topically 2 (two) times daily as needed (pain).   dronabinol 2.5 MG capsule Commonly known as: MARINOL Take 7.5 mg by mouth 3 (three) times daily.   esomeprazole 40 MG capsule Commonly known as: NEXIUM Take 40 mg by mouth 2 (two) times daily before a meal.   famotidine 40 MG tablet Commonly known as: PEPCID Take 40 mg by mouth at bedtime.   finasteride 5 MG tablet Commonly known as: PROSCAR Take 5 mg by mouth daily.   FISH OIL PO Take 1 capsule by mouth daily.   fluorouracil 5 % cream Commonly known as: EFUDEX Apply 1 application topically 2 (two) times daily.   fluticasone-salmeterol 250-50 MCG/ACT Aepb Commonly known as: ADVAIR Inhale 1 puff into the lungs in the morning and at bedtime.   Folic Acid-Vit D3-OIZ T24 2.5-25-1 MG Tabs tablet Commonly known as: FOLBEE Take  1 tablet by mouth daily.   ketotifen 0.025 % ophthalmic solution Commonly known as: ZADITOR Place 1 drop into both eyes 2 (two) times daily as needed (itching).   magnesium 84 MG (7MEQ) Tbcr SR tablet Commonly known as: MAGTAB Take 42 mg by mouth 2 (two) times daily.   metoCLOPramide 10 MG tablet Commonly known as: REGLAN Take 10 mg by mouth daily with supper.   montelukast 10 MG tablet Commonly known as: SINGULAIR Take 10 mg by mouth daily.   naloxone 4 MG/0.1ML Liqd nasal spray  kit Commonly known as: NARCAN Place 1 spray into the nose See admin instructions. SPRAY 1 SPRAY INTO ONE NOSTRIL AS DIRECTED FOR OPIOID OVERDOSE (TURN PERSON ON SIDE AFTER DOSE. IF NO RESPONSE IN 2-3 MINUTES OR PERSON RESPONDS BUT RELAPSES, REPEAT USING A NEW SPRAY DEVICE AND SPRAY INTO THE OTHER NOSTRIL. CALL 911 AFTER USE.) * EMERGENCY USE ONLY *   phenytoin 100 MG ER capsule Commonly known as: DILANTIN Take 100 mg by mouth 2 (two) times daily.   predniSONE 20 MG tablet Commonly known as: DELTASONE Take 2 tablets (40 mg total) by mouth daily with breakfast for 7 days. Start taking on: January 28, 2021 What changed:  medication strength when to take this   pregabalin 150 MG capsule Commonly known as: LYRICA Take 150 mg by mouth See admin instructions. 168m am, 15110mnoon, and 30020mhs.   simvastatin 10 MG tablet Commonly known as: ZOCOR Take 10 mg by mouth at bedtime.   sulfamethoxazole-trimethoprim 800-160 MG tablet Commonly known as: BACTRIM DS Take 1 tablet by mouth 3 (three) times a week. Start taking on: January 29, 2021   vitamin C 500 MG tablet Commonly known as: ASCORBIC ACID Take 500 mg by mouth daily.   Vitamin D 50 MCG (2000 UT) tablet Take 2,000 Units by mouth daily.   vitamin E 180 MG (400 UNITS) capsule Take 400 Units by mouth daily.   Zinc 50 MG Tabs Take 50 mg by mouth every other day.               Durable Medical Equipment  (From admission, onward)           Start     Ordered   01/27/21 1103  DME Oxygen  Once       Question Answer Comment  Length of Need 6 Months   Oxygen delivery system Gas      01/27/21 1104            DISPOSITION AND FOLLOW-UP:  [name] [facility]. At the hospital follow up visit please address:  Follow-up Recommendations: Consults: Pulmonary medicine Labs/studies: Chest x-ray Medications: START prednisone 32m56meryday for 1 week and  Bactrim 3 times a week until follow up with pulmonology  Follow-up Appointments:  Follow-up Information     [facility]. Schedule an appointment as soon as possible for a visit in 1 week(s).   Specialty: Pulmonology Why: follow up with pulmonology in 1 week Contact information: [address] [place] [phone] in 2 week(s).   Specialty: General Practice Why: hospital follow up Contact information: [id number] Summary: #Acute on chronic hypoxic respiratory failure due to post COVID  NSIP #Possible aspiration pneumonia Patient was recently discharged from the hospital following a diagnosis of cryptogenic organizing pneumonia. He presented following an increase in dyspnea on exertion and hypoxia despite using 5L supplemental O2 via nasal cannula. He does not have fever, cough, or leukocytosis, making a viral or bacterial pneumonia less likely at this time. BAL at the last admission was performed but did not show any infectious etiology for his symptoms. Patient has also had 15 lb weight loss in the last 2 months and a loss of appetite. His modified Wells score <4 on presentation with d-dimer 1.66. CXR noted to have bilateral patchy heterogeneous opacities with increased opacities of the upper and lower left lung and improved right lower lung opacity, consistent with organizing pneumonia. Lower extremity duplex with no evidence of DVT bilaterally. Patient remains on 5-6L of O2 via nasal cannula on 8/5 and continued to desaturate with movements or exertion. He denies any dysphagia, coughing while eating, or any feeling that food is getting stuck in his throat. Modified barium swallow study performed and show that the patient is at a mild risk for aspiration. Blood cultures with no growth at 2 days and strep  pneumoniae urinary antigen also negative. Patient became febrile overnight on 8/5, Tmax 102. The patient was very diaphoretic, but denied an increase in his shortness of breath, chest pain, palpitations, or any other sxs. Repeat CXR performed and was unchanged compared to prior. Repeat blood cultures also ordered. Patient received a dose of vanc/zosyn at this time, however, these were discontinued and unasyn was started for possible aspiration pneumonia coverage. On 8/7 the patient noted that he was able to walk the halls with PT and his SpO2 got down to 88%, which is improved compared to prior days. Patient remained afebrile x2 nights and upon the day of discharge denied shortness of breath, chest pain, palpitations, or any other symptoms. Unasyn discontinued. He will be discharged on oral steroids and will follow up with pulmonology in 1 week.   #Hyponatremia, resolved Patient has hyponatremia which appears to be chronic, sodium 131 upon presentation. Sodium level improved to 134 after 1L NS fluid bolus. Remained stable over the course of his admission.   #Elevated Liver Enzymes Patient presented with AST/ALT elevated at 123/182 and alkaline phosphatase elevated to 181. Total bilirubin level is normal. RUQ ultrasound performed in the setting of elevated liver enzymes and was unremarkable. GGT elevated at 421 and liver doppler significant for lower measured velocities in the portal vein that may be consistent with some degree of portal hypertension. LFTs improved on 8/6. Will consider outpatient GI follow up for further evaluation.   #Multiple Sclerosis on biologic therapy Patient has multiple sclerosis that he states is well controlled with Ocrevus therapy every 6 months. His symptoms include optic neuritis and left leg weakness at his baseline. Has residual weakness on left side and paresthesias, which remains stable.  #Trigeminal Neuralgia Patient has trigeminal neuralgia secondary to his Multiple  Sclerosis. Continued home phenytoin 100 BID, pregabalin 150 QHS and added Tramadol 50 Q6 PRN   #Unintentional weight loss Patient reports an unintentional weight loss of 15 lbs over the past 2 months, ever since being diagnosed with COVID. Patient has had poor oral intake recently, although chronic aspiration causing food aversion must be ruled out. Modified barium swallow showing that patient is at a mild risk for aspiration. Received a short course of unasyn for possible aspiration pneumonia coverage.   #GERD Patient has history of GERD. Takes nexium  3m BID and pepcid 452mdaily. He was started on Protonix here, as we do not have Nexium on formulary, and this has not helped his heartburn. Patient had someone bring in home Nexium for his symptoms which improved his heartburn.    #Hypertension Patient has hypertension, however, was hypotensive upon presentation. Patient was previously on Cardura 4 mg but it was held during last admission in the setting of hypotension. Currently not taking any medications for his BP and it has remained stable with SBP in the 120s. Improved BP could be secondary to the patient's recent weight loss.   #Hyperlipidemia Continued home simvastatin and aspirin   #BPH Patient has BPH that is controlled and denies any urinary symptoms this time. Continued home alfuzosin 10 mg and finasteride 5 mg     #OSA on CPAP Patient has OSA on CPAP at home. He has not been using his CPAP regularly at home as he has been only using his supplemental O2 via nasal cannula. He was able to wear the cpap at night on 8/6 for a few hours, however, he did not get a good seal and then switched to the nasal cannula. Did not wear the CPAP other than then.    #Asthma Patient has history of asthma and is on albuterol, singulair, and advair at home. Continue home albuterol, breo ellipta, and singulair   DISCHARGE INSTRUCTIONS:   Discharge Instructions     Call MD for:  difficulty  breathing, headache or visual disturbances   Complete by: As directed    Diet - low sodium heart healthy   Complete by: As directed    Increase activity slowly   Complete by: As directed        SUBJECTIVE:  [name] seen and evaluated on the day of discharge. He reports breathing better and was able to sit up in his chair for most of the day yesterday and bathe himself. He continues to require supplemental O2, however, his requirements have remained stable and he does not report any worsening shortness of breath. Stable for discharge per pulmonology.   Discharge Vitals:   BP 129/79 (BP Location: Right Arm)   Pulse 81   Temp 98.1 F (36.7 C) (Oral)   Resp 18   Ht _0  (1.88 m)   Wt 88 kg   SpO2 95%   BMI 24.91 kg/m   OBJECTIVE:   General: Pleasant male laying in bed, not acutely ill appearing. No acute distress. CV: RRR. No murmurs, rubs, or gallops. No LE edema. Pulmonary: Minimal dry crackles at bilateral lung bases, overall improved. No wheezing. Remains on 5L O2 via nasal cannula. Abdominal: Soft, nontender, nondistended. Normal bowel sounds. Extremities: Palpable radial and DP pulses. Normal ROM. Skin: Warm and dry. No obvious rash or lesions. Neuro: A&Ox3. Moves all extremities. Normal sensation. No focal deficit. Psych: Normal mood and affect  Pertinent Labs, Studies, and Procedures:  CBC Latest Ref Rng & Units 01/25/2021 01/23/2021 01/22/2021  WBC 4.0 - 10.5 K/uL 7.8 7.0 8.5  Hemoglobin 13.0 - 17.0 g/dL 11.1(L) 10.4(L) 10.5(L)  Hematocrit 39.0 - 52.0 % 33.9(L) 32.3(L) 32.8(L)  Platelets 150 - 400 K/uL 359 369 367    CMP Latest Ref Rng & Units 01/27/2021 01/26/2021 01/25/2021  Glucose 70 - 99 mg/dL 109(H) 158(H) 114(H)  BUN 6 - 20 mg/dL _1 Creatinine 0.61 - 1.24 mg/dL 0.66 0.62 0.70  Sodium 135 - 145 mmol/L 136 136 133(L)  Potassium 3.5 - 5.1 mmol/L 4.6  4.9 4.4  Chloride 98 - 111 mmol/L 100 101 102  CO2 22 - 32 mmol/L _0 Calcium 8.9 - 10.3 mg/dL 9.0  9.0 8.4(L)  Total Protein 6.5 - 8.1 g/dL 5.9(L) - 5.8(L)  Total Bilirubin 0.3 - 1.2 mg/dL 0.5 - 0.7  Alkaline Phos 38 - 126 U/L 131(H) - 149(H)  AST 15 - 41 U/L 82(H) - 73(H)  ALT 0 - 44 U/L 164(H) - 161(H)    DG Chest Port 1 View  Result Date: 01/22/2021 CLINICAL DATA:  Shortness of breath EXAM: PORTABLE CHEST 1 VIEW COMPARISON:  Chest x-ray dated January 15, 2021 FINDINGS: Cardiac and mediastinal contours are unchanged. Bilateral patchy heterogeneous pulmonary opacities which have worsened in the upper and lower left lung, but improved in the lower right lung. No large pleural effusion or pneumothorax. IMPRESSION: Bilateral patchy heterogeneous opacities with increased opacities of the upper and lower left lung and improved right lower lung opacity. Given waxing waning opacities, organizing pneumonia is a consideration. Electronically Signed   By: Yetta Glassman MD   On: 01/22/2021 15:27   VAS [place] LOWER EXTREMITY VENOUS (DVT)  Result Date: 01/23/2021  Lower Venous DVT Study Patient Name:  Randy Greene  Date of Exam:   01/23/2021 Medical Rec #: [id number]    Accession #:    [id number] Date of Birth: Aug 06, 1965    Patient Gender: M Patient Age:   54Y Exam Location:  [facility] Procedure:      VAS [place] LOWER EXTREMITY VENOUS (DVT) Referring Phys: Ina Homes --------------------------------------------------------------------------------  Indications: Swelling, and Covid 6/22.  Risk Factors: Multiple sclerosis. Comparison Study: No prior study Performing Technologist: Sharion Dove RVS  Examination Guidelines: A complete evaluation includes B-mode imaging, spectral Doppler, color Doppler, and power Doppler as needed of all accessible portions of each vessel. Bilateral testing is considered an integral part of a complete examination. Limited examinations for reoccurring indications may be performed as noted. The reflux portion of the exam is performed with the patient in reverse Trendelenburg.   +---------+---------------+---------+-----------+----------+--------------+ RIGHT    CompressibilityPhasicitySpontaneityPropertiesThrombus Aging +---------+---------------+---------+-----------+----------+--------------+ CFV      Full           Yes      Yes                                 +---------+---------------+---------+-----------+----------+--------------+ SFJ      Full                                                        +---------+---------------+---------+-----------+----------+--------------+ FV Prox  Full                                                        +---------+---------------+---------+-----------+----------+--------------+ FV Mid   Full                                                        +---------+---------------+---------+-----------+----------+--------------+  FV DistalFull                                                        +---------+---------------+---------+-----------+----------+--------------+ PFV      Full                                                        +---------+---------------+---------+-----------+----------+--------------+ POP      Full           Yes      Yes                                 +---------+---------------+---------+-----------+----------+--------------+ PTV      Full                                                        +---------+---------------+---------+-----------+----------+--------------+ PERO     Full                                                        +---------+---------------+---------+-----------+----------+--------------+   +---------+---------------+---------+-----------+----------+--------------+ LEFT     CompressibilityPhasicitySpontaneityPropertiesThrombus Aging +---------+---------------+---------+-----------+----------+--------------+ CFV      Full           Yes      Yes                                  +---------+---------------+---------+-----------+----------+--------------+ SFJ      Full                                                        +---------+---------------+---------+-----------+----------+--------------+ FV Prox  Full                                                        +---------+---------------+---------+-----------+----------+--------------+ FV Mid   Full                                                        +---------+---------------+---------+-----------+----------+--------------+ FV DistalFull                                                        +---------+---------------+---------+-----------+----------+--------------+  PFV      Full                                                        +---------+---------------+---------+-----------+----------+--------------+ POP      Full           Yes      Yes                                 +---------+---------------+---------+-----------+----------+--------------+ PTV      Full                                                        +---------+---------------+---------+-----------+----------+--------------+ PERO     Full                                                        +---------+---------------+---------+-----------+----------+--------------+     Summary: BILATERAL: - No evidence of deep vein thrombosis seen in the lower extremities, bilaterally. -No evidence of popliteal cyst, bilaterally.   *See table(s) above for measurements and observations. Electronically signed by Servando Snare MD on 01/23/2021 at 3:49:18 PM.    Final    US Abdomen Limited RUQ (LIVER/GB)  Result Date: 01/23/2021 CLINICAL DATA:  Abnormal liver function tests EXAM: ULTRASOUND ABDOMEN LIMITED RIGHT UPPER QUADRANT COMPARISON:  11/29/2012 FINDINGS: Gallbladder: No gallstones or wall thickening visualized. No sonographic Murphy sign noted by sonographer. Common bile duct: Diameter: 2 mm. Liver: No focal lesion  identified. Within normal limits in parenchymal echogenicity. Portal vein is patent on color Doppler imaging with normal direction of blood flow towards the liver. Other: None. IMPRESSION: Unremarkable ultrasound of the right upper quadrant. Electronically Signed   By: Davina Poke D.O.   On: 01/23/2021 15:41      FOLLOW-UP INSTRUCTIONS:  Thank you for allowing [place] to be part of your care. You were hospitalized for Acute on chronic respiratory failure with hypoxia ([place]).  Please follow up with the following providers: A. [place], [address] [place] [address], [phone]  B. [facility] in 1-2 weeks  Please note these changes made to your medications:    A. Medications to start:    predniSONE (DELTASONE) tablet 40 mg Oral, with breakfast   sulfamethoxazole-trimethoprim (BACTRIM DS) 800-160 MG per tablet 1 tablet, 1 tablet, Oral, Once per day on Mon Wed Fri   Please make sure to Take your prednisone (34m) for 1 week and to take bactrim 3 times a week, until you follow up with pulmonology in 1-2 weeks.  Please call our clinic if you have any questions or concerns, we may be able to help and keep you from a long and expensive emergency room wait. Our clinic and after hours phone number is [phone] the best time to call is Monday through Friday 9 am to 4 pm but there is always someone available 24/7 if you have an emergency. If you need  medication refills please notify your pharmacy one week in advance and they will send [place] a request.    Signed: Buddy Duty, D.O.  Internal Medicine Resident, PGY-1 [facility]  Pager: [phone] 11:04 AM, 01/27/2021

## 2021-01-27 NOTE — TOC Transition Note (Addendum)
Transition of Care [facility]) - CM/SW Discharge Note   Patient Details  Name: Randy Greene MRN: [id number] Date of Birth: [birth year]  [facility] of [facility]) CM/SW Contact:  Bartholomew Crews, RN Phone Number: [phone] 01/27/2021, 1:25 PM   Clinical Narrative:     Patient transitioned home today. HH PT order provided by MD. Catalina Antigua at [facility] for resumption of services. Paged Gabriel Earing [phone] at [facility] to advise of discharge - call back pending.   1338: DC summary, HH orders, and DME orders faxed to PCP, Dr. Nyoka Cowden, and CSW, [place], at [place] [phone]. CM contact information provided on fax cover sheet.  1715: Notified by Speech that patient would benefit from continued speech therapy either outpatient or home health. Notified [facility] of need, Alvis Lemmings will submit request to PCP at [facility] for services.   Final next level of care: [place] Barriers to Discharge: No Barriers Identified   Patient Goals and CMS Choice     Choice offered to / list presented to : Patient  Discharge Placement                       Discharge Plan and Services   Discharge Planning Services: CM Consult                      HH Arranged: PT [facility] Agency: [place] Date [place]: 01/27/21 Time HH Agency Contacted: 1300 Representative spoke with at [place]: [place] ([place]) Interventions     Readmission Risk Interventions Readmission Risk Prevention Plan 01/16/2021  Transportation Screening Complete  PCP or Specialist Appt within 3-5 Days Complete  Social Work Consult for [place] Planning/Counseling [place] Not Applicable  Medication Review Press photographer) Complete  Some recent data might be hidden

## 2021-01-27 NOTE — Telephone Encounter (Signed)
Patient currently still in hospital. There are no appt within a week.   RB please advise if we can use a held spot. No appt with app's until the end of the month. Thanks :)

## 2021-01-27 NOTE — Plan of Care (Signed)

## 2021-01-27 NOTE — Progress Notes (Signed)
Physical Therapy Treatment Patient Details Name: Randy Greene MRN: [id number] DOB: 07-12-1965 Today's Date: 01/27/2021    History of Present Illness The pt is a 55 yo male presenting 8/3 with SOB x 2days and found to be hypotensive and tachycardic at PCP visit. The pt had reccently been d/c (7/28) after being hospitalized for sepsis and PNA, and has been on 5-6L O2 since d/c. PMH includes: MS, OSA on CPAP, HTN, and arthritis.    PT Comments    Pt is making excellent progress towards his goals today. Pt is independent with ambulation and supervision for stair ascent/descent. Limiting factor continues to be drop in SaO2 with activity. Able to ambulate and climb stairs on 6L while maintaining SaO2 >90%O2. Ambulates on 4L with drop in SaO2 to 88%O2 able to recover in less than 1 min with purse lip breathing. Educated on need for use of IS at discharge and monitoring of SaO2 with activity. Encouraged movement every hour to build tolerance. Pt is excited about d/c home this afternoon.     Follow Up Recommendations  Home health PT;Supervision for mobility/OOB     Equipment Recommendations  None recommended by PT       Precautions / Restrictions Precautions Precautions: Other (comment) Precaution Comments: watch SpO2 Restrictions Weight Bearing Restrictions: No    Mobility  Bed Mobility Overal bed mobility: Independent                  Transfers Overall transfer level: Independent                  Ambulation/Gait Ambulation/Gait assistance: Supervision   Assistive device: Rolling walker (2 wheeled) Gait Pattern/deviations: WFL(Within Functional Limits) Gait velocity: WFL Gait velocity interpretation: 1.31 - 2.62 ft/sec, indicative of limited community ambulator General Gait Details: slow steady gait, pushing IV pole but does not need it for support, continues to have increased O2 demand   Stairs Stairs: Yes Stairs assistance: Supervision Stair Management: No  rails Number of Stairs: 4 (1 stepx4 due to IV pole) General stair comments: strong, steady ascent/descent, able to maintain SaO2 >90% on 6L O2         Balance Overall balance assessment: Modified Independent                                          Cognition Arousal/Alertness: Awake/alert Behavior During Therapy: WFL for tasks assessed/performed Overall Cognitive Status: Within Functional Limits for tasks assessed                                           General Comments General comments (skin integrity, edema, etc.): Pt on 5L O2 on entry with SaO2 98%O2, ambulated on 6L due to tank settings with ambulation SaO2 94%O2, with stair climb SaO2 90%O2, HR 120 bpm, able to ambulate back to room on 4L O2 slight drop in SaO2 to 88%O2 however recovered >90% within 1 min and able to maintain SaO2 >90%O2. Educated on need for graded return to activity and need to watch O2 levels and let them be his guide.      Pertinent Vitals/Pain Pain Assessment: No/denies pain     PT Goals (current goals can now be found in the care plan section) Acute Rehab PT Goals Patient Stated Goal: to reduce O2  need PT Goal Formulation: With patient Time For Goal Achievement: 02/06/21 Potential to Achieve Goals: Fair Progress towards PT goals: Progressing toward goals    Frequency    Min 3X/week      PT Plan Current plan remains appropriate       AM-PAC PT "6 Clicks" Mobility   Outcome Measure  Help needed turning from your back to your side while in a flat bed without using bedrails?: None Help needed moving from lying on your back to sitting on the side of a flat bed without using bedrails?: None Help needed moving to and from a bed to a chair (including a wheelchair)?: A Little Help needed standing up from a chair using your arms (e.g., wheelchair or bedside chair)?: A Little Help needed to walk in hospital room?: A Little Help needed climbing 3-5 steps with  a railing? : A Lot 6 Click Score: 19    End of Session Equipment Utilized During Treatment: Oxygen;Gait belt Activity Tolerance: Treatment limited secondary to medical complications (Comment) Patient left: in bed;with call bell/phone within reach Nurse Communication: Mobility status PT Visit Diagnosis: Difficulty in walking, not elsewhere classified (R26.2)     Time: 1031-5945 PT Time Calculation (min) (ACUTE ONLY): 29 min  Charges:  $Gait Training: 8-22 mins $Therapeutic Exercise: 8-22 mins                     Brae Schaafsma B. Migdalia Dk PT, DPT Acute Rehabilitation Services Pager [phone] Office [phone]    [place] 01/27/2021, 11:19 AM

## 2021-01-27 NOTE — Progress Notes (Signed)
Occupational Therapy Treatment Patient Details Name: Randy Greene MRN: [id number] DOB: [birth year] Today's Date: 01/27/2021    History of present illness The pt is a 55 yo male presenting 8/3 with SOB x 2days and found to be hypotensive and tachycardic at PCP visit. The pt had reccently been d/c (7/28) after being hospitalized for sepsis and PNA, and has been on 5-6L O2 since d/c. PMH includes: MS, OSA on CPAP, HTN, and arthritis.   OT comments  Pt making good progress with functional goals. Session focused on ambulating to bathroom, toilet and shower transfers and ADLs. Pt to d/c home this afternoon  Follow Up Recommendations  Home health OT;Supervision - Intermittent    Equipment Recommendations  None recommended by OT    Recommendations for Other Services      Precautions / Restrictions Precautions Precautions: Other (comment) Precaution Comments: watch SpO2 Restrictions Weight Bearing Restrictions: No       Mobility Bed Mobility Overal bed mobility: Independent                  Transfers Overall transfer level: Independent Equipment used: 1 person hand held assist;None Transfers: Sit to/from Stand Sit to Stand: Supervision Stand pivot transfers: Supervision            Balance Overall balance assessment: Modified Independent                                         ADL either performed or assessed with clinical judgement   ADL Overall ADL's : Needs assistance/impaired                                       General ADL Comments: Pt sup - mod I with all ADLs/selfcare, sup with shower and toilet transfers     Vision Baseline Vision/History: Wears glasses Patient Visual Report: No change from baseline     Perception     Praxis      Cognition Arousal/Alertness: Awake/alert Behavior During Therapy: WFL for tasks assessed/performed Overall Cognitive Status: Within Functional Limits for tasks assessed                                           Exercises     Shoulder Instructions       General Comments 4L O2, SATs remained >90% during in room activity    Pertinent Vitals/ Pain       Pain Assessment: No/denies pain  Home Living                                          Prior Functioning/Environment              Frequency  Min 2X/week        Progress Toward Goals  OT Goals(current goals can now be found in the care plan section)  Progress towards OT goals: Progressing toward goals  Acute Rehab OT Goals Patient Stated Goal: to reduce O2 need  Plan Discharge plan remains appropriate    Co-evaluation  AM-PAC OT "6 Clicks" Daily Activity     Outcome Measure   Help from another person eating meals?: None Help from another person taking care of personal grooming?: A Little Help from another person toileting, which includes using toliet, bedpan, or urinal?: A Little Help from another person bathing (including washing, rinsing, drying)?: None Help from another person to put on and taking off regular upper body clothing?: None Help from another person to put on and taking off regular lower body clothing?: A Little 6 Click Score: 21    End of Session Equipment Utilized During Treatment: Oxygen  OT Visit Diagnosis: Unsteadiness on feet (R26.81)   Activity Tolerance Patient tolerated treatment well   Patient Left in bed;with call bell/phone within reach (sitting EOB)   Nurse Communication          Time: 8882-8003 OT Time Calculation (min): 13 min  Charges: OT General Charges $OT Visit: 1 Visit OT Treatments $Self Care/Home Management : 8-22 mins     Britt Bottom 01/27/2021, 2:20 PM

## 2021-01-27 NOTE — Progress Notes (Signed)
NAME:  Randy Greene, MRN:  [id number], DOB:  02-03-1966, LOS: 4 ADMISSION DATE:  01/22/2021, CONSULTATION DATE:  01/27/21  REFERRING MD: IM TS, CHIEF COMPLAINT: Chest pain, dyspnea  History of Present Illness:  55 year old man known to practice. RB pt History of MS on ocrelizumab q58mo, asthma, had COVID and post-COVID NSIP admitted 7/20-7/28 Bronch r/o concomittant infection neg 7/26 Rheum workup neg Trial of steroids with some improvement Went home then started feeling bad again with worsening DOE Remains on 5-6L at rest, desats with any movement. Denies aspiration symptoms but has had in past PCCM asked to see what else can be done No sick contacts, no fevers, occasional productive cough with MDI use   Pertinent  Medical History  MS on immunosuppression drug Diastolic dysfunction  Recent COVID infection in 11/2020  Significant Hospital Events: Including procedures, antibiotic start and stop dates in addition to other pertinent events   admitted 7/20-7/28 -scattered bilateral patchy airspace disease?  Post COVID 8/3 readmitted 8/5 swallow evaluation minimal pharyngeal dysphagia 8/20 venous duplex negative  Interim History / Subjective:   Afebrile. Denies cough On 5 L nasal cannula , on ambulation saturation dropped to 88%, but he denies dyspnea   Objective   Blood pressure 116/87, pulse 78, temperature (!) 97.5 F (36.4 C), resp. rate 14, height 6\' 2"  (1.88 m), weight 88 kg, SpO2 96 %.        Intake/Output Summary (Last 24 hours) at 01/27/2021 6712 Last data filed at 01/27/2021 4580 Gross per 24 hour  Intake 1223 ml  Output 1200 ml  Net 23 ml    Filed Weights   01/22/21 1428 01/23/21 2114  Weight: 88 kg 88 kg    Examination: Constitutional: no distress  Ears, nose, mouth, and throat: MM dry, trachea midline Cardiovascular: RRR, ext warm Respiratory: No accessory muscle use, no rhonchi, crackles right base Gastrointestinal: soft, +BS, nontender Skin: No rashes,  normal turgor Neurologic: Grossly nonfocal, alert and interactive Psychiatric: excellent insight Ext: no edema, wwp  Labs show normal electrolytes, mild transaminitis improved from prior Chest x-ray 8/6 independently reviewed shows persistent bilateral unchanged airspace disease Resolved Hospital Problem list     Assessment & Plan:  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 Recurrent microaspiration possible and interval retrocardiac opacity could be consistent with this. No signs of new infection.  Fibrotic changes apparent on recent CT which are new compared to previous CT from 10/2020   -Given stability of oxygen requirements, can switch to oral prednisone 40 mg and discharge at this dose with office follow-up with his primary pulmonologist Dr. Lamonte Sakai in 1 week with repeat chest x-ray.  He was being followed for airspace disease in the left upper lobe but would hold off repeat CT until resolution of patchy infiltrates - Bactrim PJP ppx  - Reviewed lit for his ocrelizumab, no link with pneumonitis;   Transaminitis- new, unclear cause; congestion? AED effect? - No other good agents for his trigeminal neuralgia (carbamazepine caused severe hyponatremia) and low suspicion for this drug exasperating things, fine to leave as is -Echo shows normal LV function, liver Doppler negative -High GGT suggest intrahepatic cholestasis  Dysphagia-  pt has has multiple dysphagia assessments including endoscopy, barium swallow, pH study, esophageal manometry, and modified barium swallow. Pt was evaluated by a surgeon in the past for  a possible nissen fundoplication, but pt declined this procedure. Pt evaluated [facility] GI at [facility] on 01/04[facility]17 due to aspiration and reflux after 7 hospitalizations that year for pneumonia,  MS on  biologic Unintentional weight loss post covid- pulmonary cachexia and poor PO may be contributory  Labs   CBC: Recent Labs  Lab 01/22/21 1431 01/23/21 0320 01/25/21 0727  WBC 8.5 7.0 7.8  NEUTROABS  --  5.4  --   HGB 10.5* 10.4* 11.1*  HCT 32.8* 32.3* 33.9*  MCV 87.2 86.6 86.0  PLT 367 369 359     Basic Metabolic Panel: Recent Labs  Lab 01/23/21 0320 01/24/21 0447 01/25/21 0422 01/26/21 0237 01/27/21 0238  NA 134* 137 133* 136 136  K 3.6 5.2* 4.4 4.9 4.6  CL 100 100 102 101 100  CO2 24 26 24 27 28   GLUCOSE 171* 120* 114* 158* 109*  BUN 11 8 10 7 7   CREATININE 0.61 0.74 0.70 0.62 0.66  CALCIUM 9.1 8.9 8.4* 9.0 9.0    GFR: Estimated Creatinine Clearance: 119.9 mL/min (by C-G formula based on SCr of 0.66 mg/dL). Recent Labs  Lab 01/22/21 1431 01/22/21 1515 01/23/21 0320 01/25/21 0422 01/25/21 0727  WBC 8.5  --  7.0  --  7.8  LATICACIDVEN  --  1.7  --  1.5 1.9     Liver Function Tests: Recent Labs  Lab 01/22/21 1431 01/23/21 0320 01/24/21 0447 01/25/21 0422 01/27/21 0238  AST 93* 123* 136* 73* 82*  ALT 158* 182* 225* 161* 164*  ALKPHOS 170* 181* 192* 149* 131*  BILITOT 0.6 0.4 0.6 0.7 0.5  PROT 6.1* 6.1* 6.7 5.8* 5.9*  ALBUMIN 2.2* 2.2* 2.4* 2.2* 2.2*    No results for input(s): LIPASE, AMYLASE in the last 168 hours. No results for input(s): AMMONIA in the last 168 hours.  ABG    Component Value Date/Time   PHART 7.493 (H) 03/06/2014 0154   PCO2ART 34.6 (L) 03/06/2014 0154   PO2ART 57.9 (L) 03/06/2014 0154   HCO3 26.3 (H) 03/06/2014 0154   TCO2 27.3 03/06/2014 0154   O2SAT 91.0 03/06/2014 0154     PCCM will be available as needed, outpatient pulmonary follow-up will be made  Kara Mead MD. FCCP. [facility] Pager : [phone]  If no response to pager , please call [phone] until 7 pm After 7:00 pm call [place]  [phone]   01/27/2021

## 2021-01-27 NOTE — Telephone Encounter (Signed)
Please make outpatient follow-up appointment post hospital visit in 1 week with Dr. Jonelle Sports with chest x-ray

## 2021-01-27 NOTE — Discharge Instructions (Signed)
FOLLOW-UP INSTRUCTIONS:  Thank you for allowing Randy Greene to be part of your care. You were hospitalized for Acute on chronic respiratory failure with hypoxia ([place]).  Please follow up with the following providers: A. [facility], [address] [place] [address], [phone]  B. [facility] in 1-2 weeks  Please note these changes made to your medications:   A. Medications to continue: Current Meds  Medication Sig   albuterol (PROVENTIL HFA;VENTOLIN HFA) 108 (90 BASE) MCG/ACT inhaler Inhale 2 puffs into the lungs every 6 (six) hours as needed for wheezing or shortness of breath.   alfuzosin (UROXATRAL) 10 MG 24 hr tablet Take 10 mg by mouth daily with breakfast.   amphetamine-dextroamphetamine (ADDERALL) 30 MG tablet Take 30 mg by mouth 2 (two) times daily.   aspirin EC 81 MG tablet Take 81 mg by mouth at bedtime. Swallow whole.   baclofen (LIORESAL) 10 MG tablet Take 15 mg by mouth 4 (four) times daily.    Cholecalciferol (VITAMIN D) 50 MCG (2000 UT) tablet Take 2,000 Units by mouth daily.   diclofenac Sodium (VOLTAREN) 1 % GEL Apply 2 g topically 2 (two) times daily as needed (pain).   dronabinol (MARINOL) 2.5 MG capsule Take 7.5 mg by mouth 3 (three) times daily.    esomeprazole (NEXIUM) 40 MG capsule Take 40 mg by mouth 2 (two) times daily before a meal.   famotidine (PEPCID) 40 MG tablet Take 40 mg by mouth at bedtime.   finasteride (PROSCAR) 5 MG tablet Take 5 mg by mouth daily.   fluorouracil (EFUDEX) 5 % cream Apply 1 application topically 2 (two) times daily.   fluticasone-salmeterol (ADVAIR) 250-50 MCG/ACT AEPB Inhale 1 puff into the lungs in the morning and at bedtime.   Folic Acid-Vit T0-GYI R48 (FOLBEE) 2.5-25-1 MG TABS tablet Take 1 tablet by mouth daily.   ketotifen (ZADITOR) 0.025 % ophthalmic solution Place 1 drop into both eyes 2 (two) times daily as needed (itching).   magnesium (MAGTAB) 84 MG (7MEQ) TBCR SR tablet Take 42 mg by mouth 2 (two) times  daily.   metoCLOPramide (REGLAN) 10 MG tablet Take 10 mg by mouth daily with supper.   montelukast (SINGULAIR) 10 MG tablet Take 10 mg by mouth daily.   naloxone (NARCAN) nasal spray 4 mg/0.1 mL Place 1 spray into the nose See admin instructions. SPRAY 1 SPRAY INTO ONE NOSTRIL AS DIRECTED FOR OPIOID OVERDOSE (TURN PERSON ON SIDE AFTER DOSE. IF NO RESPONSE IN 2-3 MINUTES OR PERSON RESPONDS BUT RELAPSES, REPEAT USING A NEW SPRAY DEVICE AND SPRAY INTO THE OTHER NOSTRIL. CALL 911 AFTER USE.) * EMERGENCY USE ONLY *   Omega-3 Fatty Acids (FISH OIL PO) Take 1 capsule by mouth daily.   phenytoin (DILANTIN) 100 MG ER capsule Take 100 mg by mouth 2 (two) times daily.   [EXPIRED] predniSONE (DELTASONE) 10 MG tablet Take 4 tablets (40 mg total) by mouth daily for 7 days.   pregabalin (LYRICA) 150 MG capsule Take 150 mg by mouth See admin instructions. 150mg  am, 150mg  noon, and 300mg  qhs.   Probiotic Product (ALIGN) 4 MG CAPS Take 4 mg by mouth in the morning and at bedtime.   simvastatin (ZOCOR) 10 MG tablet Take 10 mg by mouth at bedtime.   traMADol (ULTRAM) 50 MG tablet Take 50 mg by mouth every 6 (six) hours as needed for moderate pain or severe pain.   vitamin C (ASCORBIC ACID) 500 MG tablet Take 500 mg by mouth daily.  vitamin E 180 MG (400 UNITS) capsule Take 400 Units by mouth daily.   Zinc 50 MG TABS Take 50 mg by mouth every other day.      B. Medications to start:     predniSONE (DELTASONE) tablet 40 mg Oral, with breakfast   sulfamethoxazole-trimethoprim (BACTRIM DS) 800-160 MG per tablet 1 tablet, 1 tablet, Oral, Once per day on Mon Wed Fri    Please make sure to Take your prednisone (40mg ) for 1 week and to take bactrim 3 times a week, until you follow up with pulmonology in 1-2 weeks.  Please call our clinic if you have any questions or concerns, we may be able to help and keep you from a long and expensive emergency room wait. Our clinic and after hours phone number is [phone], the  best time to call is Monday through Friday 9 am to 4 pm but there is always someone available 24/7 if you have an emergency. If you need medication refills please notify your pharmacy one week in advance and they will send Randy Greene a request.

## 2021-01-28 LAB — CULTURE, BLOOD (ROUTINE X 2)
Culture: NO GROWTH
Culture: NO GROWTH
Special Requests: ADEQUATE

## 2021-01-29 ENCOUNTER — Ambulatory Visit (INDEPENDENT_AMBULATORY_CARE_PROVIDER_SITE_OTHER): Payer: No Typology Code available for payment source | Admitting: Emergency Medicine

## 2021-01-29 ENCOUNTER — Other Ambulatory Visit: Payer: Self-pay

## 2021-01-29 ENCOUNTER — Encounter: Payer: Self-pay | Admitting: Emergency Medicine

## 2021-01-29 DIAGNOSIS — G4733 Obstructive sleep apnea (adult) (pediatric): Secondary | ICD-10-CM

## 2021-01-29 DIAGNOSIS — Z9989 Dependence on other enabling machines and devices: Secondary | ICD-10-CM | POA: Diagnosis not present

## 2021-01-29 DIAGNOSIS — J9611 Chronic respiratory failure with hypoxia: Secondary | ICD-10-CM | POA: Diagnosis not present

## 2021-01-29 DIAGNOSIS — J8489 Other specified interstitial pulmonary diseases: Secondary | ICD-10-CM

## 2021-01-29 MED ORDER — PREDNISONE 20 MG PO TABS: 40.0000 mg | ORAL_TABLET | Freq: Every day | ORAL | 0 refills | Status: AC

## 2021-01-29 NOTE — Assessment & Plan Note (Signed)
Unclear whether he will be able to wean completely off O2 although that is his goal.  If the infiltrates improve then it may be possible.  For now we will perform walking oximetry on pulse flow oxygen to titrate him for POC

## 2021-01-29 NOTE — Assessment & Plan Note (Signed)
Continue CPAP nightly. °

## 2021-01-29 NOTE — Assessment & Plan Note (Signed)
Etiology is unclear, question post COVID although the infiltrates have waxed and waned, flaring more over the last 3 to 4 weeks, note CT chest 7/24.  Bronchoscopy without any evidence of opportunistic infection.  He does have a history of aspiration but was deemed a low aspiration risk on his most recent MBS and he does practice swallowing precautions, does not note any overt episodes.  Question whether this could be related to his ocrelizumab although again no literature to support.  In absence of any infectious cause I think is reasonable to extend his treatment prednisone dose for another week, 2 weeks total following the hospitalization.  I will push back the date of his repeat CT scan to early September to look for interval improvement.

## 2021-01-29 NOTE — Patient Instructions (Addendum)
Continue your oxygen.  We will titrate you today for a portable oxygen concentrator.  Hopefully we will be able to slowly decrease her flow rate going forward. We will extend your prednisone course to 40 mg daily for 2 weeks total.  Then you can stop. We will push back your CT scan of the chest to early September. Try using saline nasal spray for nose irritation and dryness Continue to be careful with your swallowing precautions Follow with Dr. Lamonte Sakai in early September after your CT scan so that we can review the results together

## 2021-01-29 NOTE — Progress Notes (Signed)
Subjective:    Patient ID: Randy Greene, male    DOB: 30-Jun-1965, 55 y.o.   MRN: [id number]  HPI 55 year old man, former smoker (30 pack years), history of childhood asthma, trigeminal neuralgia and multiple sclerosis complicated by anterograde memory loss with hospitalizations in the past for severe pneumonia question associated with aspiration, severe sepsis complicated by agitated delirium (2015), OSA on CPAP, hypertension, GERD (Nexium twice daily, Pepcid, uses a wedge pillow now).  Carries a history of asthma with reported [facility] ([facility] Symbicort), also allergic rhinitis on cetirizine, Atrovent nasal spray but doesn' t use frequently, Singulair. Also has flonase that he never uses. His aspiration issues are much improved with rx of his GERD and d/c xanax.   Seen at the [place] he was seen at the [facility] 08/26/2020 at which time he was having increased wheeze for 2 to 3 months, stable cough and dyspnea.  Pulmonary function testing was obtained, unavailable to me at this time.  He also had a CT chest that was done on 09/12/2020 as below. He has albuterol that does seem to help him with exertional SOB, also some SOB at rest. Seemed to correspond with increased allergy season. Minimal cough. He hears wheeze daily for the last month - may be a bit better lately. Does respond to albuterol. No pred or abx since his aspiration improved.   Chest CT 09/12/2020 reviewed by me, shows small hiatal hernia, calcified granulomata in the medial right lower lobe, right hilar nodes as well as the spleen, some peribronchovascular nodular and groundglass opacity in the left upper and lower lobes, largest focus up to 10 mm, question some acute multifocal inflammatory or infectious process.  No effusions.  No other concerning nodules   ROV 11/27/20 --follow-up visit 55 year old gentleman, former smoker with a history of childhood asthma, MS with chronic aspiration, OSA on CPAP, GERD.  Also with an abnormal CT  with some scattered bilateral groundglass nodularity done 09/12/2020.  He has intermittent wheezing, benefits from albuterol. He activity is limited by his MS, ? Possibly also his breathing. He does still have some wheeze and SOB with exertion. He uses albuterol intermittently which does help him.  A lot of variability in the frequency.   Pulmonary function testing done today reviewed by me, shows restriction without a bronchodilator response, normal lung volumes, normal diffusion capacity.  Possibly small amount of curve to his flow volume loop that could suggest mild obstruction.  CT scan of the chest 10/30/2020 reviewed by me, shows a vague 2.5 mm subpleural right milligrams as pulmonary nodule, question lymph node.  Also some left upper lobe airspace opacity that appears to be postinflammatory, likely stable compared with 09/12/2020.   Hosp F/u visit  01/29/21 --55 year old man, former smoker with MS and chronic aspiration, OSA on CPAP, GERD.  No definite obstruction by PFT although possible curve of his flow-volume loop.  We have followed some left upper lobe and scattered inflammatory foci on CT chest, stable March/2022.  Unfortunately since our last visit he had COVID-19 6/27, admitted 7/20 for bacterial pneumonia and associated sepsis.  He had multifocal pulmonary infiltrates at that time and underwent bronchoscopy: Respiratory culture negative, AFB smear negative, fungal culture negative.  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  Discharged on O2 4 L/min.  Admitted 01/22/2021 with recurrent progressive dyspnea.  Treated again with corticosteroids, Bactrim prophylaxis.  No clear evidence that the pneumonitis is  associated with ocrelizumab (for his MS).  Remains on prednisone, planned 32m qd for at least a week. He is interested in a POC. No overt aspiration.   ANCA 01/14/2021 negative, ANA negative, aldolase slightly elevated at  12.3 MBS 01/24/21 - mild aspiration risk, precautions reviewed.    Review of Systems As per HPi      Objective:   Physical Exam Vitals:   01/29/21 1021  BP: 120/74  Pulse: (!) 122  Temp: 98.3 F (36.8 C)  TempSrc: Oral  SpO2: 94%  Weight: 192 lb (87.1 kg)  Height: _0  (1.88 m)   Gen: Pleasant, well-nourished, in no distress,  normal affect.  He has lost weight, about 20 pounds  ENT: No lesions, oropharynx clear.  Strong voice.  No cough  Neck: No JVD, no stridor  Lungs: No use of accessory muscles, no crackles or wheezing on normal respiration, no wheeze on forced expiration  Cardiovascular: Distant RRR, heart sounds normal, no murmur or gallops, no peripheral edema  Musculoskeletal: No deformities, no cyanosis or clubbing  Neuro: alert, awake, non focal  Skin: Warm, no lesions or rash      Assessment & Plan:  NSIP (nonspecific interstitial pneumonitis) (HCC) Etiology is unclear, question post COVID although the infiltrates have waxed and waned, flaring more over the last 3 to 4 weeks, note CT chest 7/24.  Bronchoscopy without any evidence of opportunistic infection.  He does have a history of aspiration but was deemed a low aspiration risk on his most recent MBS and he does practice swallowing precautions, does not note any overt episodes.  Question whether this could be related to his ocrelizumab although again no literature to support.  In absence of any infectious cause I think is reasonable to extend his treatment prednisone dose for another week, 2 weeks total following the hospitalization.  I will push back the date of his repeat CT scan to early September to look for interval improvement.  OSA on CPAP Continue CPAP nightly  Chronic hypoxemic respiratory failure ([facility] [place] Unclear whether he will be able to wean completely off O2 although that is his goal.  If the infiltrates improve then it may be possible.  For now we will perform walking oximetry on pulse flow  oxygen to titrate him for POC  Time spent 42 minutes  RBaltazar Apo MD, PhD 01/29/2021, 10:45 AM [facility] [phone] if no answer before 7:00PM call [phone] For any issues after 7:00PM please call eLink [phone]

## 2021-01-29 NOTE — Telephone Encounter (Signed)
Was able to see him in office today

## 2021-01-29 NOTE — Addendum Note (Signed)
Addended by: Gavin Potters R on: 01/29/2021 12:06 PM   Modules accepted: Orders

## 2021-01-30 LAB — CULTURE, BLOOD (ROUTINE X 2)
Culture: NO GROWTH
Culture: NO GROWTH
Special Requests: ADEQUATE

## 2021-02-03 NOTE — Telephone Encounter (Signed)
Called spoke with patient.  Scheduled with RB on 02/04/21 nothing further needed at this time.

## 2021-02-04 ENCOUNTER — Ambulatory Visit (INDEPENDENT_AMBULATORY_CARE_PROVIDER_SITE_OTHER): Payer: No Typology Code available for payment source | Admitting: Emergency Medicine

## 2021-02-04 ENCOUNTER — Encounter: Payer: Self-pay | Admitting: Physical Medicine & Rehabilitation

## 2021-02-04 ENCOUNTER — Other Ambulatory Visit: Payer: Self-pay

## 2021-02-04 ENCOUNTER — Encounter: Payer: Self-pay | Admitting: Emergency Medicine

## 2021-02-04 ENCOUNTER — Encounter
Payer: No Typology Code available for payment source | Attending: Physical Medicine & Rehabilitation | Admitting: Physical Medicine & Rehabilitation

## 2021-02-04 VITALS — BP 129/90 | HR 98 | Temp 97.9°F | Ht 74.0 in | Wt 198.6 lb

## 2021-02-04 DIAGNOSIS — J9611 Chronic respiratory failure with hypoxia: Secondary | ICD-10-CM

## 2021-02-04 DIAGNOSIS — J8489 Other specified interstitial pulmonary diseases: Secondary | ICD-10-CM | POA: Diagnosis not present

## 2021-02-04 DIAGNOSIS — M47817 Spondylosis without myelopathy or radiculopathy, lumbosacral region: Secondary | ICD-10-CM | POA: Diagnosis present

## 2021-02-04 NOTE — Assessment & Plan Note (Signed)
Currently on 4 L/min continuous.  He feels better, wants to try and see if he can qualify for decreased dose that would be delivered via POC.  We will walk him for this today.

## 2021-02-04 NOTE — Assessment & Plan Note (Signed)
Etiology unclear, question post COVID changes versus autoimmune.  At risk for aspiration although has not noticed any overt aspiration symptoms.  I extended his high-dose prednisone course, has 5 more days.  At that time we will start a slow wean.  I will reimage him in mid September.  Continue Bactrim prophylaxis

## 2021-02-04 NOTE — Addendum Note (Signed)
Addended by: Gavin Potters R on: 02/04/2021 11:29 AM   Modules accepted: Orders

## 2021-02-04 NOTE — Progress Notes (Signed)
Subjective:    Patient ID: Randy Greene, male    DOB: 1965/10/18, 55 y.o.   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  Carries a history of asthma with reported normal P[facility] ([facility] Symbicort), also allergic rhinitis on cetirizine, Atrovent nasal spray but doesn' t use frequently, Singulair. Also has flonase that he never uses. His aspiration issues are much improved with rx of his GERD and d/c xanax.   Seen at the [place] he was seen at the [facility] 08/26/2020 at which time he was having increased wheeze for 2 to 3 months, stable cough and dyspnea.  Pulmonary function testing was obtained, unavailable to me at this time.  He also had a CT chest that was done on 09/12/2020 as below. He has albuterol that does seem to help him with exertional SOB, also some SOB at rest. Seemed to correspond with increased allergy season. Minimal cough. He hears wheeze daily for the last month - may be a bit better lately. Does respond to albuterol. No pred or abx since his aspiration improved.   Chest CT 09/12/2020 reviewed by me, shows small hiatal hernia, calcified granulomata in the medial right lower lobe, right hilar nodes as well as the spleen, some peribronchovascular nodular and groundglass opacity in the left upper and lower lobes, largest focus up to 10 mm, question some acute multifocal inflammatory or infectious process.  No effusions.  No other concerning nodules   ROV 11/27/20 --follow-up visit 55 year old gentleman, former smoker with a history of childhood asthma, MS with chronic aspiration, OSA on CPAP, GERD.  Also with an abnormal CT  with some scattered bilateral groundglass nodularity done 09/12/2020.  He has intermittent wheezing, benefits from albuterol. He activity is limited by his MS, ? Possibly also his breathing. He does still have some wheeze and SOB with exertion. He uses albuterol intermittently which does help him.  A lot of variability in the frequency.   Pulmonary function testing done today reviewed by me, shows restriction without a bronchodilator response, normal lung volumes, normal diffusion capacity.  Possibly small amount of curve to his flow volume loop that could suggest mild obstruction.  CT scan of the chest 10/30/2020 reviewed by me, shows a vague 2.5 mm subpleural right milligrams as pulmonary nodule, question lymph node.  Also some left upper lobe airspace opacity that appears to be postinflammatory, likely stable compared with 09/12/2020.   Hosp F/u visit  01/29/21 --55 year old man, former smoker with MS and chronic aspiration, OSA on CPAP, GERD.  No definite obstruction by PFT although possible curve of his flow-volume loop.  We have followed some left upper lobe and scattered inflammatory foci on CT chest, stable March/2022.  Unfortunately since our last visit he had COVID-19 6/27, admitted 7/20 for bacterial pneumonia and associated sepsis.  He had multifocal pulmonary infiltrates at that time and underwent bronchoscopy: Respiratory culture negative, AFB smear negative, fungal culture negative.  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  Discharged on O2 4 L/min.  Admitted 01/22/2021 with recurrent progressive dyspnea.  Treated again with corticosteroids, Bactrim prophylaxis.  No clear evidence that the pneumonitis is  associated with ocrelizumab (for his MS).  Remains on prednisone, planned 82m qd for at least a week. He is interested in a POC. No overt aspiration.   ANCA 01/14/2021 negative, ANA negative, aldolase slightly elevated at  12.3 MBS 01/24/21 - mild aspiration risk, precautions reviewed.   ROV 02/04/21 --follow-up visit for 55year old man with MS on ocrelizumab, chronic aspiration.  Also OSA on CPAP, GERD.  Possible mild obstruction based on curve of his flow-volume loop.  We have been following multifocal pulmonary infiltrates, suspected to be post COVID NSIP although etiology unclear.  Bronchoscopy without any evidence of opportunistic infection.  Swallow evaluation was overall reassuring.  My plan was to extend his prednisone dosing for another week, follow clinical status and then repeat his imaging. He returns today reporting.  He is interested in trying to wean his oxygen so he can qualify for POC and we can check this today. Good compliance with CPAP   Review of Systems As per HPi      Objective:   Physical Exam Vitals:   02/04/21 1020  BP: 124/74  Pulse: 100  Temp: 97.9 F (36.6 C)  TempSrc: Oral  SpO2: 98%  Weight: 198 lb 6.4 oz (90 kg)  Height: _0  (1.88 m)   Gen: Pleasant, well-nourished, in no distress,  normal affect.  He has lost weight, about 20 pounds  ENT: No lesions, oropharynx clear.  Strong voice.  No cough  Neck: No JVD, no stridor  Lungs: No use of accessory muscles, no crackles or wheezing on normal respiration, no wheeze on forced expiration  Cardiovascular: Distant RRR, heart sounds normal, no murmur or gallops, no peripheral edema  Musculoskeletal: No deformities, no cyanosis or clubbing  Neuro: alert, awake, non focal  Skin: Warm, no lesions or rash      Assessment & Plan:  NSIP (nonspecific interstitial pneumonitis) (HCC) Etiology unclear, question post COVID changes versus autoimmune.  At risk for aspiration although has not noticed any overt aspiration symptoms.  I extended his high-dose prednisone course, has 5 more days.  At that time we will start a slow wean.  I will reimage him in mid September.  Continue Bactrim prophylaxis  Chronic hypoxemic respiratory  failure (HCC) Currently on 4 L/min continuous.  He feels better, wants to try and see if he can qualify for decreased dose that would be delivered via POC.  We will walk him for this today.   RBaltazar Apo MD, PhD 02/04/2021, 10:34 AM [facility] [phone] if no answer before 7:00PM call [phone] For any issues after 7:00PM please call eLink [phone]

## 2021-02-04 NOTE — Addendum Note (Signed)
Addended by: Gavin Potters R on: 02/04/2021 11:02 AM   Modules accepted: Orders

## 2021-02-04 NOTE — Progress Notes (Signed)
Subjective:    Patient ID: Randy Greene, male    DOB: 1965-09-13, 55 y.o.   MRN: [id number]  HPI 55 year old male with prior history of multiple sclerosis as well as lumbar spondylosis who returns today with primary complaint of left-sided low back pain.  Interval medical history positive for COVID-19 pneumonia requiring hospitalization.  No mechanical ventilation but has had long-term oxygen since discharge from hospital in July.  He has followed up with his pulmonary doctor today. The patient states that his low back pain started insidiously.  He has been in bed a lot.  Physical therapy has started working with him.  He has no pain that radiates into his leg.  The location of his pain is his usual pain that he has experienced in the past.  He has had good relief lasting on average around 6 months status post L3-L4 medial branch and L5 dorsal ramus radiofrequency neurotomy.  This was last performed on the left side on 09/03/2020 and on the right side on 08/06/2020 Pain Inventory Average Pain 7  Pain Right Now 7 My pain is constant and dull  In the last 24 hours, has pain interfered with the following? General activity 7 Relation with others 0 Enjoyment of life 0 What TIME of day is your pain at its worst? evening Sleep (in general) Fair  Pain is worse with: bending and some activites Pain improves with: injections and RF & Bath soaks Relief from Meds: 7  Family History  Problem Relation Age of Onset   Dementia Mother    Hypertension Father    Heart attack Father    Social History   Socioeconomic History   Marital status: Single    Spouse name: Not on file   Number of children: Not on file   Years of education: Not on file   Highest education level: Not on file  Occupational History   Not on file  Tobacco Use   Smoking status: Former    Packs/day: 1.00    Years: 20.00    Pack years: 20.00    Types: Cigarettes    Quit date: 11/02/2006    Years since quitting: 14.2    Smokeless tobacco: Never  Vaping Use   Vaping Use: Never used  Substance and Sexual Activity   Alcohol use: Yes   Drug use: No    Comment: medical marijuana   Sexual activity: Never  Other Topics Concern   Not on file  Social History Narrative   Not on file   Social Determinants of Health   Financial Resource Strain: Not on file  Food Insecurity: Not on file  Transportation Needs: Not on file  Physical Activity: Not on file  Stress: Not on file  Social Connections: Not on file   Past Surgical History:  Procedure Laterality Date   ARTHROSCOPIC     BRONCHIAL WASHINGS  01/14/2021   Procedure: [place];  Surgeon: Candee Furbish, MD;  Location: [place];  Service: Pulmonary;;   HERNIA REPAIR     INCISION AND DRAINAGE Left 01/04/2017   Procedure: INCISION AND DRAINAGE LEFT KNEE;  Surgeon: Rod Can, MD;  Location: [place];  Service: Orthopedics;  Laterality: Left;   jawsurgery     KNEE ARTHROPLASTY Left 08/10/2016   Procedure: LEFT TOTAL KNEE ARTHROPLASTY WITH COMPUTER NAVIGATION;  Surgeon: Rod Can, MD;  Location: [place];  Service: Orthopedics;  Laterality: Left;   knees scope     VIDEO BRONCHOSCOPY Bilateral 01/14/2021   Procedure: VIDEO  BRONCHOSCOPY WITHOUT FLUORO;  Surgeon: Candee Furbish, MD;  Location[facility] ENDOSCOPY;  Service: Pulmonary;  Laterality: Bilateral;   Past Surgical History:  Procedure Laterality Date   ARTHROSCOPIC     BRONCHIAL WASHINGS  01/14/2021   Procedure: BRONCHIAL WASHINGS;  Surgeon: Candee Furbish, MD;  Location[facility] ENDOSCOPY;  Service: Pulmonary;;   HERNIA REPAIR     INCISION AND DRAINAGE Left 01/04/2017   Procedure: INCISION AND DRAINAGE LEFT KNEE;  Surgeon: Rod Can, MD;  Location: [place];  Service: Orthopedics;  Laterality: Left;   jawsurgery     KNEE ARTHROPLASTY Left 08/10/2016   Procedure: LEFT TOTAL KNEE ARTHROPLASTY WITH COMPUTER NAVIGATION;  Surgeon: Rod Can, MD;  Location: [place];  Service: Orthopedics;   Laterality: Left;   knees scope     VIDEO BRONCHOSCOPY Bilateral 01/14/2021   Procedure: VIDEO BRONCHOSCOPY WITHOUT FLUORO;  Surgeon: Candee Furbish, MD;  Location: [facility] ENDOSCOPY;  Service: Pulmonary;  Laterality: Bilateral;   Past Medical History:  Diagnosis Date   Acid reflux    Arthritis    Aspiration pneumonia (HCC)    Asthma    Delayed wound healing    left knee   Dyspnea    Enlarged prostate    Hypercapnic respiratory failure, chronic (HCC)    Hypertension    MS (multiple sclerosis) (HCC)    BP (!) 135/91   Pulse (!) 101   Temp 97.9 F (36.6 C)   Ht 6\' 2"  (1.88 m)   Wt 198 lb 9.6 oz (90.1 kg)   SpO2 97% Comment: 4 Liters O2  BMI 25.50 kg/m   Opioid Risk Score:   Fall Risk Score:  `1  Depression screen PHQ 2/9  Depression screen [facility] 2/9 02/04/2021 09/03/2020 02/20/2020 10/26/2019 02/21/2019  Decreased Interest 0 0 0 0 0  Down, Depressed, Hopeless 0 - 0 0 0  PHQ - 2 Score 0 0 0 0 0  Altered sleeping - - - - 0  Tired, decreased energy - - - - 2  Change in appetite - - - - 1  Feeling bad or failure about yourself  - - - - 0  Trouble concentrating - - - - 2  Moving slowly or fidgety/restless - - - - 0  Suicidal thoughts - - - - 0  PHQ-9 Score - - - - 5  Difficult doing work/chores - - - - Somewhat difficult     Review of Systems  Musculoskeletal:  Positive for back pain, gait problem and neck pain.       Left lower back, left leg pain  All other systems reviewed and are negative.     Objective:   Physical Exam Vitals and nursing note reviewed.  Constitutional:      Appearance: He is normal weight.  HENT:     Head: Normocephalic and atraumatic.  Eyes:     Extraocular Movements: Extraocular movements intact.     Conjunctiva/sclera: Conjunctivae normal.     Pupils: Pupils are equal, round, and reactive to light.  Musculoskeletal:        General: Tenderness present.     Right lower leg: No edema.     Left lower leg: No edema.     Comments: Patient has  tenderness left-sided lumbar paraspinal muscles on the right side There is pain with left lateral bending as well as extension of the lumbar spine at the lumbosacral junction.  Skin:    General: Skin is warm and dry.  Neurological:  Mental Status: He is alert and oriented to person, place, and time.     Comments: Motor strength is 5/5 bilateral hip flexor knee extensor ankle dorsiflexor Ambulates without assist device no evidence of toe drag or knee instability  Psychiatric:        Mood and Affect: Mood normal.        Behavior: Behavior normal.          Assessment & Plan:   .  Lumbar spondylosis without myelopathy recurrence of left-sided pain after prolonged illness with COVID-19 pneumonia.  Agree with physical therapy We will schedule for repeat left L3-L4 medial branch and left L5 dorsal ramus radiofrequency neurotomy under fluoroscopic guidance in 1 month which would be 6 months after the prior procedure. Will assess the right-sided lumbar pain at that time to see if it is starting to recur as well.

## 2021-02-04 NOTE — Patient Instructions (Addendum)
Complete your planned course of prednisone 40 mg daily (5 more days) We will then decrease to 20 mg daily and stay on that dose, probably for about 1 month.  Depending on how you are doing we will then decide how to taper the prednisone further with a goal to stop completely over the next few months. We will plan to repeat your CT scan of the chest in September We will repeat your walking oximetry to see if you can qualify for portable oxygen concentrator. Follow with Dr. Lamonte Sakai in September after CT scan so that we can review the results together and plan how to decrease the prednisone

## 2021-02-06 ENCOUNTER — Ambulatory Visit (HOSPITAL_COMMUNITY): Payer: No Typology Code available for payment source

## 2021-02-10 NOTE — Telephone Encounter (Signed)
I am fine with him actively weaning his O2 as long as he maintains SpO2 > 90%

## 2021-02-10 NOTE — Telephone Encounter (Signed)
Received the following message from patient:   "Good morning Dr. Lamonte Sakai.  My O2 levels are consistently in the 97-98 percent level at rest and 94-95 percent when I am doing my exercises with the occupational therapist.  My machine is on 4 liters per minute.  Would you mind if I turned it down to 3 liters per minute as long as my O2 levels don't drop too much?"  I advised patient that I would check with RB.   RB, please advise if you are ok with him decreasing down to 3L. Thanks!

## 2021-02-11 NOTE — Telephone Encounter (Signed)
Spoke to pt & he does use [facility].  He has [facility] and they go thru [employer].  He states the poc will go thru his Medicare.  I called [facility] back & spoke to [place].  She states [facility] has separate computer system and that's why he did come up in their system.  They do not have POC's though.  She states pt will need to go thru [employer].  I called pt back and explained this.  He states ok.  I will send POC order that was placed to [employer].  Will route back to triage so they can close MyChart message.

## 2021-02-11 NOTE — Telephone Encounter (Signed)
Mychart message sent by pt: Wollard, [facility] (supporting Byrum, Rose Fillers, MD) 16 hours ago (5:47 PM)   Hi.  I'm still confused about how I obtain a portable oxygen generator.  Does someone call me?  I called the number your office gave me and they said they don't deal with those.  Thanks in advance for your help.   Randy Greene   Pt was walked at last OV with RB 8/16 and order was placed during that visit. PCCs, please advise.

## 2021-02-11 NOTE — Telephone Encounter (Signed)
I called [facility] & spoke to Jeff.  She states they did not get order.  Pt lives in [place] and she states that is out of their service area.  Order stated to send to [facility] - pt is currently already on O2.  Will call pt to see where he currently gets O2.

## 2021-02-21 LAB — FUNGUS CULTURE WITH STAIN

## 2021-02-21 LAB — FUNGAL ORGANISM REFLEX

## 2021-02-21 LAB — FUNGUS CULTURE RESULT

## 2021-02-26 LAB — ACID FAST CULTURE WITH REFLEXED SENSITIVITIES (MYCOBACTERIA): Acid Fast Culture: NEGATIVE

## 2021-03-03 ENCOUNTER — Other Ambulatory Visit: Payer: Self-pay

## 2021-03-03 ENCOUNTER — Ambulatory Visit (HOSPITAL_COMMUNITY)
Admission: RE | Admit: 2021-03-03 | Discharge: 2021-03-03 | Disposition: A | Discharge status: Home / Self Care | Payer: No Typology Code available for payment source | Source: Ambulatory Visit | Attending: Emergency Medicine | Admitting: Emergency Medicine

## 2021-03-03 ENCOUNTER — Telehealth: Payer: Self-pay | Admitting: Emergency Medicine

## 2021-03-03 DIAGNOSIS — R9389 Abnormal findings on diagnostic imaging of other specified body structures: Secondary | ICD-10-CM | POA: Insufficient documentation

## 2021-03-03 DIAGNOSIS — R0602 Shortness of breath: Secondary | ICD-10-CM | POA: Diagnosis present

## 2021-03-03 NOTE — Telephone Encounter (Signed)
Called [facility] CT Dept and spoke with Alyse Low about the order that had been placed. Per [employer], they were able to get it figured out and that the order that was placed was the correct order. Nothing further needed.

## 2021-03-06 ENCOUNTER — Ambulatory Visit (INDEPENDENT_AMBULATORY_CARE_PROVIDER_SITE_OTHER): Payer: No Typology Code available for payment source | Admitting: Emergency Medicine

## 2021-03-06 ENCOUNTER — Other Ambulatory Visit: Payer: Self-pay

## 2021-03-06 ENCOUNTER — Encounter: Payer: Self-pay | Admitting: Emergency Medicine

## 2021-03-06 DIAGNOSIS — J9611 Chronic respiratory failure with hypoxia: Secondary | ICD-10-CM | POA: Diagnosis not present

## 2021-03-06 DIAGNOSIS — R9389 Abnormal findings on diagnostic imaging of other specified body structures: Secondary | ICD-10-CM | POA: Diagnosis not present

## 2021-03-06 DIAGNOSIS — J449 Chronic obstructive pulmonary disease, unspecified: Secondary | ICD-10-CM

## 2021-03-06 DIAGNOSIS — J8489 Other specified interstitial pulmonary diseases: Secondary | ICD-10-CM

## 2021-03-06 NOTE — Assessment & Plan Note (Signed)
Presumed due to his COVID-19 pneumonia in July.  There have been some question about possible aspiration pneumonitis or autoimmune process.  His autoimmune labs were reassuring, bronchoscopy was reassuring.  Repeat CT chest done this week shows marked improvement in his bilateral pulmonary infiltrates.

## 2021-03-06 NOTE — Assessment & Plan Note (Signed)
Needs to continue to practice swallowing precautions to avoid aspiration.  Discussed with him today.

## 2021-03-06 NOTE — Assessment & Plan Note (Signed)
Mild COPD.  Rare albuterol use.  I do not think we need to start scheduled bronchodilator therapy at this time.  Plan to continue same

## 2021-03-06 NOTE — Assessment & Plan Note (Signed)
Resolved.  He had a reassuring ambulatory oximetry at the [place] 2 weeks ago.  We can discontinue his home oxygen.

## 2021-03-06 NOTE — Assessment & Plan Note (Signed)
Coronary calcifications noted on his CT chest.  He is planning to be referred to cardiology for restratification by the [place] coming up soon.  He has small pulmonary nodules, less than 3 mm that should not need to be followed based on size criteria.

## 2021-03-06 NOTE — Addendum Note (Signed)
Addended by: Gavin Potters R on: 03/06/2021 11:47 AM   Modules accepted: Orders

## 2021-03-06 NOTE — Patient Instructions (Addendum)
We will stop your home oxygen Please keep your albuterol available use 2 puffs when needed for shortness of breath, chest tightness, wheezing. We will not start an every day inhaled medication at this time We reviewed your CT scan of the chest today there is been marked improvement compared with your hospitalization in July.  Most consistent with changes due to COVID-19 pneumonia.  There is a very small amount of residual scar left behind.  There are also small pulmonary nodules that should not need to be followed. Agree with getting a cardiology consultation to evaluate for possible coronary artery disease Continue to practice good swallowing precautions to avoid aspiration Follow with Randy Greene in 12 months or sooner if you have any problems.

## 2021-03-06 NOTE — Progress Notes (Signed)
Subjective:    Patient ID: Randy Greene, male    DOB: [birth year], 55 y.o.   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  Carries a history of asthma with reported normal P[facility] ([facility] Symbicort), also allergic rhinitis on cetirizine, Atrovent nasal spray but doesn' t use frequently, Singulair. Also has flonase that he never uses. His aspiration issues are much improved with rx of his GERD and d/c xanax.   Seen at the [place] he was seen at the [facility]/26/2020 at which time he was having increased wheeze for 2 to 3 months, stable cough and dyspnea.  Pulmonary function testing was obtained, unavailable to me at this time.  He also had a CT chest that was done on 09/12/2020 as below. He has albuterol that does seem to help him with exertional SOB, also some SOB at rest. Seemed to correspond with increased allergy season. Minimal cough. He hears wheeze daily for the last month - may be a bit better lately. Does respond to albuterol. No pred or abx since his aspiration improved.   Chest CT 09/12/2020 reviewed by me, shows small hiatal hernia, calcified granulomata in the medial right lower lobe, right hilar nodes as well as the spleen, some peribronchovascular nodular and groundglass opacity in the left upper and lower lobes, largest focus up to 10 mm, question some acute multifocal inflammatory or infectious process.  No effusions.  No other concerning nodules   ROV 11/27/20 --follow-up visit 55 year old gentleman, former smoker with a history of childhood asthma, MS with chronic aspiration, OSA on CPAP, GERD.  Also with an abnormal CT  with some scattered bilateral groundglass nodularity done 09/12/2020.  He has intermittent wheezing, benefits from albuterol. He activity is limited by his MS, ? Possibly also his breathing. He does still have some wheeze and SOB with exertion. He uses albuterol intermittently which does help him.  A lot of variability in the frequency.   Pulmonary function testing done today reviewed by me, shows restriction without a bronchodilator response, normal lung volumes, normal diffusion capacity.  Possibly small amount of curve to his flow volume loop that could suggest mild obstruction.  CT scan of the chest 10/30/2020 reviewed by me, shows a vague 2.5 mm subpleural right milligrams as pulmonary nodule, question lymph node.  Also some left upper lobe airspace opacity that appears to be postinflammatory, likely stable compared with 09/12/2020.   Hosp F/u visit  01/29/21 --55 year old man, former smoker with MS and chronic aspiration, OSA on CPAP, GERD.  No definite obstruction by PFT although possible curve of his flow-volume loop.  We have followed some left upper lobe and scattered inflammatory foci on CT chest, stable March/2022.  Unfortunately since our last visit he had COVID-19 6/27, admitted 7/20 for bacterial pneumonia and associated sepsis.  He had multifocal pulmonary infiltrates at that time and underwent bronchoscopy: Respiratory culture negative, AFB smear negative, fungal culture negative.  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  Discharged on O2 4 L/min.  Admitted 01/22/2021 with recurrent progressive dyspnea.  Treated again with corticosteroids, Bactrim prophylaxis.  No clear evidence that the pneumonitis is  associated with ocrelizumab (for his MS).  Remains on prednisone, planned 82m qd for at least a week. He is interested in a POC. No overt aspiration.   ANCA 01/14/2021 negative, ANA negative, aldolase slightly elevated at  12.3 MBS 01/24/21 - mild aspiration risk, precautions reviewed.   ROV 02/04/21 --follow-up visit for 55year old man with MS on ocrelizumab, chronic aspiration.  Also OSA on CPAP, GERD.  Possible mild obstruction based on curve of his flow-volume loop.  We have been following multifocal pulmonary infiltrates, suspected to be post COVID NSIP although etiology unclear.  Bronchoscopy without any evidence of opportunistic infection.  Swallow evaluation was overall reassuring.  My plan was to extend his prednisone dosing for another week, follow clinical status and then repeat his imaging. He returns today reporting.  He is interested in trying to wean his oxygen so he can qualify for POC and we can check this today. Good compliance with CPAP  ROV 9/ 15/22 --Randy Greene is a 89with multiple sclerosis on demand.  Complicated by chronic aspiration.  Also with a history of OSA on CPAP, GERD, mild obstructive disease based on his spirometry.  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  FOB by Dr. STamala Julian7/26/2022 did not show any evidence of superimposed infection and swallow evaluation was reassuring.  His ANCA, ANA were negative.  Discharged from the hospital on submental oxygen.  Good compliance with his CPAP.  Feels better - more active. Was able to mow his grass. [place] have his [place] d/c'd. He uses albuterol . Is not on a scheduled BD right now.   Repeat CT chest done on 03/03/2021 reviewed by me, shows overall resolution of extensive bilateral airspace disease seen on his prior CT from July.  He has residual bandlike scarring and some architectural distortion.  Stable left lower lobe 3 mm nodule, stable right lower lobe nodule. He did have coronary calcification.    Review of Systems As per HPi      Objective:   Physical Exam Vitals:   03/06/21 1021  BP: 138/74  Pulse: 90  Temp: 98 F (36.7 C)  TempSrc:  Oral  SpO2: 98%  Weight: 209 lb 12.8 oz (95.2 kg)  Height: _0  (1.88 m)    Gen: Pleasant, well-nourished, in no distress,  normal affect.   ENT: No lesions, oropharynx clear.  Strong voice.  No cough  Neck: No JVD, no stridor  Lungs: No use of accessory muscles, no crackles or wheezing on normal respiration, no wheeze on forced expiration  Cardiovascular: Distant RRR, heart sounds normal, no murmur or gallops, no peripheral edema  Musculoskeletal: No deformities, no cyanosis or clubbing  Neuro: alert, awake, non focal  Skin: Warm, no lesions or rash      Assessment & Plan:  NSIP (nonspecific interstitial pneumonitis) ([place] Presumed due to his COVID-19 pneumonia in July.  There have been some question about possible aspiration pneumonitis or autoimmune process.  His autoimmune labs were reassuring, bronchoscopy was reassuring.  Repeat CT chest done this week shows marked improvement in his bilateral pulmonary infiltrates.  Chronic hypoxemic respiratory failure (HCC) Resolved.  [place]  We can discontinue his home oxygen.  Recurrent aspiration bronchitis/pneumonia ([place] Needs to continue to practice swallowing precautions to avoid aspiration.  Discussed with him today.  COPD (chronic obstructive pulmonary disease) ([place]  Mild COPD.  Rare albuterol use.  I do not think we need to start scheduled bronchodilator therapy at this time.  Plan to continue same  Abnormal CT of the chest Coronary calcifications noted on his CT chest.  He is planning to be referred to cardiology for restratification by the [place] coming up soon.  He has small pulmonary nodules, less than 3 mm that should not need to be followed based on size criteria.  Time spent 40 minutes  Baltazar Apo, MD, PhD 03/06/2021, 11:03 AM [facility] [phone] or if no answer before 7:00PM call [phone] For any issues after 7:00PM please  call eLink [phone]

## 2021-04-10 ENCOUNTER — Other Ambulatory Visit: Payer: Self-pay

## 2021-04-10 ENCOUNTER — Encounter: Payer: Self-pay | Admitting: Physical Medicine & Rehabilitation

## 2021-04-10 ENCOUNTER — Encounter
Payer: No Typology Code available for payment source | Attending: Physical Medicine & Rehabilitation | Admitting: Physical Medicine & Rehabilitation

## 2021-04-10 VITALS — BP 152/88 | HR 82 | Temp 97.6°F | Ht 74.0 in | Wt 212.0 lb

## 2021-04-10 DIAGNOSIS — M47817 Spondylosis without myelopathy or radiculopathy, lumbosacral region: Secondary | ICD-10-CM

## 2021-04-10 NOTE — Patient Instructions (Signed)
You had a radio frequency procedure today This was done to alleviate joint pain in your lumbar area We injected lidocaine which is a local anesthetic.  You may experience soreness at the injection sites. You may also experienced some irritation of the nerves that were heated I'm recommending ice for 30 minutes every 2 hours as needed for the next 24-48 hours   

## 2021-04-10 NOTE — Progress Notes (Signed)
  PROCEDURE RECORD [facility]   Name: Randy Greene DOB:Sep 23, 1965 MRN: [id number]  Date:04/10/2021  Physician: Alysia Penna, MD    Nurse/CMA: Truman Hayward, CMA  Allergies:  Allergies  Allergen Reactions   Penicillins Hives and Other (See Comments)    [ +FAMILY HISTORY] Has patient had a PCN reaction causing immediate rash, facial/tongue/throat swelling, SOB or lightheadedness with hypotension: Unknown Has patient had a PCN reaction causing severe rash involving mucus membranes or skin necrosis: Unknown Has patient had a PCN reaction that required hospitalization: No Has patient had a PCN reaction occurring within the last 10 years: No If all of the above answers are "NO", then may proceed with Cephalosporin use.     Tape Rash   Zoloft [Sertraline] Rash   Food Nausea And Vomiting and Other (See Comments)    GREEN PEPPERS- flushed  PT DOES NOT EAT MEAT   Morphine Itching   Mushroom Extract Complex Nausea And Vomiting and Other (See Comments)    And flushed   Morphine And Related Itching   Penicillin G Rash    Consent Signed: Yes.    Is patient diabetic? No.  CBG today? N/A  Pregnant: No. LMP: No LMP for male patient. (age 58-55)  Anticoagulants: no Anti-inflammatory: no Antibiotics: no  Procedure: Left Lumbar L3,4,5 Radiofrequency Position: Prone Start Time: 3:05 pm  End Time: 3:18 pm  Fluoro Time: 32  RN/CMA Marvis Moeller, CMA    Time 2:35 am 3:20 pm    BP  152/88 165/107    Pulse 82 79    Respirations 16 16    O2 Sat 98 97    S/S 6 6    Pain Level 8/10 4/10     D/C home with Self, patient A & O X 3, D/C instructions reviewed, and sits independently.

## 2021-04-10 NOTE — Progress Notes (Signed)
Left L5 dorsal ramus., left L4 and left L3 medial branch radio frequency neurotomy under fluoroscopic guidance  Indication: Low back pain due to lumbar spondylosis which has been relieved on 2 occasions by greater than 50% by lumbar medial branch blocks at corresponding levels.  Informed consent was obtained after describing risks and benefits of the procedure with the patient, this includes bleeding, bruising, infection, paralysis and medication side effects. The patient wishes to proceed and has given written consent. The patient was placed in a prone position. The lumbar and sacral area was marked and prepped with Betadine. A 25-gauge 1-1/2 inch needle was inserted into the skin and subcutaneous tissue at 3 sites in one ML of 2% lidocaine was injected into each site. Then a 18-gauge 10 cm radio frequency needle with a 1 cm curved active tip was inserted targeting the left S1 SAP/sacral ala junction. Bone contact was made and confirmed with lateral imaging.  motor stimulation at 2 Hz confirm proper needle location followed by injection of one ML of 2% MPF lidocaine. Then the left L5 SAP/transverse process junction was targeted. Bone contact was made and confirmed with lateral imaging motor stimulation at 2 Hz confirm proper needle location followed by injection of one ML of the solution containing one ML of  2% MPF lidocaine. Then the left L4 SAP/transverse process junction was targeted. Bone contact was made and confirmed with lateral imaging. motor stimulation at 2 Hz confirm proper needle location followed by injection of one ML of the solution containing one ML of2% MPF lidocaine. Radio frequency lesion  at [facility] for 90 seconds was performed. Needles were removed. Post procedure instructions and vital signs were performed. Patient tolerated procedure well. Followup appointment was given.

## 2021-05-29 ENCOUNTER — Encounter
Payer: No Typology Code available for payment source | Attending: Physical Medicine & Rehabilitation | Admitting: Physical Medicine & Rehabilitation

## 2021-05-29 DIAGNOSIS — M47817 Spondylosis without myelopathy or radiculopathy, lumbosacral region: Secondary | ICD-10-CM | POA: Insufficient documentation

## 2021-05-30 ENCOUNTER — Ambulatory Visit: Payer: No Typology Code available for payment source | Admitting: Physical Medicine & Rehabilitation

## 2021-05-30 ENCOUNTER — Encounter: Payer: No Typology Code available for payment source | Admitting: Physical Medicine & Rehabilitation

## 2021-06-06 ENCOUNTER — Other Ambulatory Visit: Payer: Self-pay

## 2021-06-06 ENCOUNTER — Encounter (HOSPITAL_BASED_OUTPATIENT_CLINIC_OR_DEPARTMENT_OTHER): Payer: No Typology Code available for payment source | Admitting: Physical Medicine & Rehabilitation

## 2021-06-06 ENCOUNTER — Encounter: Payer: Self-pay | Admitting: Physical Medicine & Rehabilitation

## 2021-06-06 VITALS — BP 145/89 | HR 95 | Temp 98.0°F | Ht 74.0 in | Wt 212.0 lb

## 2021-06-06 DIAGNOSIS — M47817 Spondylosis without myelopathy or radiculopathy, lumbosacral region: Secondary | ICD-10-CM | POA: Diagnosis not present

## 2021-06-06 NOTE — Progress Notes (Signed)

## 2021-06-06 NOTE — Patient Instructions (Signed)
You had a radio frequency procedure today This was done to alleviate joint pain in your lumbar area We injected lidocaine which is a local anesthetic.  You may experience soreness at the injection sites. You may also experienced some irritation of the nerves that were heated I'm recommending ice for 30 minutes every 2 hours as needed for the next 24-48 hours   

## 2021-06-06 NOTE — Progress Notes (Signed)
°  PROCEDURE RECORD [facility]   Name: Randy Greene DOB:[birth year] MRN: [id number]  Date:06/06/2021  Physician: Alysia Penna, MD    Nurse/CMA: Jorja Loa MA  Allergies:  Allergies  Allergen Reactions   Penicillins Hives and Other (See Comments)    [ +FAMILY HISTORY] Has patient had a PCN reaction causing immediate rash, facial/tongue/throat swelling, SOB or lightheadedness with hypotension: Unknown Has patient had a PCN reaction causing severe rash involving mucus membranes or skin necrosis: Unknown Has patient had a PCN reaction that required hospitalization: No Has patient had a PCN reaction occurring within the last 10 years: No If all of the above answers are "NO", then may proceed with Cephalosporin use.     Tape Rash   Zoloft [Sertraline] Rash   Food Nausea And Vomiting and Other (See Comments)    GREEN PEPPERS- flushed  PT DOES NOT EAT MEAT   Morphine Itching   Mushroom Extract Complex Nausea And Vomiting and Other (See Comments)    And flushed   Morphine And Related Itching   Penicillin G Rash    Consent Signed: Yes.    Is patient diabetic? No.  CBG today? N/A  Pregnant: No. LMP: No LMP for male patient. (age 57-55)  Anticoagulants: no Anti-inflammatory: no Antibiotics: no  Procedure: Right L3,4,5 Radiofrequency  Position: Prone Start Time: 11:00 AM  End Time: 11:18 AM Fluoro Time: 40  RN/CMA Geovonni Meyerhoff MA Nasira Janusz MA    Time 10:45 AM 11:21 AM    BP 148/89 138/94    Pulse 95 92    Respirations 16 16    O2 Sat 97 97    S/S 6 6    Pain Level 7/10 3/10     D/C home with Self, patient A & O X 3, D/C instructions reviewed, and sits independently.         Subjective:    Patient ID: Randy Greene, male    DOB: 1966/06/20, 55 y.o.   MRN: [id number]  HPI    Review of Systems     Objective:   Physical Exam        Assessment & Plan:

## 2021-09-04 ENCOUNTER — Encounter: Payer: Self-pay | Admitting: Physical Medicine & Rehabilitation

## 2021-09-04 ENCOUNTER — Encounter: Payer: Medicare Other | Attending: Physical Medicine & Rehabilitation | Admitting: Physical Medicine & Rehabilitation

## 2021-09-04 ENCOUNTER — Other Ambulatory Visit: Payer: Self-pay

## 2021-09-04 VITALS — BP 155/95 | HR 92 | Temp 97.6°F | Ht 74.0 in | Wt 219.6 lb

## 2021-09-04 DIAGNOSIS — G894 Chronic pain syndrome: Secondary | ICD-10-CM

## 2021-09-04 NOTE — Progress Notes (Signed)
? ?Subjective:  ? ? Patient ID: Randy Greene, male    DOB: 1965-09-05, 56 y.o.   MRN: [id number] ? ?HPI ? ?56 yo male with MS who has chronic low back pain due to Lumbar spondylosis who is complaining about increasing Left sided low back pain aggravated by standing and walking .  No falls or new trauma. ? ?Pt cont to do Yoga and Tai Chi ? ?Left L3-4-5 RF starting to wear off ~66mo post procedure ? ?RIght L3-4-5 RF still effective 3 mo post procedure ? ?[facility] be [facility] by this office, he has no history of drug abuse, has been on tramadol 50 mg 3 times daily through the [place]. ?Allergy to morphine as well as Zoloft causing rash or itching but has tolerated tramadol without issues ? ?Pain Inventory ?Average Pain 4 ?Pain Right Now 4 ?My pain is sharp, burning, and tingling ? ?In the last 24 hours, has pain interfered with the following? ?General activity 4 ?Relation with others 4 ?Enjoyment of life 4 ?What TIME of day is your pain at its worst? evening and night ?Sleep (in general) NA ? ?Pain is worse with: bending, inactivity, and some activites ?Pain improves with: heat/ice, therapy/exercise, and medication ?Relief from Meds:  na ? ?Family History  ?Problem Relation Age of Onset  ? Dementia Mother   ? Hypertension Father   ? Heart attack Father   ? ?Social History  ? ?Socioeconomic History  ? Marital status: Single  ?  Spouse name: Not on file  ? Number of children: Not on file  ? Years of education: Not on file  ? Highest education level: Not on file  ?Occupational History  ? Not on file  ?Tobacco Use  ? Smoking status: Former  ?  Packs/day: 1.00  ?  Years: 20.00  ?  Pack years: 20.00  ?  Types: Cigarettes  ?  Quit date: 11/02/2006  ?  Years since quitting: 14.8  ? Smokeless tobacco: Never  ?Vaping Use  ? Vaping Use: Never used  ?Substance and Sexual Activity  ? Alcohol use: Yes  ? Drug use: No  ?  Comment: medical marijuana  ? Sexual activity: Never  ?Other Topics Concern  ? Not on file  ?Social  History Narrative  ? Not on file  ? ?Social Determinants of Health  ? ?Financial Resource Strain: Not on file  ?Food Insecurity: Not on file  ?Transportation Needs: Not on file  ?Physical Activity: Not on file  ?Stress: Not on file  ?Social Connections: Not on file  ? ?Past Surgical History:  ?Procedure Laterality Date  ? ARTHROSCOPIC    ? BRONCHIAL WASHINGS  01/14/2021  ? Procedure: BRONCHIAL WASHINGS;  Surgeon: Candee Furbish, MD;  Location[facility] ENDOSCOPY;  Service: Pulmonary;;  ? HERNIA REPAIR    ? INCISION AND DRAINAGE Left 01/04/2017  ? Procedure: INCISION AND DRAINAGE LEFT KNEE;  Surgeon: Rod Can, MD;  Location: [place];  Service: Orthopedics;  Laterality: Left;  ? jawsurgery    ? KNEE ARTHROPLASTY Left 08/10/2016  ? Procedure: LEFT TOTAL KNEE ARTHROPLASTY WITH COMPUTER NAVIGATION;  Surgeon: Rod Can, MD;  Location: [place];  Service: Orthopedics;  Laterality: Left;  ? knees scope    ? VIDEO BRONCHOSCOPY Bilateral 01/14/2021  ? Procedure: VIDEO BRONCHOSCOPY WITHOUT FLUORO;  Surgeon: Candee Furbish, MD;  Location: [facility] ENDOSCOPY;  Service: Pulmonary;  Laterality: Bilateral;  ? ?Past Surgical History:  ?Procedure Laterality Date  ? ARTHROSCOPIC    ? BRONCHIAL  WASHINGS  01/14/2021  ? Procedure: BRONCHIAL WASHINGS;  Surgeon: Candee Furbish, MD;  Location: [facility] ENDOSCOPY;  Service: Pulmonary;;  ? HERNIA REPAIR    ? INCISION AND DRAINAGE Left 01/04/2017  ? Procedure: INCISION AND DRAINAGE LEFT KNEE;  Surgeon: Rod Can, MD;  Location: [place];  Service: Orthopedics;  Laterality: Left;  ? jawsurgery    ? KNEE ARTHROPLASTY Left 08/10/2016  ? Procedure: LEFT TOTAL KNEE ARTHROPLASTY WITH COMPUTER NAVIGATION;  Surgeon: Rod Can, MD;  Location: [place];  Service: Orthopedics;  Laterality: Left;  ? knees scope    ? VIDEO BRONCHOSCOPY Bilateral 01/14/2021  ? Procedure: VIDEO BRONCHOSCOPY WITHOUT FLUORO;  Surgeon: Candee Furbish, MD;  Location: [facility] ENDOSCOPY;  Service: Pulmonary;  Laterality: Bilateral;  ? ?Past  Medical History:  ?Diagnosis Date  ? Acid reflux   ? Arthritis   ? Aspiration pneumonia ([place])   ? Asthma   ? Delayed wound healing   ? left knee  ? Dyspnea   ? Enlarged prostate   ? Hypercapnic respiratory failure, chronic ([place])   ? Hypertension   ? MS (multiple sclerosis) ([place])   ? ?BP (!) 155/95   Pulse 92   Temp 97.6 ?F (36.4 ?C)   Ht _0  (1.88 m)   Wt 219 lb 9.6 oz (99.6 kg)   SpO2 96%   BMI 28.19 kg/m?  ? ?Opioid Risk Score:   ?Fall Risk Score:  `1 ? ?Depression screen PHQ 2/9 ? ?Depression screen [facility] 2/9 09/04/2021 06/06/2021 04/10/2021 02/04/2021 09/03/2020 02/20/2020 10/26/2019  ?Decreased Interest 0 0 0 0 0 0 0  ?Down, Depressed, Hopeless 0 0 0 0 - 0 0  ?PHQ - 2 Score 0 0 0 0 0 0 0  ?Altered sleeping - - - - - - -  ?Tired, decreased energy - - - - - - -  ?Change in appetite - - - - - - -  ?Feeling bad or failure about yourself  - - - - - - -  ?Trouble concentrating - - - - - - -  ?Moving slowly or fidgety/restless - - - - - - -  ?Suicidal thoughts - - - - - - -  ?PHQ-9 Score - - - - - - -  ?Difficult doing work/chores - - - - - - -  ?  ? ?Review of Systems  ?Constitutional: Negative.   ?HENT: Negative.    ?Eyes: Negative.   ?Respiratory: Negative.    ?Cardiovascular: Negative.   ?Gastrointestinal: Negative.   ?Endocrine: Negative.   ?Genitourinary: Negative.   ?Musculoskeletal:  Positive for back pain and gait problem.  ?     Leg pain  ?Skin: Negative.   ?Allergic/Immunologic: Negative.   ?Hematological: Negative.   ?Psychiatric/Behavioral: Negative.    ?All other systems reviewed and are negative. ? ?   ?Objective:  ? Physical Exam ?Vitals and nursing note reviewed.  ?Constitutional:   ?   Appearance: He is normal weight.  ?HENT:  ?   Head: Normocephalic and atraumatic.  ?Eyes:  ?   Extraocular Movements: Extraocular movements intact.  ?   Conjunctiva/sclera: Conjunctivae normal.  ?   Pupils: Pupils are equal, round, and reactive to light.  ?Musculoskeletal:  ?   Comments: Patient with tenderness to  palpation left lumbar paraspinal also with pain during lumbar extension in the same area.  No pain with lumbar flexion. ?Negative straight leg raising.  No pain over PSIS  ?Skin: ?   General: Skin is warm and dry.  ?  Neurological:  ?   Mental Status: He is alert and oriented to person, place, and time.  ?   Comments: Motor strength is 5/5 bilateral hip flexor knee extensor ankle dorsiflexor.  ?Psychiatric:     ?   Mood and Affect: Mood normal.     ?   Behavior: Behavior normal.  ? ? ? ? ? ?   ?Assessment & Plan:  ? ?#1.  Chronic low back pain lumbar spondylosis.  He has had improvements with bilateral L3-4-5 medial branch blocks greater than 50% x 2 occasions as well as bilateral radiofrequency neurotomies of the same nerves.  The left side was done 5 months ago and the right side was done approximately 3 months ago.  Right side is starting to wear off and patient will be scheduled for repeat procedure in 1 month.  Discussed with patient agrees with plan. ?In regards to his tramadol we will need to get urine drug testing prior to initiating treatment.  We will monitor PDMP as well as pill counts.  He is on Marinol for MS related spasticity therefore THC will be positive. ?

## 2021-09-11 LAB — TOXASSURE SELECT 13 (MW), URINE

## 2021-09-17 ENCOUNTER — Telehealth: Payer: Self-pay | Admitting: *Deleted

## 2021-09-17 NOTE — Telephone Encounter (Signed)
Urine drug screen for this encounter is consistent for prescribed medication. THC  is positive from prescribed Marinol.  ?

## 2021-10-21 ENCOUNTER — Encounter
Payer: No Typology Code available for payment source | Attending: Physical Medicine & Rehabilitation | Admitting: Physical Medicine & Rehabilitation

## 2021-10-21 ENCOUNTER — Encounter: Payer: Self-pay | Admitting: Physical Medicine & Rehabilitation

## 2021-10-21 VITALS — BP 153/105 | HR 86 | Ht 74.0 in | Wt 221.0 lb

## 2021-10-21 DIAGNOSIS — M47817 Spondylosis without myelopathy or radiculopathy, lumbosacral region: Secondary | ICD-10-CM | POA: Insufficient documentation

## 2021-10-21 MED ORDER — TRAMADOL HCL 50 MG PO TABS: 50.0000 mg | ORAL_TABLET | Freq: Two times a day (BID) | ORAL | 5 refills | Status: DC

## 2021-10-21 NOTE — Patient Instructions (Signed)
You had a radio frequency procedure today This was done to alleviate joint pain in your lumbar area We injected lidocaine which is a local anesthetic.  You may experience soreness at the injection sites. You may also experienced some irritation of the nerves that were heated I'm recommending ice for 30 minutes every 2 hours as needed for the next 24-48 hours   

## 2021-10-21 NOTE — Progress Notes (Signed)
Left L5 dorsal ramus., left L4 and left L3 medial branch radio frequency neurotomy under fluoroscopic guidance ? ?Indication: Low back pain due to lumbar spondylosis which has been relieved on 2 occasions by greater than 50% by lumbar medial branch blocks at corresponding levels. ? ?Informed consent was obtained after describing risks and benefits of the procedure with the patient, this includes bleeding, bruising, infection, paralysis and medication side effects. The patient wishes to proceed and has given written consent. The patient was placed in a prone position. The lumbar and sacral area was marked and prepped with Betadine. A 25-gauge 1-1/2 inch needle was inserted into the skin and subcutaneous tissue at 3 sites in one ML of 2% lidocaine was injected into each site. Then a 18-gauge 10 cm radio frequency needle with a 1 cm curved active tip was inserted targeting the left S1 SAP/sacral ala junction. Bone contact was made and confirmed with lateral imaging.  motor stimulation at 2 Hz confirm proper needle location followed by injection of one ML of 2% MPF lidocaine. Then the left L5 SAP/transverse process junction was targeted. Bone contact was made and confirmed with lateral imaging motor stimulation at 2 Hz confirm proper needle location followed by injection of one ML of the solution containing one ML of  2% MPF lidocaine. Then the left L4 SAP/transverse process junction was targeted. Bone contact was made and confirmed with lateral imaging. motor stimulation at 2 Hz confirm proper needle location followed by injection of one ML of the solution containing one ML of2% MPF lidocaine. Radio frequency lesion  at 52?C for 90 seconds was performed. Needles were removed. Post procedure instructions and vital signs were performed. Patient tolerated procedure well. Followup appointment was given. ? ?

## 2021-10-21 NOTE — Progress Notes (Signed)
?  PROCEDURE RECORD ?[facility] ? ? ?Name: Randy Greene ?DOB:02-03-1966 ?MRN: [id number] ? ?Date:10/21/2021  Physician: Alysia Penna, MD   ? ?Nurse/CMA: Jorja Loa MA ? ?Allergies:  ?Allergies  ?Allergen Reactions  ? Penicillins Hives and Other (See Comments)  ?  [ +FAMILY HISTORY] ?Has patient had a PCN reaction causing immediate rash, facial/tongue/throat swelling, SOB or lightheadedness with hypotension: Unknown ?Has patient had a PCN reaction causing severe rash involving mucus membranes or skin necrosis: Unknown ?Has patient had a PCN reaction that required hospitalization: No ?Has patient had a PCN reaction occurring within the last 10 years: No ?If all of the above answers are "NO", then may proceed with Cephalosporin use. ? ?  ? Tape Rash  ? Zoloft [Sertraline] Rash  ? Food Nausea And Vomiting and Other (See Comments)  ?  GREEN PEPPERS- flushed ? ?PT DOES NOT EAT MEAT  ? Morphine Itching  ? Morphine And Related Itching  ? Mushroom Extract Complex Nausea And Vomiting and Other (See Comments)  ?  And flushed ?Other reaction(s): Other (See Comments) ?And flushed  ? Penicillin G Rash  ? ? ?Consent Signed: No.  Is patient diabetic? No.  CBG today? . ? ?Pregnant: No. LMP: No LMP for male patient. (age 4-55) ? ?Anticoagulants: no ?Anti-inflammatory: no ?Antibiotics: no ? ?Procedure: Left 3,4,5 Radiofrequency  Position: Prone ?Start Time: 12:47 pm  End Time: 1:04 pm  Fluoro Time: 53 ? ?RN/CMA Gwyndolyn Saxon MA Jerline Pain MA   ?Time 12:24 pm 1:10 pm 1:15 pm   ?BP 144/99 154/94 155/95   ?Pulse 86 72    ?Respirations 16 16    ?O2 Sat 96 96    ?S/S 6 6    ?Pain Level 6/10 0/10    ? ?D/C home with Self, patient A & O X 3, D/C instructions reviewed, and sits independently. ? ? ? ? ? ? ? ?

## 2021-12-09 ENCOUNTER — Encounter
Payer: No Typology Code available for payment source | Attending: Physical Medicine & Rehabilitation | Admitting: Physical Medicine & Rehabilitation

## 2021-12-09 ENCOUNTER — Encounter: Payer: Self-pay | Admitting: Physical Medicine & Rehabilitation

## 2021-12-09 VITALS — BP 132/93 | HR 80 | Ht 74.0 in | Wt 213.2 lb

## 2021-12-09 DIAGNOSIS — M47817 Spondylosis without myelopathy or radiculopathy, lumbosacral region: Secondary | ICD-10-CM | POA: Diagnosis present

## 2021-12-09 NOTE — Progress Notes (Signed)
  PROCEDURE RECORD [name]   Name: Randy Greene DOB:1965/09/09 MRN: [id number]  Date:12/09/2021  Physician: Alysia Penna, MD    Nurse/CMA: Ronnald Ramp, RMA  Allergies:  Allergies  Allergen Reactions   Penicillins Hives and Other (See Comments)    [ +FAMILY HISTORY] Has patient had a PCN reaction causing immediate rash, facial/tongue/throat swelling, SOB or lightheadedness with hypotension: Unknown Has patient had a PCN reaction causing severe rash involving mucus membranes or skin necrosis: Unknown Has patient had a PCN reaction that required hospitalization: No Has patient had a PCN reaction occurring within the last 10 years: No If all of the above answers are "NO", then may proceed with Cephalosporin use.     Tape Rash   Zoloft [Sertraline] Rash   Food Nausea And Vomiting and Other (See Comments)    GREEN PEPPERS- flushed  PT DOES NOT EAT MEAT   Morphine Itching   Morphine And Related Itching   Mushroom Extract Complex Nausea And Vomiting and Other (See Comments)    And flushed Other reaction(s): Other (See Comments) And flushed   Penicillin G Rash    Consent Signed: Yes.    Is patient diabetic? No.  CBG today? .  Pregnant: No. LMP: No LMP for male patient. (age 56-55)  Anticoagulants: no Anti-inflammatory: no Antibiotics: no  Procedure: Right L3-4-5 Radiofrequency  Position: Prone Start Time: 1:46 pm  End Time: 1:58 pm  Fluoro Time: 48  RN/CMA Laynee Lockamy, RMA Lee, CMA    Time 1:25pm 2:05 pm    BP 132/93 127/78    Pulse 80 78    Respirations 16 16    O2 Sat 96 94    S/S 6 6    Pain Level 4/10 4/10     D/C home with no one, patient A & O X 3, D/C instructions reviewed, and sits independently.

## 2021-12-09 NOTE — Progress Notes (Signed)

## 2021-12-20 ENCOUNTER — Encounter (HOSPITAL_COMMUNITY): Payer: Self-pay | Admitting: *Deleted

## 2021-12-20 ENCOUNTER — Ambulatory Visit (HOSPITAL_COMMUNITY)
Admission: EM | Admit: 2021-12-20 | Discharge: 2021-12-20 | Disposition: A | Discharge status: Home / Self Care | Payer: No Typology Code available for payment source | Attending: Physician Assistant | Admitting: Physician Assistant

## 2021-12-20 ENCOUNTER — Other Ambulatory Visit: Payer: Self-pay

## 2021-12-20 DIAGNOSIS — Z20828 Contact with and (suspected) exposure to other viral communicable diseases: Secondary | ICD-10-CM | POA: Diagnosis not present

## 2021-12-20 DIAGNOSIS — M109 Gout, unspecified: Secondary | ICD-10-CM

## 2021-12-20 LAB — POCT INFECTIOUS MONO SCREEN, ED / UC: Mono Screen: NEGATIVE

## 2021-12-20 MED ORDER — INDOMETHACIN 50 MG PO CAPS: 50.0000 mg | ORAL_CAPSULE | Freq: Two times a day (BID) | ORAL | 0 refills | Status: DC

## 2021-12-20 NOTE — ED Provider Notes (Signed)
Randy Greene    CSN: [id number] Arrival date & time: 12/20/21  1304      History   Chief Complaint Chief Complaint  Patient presents with   Toe Pain    HPI Leonid Manus is a 56 y.o. male.   Patient presents today with a 2-day history of right great toe pain.  He has a history of gout typically in this joint and states current symptoms are similar previous episodes of this condition.  He reports that pain is rated 8 on a 0-10 pain scale, described as sharp, worse with attempted ambulation or palpation, no alleviating factors identified.  He does report eating some change when he typically follows a vegan diet but denies additional dietary discretions.  Denies any additional medication changes.  He has taken 2 doses of indomethacin he had leftover from a previous prescription which provided relief but not resolution of symptoms.  He is now out of this medication and requesting refill if appropriate.  He denies any increase alcohol consumption.  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  Symptoms are at baseline.  In addition, patient is requesting testing for mono.  Reports that his 5 year old daughter recently tested positive for mono and he has had some sore throat, swollen lymph nodes, fatigue.  He denies any fever, nausea, vomiting, abdominal pain, bruising.  He has not tried any over-the-counter medication for symptom management.    Past Medical History:  Diagnosis Date   Acid reflux    Arthritis    Aspiration pneumonia (HCC)    Asthma    Delayed wound healing    left knee   Dyspnea    Enlarged prostate    Hypercapnic respiratory failure, chronic (HCC)    Hypertension    MS (multiple sclerosis) ([place])     Patient Active Problem List   Diagnosis Date Noted   COPD (chronic obstructive pulmonary disease) ([place]) 03/06/2021   Acute on chronic respiratory failure with hypoxia  ([place]) 01/23/2021   NSIP (nonspecific interstitial pneumonitis) ([place]) 01/23/2021   Abnormal CT of the chest 10/22/2020   Degenerative arthritis of left knee 08/11/2016   Osteoarthritis of left knee 08/10/2016   Agitation 03/01/2014   Chronic hypoxemic respiratory failure ([place]) 79/39/0300   Metabolic alkalosis with chronic respiratory acidosis 01/01/2014   OSA on CPAP 01/01/2014   Acid reflux 07/17/2013   Transaminitis 11/29/2012   MS (multiple sclerosis) ([place]) 11/29/2012   Recurrent aspiration bronchitis/pneumonia 11/30/2011    Past Surgical History:  Procedure Laterality Date   ARTHROSCOPIC     BRONCHIAL WASHINGS  01/14/2021   Procedure: BRONCHIAL WASHINGS;  Surgeon: Candee Furbish, MD;  Location: [facility] ENDOSCOPY;  Service: Pulmonary;;   HERNIA REPAIR     INCISION AND DRAINAGE Left 01/04/2017   Procedure: INCISION AND DRAINAGE LEFT KNEE;  Surgeon: Rod Can, MD;  Location: [facility];  Service: Orthopedics;  Laterality: Left;   jawsurgery     KNEE ARTHROPLASTY Left 08/10/2016   Procedure: LEFT TOTAL KNEE ARTHROPLASTY WITH COMPUTER NAVIGATION;  Surgeon: Rod Can, MD;  Location: [facility];  Service: Orthopedics;  Laterality: Left;   knees scope     VIDEO BRONCHOSCOPY Bilateral 01/14/2021   Procedure: VIDEO BRONCHOSCOPY WITHOUT FLUORO;  Surgeon: Candee Furbish, MD;  Location: [facility] ENDOSCOPY;  Service: Pulmonary;  Laterality: Bilateral;       Home Medications    Prior to Admission medications  Medication Sig Start Date End Date Taking? Authorizing Provider  indomethacin (INDOCIN) 50 MG capsule Take 1 capsule (50 mg total) by mouth 2 (two) times daily with a meal. 12/20/21  Yes Haifa Hatton K, PA-C  Acetylcysteine 600 MG CAPS TAKE ONE CAPSULE BY MOUTH DAILY TO TREAT SYMPTOMS OF BRAIN FOG 10/06/21   [provider]  albuterol (PROVENTIL HFA;VENTOLIN HFA) 108 (90 BASE) MCG/ACT inhaler Inhale 2 puffs into the lungs every 6 (six) hours as needed for wheezing or shortness of breath.     [provider]  alfuzosin (UROXATRAL) 10 MG 24 hr tablet Take 10 mg by mouth daily with breakfast.    [provider]  amphetamine-dextroamphetamine (ADDERALL XR) 30 MG 24 hr capsule Take 1 capsule by mouth 2 (two) times daily. 09/30/21   [provider]  aspirin EC 81 MG tablet Take 81 mg by mouth at bedtime. Swallow whole.    [provider]  baclofen (LIORESAL) 10 MG tablet Take 15 mg by mouth 4 (four) times daily.     [provider]  calcium carbonate (OS-CAL) 1250 (500 Ca) MG chewable tablet CHEW ONE TABLET BY MOUTH TWICE A DAY FOR SPASMS 02/04/21   [provider]  calcium carbonate (OS-CAL) 1250 (500 Ca) MG chewable tablet CHEW ONE TABLET BY MOUTH TWICE A DAY FOR SPASMS 07/24/21   [provider]  Cholecalciferol (VITAMIN D) 50 MCG (2000 UT) tablet Take 2,000 Units by mouth daily.    [provider]  cyanocobalamin (,VITAMIN B-12,) 1000 MCG/ML injection INJECT 1 ML (1000MCG) INTRAMUSCULARLY EVERY 28 DAYS 02/04/21   [provider]  cyclobenzaprine (FLEXERIL) 10 MG tablet TAKE ONE-HALF TABLET BY MOUTH THREE TIMES A DAY AS NEEDED FOR BACK PAIN 06/02/21   [provider]  diclofenac Sodium (VOLTAREN) 1 % GEL Apply 2 g topically 2 (two) times daily as needed (pain). 11/27/19   [provider]  donepezil (ARICEPT) 10 MG tablet TAKE ONE TABLET BY MOUTH DAILY "ARICEPT" TO HELP MEMORY 07/24/21   [provider]  dronabinol (MARINOL) 2.5 MG capsule Take 7.5 mg by mouth 3 (three) times daily.     [provider]  esomeprazole (NEXIUM) 40 MG capsule Take 40 mg by mouth 2 (two) times daily before a meal.    [provider]  famotidine (PEPCID) 40 MG tablet Take 40 mg by mouth at bedtime.    [provider]  finasteride (PROSCAR) 5 MG tablet Take 5 mg by mouth daily.    [provider]  fluorouracil (EFUDEX) 5 % cream Apply 1 application topically 2 (two) times daily.     [provider]  fluticasone-salmeterol (ADVAIR) 250-50 MCG/ACT AEPB Inhale 1 puff into the lungs in the morning and at bedtime.    [provider]  Folic Acid-Vit L2-GMW N02 (FOLBEE) 2.5-25-1 MG TABS tablet Take 1 tablet by mouth daily.    [provider]  guanFACINE (TENEX) 1 MG tablet TAKE TWO TABLETS BY MOUTH AT BEDTIME TO TREAT SYMPTOMS OF BRAIN FOG 10/06/21   [provider]  ketotifen (ZADITOR) 0.025 % ophthalmic solution Place 1 drop into both eyes 2 (two) times daily as needed (itching).    [provider]  magnesium (MAGTAB) 84 MG (7MEQ) TBCR SR tablet TAKE ONE TABLET BY MOUTH DAILY (APPROVED. TAKE IN PLACE OF MAGNESIUM OXIDE) 07/24/21   [provider]  metoCLOPramide (REGLAN) 10 MG tablet Take 1 tablet by mouth at bedtime. 07/24/21   [provider]  montelukast (  SINGULAIR) 10 MG tablet Take 10 mg by mouth daily.    [provider]  naloxone [facility]) nasal spray 4 mg/0.1 mL Place 1 spray into the nose See admin instructions. SPRAY 1 SPRAY INTO ONE NOSTRIL AS DIRECTED FOR OPIOID OVERDOSE (TURN PERSON ON SIDE AFTER DOSE. IF NO RESPONSE IN 2-3 MINUTES OR PERSON RESPONDS BUT RELAPSES, REPEAT USING A NEW SPRAY DEVICE AND SPRAY INTO THE OTHER NOSTRIL. CALL 911 AFTER USE.) * EMERGENCY USE ONLY * 01/16/20   [provider]  Omega-3 Fatty Acids (FISH OIL PO) Take 1 capsule by mouth daily.    [provider]  phenytoin (DILANTIN) 100 MG ER capsule Take 100 mg by mouth 2 (two) times daily. 07/18/20   [provider]  pravastatin (PRAVACHOL) 10 MG tablet TAKE ONE TABLET BY MOUTH AT BEDTIME FOR CHOLESTEROL 03/03/21   [provider]  pregabalin (LYRICA) 150 MG capsule Take 150 mg by mouth See admin instructions. 150mg  am, 150mg  noon, and 300mg  qhs.    [provider]  Probiotic Product (ALIGN) 4 MG CAPS Take 4 mg by mouth in the morning and at bedtime.    [provider]  traMADol  (ULTRAM) 50 MG tablet Take 1 tablet (50 mg total) by mouth 2 (two) times daily. 10/21/21   Kirsteins, Luanna Salk, MD  vitamin C (ASCORBIC ACID) 500 MG tablet Take 500 mg by mouth daily.    [provider]  vitamin E 180 MG (400 UNITS) capsule Take 400 Units by mouth daily.    [provider]  Zinc 50 MG TABS Take 50 mg by mouth every other day.    [provider]    Family History Family History  Problem Relation Age of Onset   Dementia Mother    Hypertension Father    Heart attack Father     Social History Social History   Tobacco Use   Smoking status: Former    Packs/day: 1.00    Years: 20.00    Total pack years: 20.00    Types: Cigarettes    Quit date: 11/02/2006    Years since quitting: 15.1   Smokeless tobacco: Never  Vaping Use   Vaping Use: Never used  Substance Use Topics   Alcohol use: Yes   Drug use: No    Comment: medical marijuana     Allergies   Penicillins, Tape, Zoloft [sertraline], Food, Morphine, Morphine and related, Mushroom extract complex, and Penicillin g   Review of Systems Review of Systems  Constitutional:  Positive for activity change and fatigue. Negative for appetite change and fever.  HENT:  Positive for sore throat. Negative for congestion, sinus pressure and sneezing.   Respiratory:  Negative for cough and shortness of breath.   Cardiovascular:  Negative for chest pain.  Gastrointestinal:  Negative for abdominal pain, diarrhea, nausea and vomiting.  Musculoskeletal:  Positive for arthralgias, gait problem and joint swelling. Negative for myalgias.  Skin:  Positive for color change.  Neurological:  Negative for dizziness, light-headedness and headaches.     Physical Exam Triage Vital Signs ED Triage Vitals  Enc Vitals Group     BP 12/20/21 1442 (!) 155/99     Pulse Rate 12/20/21 1442 76     Resp 12/20/21 1442 18     Temp 12/20/21 1442 98 F (36.7 C)     Temp src --      SpO2 12/20/21 1442 94 %      Weight --  Height --      Head Circumference --      Peak Flow --      Pain Score 12/20/21 1439 8     Pain Loc --      Pain Edu? --      Excl. in [place]? --    No data found.  Updated Vital Signs BP (!) 155/99   Pulse 76   Temp 98 F (36.7 C)   Resp 18   SpO2 94%   Visual Acuity Right Eye Distance:   Left Eye Distance:   Bilateral Distance:    Right Eye Near:   Left Eye Near:    Bilateral Near:     Physical Exam Vitals reviewed.  Constitutional:      General: He is awake.     Appearance: Normal appearance. He is well-developed. He is not ill-appearing.     Comments: Very pleasant male appears stated age in no acute distress sitting comfortably on exam room table  HENT:     Head: Normocephalic and atraumatic.     Mouth/Throat:     Pharynx: Uvula midline. Posterior oropharyngeal erythema present. No oropharyngeal exudate.     Comments: Mild erythema posterior oropharynx Cardiovascular:     Rate and Rhythm: Normal rate and regular rhythm.     Heart sounds: Normal heart sounds, S1 normal and S2 normal. No murmur heard. Pulmonary:     Effort: Pulmonary effort is normal.     Breath sounds: Normal breath sounds. No stridor. No wheezing, rhonchi or rales.     Comments: Clear to auscultation bilaterally Musculoskeletal:     Right foot: Normal range of motion. Swelling and tenderness present. No bony tenderness.     Comments: Right foot: Tenderness palpation with associated erythema and swelling at right first MTP.  Foot neurovascularly intact.  Normal active range of motion.  No deformity noted.  Lymphadenopathy:     Head:     Right side of head: No submental, submandibular or tonsillar adenopathy.     Left side of head: No submental, submandibular or tonsillar adenopathy.     Cervical: No cervical adenopathy.  Neurological:     Mental Status: He is alert.  Psychiatric:        Behavior: Behavior is cooperative.      UC Treatments / Results  Labs (all labs  ordered are listed, but only abnormal results are displayed) Labs Reviewed  POCT INFECTIOUS MONO SCREEN, ED / UC    EKG   Radiology No results found.  Procedures Procedures (including critical care time)  Medications Ordered in UC Medications - No data to display  Initial Impression / Assessment and Plan / UC Course  I have reviewed the triage vital signs and the nursing notes.  Pertinent labs & imaging results that were available during my care of the patient were reviewed by me and considered in my medical decision making (see chart for details).     X-ray of toe was deferred as patient denies any recent trauma and states symptoms are consistent with previous episodes of gout.  Patient was provided a refill of indomethacin.  Discussed that this can raise blood pressure and he should monitor this at home.  He is to avoid additional NSAIDs with this medication due to risk of GI bleeding.  Can use Tylenol for breakthrough pain.  Discussed that if he has any worsening symptoms or if anything changes he is to be reevaluated.  He is to follow-up with his primary  care next week.  Monotest was performed as requested by patient.  This was negative.  Discussed that it can be negative as result of early testing and if he has persistent/worsening symptoms he can return for reevaluation.  Recommended gargling with warm salt water and using Tylenol for pain relief.  He is to follow-up with PCP next week.  Final Clinical Impressions(s) / UC Diagnoses   Final diagnoses:  Gouty arthritis of right great toe  Mono exposure     Discharge Instructions      Your monotest was negative.  If you develop any worsening symptoms please return for reevaluation as we discussed.  I believe that you have gout.  Please continue indomethacin.  Do not take additional NSAIDs including aspirin, ibuprofen/Advil, naproxen/Aleve with this medication.  You can use Tylenol for pain relief.  If you have any  worsening symptoms you need to be seen immediately.  Follow-up with your primary care next week.     ED Prescriptions     Medication Sig Dispense Auth. Provider   indomethacin (INDOCIN) 50 MG capsule Take 1 capsule (50 mg total) by mouth 2 (two) times daily with a meal. 14 capsule Maritza Goldsborough K, PA-C      PDMP not reviewed this encounter.   Terrilee Croak, PA-C 12/20/21 1600

## 2021-12-20 NOTE — ED Triage Notes (Signed)
Pt reports pain in Rt great toe due to gout. Pt has an empty med. Bottle for indomethacin  50mg    . Pt reports the med works well for his gout.  Pt also reports his Daughter is positive for MONO . Pt has felt sluggish and has swollen neck glands. Pt wants to be checked for MONO.

## 2021-12-20 NOTE — Discharge Instructions (Signed)
Your monotest was negative.  If you develop any worsening symptoms please return for reevaluation as we discussed.  I believe that you have gout.  Please continue indomethacin.  Do not take additional NSAIDs including aspirin, ibuprofen/Advil, naproxen/Aleve with this medication.  You can use Tylenol for pain relief.  If you have any worsening symptoms you need to be seen immediately.  Follow-up with your primary care next week.

## 2022-03-12 ENCOUNTER — Encounter
Payer: No Typology Code available for payment source | Attending: Physical Medicine & Rehabilitation | Admitting: Physical Medicine & Rehabilitation

## 2022-03-12 DIAGNOSIS — M47817 Spondylosis without myelopathy or radiculopathy, lumbosacral region: Secondary | ICD-10-CM | POA: Insufficient documentation

## 2022-03-20 IMAGING — CR DG CHEST 2V
2 series · 2 of 2 positions shown · non-contrast
Comparison: Chest CT 10/30/2020, chest radiograph 05/15/2014

CLINICAL DATA: shortness of breath

EXAM:
CHEST - 2 VIEW

[chest pa]
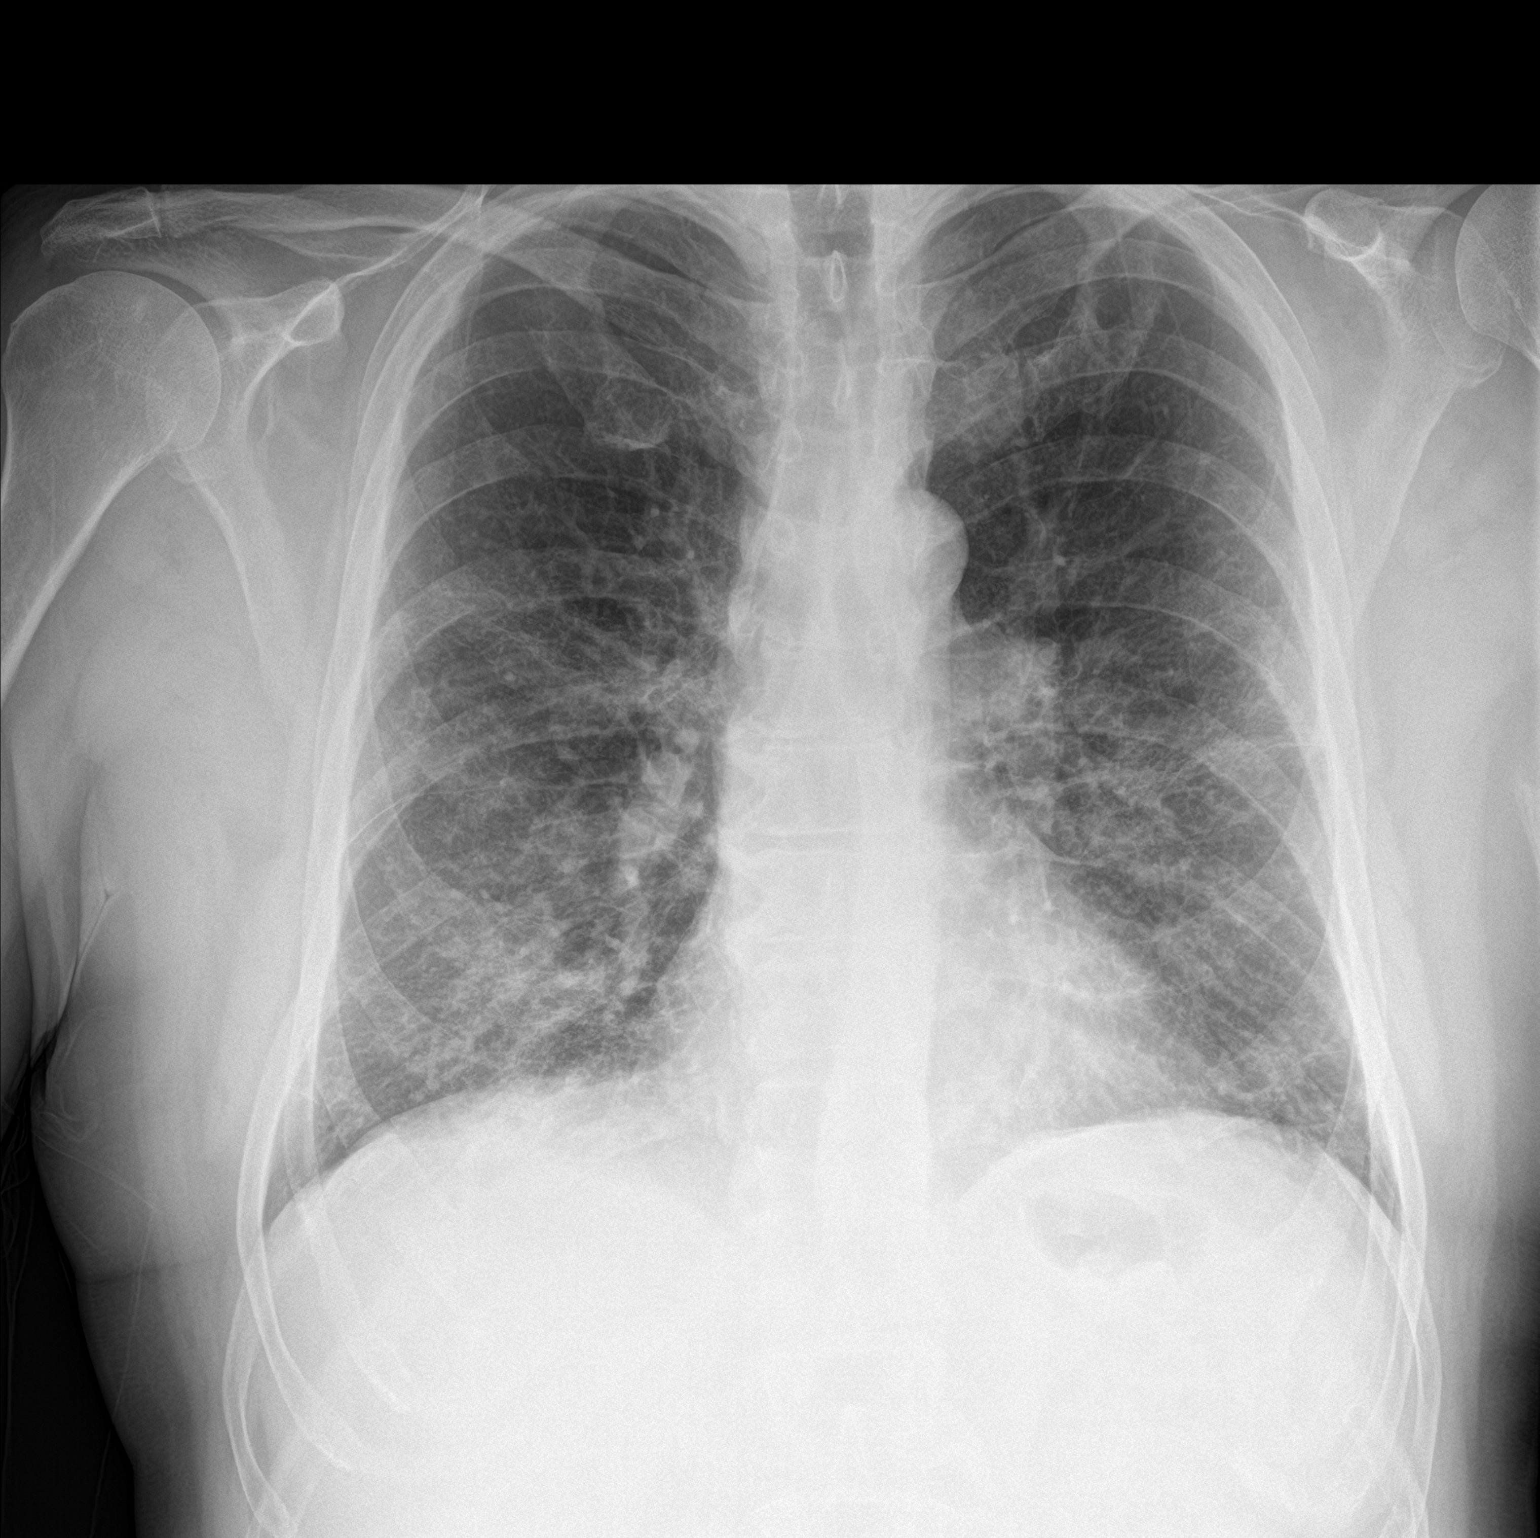

[chest lat]
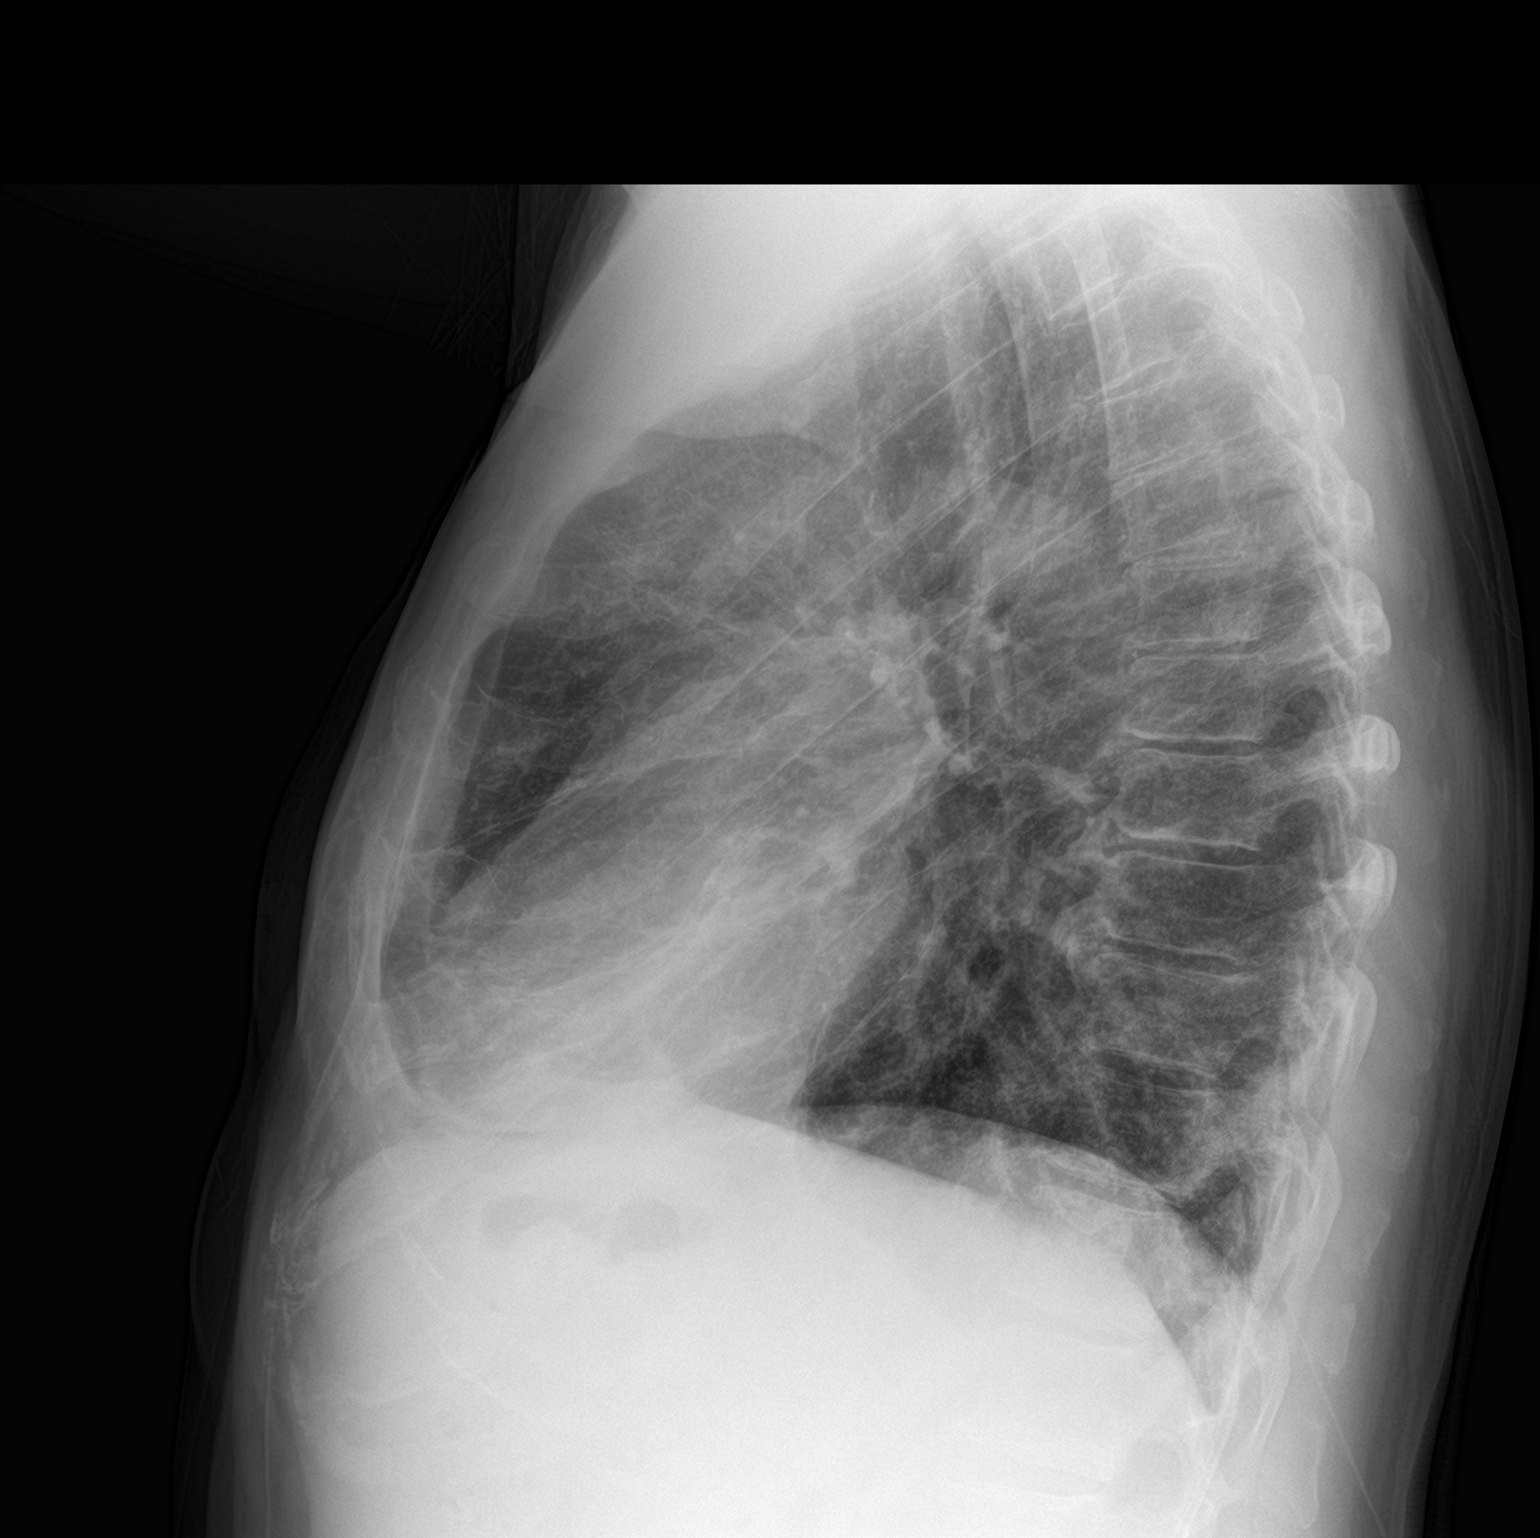

[2 of 2 positions shown; findings below may reference images not displayed]

FINDINGS: Cardiomediastinal silhouette is within normal limits. There is
patchy airspace disease in the mid to lower lungs bilaterally. There
is no large pleural effusion or visible pneumothorax. There is no
acute osseous abnormality. Thoracic spondylosis.
IMPRESSION: Patchy airspace disease in the mid to lower lungs bilaterally,
concerning for multifocal infectious process.

## 2022-03-31 ENCOUNTER — Encounter: Payer: Self-pay | Admitting: Physical Medicine & Rehabilitation

## 2022-03-31 ENCOUNTER — Encounter
Payer: No Typology Code available for payment source | Attending: Physical Medicine & Rehabilitation | Admitting: Physical Medicine & Rehabilitation

## 2022-03-31 VITALS — BP 158/95 | HR 77 | Ht 74.0 in | Wt 210.6 lb

## 2022-03-31 DIAGNOSIS — M47817 Spondylosis without myelopathy or radiculopathy, lumbosacral region: Secondary | ICD-10-CM | POA: Insufficient documentation

## 2022-03-31 MED ORDER — TRAMADOL HCL 50 MG PO TABS: 50.0000 mg | ORAL_TABLET | Freq: Two times a day (BID) | ORAL | 5 refills | Status: DC

## 2022-03-31 NOTE — Progress Notes (Signed)
Subjective:    Patient ID: Randy Greene, male    DOB: 1966-01-29, 56 y.o.   MRN: [id number]  HPI 56 year old male with history of multiple sclerosis has lumbar spondylosis without myelopathy that has responded on 2 occasions with medial branch blocks which produced a 50% pain relief on each occasion.  The patient has undergone radiofrequency procedure at the L3-L4 medial branches and L5 dorsal ramus bilaterally. Last procedure was 12/09/2021 right L3-L4-L5 RF Prior procedure 10/21/2021 left L3-L4-L5 RF The patient states that there is very little pain on the right side of the low back.  He is starting to get twinges of pain on the left side of the low back mainly with activity.  The left-sided pain is as high as 6 out of 10 with bending forward or prolonged standing.  With rest as well as stretching exercises. Pain Inventory Average Pain 6 Pain Right Now 6 My pain is burning and aching  In the last 24 hours, has pain interfered with the following? General activity 4 Relation with others 4 Enjoyment of life 4 What TIME of day is your pain at its worst? evening Sleep (in general) Good  Pain is worse with: bending and standing Pain improves with: rest and therapy/exercise Relief from Meds:  na  Family History  Problem Relation Age of Onset   Dementia Mother    Hypertension Father    Heart attack Father    Social History   Socioeconomic History   Marital status: Single    Spouse name: Not on file   Number of children: Not on file   Years of education: Not on file   Highest education level: Not on file  Occupational History   Not on file  Tobacco Use   Smoking status: Former    Packs/day: 1.00    Years: 20.00    Total pack years: 20.00    Types: Cigarettes    Quit date: 11/02/2006    Years since quitting: 15.4   Smokeless tobacco: Never  Vaping Use   Vaping Use: Never used  Substance and Sexual Activity   Alcohol use: Yes   Drug use: No    Comment: medical marijuana    Sexual activity: Never  Other Topics Concern   Not on file  Social History Narrative   Not on file   Social Determinants of Health   Financial Resource Strain: Not on file  Food Insecurity: Not on file  Transportation Needs: Not on file  Physical Activity: Not on file  Stress: Not on file  Social Connections: Not on file   Past Surgical History:  Procedure Laterality Date   ARTHROSCOPIC     BRONCHIAL WASHINGS  01/14/2021   Procedure: [place];  Surgeon: Candee Furbish, MD;  Location: [place];  Service: Pulmonary;;   HERNIA REPAIR     INCISION AND DRAINAGE Left 01/04/2017   Procedure: INCISION AND DRAINAGE LEFT KNEE;  Surgeon: Rod Can, MD;  Location: [place];  Service: Orthopedics;  Laterality: Left;   jawsurgery     KNEE ARTHROPLASTY Left 08/10/2016   Procedure: LEFT TOTAL KNEE ARTHROPLASTY WITH COMPUTER NAVIGATION;  Surgeon: Rod Can, MD;  Location: [place];  Service: Orthopedics;  Laterality: Left;   knees scope     VIDEO BRONCHOSCOPY Bilateral 01/14/2021   Procedure: VIDEO BRONCHOSCOPY WITHOUT FLUORO;  Surgeon: Candee Furbish, MD;  Location: [facility] ENDOSCOPY;  Service: Pulmonary;  Laterality: Bilateral;   Past Surgical History:  Procedure Laterality Date   ARTHROSCOPIC  BRONCHIAL WASHINGS  01/14/2021   Procedure: BRONCHIAL WASHINGS;  Surgeon: Candee Furbish, MD;  Location: [facility] ENDOSCOPY;  Service: Pulmonary;;   HERNIA REPAIR     INCISION AND DRAINAGE Left 01/04/2017   Procedure: INCISION AND DRAINAGE LEFT KNEE;  Surgeon: Rod Can, MD;  Location: [place];  Service: Orthopedics;  Laterality: Left;   jawsurgery     KNEE ARTHROPLASTY Left 08/10/2016   Procedure: LEFT TOTAL KNEE ARTHROPLASTY WITH COMPUTER NAVIGATION;  Surgeon: Rod Can, MD;  Location: [place];  Service: Orthopedics;  Laterality: Left;   knees scope     VIDEO BRONCHOSCOPY Bilateral 01/14/2021   Procedure: VIDEO BRONCHOSCOPY WITHOUT FLUORO;  Surgeon: Candee Furbish, MD;  Location: [facility]  ENDOSCOPY;  Service: Pulmonary;  Laterality: Bilateral;   Past Medical History:  Diagnosis Date   Acid reflux    Arthritis    Aspiration pneumonia (HCC)    Asthma    Delayed wound healing    left knee   Dyspnea    Enlarged prostate    Hypercapnic respiratory failure, chronic (HCC)    Hypertension    MS (multiple sclerosis) (HCC)    BP (!) 158/95   Pulse 77   Ht 6\' 2"  (1.88 m)   Wt 210 lb 9.6 oz (95.5 kg)   SpO2 94%   BMI 27.04 kg/m   Opioid Risk Score:   Fall Risk Score:  `1  Depression screen PHQ 2/9     03/31/2022    1:54 PM 12/09/2021    1:22 PM 09/04/2021    9:51 AM 06/06/2021   10:43 AM 04/10/2021    2:33 PM 02/04/2021    3:14 PM 09/03/2020    2:15 PM  Depression screen PHQ 2/9  Decreased Interest 0 0 0 0 0 0 0  Down, Depressed, Hopeless 0 0 0 0 0 0   PHQ - 2 Score 0 0 0 0 0 0 0     Review of Systems  Constitutional: Negative.   HENT: Negative.    Eyes: Negative.   Respiratory: Negative.    Cardiovascular: Negative.   Gastrointestinal: Negative.   Endocrine: Negative.   Genitourinary: Negative.   Musculoskeletal:  Positive for back pain and neck pain.  Skin: Negative.   Allergic/Immunologic: Negative.   Neurological: Negative.   Hematological: Negative.   Psychiatric/Behavioral: Negative.    All other systems reviewed and are negative.     Objective:   Physical Exam Vitals and nursing note reviewed.  Constitutional:      Appearance: He is normal weight.  HENT:     Head: Normocephalic and atraumatic.  Eyes:     Extraocular Movements: Extraocular movements intact.     Conjunctiva/sclera: Conjunctivae normal.     Pupils: Pupils are equal, round, and reactive to light.  Musculoskeletal:     Comments: Mild tenderness palpation lumbar paraspinal area There is no pain with lumbar flexion and he has full range of motion lumbar extension as well as lateral bending to the left as well as to the right causes left-sided low back pain. Negative straight  leg raising bilaterally Ambulates without assistive device no evidence of toe drag or knee instability.  Somewhat wide-based gait due to balance problems related to MS  Skin:    General: Skin is warm and dry.  Neurological:     General: No focal deficit present.     Mental Status: He is alert and oriented to person, place, and time.  Psychiatric:  Mood and Affect: Mood normal.        Behavior: Behavior normal.           Assessment & Plan:   #1.  Lumbar spondylosis without myelopathy.  Overall he has done very well with lumbar radiofrequency neurotomy his prior RF lasted around 6 months on each side.  He is starting to get some symptomatology on the left side and is now about 5 months post we will schedule for repeat procedure on the left side at L3-4-5 in approximately 4 weeks.  I would anticipate having to do the right side in December 2023

## 2022-04-06 IMAGING — CR DG CHEST 2V
2 series · 2 of 2 positions shown · non-contrast
Comparison: Chest x-ray 01/22/2021.

CLINICAL DATA: 54-year-old male with history of shortness of
breath.

EXAM:
CHEST - 2 VIEW

[chest pa]
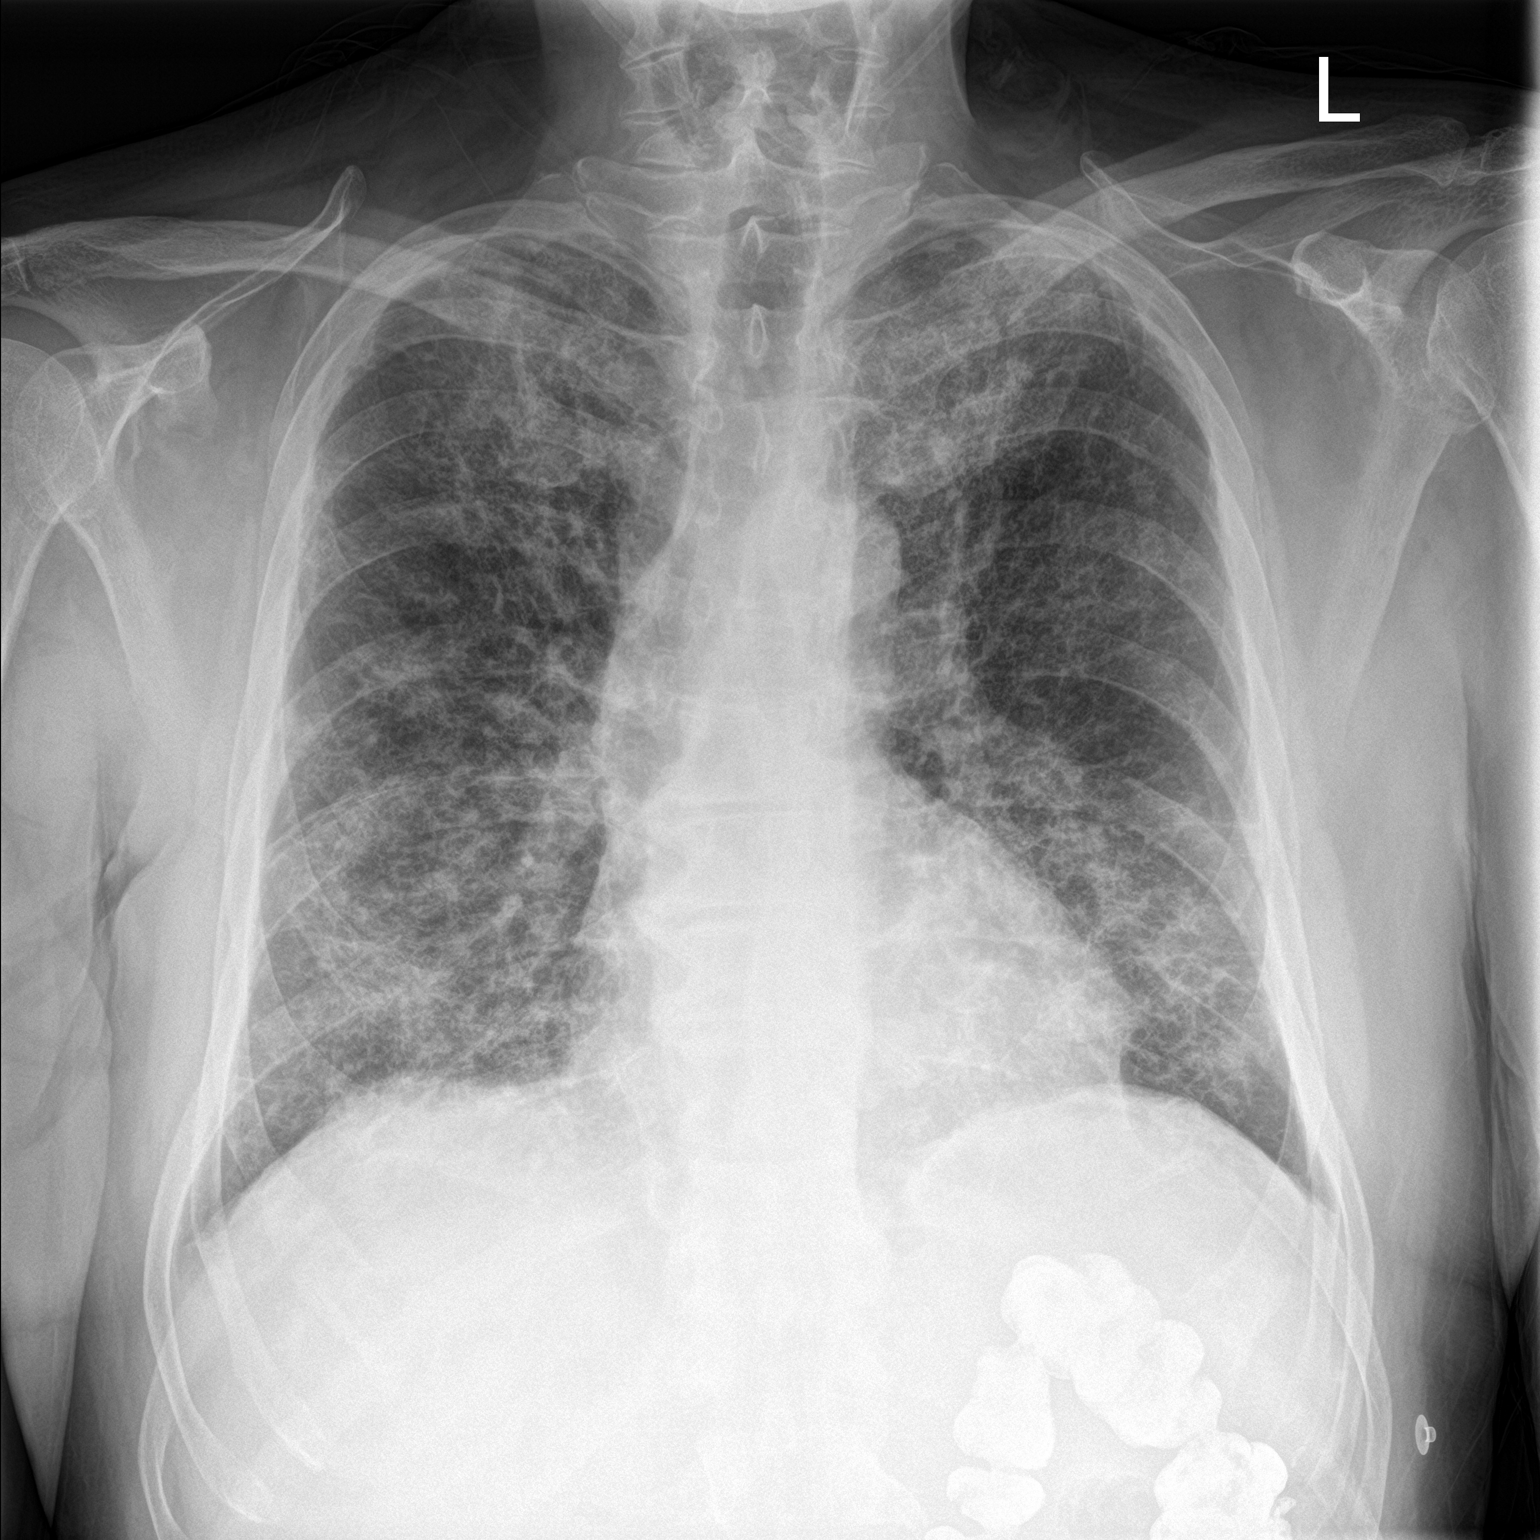

[chest lat]
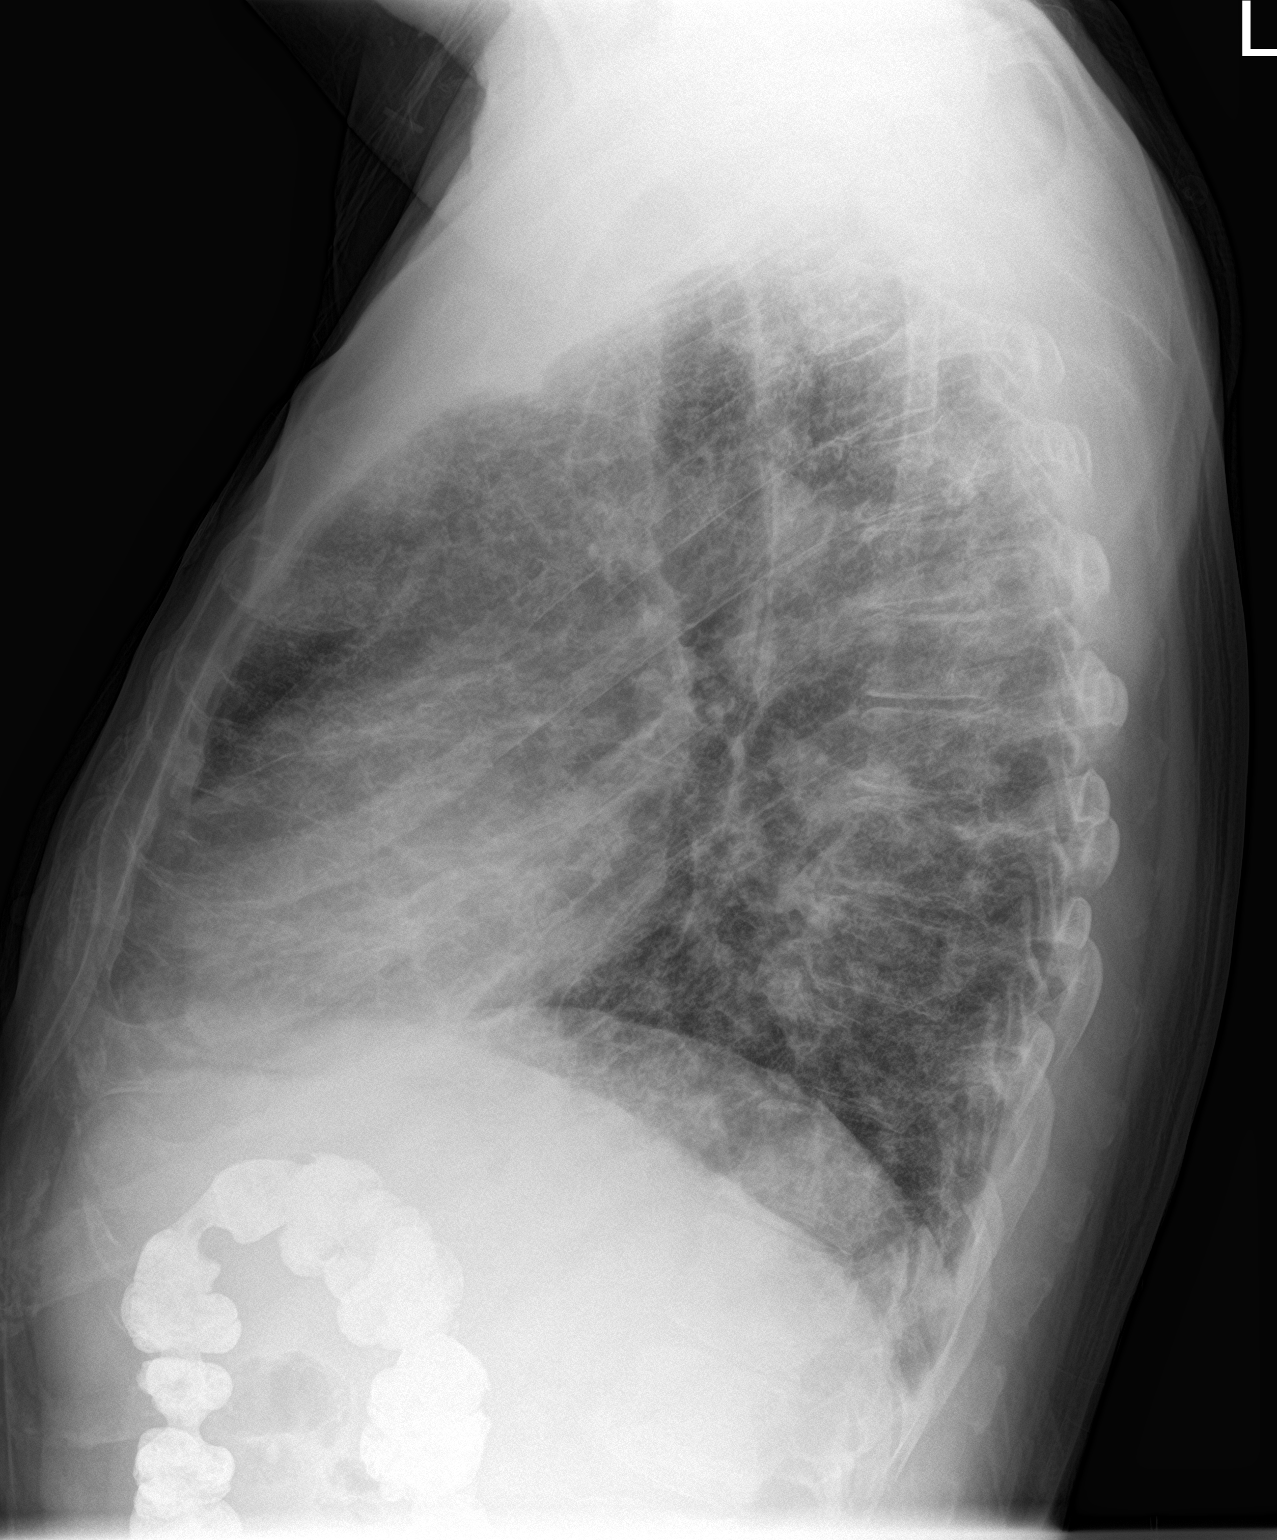

[2 of 2 positions shown; findings below may reference images not displayed]

FINDINGS: Lung volumes are normal. Patchy multifocal interstitial and airspace
disease is again noted throughout the lungs bilaterally,
asymmetrically distributed, most compatible with severe multilobar
bilateral bronchopneumonia. No pleural effusions. No pneumothorax.
No evidence of pulmonary edema. Heart size is normal. Upper
mediastinal contours are within normal limits.
IMPRESSION: 1. The appearance the chest is most compatible with severe
multilobar bilateral bronchopneumonia, similar to the prior
examination.

## 2022-04-17 ENCOUNTER — Ambulatory Visit (HOSPITAL_COMMUNITY)
Admission: EM | Admit: 2022-04-17 | Discharge: 2022-04-17 | Disposition: A | Discharge status: Home / Self Care | Payer: No Typology Code available for payment source | Attending: Internal Medicine | Admitting: Internal Medicine

## 2022-04-17 ENCOUNTER — Other Ambulatory Visit: Payer: Self-pay

## 2022-04-17 ENCOUNTER — Encounter (HOSPITAL_COMMUNITY): Payer: Self-pay

## 2022-04-17 DIAGNOSIS — D849 Immunodeficiency, unspecified: Secondary | ICD-10-CM | POA: Diagnosis not present

## 2022-04-17 DIAGNOSIS — U071 COVID-19: Secondary | ICD-10-CM | POA: Diagnosis not present

## 2022-04-17 DIAGNOSIS — R051 Acute cough: Secondary | ICD-10-CM

## 2022-04-17 MED ORDER — GUAIFENESIN ER 600 MG PO TB12: 600.0000 mg | ORAL_TABLET | Freq: Two times a day (BID) | ORAL | 0 refills | Status: DC

## 2022-04-17 MED ORDER — BENZONATATE 100 MG PO CAPS: 100.0000 mg | ORAL_CAPSULE | Freq: Three times a day (TID) | ORAL | 0 refills | Status: DC

## 2022-04-17 MED ORDER — MOLNUPIRAVIR EUA 200MG CAPSULE: 4.0000 | ORAL_CAPSULE | Freq: Two times a day (BID) | ORAL | 0 refills | Status: AC

## 2022-04-17 NOTE — ED Triage Notes (Signed)
Patient tested positive for Covid yesterday with a home test. Patient had went on [place] [facility] [place], onset symptoms 3 days ago. Started with diarrhea the headache, cough, nasal drainage, and fatigue.   Patient used his inhaler for emphysema and this helped some.   Patient is also immuno compromised, has multiple sclerosis.

## 2022-04-17 NOTE — ED Provider Notes (Signed)
[place]    CSN: [id number] Arrival date & time: 04/17/22  3299      History   Chief Complaint Chief Complaint  Patient presents with   Covid Positive   Diarrhea   Cough    HPI Randy Greene is a 56 y.o. male.   Patient presents to urgent care for evaluation of sore throat, nasal congestion, body aches, fever/chills, headache, cough, fatigue that started 3 days ago.  COVID-19 test at home last night was positive.  Patient recently took a cruise and believes that he may have caught COVID-19 from either the airport or the vacation cruise. Cough is dry with clear sputum and worse at nighttime. Nasal congestion is thin and runny/clear.  Sore throat is worsened with swallowing. Headache is generalized to the head and is currently an 8 on a scale of 0-10.  Denies vision changes and dizziness.  Reports chills but unknown highest temp at home. Also reports a few episodes of diarrhea without blood/mucus to the stools, nausea, vomiting, abdominal pain, or urinary symptoms.  Denies shortness of breath, chest pain, heart palpitations, and eye drainage.  Patient has multiple sclerosis and is immunocompromise at baseline.  Has a history of hospitalization related to COVID-19 and septic pneumonia last year for 15 days.  He is a former cigarette smoker and denies drug use.  He is vaccinated against COVID-19.  Patient has been using his Advair inhaler as prescribed and used 1 puff of his albuterol inhaler this morning which helped with his cough.  He has been using Flonase as well which has helped with his nasal congestion.  No other use of over-the-counter medications prior to arrival urgent care.  He has been drinking lots of water and pineapple juice to try to stay well-hydrated.    Diarrhea Cough   Past Medical History:  Diagnosis Date   Acid reflux    Arthritis    Aspiration pneumonia (HCC)    Asthma    Delayed wound healing    left knee   Dyspnea    Enlarged prostate     Hypercapnic respiratory failure, chronic (HCC)    Hypertension    MS (multiple sclerosis) ([place])     Patient Active Problem List   Diagnosis Date Noted   COPD (chronic obstructive pulmonary disease) ([place]) 03/06/2021   Acute on chronic respiratory failure with hypoxia ([place]) 01/23/2021   NSIP (nonspecific interstitial pneumonitis) ([place]) 01/23/2021   Abnormal CT of the chest 10/22/2020   Degenerative arthritis of left knee 08/11/2016   Osteoarthritis of left knee 08/10/2016   Agitation 03/01/2014   Chronic hypoxemic respiratory failure ([place]) 24/26/8341   Metabolic alkalosis with chronic respiratory acidosis 01/01/2014   OSA on CPAP 01/01/2014   Acid reflux 07/17/2013   Transaminitis 11/29/2012   MS (multiple sclerosis) ([place]) 11/29/2012   Recurrent aspiration bronchitis/pneumonia 11/30/2011    Past Surgical History:  Procedure Laterality Date   ARTHROSCOPIC     BRONCHIAL WASHINGS  01/14/2021   Procedure: BRONCHIAL WASHINGS;  Surgeon: Candee Furbish, MD;  Location: [facility] ENDOSCOPY;  Service: Pulmonary;;   HERNIA REPAIR     INCISION AND DRAINAGE Left 01/04/2017   Procedure: INCISION AND DRAINAGE LEFT KNEE;  Surgeon: Rod Can, MD;  Location: [place];  Service: Orthopedics;  Laterality: Left;   jawsurgery     KNEE ARTHROPLASTY Left 08/10/2016   Procedure: LEFT TOTAL KNEE ARTHROPLASTY WITH COMPUTER NAVIGATION;  Surgeon: Rod Can, MD;  Location: [place];  Service: Orthopedics;  Laterality: Left;  knees scope     VIDEO BRONCHOSCOPY Bilateral 01/14/2021   Procedure: VIDEO BRONCHOSCOPY WITHOUT FLUORO;  Surgeon: Candee Furbish, MD;  Location: [facility] ENDOSCOPY;  Service: Pulmonary;  Laterality: Bilateral;       Home Medications    Prior to Admission medications   Medication Sig Start Date End Date Taking? Authorizing Provider  Acetylcysteine 600 MG CAPS TAKE ONE CAPSULE BY MOUTH DAILY TO TREAT SYMPTOMS OF BRAIN FOG 10/06/21  Yes [provider]  albuterol (PROVENTIL  HFA;VENTOLIN HFA) 108 (90 BASE) MCG/ACT inhaler Inhale 2 puffs into the lungs every 6 (six) hours as needed for wheezing or shortness of breath.   Yes [provider]  alfuzosin (UROXATRAL) 10 MG 24 hr tablet Take 10 mg by mouth daily with breakfast.   Yes [provider]  amphetamine-dextroamphetamine (ADDERALL XR) 30 MG 24 hr capsule Take 1 capsule by mouth 2 (two) times daily. 09/30/21  Yes [provider]  aspirin EC 81 MG tablet Take 81 mg by mouth at bedtime. Swallow whole.   Yes [provider]  baclofen (LIORESAL) 10 MG tablet Take 15 mg by mouth 4 (four) times daily.    Yes [provider]  benzonatate (TESSALON) 100 MG capsule Take 1 capsule (100 mg total) by mouth every 8 (eight) hours. 04/17/22  Yes Sofhia Ulibarri, Stasia Cavalier, FNP  calcium carbonate (OS-CAL) 1250 (500 Ca) MG chewable tablet CHEW ONE TABLET BY MOUTH TWICE A DAY FOR SPASMS 02/04/21  Yes [provider]  calcium carbonate (OS-CAL) 1250 (500 Ca) MG chewable tablet CHEW ONE TABLET BY MOUTH TWICE A DAY FOR SPASMS 07/24/21  Yes [provider]  Cholecalciferol (VITAMIN D) 50 MCG (2000 UT) tablet Take 2,000 Units by mouth daily.   Yes [provider]  cyanocobalamin (,VITAMIN B-12,) 1000 MCG/ML injection INJECT 1 ML (1000MCG) INTRAMUSCULARLY EVERY 28 DAYS 02/04/21  Yes [provider]  cyclobenzaprine (FLEXERIL) 10 MG tablet TAKE ONE-HALF TABLET BY MOUTH THREE TIMES A DAY AS NEEDED FOR BACK PAIN 06/02/21  Yes [provider]  diclofenac Sodium (VOLTAREN) 1 % GEL Apply 2 g topically 2 (two) times daily as needed (pain). 11/27/19  Yes [provider]  donepezil (ARICEPT) 10 MG tablet TAKE ONE TABLET BY MOUTH DAILY "ARICEPT" TO HELP MEMORY 07/24/21  Yes [provider]  dronabinol (MARINOL) 2.5 MG capsule Take 7.5 mg by mouth 3 (three) times daily.    Yes [provider]  esomeprazole (NEXIUM) 40 MG capsule Take 40 mg by mouth 2  (two) times daily before a meal.   Yes [provider]  famotidine (PEPCID) 40 MG tablet Take 40 mg by mouth at bedtime.   Yes [provider]  finasteride (PROSCAR) 5 MG tablet Take 5 mg by mouth daily.   Yes [provider]  fluorouracil (EFUDEX) 5 % cream Apply 1 application topically 2 (two) times daily.   Yes [provider]  fluticasone-salmeterol (ADVAIR) 250-50 MCG/ACT AEPB Inhale 1 puff into the lungs in the morning and at bedtime.   Yes [provider]  Folic Acid-Vit U9-NAT F57 (FOLBEE) 2.5-25-1 MG TABS tablet Take 1 tablet by mouth daily.   Yes [provider]  guaiFENesin (MUCINEX) 600 MG 12 hr tablet Take 1 tablet (600 mg total) by mouth 2 (two) times daily. 04/17/22  Yes Kristene Liberati, Stasia Cavalier, FNP  guanFACINE (TENEX) 1 MG tablet TAKE TWO TABLETS BY MOUTH AT BEDTIME TO TREAT SYMPTOMS OF BRAIN FOG 10/06/21  Yes [provider]  indomethacin (INDOCIN) 50 MG capsule Take 1 capsule (50 mg total) by mouth 2 (two) times daily with a meal. 12/20/21  Yes Raspet, Erin K, PA-C  ketotifen (ZADITOR) 0.025 % ophthalmic solution Place 1 drop into both eyes 2 (two) times daily as needed (itching).   Yes [provider]  magnesium (MAGTAB) 84 MG (7MEQ) TBCR SR tablet TAKE ONE TABLET BY MOUTH DAILY (APPROVED. TAKE IN PLACE OF MAGNESIUM OXIDE) 07/24/21  Yes [provider]  metoCLOPramide (REGLAN) 10 MG tablet Take 1 tablet by mouth at bedtime. 07/24/21  Yes [provider]  molnupiravir EUA (LAGEVRIO) 200 mg CAPS capsule Take 4 capsules (800 mg total) by mouth 2 (two) times daily for 5 days. 04/17/22 04/22/22 Yes Jaylanie Boschee, Stasia Cavalier, FNP  montelukast (SINGULAIR) 10 MG tablet Take 10 mg by mouth daily.   Yes [provider]  naloxone (NARCAN) nasal spray 4 mg/0.1 mL Place 1 spray into the nose See admin instructions. SPRAY 1 SPRAY INTO ONE NOSTRIL AS DIRECTED FOR OPIOID OVERDOSE (TURN PERSON ON SIDE AFTER DOSE. IF  NO RESPONSE IN 2-3 MINUTES OR PERSON RESPONDS BUT RELAPSES, REPEAT USING A NEW SPRAY DEVICE AND SPRAY INTO THE OTHER NOSTRIL. CALL 911 AFTER USE.) * EMERGENCY USE ONLY * 01/16/20  Yes [provider]  Omega-3 Fatty Acids (FISH OIL PO) Take 1 capsule by mouth daily.   Yes [provider]  phenytoin (DILANTIN) 100 MG ER capsule Take 100 mg by mouth 2 (two) times daily. 07/18/20  Yes [provider]  pravastatin (PRAVACHOL) 10 MG tablet TAKE ONE TABLET BY MOUTH AT BEDTIME FOR CHOLESTEROL 03/03/21  Yes [provider]  pregabalin (LYRICA) 150 MG capsule Take 150 mg by mouth See admin instructions. 150mg  am, 150mg  noon, and 300mg  qhs.   Yes [provider]  Probiotic Product (ALIGN) 4 MG CAPS Take 4 mg by mouth in the morning and at bedtime.   Yes [provider]  traMADol (ULTRAM) 50 MG tablet Take 1 tablet (50 mg total) by mouth 2 (two) times daily. 03/31/22  Yes Kirsteins, Luanna Salk, MD  vitamin C (ASCORBIC ACID) 500 MG tablet Take 500 mg by mouth daily.   Yes [provider]  vitamin E 180 MG (400 UNITS) capsule Take 400 Units by mouth daily.   Yes [provider]  Zinc 50 MG TABS Take 50 mg by mouth every other day.   Yes [provider]    Family History Family History  Problem Relation Age of Onset   Dementia Mother    Hypertension Father    Heart attack Father     Social History Social History   Tobacco Use   Smoking status: Former    Packs/day: 1.00    Years: 20.00    Total pack years: 20.00    Types: Cigarettes    Quit date: 11/02/2006    Years since quitting: 15.4   Smokeless tobacco: Never  Vaping Use   Vaping Use: Never used  Substance Use Topics   Alcohol use: Yes   Drug use: No    Comment: medical marijuana     Allergies   Penicillins, Tape, Zoloft [sertraline], Food, Morphine, Morphine and related, Mushroom extract complex, and Penicillin g   Review of Systems Review of Systems   Respiratory:  Positive for cough.   Gastrointestinal:  Positive for diarrhea.  Per HPI   Physical Exam Triage Vital Signs ED Triage Vitals  Enc Vitals Group     BP 04/17/22 1026 (!)  143/98     Pulse Rate 04/17/22 1026 (!) 112     Resp 04/17/22 1026 16     Temp 04/17/22 1026 99.1 F (37.3 C)     Temp Source 04/17/22 1026 Oral     SpO2 04/17/22 1026 96 %     Weight --      Height --      Head Circumference --      Peak Flow --      Pain Score 04/17/22 1024 8     Pain Loc --      Pain Edu? --      Excl. in [place]? --    No data found.  Updated Vital Signs BP (!) 143/98 (BP Location: Right Arm)   Pulse (!) 112   Temp 99.1 F (37.3 C) (Oral)   Resp 16   SpO2 96%   Visual Acuity Right Eye Distance:   Left Eye Distance:   Bilateral Distance:    Right Eye Near:   Left Eye Near:    Bilateral Near:     Physical Exam Vitals and nursing note reviewed.  Constitutional:      Appearance: Normal appearance. He is not ill-appearing or toxic-appearing.  HENT:     Head: Normocephalic and atraumatic.     Right Ear: Hearing, tympanic membrane, ear canal and external ear normal.     Left Ear: Hearing, tympanic membrane, ear canal and external ear normal.     Nose: Congestion and rhinorrhea present.     Mouth/Throat:     Lips: Pink.     Mouth: Mucous membranes are moist.     Pharynx: No posterior oropharyngeal erythema.     Comments: Copious white/clear nasal drainage to the posterior oropharynx. Eyes:     General: Lids are normal. Vision grossly intact. Gaze aligned appropriately.        Right eye: No discharge.        Left eye: No discharge.     Extraocular Movements: Extraocular movements intact.     Conjunctiva/sclera: Conjunctivae normal.     Pupils: Pupils are equal, round, and reactive to light.  Cardiovascular:     Rate and Rhythm: Normal rate and regular rhythm.     Heart sounds: Normal heart sounds, S1 normal and S2 normal.  Pulmonary:     Effort: Pulmonary  effort is normal. No accessory muscle usage, prolonged expiration or respiratory distress.     Breath sounds: Normal breath sounds and air entry. No stridor or decreased air movement. No decreased breath sounds, wheezing, rhonchi or rales.     Comments: No adventitious lung sounds heard to auscultation.  No acute respiratory distress. Chest:     Chest wall: No tenderness.  Abdominal:     General: Abdomen is flat. Bowel sounds are normal.     Palpations: Abdomen is soft.  Musculoskeletal:     Cervical back: Normal range of motion and neck supple.  Lymphadenopathy:     Cervical: No cervical adenopathy.  Skin:    General: Skin is warm and dry.     Capillary Refill: Capillary refill takes less than 2 seconds.     Findings: No rash.  Neurological:     General: No focal deficit present.     Mental Status: He is alert and oriented to person, place, and time. Mental status is at baseline.     Cranial Nerves: No dysarthria or facial asymmetry.     Motor: No weakness.     Comments: Neurologically  intact to baseline.  Psychiatric:        Mood and Affect: Mood normal.        Speech: Speech normal.        Behavior: Behavior normal.        Thought Content: Thought content normal.        Judgment: Judgment normal.      UC Treatments / Results  Labs (all labs ordered are listed, but only abnormal results are displayed) Labs Reviewed - No data to display  EKG   Radiology No results found.  Procedures Procedures (including critical care time)  Medications Ordered in UC Medications - No data to display  Initial Impression / Assessment and Plan / UC Course  I have reviewed the triage vital signs and the nursing notes.  Pertinent labs & imaging results that were available during my care of the patient were reviewed by me and considered in my medical decision making (see chart for details).   1.  COVID-19 COVID-19 testing at home is positive.  Rest, fluids, and prescriptions for  symptomatic relief. No indication for imaging today based on stable cardiopulmonary exam and hemodynamically stable vital signs.  Quarantine guidelines discussed. Currently on day 3 of symptoms.   Patient is at increased risk for severe illness and has been hospitalized related to COVID-19 in the past.  Therefore, we will provide molnupiravir prescription to be taken for the next 5 days.  Guaifenesin, Tessalon Perles, and molnupiravir sent to pharmacy for symptomatic relief to be taken as prescribed.   May use ibuprofen/tylenol over the counter for body aches, fever/chills, and overall discomfort associated with viral illness. Nonpharmacologic interventions for symptom relief provided and after visit summary below.   Strict ED/urgent care return precautions given.  Patient verbalizes understanding and agreement with plan.  Counseled patient regarding possible side effects and uses of all medications prescribed at today's visit.  Patient verbalizes understanding and agreement with plan.  All questions answered.  Patient discharged from urgent care in stable condition.        Final Clinical Impressions(s) / UC Diagnoses   Final diagnoses:  COVID-19  Immunocompromised ([place])  Acute cough     Discharge Instructions      You have COVID-19 illness. Stay at home until day 6 of symptoms. On day 6, you may go out into public and go back to work, but you must wear a mask until day 11 of symptoms to prevent spread to others.  Take guaifenesin 600mg   every 12 hours to thin your mucous so that you can get it out of your body easier with coughing/blowing your nose. Drink plenty of water while taking this medication so that it works well in your body (at least 8 cups a day).   Take tessalon pearles every 8 hours as needed for cough.  You may take Tylenol or ibuprofen every 6 hours as needed for fever, chills, or aches and pains.  Take molnupiravir twice daily for the next 5 days to prevent severe  illness.  Rehydrate with liquid IV, Pedialyte, or Powerade. Eat the brat diet over the next 12 to 24 hours to allow your stomach to heal.  If you develop any new or worsening symptoms, please return.  If your symptoms are severe, please go to the emergency room.  Follow-up with your primary care provider for further evaluation and management of your symptoms as well as ongoing wellness visits.  I hope you feel better!      ED  Prescriptions     Medication Sig Dispense Auth. Provider   molnupiravir EUA (LAGEVRIO) 200 mg CAPS capsule Take 4 capsules (800 mg total) by mouth 2 (two) times daily for 5 days. 40 capsule Talbot Grumbling, FNP   guaiFENesin (MUCINEX) 600 MG 12 hr tablet Take 1 tablet (600 mg total) by mouth 2 (two) times daily. 30 tablet Joella Prince M, FNP   benzonatate (TESSALON) 100 MG capsule Take 1 capsule (100 mg total) by mouth every 8 (eight) hours. 21 capsule Talbot Grumbling, FNP      PDMP not reviewed this encounter.   Talbot Grumbling, [place] 04/17/22 1139

## 2022-04-17 NOTE — Discharge Instructions (Addendum)
You have COVID-19 illness. Stay at home until day 6 of symptoms. On day 6, you may go out into public and go back to work, but you must wear a mask until day 11 of symptoms to prevent spread to others.  Take guaifenesin 600mg   every 12 hours to thin your mucous so that you can get it out of your body easier with coughing/blowing your nose. Drink plenty of water while taking this medication so that it works well in your body (at least 8 cups a day).   Take tessalon pearles every 8 hours as needed for cough.  You may take Tylenol or ibuprofen every 6 hours as needed for fever, chills, or aches and pains.  Take molnupiravir twice daily for the next 5 days to prevent severe illness.  Rehydrate with liquid IV, Pedialyte, or Powerade. Eat the brat diet over the next 12 to 24 hours to allow your stomach to heal.  If you develop any new or worsening symptoms, please return.  If your symptoms are severe, please go to the emergency room.  Follow-up with your primary care provider for further evaluation and management of your symptoms as well as ongoing wellness visits.  I hope you feel better!

## 2022-04-28 ENCOUNTER — Telehealth: Payer: Self-pay

## 2022-04-28 ENCOUNTER — Encounter: Payer: No Typology Code available for payment source | Admitting: Physical Medicine & Rehabilitation

## 2022-04-28 DIAGNOSIS — M47817 Spondylosis without myelopathy or radiculopathy, lumbosacral region: Secondary | ICD-10-CM

## 2022-04-28 NOTE — Telephone Encounter (Signed)
Called pt and left VM to advise, we have not heard back from [place] in regards to his procedure for today, Continuation of care form was faxed over on Friday 04/24/22 Nurse navigator is out of office and had a backup reached out to her 3x and left VM's [phone]

## 2022-05-13 IMAGING — CT CT CHEST SUPER D W/O CM
2 of 4 series · 15 of 36 positions shown, 18 images · non-contrast
Comparison: CT chest angiogram, 01/12/2021, CT chest, 10/30/2020

CLINICAL DATA: Follow-up lung nodule, history of COVID

EXAM:
CT CHEST WITHOUT CONTRAST
TECHNIQUE: Multidetector CT imaging of the chest was performed using thin slice
collimation for electromagnetic bronchoscopy planning purposes,
without intravenous contrast.

[Series 5: coronal · coronal · 0.64mm/px · 3 of 150 slices shown]
[im 30/150  lung]
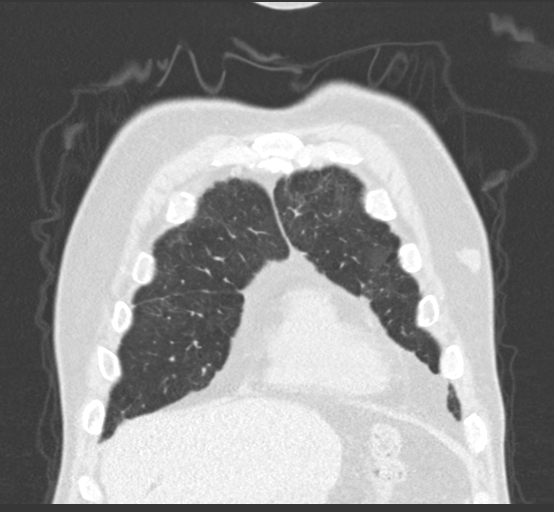
[im 60/150  lung]
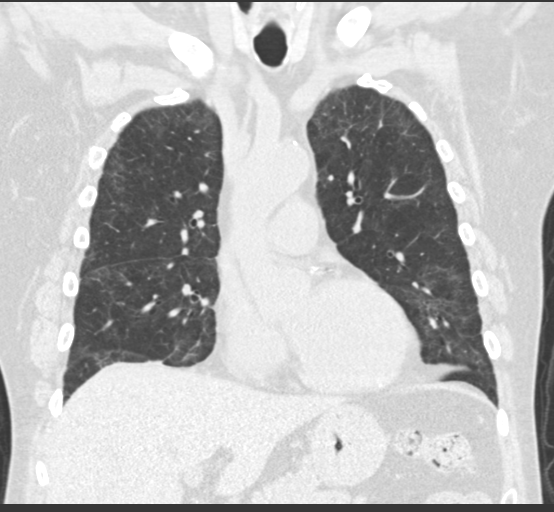
[im 90/150  lung]
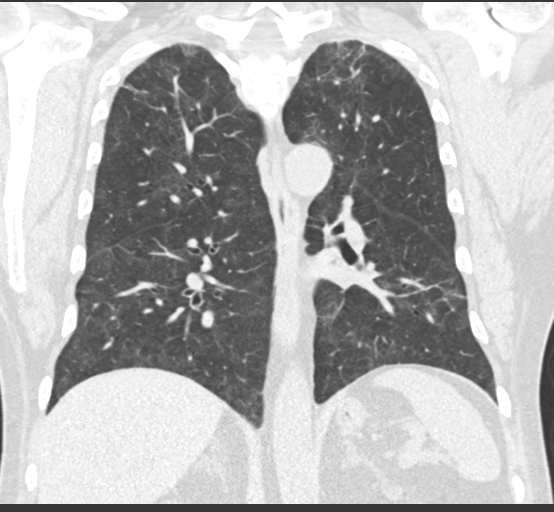

[Series 7: lungs · axial · 0.73mm/px · z∈[+1233,+1519]mm · 12 of 161 slices shown, 15 images]
[im 9/161  mediastinal]
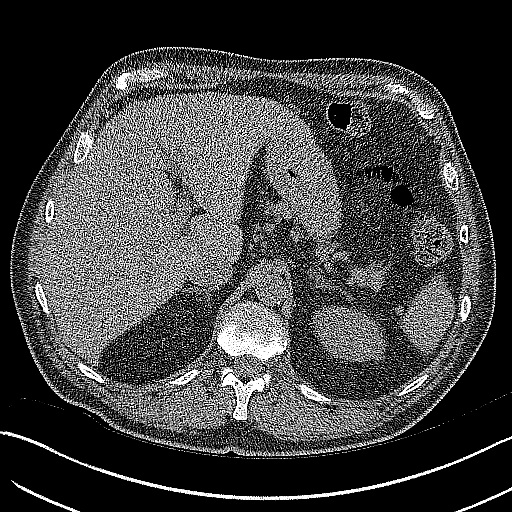
[im 9/161  lung]
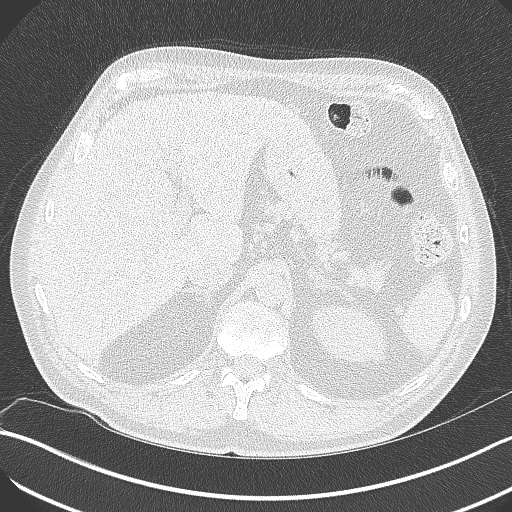
[im 26/161  lung]
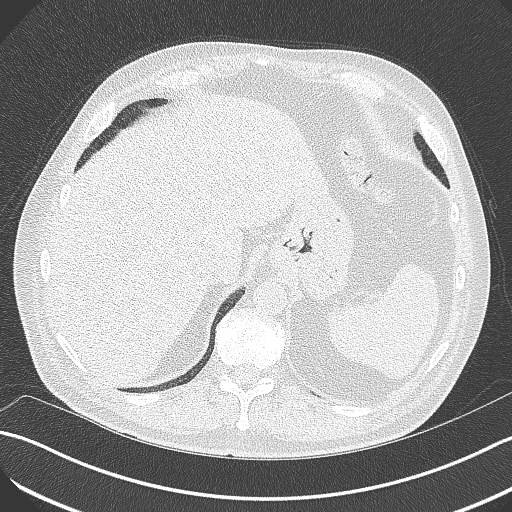
[im 34/161  lung]
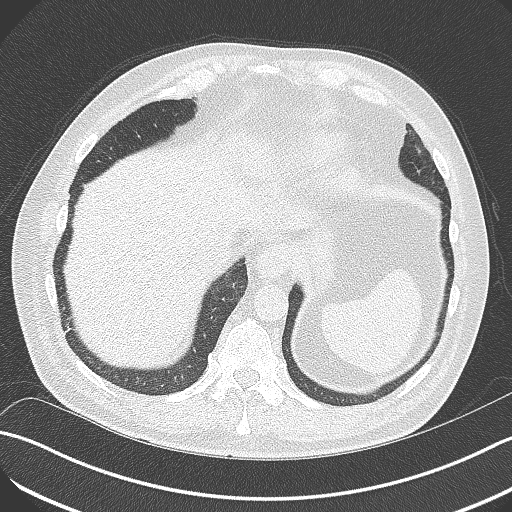
[im 51/161  lung]
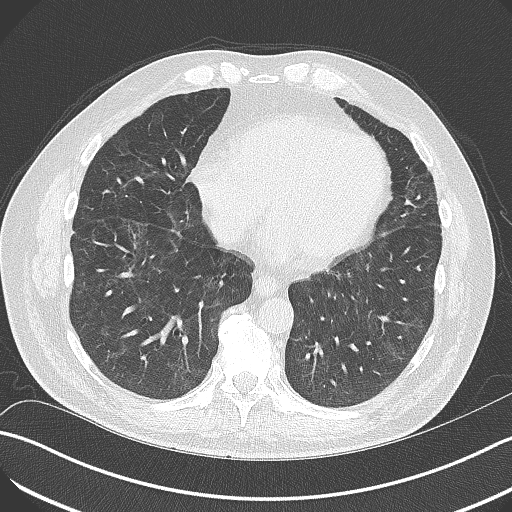
[im 59/161  mediastinal]
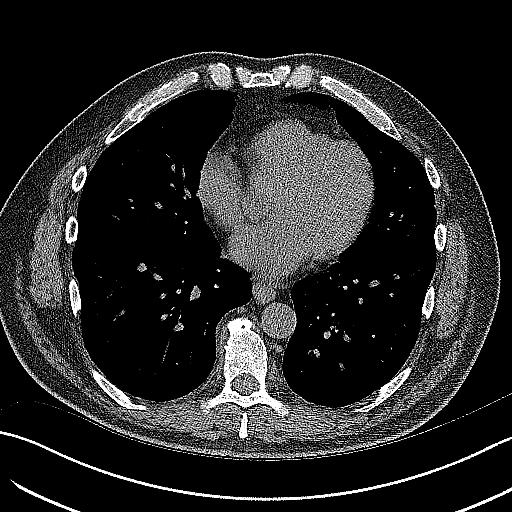
[im 59/161  lung]
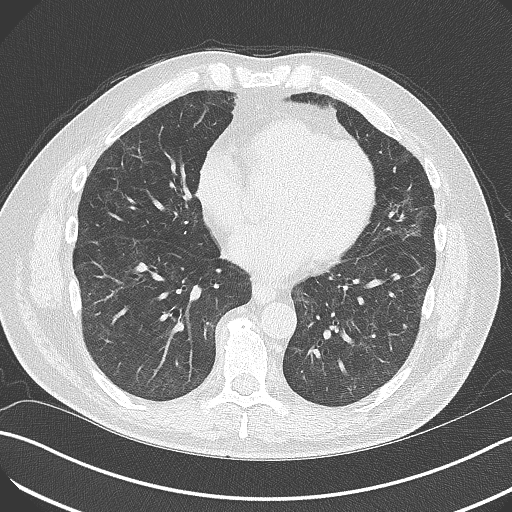
[im 76/161  lung]
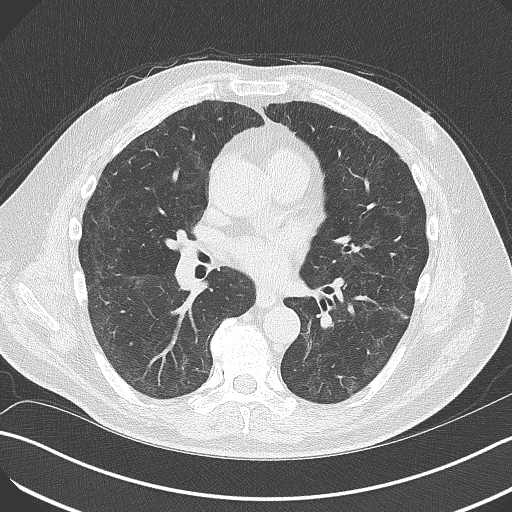
[im 85/161  lung]
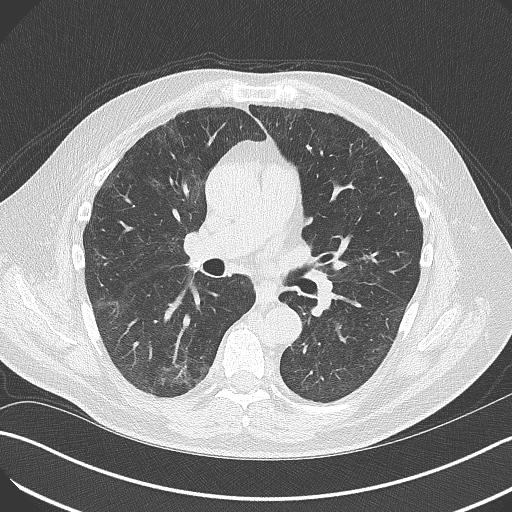
[im 102/161  lung]
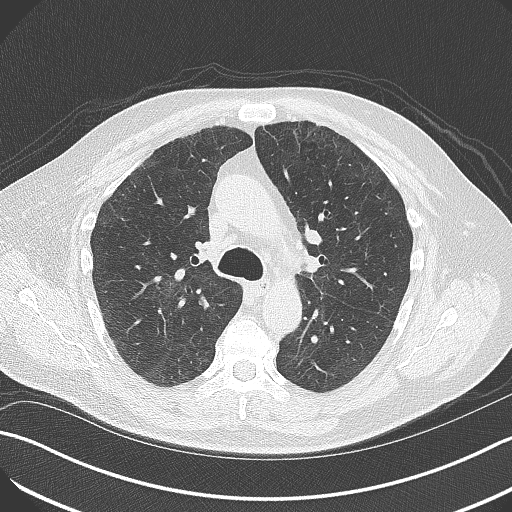
[im 110/161  mediastinal]
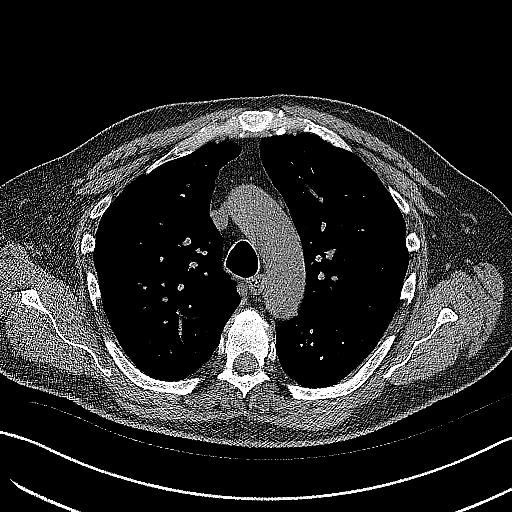
[im 110/161  lung]
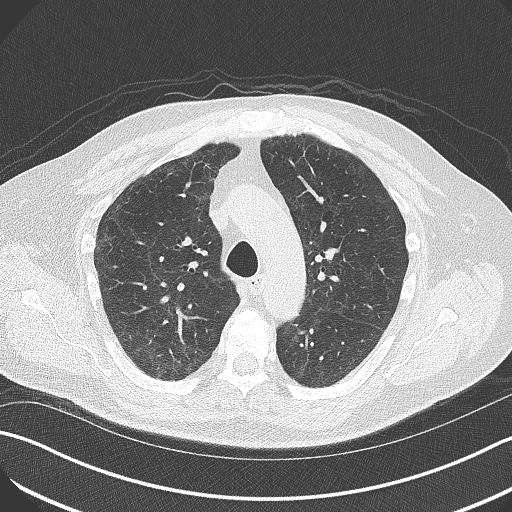
[im 127/161  lung]
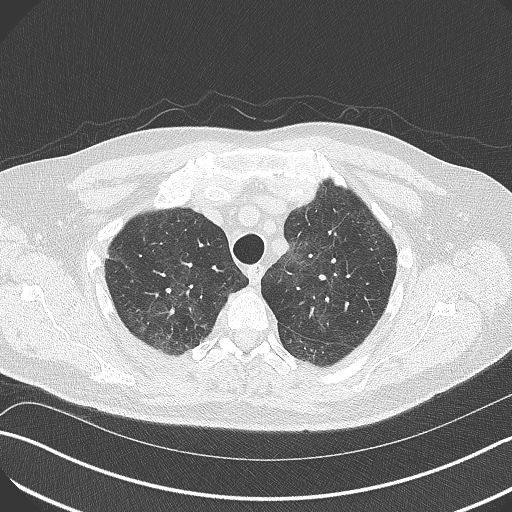
[im 135/161  lung]
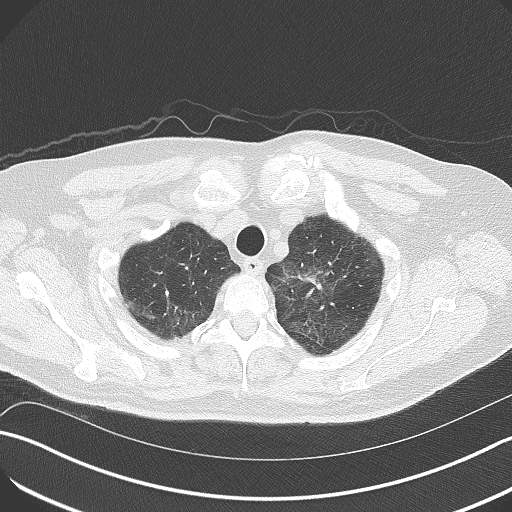
[im 152/161  lung]
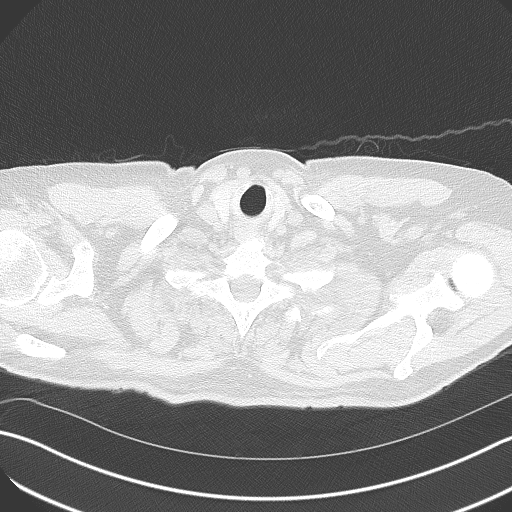

[15 of 36 positions shown; findings below may reference images not displayed]

FINDINGS: Cardiovascular: Scattered aortic atherosclerosis. Normal heart size.
Left coronary artery calcifications no pericardial effusion.

Mediastinum/Nodes: No enlarged mediastinal, hilar, or axillary lymph
nodes. Calcified right hilar lymph nodes, in keeping with prior
granulomatous infection. Thyroid gland, trachea, and esophagus
demonstrate no significant findings.

Lungs/Pleura: Very extensive acute airspace disease seen on prior
examination is resolved, with residual bandlike scarring and
architectural distortion throughout the lungs (series 7, image 28,
94). Irregular, nodular airspace opacity seen in the posterior left
upper lobe on prior examination dated 10/30/2020 is likewise
resolved. Stable, benign 3 mm pulmonary nodule of the left lower
lobe (series 7, image 103) and right middle lobe (series 7, image
98). Definitively benign calcified nodule of the superior segment
right lower lobe (series 2, image 33). No pleural effusion or
pneumothorax.

Upper Abdomen: No acute abnormality.

Musculoskeletal: No chest wall mass or suspicious bone lesions
identified.
IMPRESSION: 1. Very extensive acute airspace disease seen on immediate prior
examination is resolved, with mild residual bandlike scarring and
architectural distortion throughout the lungs. Findings are in
keeping with chronic post infectious/inflammatory sequelae of COVID
airspace disease.
2. Irregular, nodular airspace opacity seen in the posterior left
upper lobe on prior examination dated 10/30/2020 is likewise
resolved, consistent with resolution of nonspecific infection or
inflammation.
3. Stable, benign 3 mm pulmonary nodules of the left lower lobe and
right middle lobe. No further follow-up or characterization is
required.
4. Coronary artery disease.

Aortic Atherosclerosis ([id number]).

## 2022-05-15 ENCOUNTER — Other Ambulatory Visit (HOSPITAL_COMMUNITY): Payer: Self-pay | Admitting: Nurse Practitioner

## 2022-05-15 DIAGNOSIS — R9721 Rising PSA following treatment for malignant neoplasm of prostate: Secondary | ICD-10-CM

## 2022-05-21 ENCOUNTER — Encounter: Payer: Self-pay | Admitting: Physical Medicine & Rehabilitation

## 2022-05-21 ENCOUNTER — Encounter
Payer: No Typology Code available for payment source | Attending: Physical Medicine & Rehabilitation | Admitting: Physical Medicine & Rehabilitation

## 2022-05-21 VITALS — Ht 74.0 in | Wt 218.0 lb

## 2022-05-21 DIAGNOSIS — M47817 Spondylosis without myelopathy or radiculopathy, lumbosacral region: Secondary | ICD-10-CM | POA: Diagnosis not present

## 2022-05-21 MED ORDER — LIDOCAINE HCL 1 % IJ SOLN
10.0000 mL | Freq: Once | INTRAMUSCULAR | Status: AC
  Administered 2022-05-21: 10 mL

## 2022-05-21 MED ORDER — LIDOCAINE HCL (PF) 2 % IJ SOLN
5.0000 mL | Freq: Once | INTRAMUSCULAR | Status: AC
  Administered 2022-05-21: 5 mL

## 2022-05-21 NOTE — Patient Instructions (Signed)
You had a radio frequency procedure today This was done to alleviate joint pain in your lumbar area We injected lidocaine which is a local anesthetic.  You may experience soreness at the injection sites. You may also experienced some irritation of the nerves that were heated I'm recommending ice for 30 minutes every 2 hours as needed for the next 24-48 hours   

## 2022-05-21 NOTE — Progress Notes (Signed)
  PROCEDURE RECORD [facility]   Name: Randy Greene DOB:12-06-1965 MRN: [id number]  Date:05/21/2022  Physician: Alysia Penna, MD    Nurse/CMA: Jorja Loa MA  Allergies:  Allergies  Allergen Reactions   Penicillins Hives and Other (See Comments)    [ +FAMILY HISTORY] Has patient had a PCN reaction causing immediate rash, facial/tongue/throat swelling, SOB or lightheadedness with hypotension: Unknown Has patient had a PCN reaction causing severe rash involving mucus membranes or skin necrosis: Unknown Has patient had a PCN reaction that required hospitalization: No Has patient had a PCN reaction occurring within the last 10 years: No If all of the above answers are "NO", then may proceed with Cephalosporin use.     Tape Rash   Zoloft [Sertraline] Rash   Food Nausea And Vomiting and Other (See Comments)    GREEN PEPPERS- flushed  PT DOES NOT EAT MEAT   Morphine Itching   Morphine And Related Itching   Mushroom Extract Complex Nausea And Vomiting and Other (See Comments)    And flushed Other reaction(s): Other (See Comments) And flushed   Penicillin G Rash    Consent Signed: Yes.    Is patient diabetic? No.  CBG today? .  Pregnant: No. LMP: No LMP for male patient. (age 60-55)  Anticoagulants: no Anti-inflammatory: no Antibiotics: no  Procedure: Left L3,4,5 Radiofrequency  Position: Prone Start Time: 10:38 am  End Time: 10:47 am  Fluoro Time: 80  RN/CMA Lee, CMA Shanti Eichel, MA    Time 10:17 am 10:59 am    BP 120/85 125/85    Pulse 104 86    Respirations 16 16    O2 Sat 98 96    S/S 6 6    Pain Level 8/10 4     D/C home with no one, patient A & O X 3, D/C instructions reviewed, and sits independently.

## 2022-05-21 NOTE — Addendum Note (Signed)
Addended by: Franchot Gallo on: 05/21/2022 03:52 PM   Modules accepted: Orders

## 2022-05-21 NOTE — Progress Notes (Signed)
Left L5 dorsal ramus., left L4 and left L3 medial branch radio frequency neurotomy under fluoroscopic guidance  Indication: Low back pain due to lumbar spondylosis which has been relieved on 2 occasions by greater than 50% by lumbar medial branch blocks at corresponding levels.  Informed consent was obtained after describing risks and benefits of the procedure with the patient, this includes bleeding, bruising, infection, paralysis and medication side effects. The patient wishes to proceed and has given written consent. The patient was placed in a prone position. The lumbar and sacral area was marked and prepped with Betadine. A 25-gauge 1-1/2 inch needle was inserted into the skin and subcutaneous tissue at 3 sites in one ML of 2% lidocaine was injected into each site. Then a 18-gauge 10 cm radio frequency needle with a 1 cm curved active tip was inserted targeting the left S1 SAP/sacral ala junction. Bone contact was made and confirmed with lateral imaging.  motor stimulation at 2 Hz confirm proper needle location followed by injection of one ML of 2% MPF lidocaine. Then the left L5 SAP/transverse process junction was targeted. Bone contact was made and confirmed with lateral imaging motor stimulation at 2 Hz confirm proper needle location followed by injection of one ML of the solution containing one ML of  2% MPF lidocaine. Then the left L4 SAP/transverse process junction was targeted. Bone contact was made and confirmed with lateral imaging. motor stimulation at 2 Hz confirm proper needle location followed by injection of one ML of the solution containing one ML of2% MPF lidocaine. Radio frequency lesion  at [facility] for 90 seconds was performed. Needles were removed. Post procedure instructions and vital signs were performed. Patient tolerated procedure well. Followup appointment was given.

## 2022-05-24 ENCOUNTER — Ambulatory Visit (HOSPITAL_COMMUNITY)
Admission: RE | Admit: 2022-05-24 | Discharge: 2022-05-24 | Disposition: A | Discharge status: Home / Self Care | Payer: No Typology Code available for payment source | Source: Ambulatory Visit | Attending: Nurse Practitioner | Admitting: Nurse Practitioner

## 2022-05-24 DIAGNOSIS — R9721 Rising PSA following treatment for malignant neoplasm of prostate: Secondary | ICD-10-CM | POA: Insufficient documentation

## 2022-05-24 MED ORDER — GADOBUTROL 1 MMOL/ML IV SOLN
9.5000 mL | Freq: Once | INTRAVENOUS | Status: AC | PRN
  Administered 2022-05-24: 9.5 mL via INTRAVENOUS

## 2022-07-02 ENCOUNTER — Ambulatory Visit: Payer: Medicare (Managed Care) | Admitting: Physical Medicine & Rehabilitation

## 2022-07-14 ENCOUNTER — Encounter: Payer: Self-pay | Admitting: Physical Medicine & Rehabilitation

## 2022-07-14 ENCOUNTER — Encounter
Payer: No Typology Code available for payment source | Attending: Physical Medicine & Rehabilitation | Admitting: Physical Medicine & Rehabilitation

## 2022-07-14 VITALS — BP 138/89 | HR 84 | Ht 74.0 in

## 2022-07-14 DIAGNOSIS — M47817 Spondylosis without myelopathy or radiculopathy, lumbosacral region: Secondary | ICD-10-CM | POA: Diagnosis not present

## 2022-07-14 MED ORDER — LIDOCAINE HCL (PF) 2 % IJ SOLN
5.0000 mL | Freq: Once | INTRAMUSCULAR | Status: AC
  Administered 2022-07-14: 5 mL

## 2022-07-14 MED ORDER — TRAMADOL HCL 50 MG PO TABS: 50.0000 mg | ORAL_TABLET | Freq: Two times a day (BID) | ORAL | 5 refills | Status: DC

## 2022-07-14 MED ORDER — LIDOCAINE HCL 1 % IJ SOLN
10.0000 mL | Freq: Once | INTRAMUSCULAR | Status: AC
  Administered 2022-07-14: 10 mL

## 2022-07-14 NOTE — Progress Notes (Signed)
  PROCEDURE RECORD [facility]   Name: Randy Greene DOB:May 31, 1966 MRN: [id number]  Date:07/14/2022  Physician: Claudette Laws, MD    Nurse/CMA: Illyanna Petillo RMA   Allergies:  Allergies  Allergen Reactions   Penicillins Hives and Other (See Comments)    [ +FAMILY HISTORY] Has patient had a PCN reaction causing immediate rash, facial/tongue/throat swelling, SOB or lightheadedness with hypotension: Unknown Has patient had a PCN reaction causing severe rash involving mucus membranes or skin necrosis: Unknown Has patient had a PCN reaction that required hospitalization: No Has patient had a PCN reaction occurring within the last 10 years: No If all of the above answers are "NO", then may proceed with Cephalosporin use.     Tape Rash   Zoloft [Sertraline] Rash   Food Nausea And Vomiting and Other (See Comments)    GREEN PEPPERS- flushed  PT DOES NOT EAT MEAT   Morphine Itching   Morphine And Related Itching   Mushroom Extract Complex Nausea And Vomiting and Other (See Comments)    And flushed Other reaction(s): Other (See Comments) And flushed   Penicillin G Rash    Consent Signed: Yes.    Is patient diabetic? No.  CBG today? .  Pregnant: No. LMP: No LMP for male patient. (age 6-55)  Anticoagulants: no Anti-inflammatory: no Antibiotics: no  Procedure: Right L#-4-5 radiofrequency  Position: Prone Start Time: 1:07PM  End Time: 1:20pm  Fluoro Time: 28  RN/CMA Mattix Imhof RMA  Chinonso Linker RMA    Time 12:56pm 1:22pm    BP 138 89 145/89    Pulse 85 81    Respirations 16 16    O2 Sat 97 96    S/S 6 6    Pain Level 6/10 6/10     D/C home with Self, patient A & O X 3, D/C instructions reviewed, and sits independently.

## 2022-07-14 NOTE — Patient Instructions (Signed)
You had a radio frequency procedure today This was done to alleviate joint pain in your lumbar area We injected lidocaine which is a local anesthetic.  You may experience soreness at the injection sites. You may also experienced some irritation of the nerves that were heated I'm recommending ice for 30 minutes every 2 hours as needed for the next 24-48 hours   

## 2022-07-14 NOTE — Progress Notes (Signed)

## 2022-09-09 NOTE — Progress Notes (Signed)
Sent message, via epic in basket, requesting orders in epic from surgeon.  

## 2022-09-13 ENCOUNTER — Ambulatory Visit: Payer: Self-pay | Admitting: Student

## 2022-09-13 NOTE — H&P (Signed)
TOTAL KNEE ADMISSION H&P  Patient is being admitted for right total knee arthroplasty.  Subjective:  Chief Complaint:right knee pain.  HPI: Randy LericheMark Greene, 57 y.o. male, has a history of pain and functional disability in the right knee due to arthritis and has failed non-surgical conservative treatments for greater than 12 weeks to includeNSAID's and/or analgesics, corticosteriod injections, viscosupplementation injections, flexibility and strengthening excercises, use of assistive devices, and activity modification.  Onset of symptoms was gradual, starting 10 years ago with rapidlly worsening course since that time. The patient noted prior procedures on the knee to include  arthroscopy on the right knee(s).  Patient currently rates pain in the right knee(s) at 10 out of 10 with activity. Patient has night pain, worsening of pain with activity and weight bearing, pain that interferes with activities of daily living, pain with passive range of motion, crepitus, and joint swelling.  Patient has evidence of subchondral cysts, subchondral sclerosis, periarticular osteophytes, and joint space narrowing by imaging studies. There is no active infection.  Patient Active Problem List   Diagnosis Date Noted   COPD (chronic obstructive pulmonary disease) 03/06/2021   Acute on chronic respiratory failure with hypoxia 01/23/2021   NSIP (nonspecific interstitial pneumonitis) 01/23/2021   Abnormal CT of the chest 10/22/2020   Degenerative arthritis of left knee 08/11/2016   Osteoarthritis of left knee 08/10/2016   Agitation 03/01/2014   Chronic hypoxemic respiratory failure (HCC) 01/01/2014   Metabolic alkalosis with chronic respiratory acidosis 01/01/2014   OSA on CPAP 01/01/2014   Acid reflux 07/17/2013   Transaminitis 11/29/2012   MS (multiple sclerosis) (HCC) 11/29/2012   Recurrent aspiration bronchitis/pneumonia 11/30/2011   Past Medical History:  Diagnosis Date   Acid reflux    Arthritis     Aspiration pneumonia    Asthma    COPD (chronic obstructive pulmonary disease)    Delayed wound healing    left knee   Dyspnea    Enlarged prostate    Hypercapnic respiratory failure, chronic    Hypertension    MS (multiple sclerosis)     Past Surgical History:  Procedure Laterality Date   ARTHROSCOPIC Left    BRONCHIAL WASHINGS  01/14/2021   Procedure: BRONCHIAL WASHINGS;  Surgeon: Lorin GlassSmith, Daniel C, MD;  [facility];  Service: Pulmonary;;   HERNIA REPAIR     INCISION AND DRAINAGE Left 01/04/2017   Procedure: INCISION AND DRAINAGE LEFT KNEE;  Surgeon: Samson FredericSwinteck, Brian, MD;  Location: [facility] OR;  Service: Orthopedics;  Laterality: Left;   jawsurgery     KNEE ARTHROPLASTY Left 08/10/2016   Procedure: LEFT TOTAL KNEE ARTHROPLASTY WITH COMPUTER NAVIGATION;  Surgeon: Samson FredericBrian Swinteck, MD;  Location: [facility] OR;  Service: Orthopedics;  Laterality: Left;   knees scope Right    TONSILLECTOMY AND ADENOIDECTOMY     VIDEO BRONCHOSCOPY Bilateral 01/14/2021   Procedure: VIDEO BRONCHOSCOPY WITHOUT FLUORO;  Surgeon: Lorin GlassSmith, Daniel C, MD;  [facility];  Service: Pulmonary;  Laterality: Bilateral;    Current Outpatient Medications  Medication Sig Dispense Refill Last Dose   acetaminophen-codeine (TYLENOL #3) 300-30 MG tablet Take 1 tablet by mouth 2 (two) times daily as needed for moderate pain.      Acetylcysteine 600 MG CAPS Take 600 mg by mouth daily.      albuterol (PROVENTIL HFA;VENTOLIN HFA) 108 (90 BASE) MCG/ACT inhaler Inhale 2 puffs into the lungs every 6 (six) hours as needed for wheezing or shortness of breath.      alfuzosin (UROXATRAL) 10 MG 24 hr tablet  Take 10 mg by mouth every evening.      amphetamine-dextroamphetamine (ADDERALL XR) 30 MG 24 hr capsule Take 30 mg by mouth 2 (two) times daily.      aspirin EC 81 MG tablet Take 81 mg by mouth at bedtime. Swallow whole.      baclofen (LIORESAL) 10 MG tablet Take 15 mg by mouth 4 (four) times daily.       benzonatate  (TESSALON) 100 MG capsule Take 1 capsule (100 mg total) by mouth every 8 (eight) hours. (Patient not taking: Reported on 09/14/2022) 21 capsule 0    calcium carbonate (OS-CAL) 1250 (500 Ca) MG chewable tablet Chew 1 tablet by mouth 2 (two) times daily.      carboxymethylcellulose 1 % ophthalmic solution Place 1 drop into both eyes as needed (dry eyes).      cetirizine (ZYRTEC) 10 MG tablet Take 10 mg by mouth daily.      Cholecalciferol (VITAMIN D) 50 MCG (2000 UT) tablet Take 6,000 Units by mouth daily.      cyanocobalamin (,VITAMIN B-12,) 1000 MCG/ML injection Inject 1,000 mcg into the muscle every 28 (twenty-eight) days.      cyclobenzaprine (FLEXERIL) 10 MG tablet Take 10 mg by mouth 3 (three) times daily as needed for muscle spasms.      diclofenac Sodium (VOLTAREN) 1 % GEL Apply 2 g topically 2 (two) times daily as needed (pain).      donepezil (ARICEPT) 10 MG tablet Take 10 mg by mouth at bedtime.      dronabinol (MARINOL) 2.5 MG capsule Take 7.5 mg by mouth 3 (three) times daily.       esomeprazole (NEXIUM) 40 MG capsule Take 40 mg by mouth 2 (two) times daily before a meal.      ezetimibe (ZETIA) 10 MG tablet Take 10 mg by mouth daily.      famotidine (PEPCID) 40 MG tablet Take 40 mg by mouth at bedtime.      finasteride (PROSCAR) 5 MG tablet Take 5 mg by mouth in the morning.      fluorouracil (EFUDEX) 5 % cream Apply 1 application topically 2 (two) times daily.      fluticasone (FLONASE) 50 MCG/ACT nasal spray Place 1 spray into both nostrils daily.      Folic Acid-Vit B6-Vit B12 (FOLBEE) 2.5-25-1 MG TABS tablet Take 1 tablet by mouth daily.      guaiFENesin (MUCINEX) 600 MG 12 hr tablet Take 1 tablet (600 mg total) by mouth 2 (two) times daily. (Patient not taking: Reported on 09/14/2022) 30 tablet 0    guanFACINE (TENEX) 1 MG tablet Take 2 mg by mouth at bedtime.      indomethacin (INDOCIN) 50 MG capsule Take 1 capsule (50 mg total) by mouth 2 (two) times daily with a meal. (Patient  taking differently: Take 50 mg by mouth 2 (two) times daily as needed for moderate pain.) 14 capsule 0    ketotifen (ZADITOR) 0.025 % ophthalmic solution Place 1 drop into both eyes 2 (two) times daily as needed (itching).      magnesium (MAGTAB) 84 MG ( ) TBCR SR tablet Take 42 mg by mouth 2 (two) times daily.      metoCLOPramide (REGLAN) 10 MG tablet Take 10 mg by mouth at bedtime.      montelukast (SINGULAIR) 10 MG tablet Take 10 mg by mouth at bedtime.      naloxone (NARCAN) nasal spray 4 mg/0.1 mL Place 1 spray into the nose See  admin instructions. SPRAY 1 SPRAY INTO ONE NOSTRIL AS DIRECTED FOR OPIOID OVERDOSE (TURN PERSON ON SIDE AFTER DOSE. IF NO RESPONSE IN 2-3 MINUTES OR PERSON RESPONDS BUT RELAPSES, REPEAT USING A NEW SPRAY DEVICE AND SPRAY INTO THE OTHER NOSTRIL. CALL 911 AFTER USE.) * EMERGENCY USE ONLY *      Omega-3 Fatty Acids (FISH OIL) 1000 MG CAPS Take 1,000 mg by mouth in the morning, at noon, and at bedtime.      phenytoin (DILANTIN) 100 MG ER capsule Take 100 mg by mouth 3 (three) times daily.      pravastatin (PRAVACHOL) 10 MG tablet Take 10 mg by mouth daily.      pregabalin (LYRICA) 150 MG capsule Take 150-300 mg by mouth See admin instructions. Take 150 mg by mouth in the morning and afternoon and take 300 mg at bedtime      Probiotic Product (ALIGN) 4 MG CAPS Take 4 mg by mouth in the morning and at bedtime.      Tiotropium Bromide Monohydrate (SPIRIVA RESPIMAT) 2.5 MCG/ACT AERS Inhale 1 each into the lungs daily.      traMADol (ULTRAM) 50 MG tablet Take 1 tablet (50 mg total) by mouth 2 (two) times daily. 60 tablet 5    urea (CARMOL) 20 % lotion Apply topically as needed.      vitamin C (ASCORBIC ACID) 500 MG tablet Take 500 mg by mouth 2 (two) times daily.      vitamin E 180 MG (400 UNITS) capsule Take 400 Units by mouth daily.      Zinc 50 MG TABS Take 50 mg by mouth every other day.      No current facility-administered medications for this visit.   Allergies   Allergen Reactions   Penicillins Hives and Other (See Comments)    [ +FAMILY HISTORY] Has patient had a PCN reaction causing immediate rash, facial/tongue/throat swelling, SOB or lightheadedness with hypotension: Unknown Has patient had a PCN reaction causing severe rash involving mucus membranes or skin necrosis: Unknown Has patient had a PCN reaction that required hospitalization: No Has patient had a PCN reaction occurring within the last 10 years: No If all of the above answers are "NO", then may proceed with Cephalosporin use.     Tape Rash   Zoloft [Sertraline] Rash   Food Nausea And Vomiting and Other (See Comments)    GREEN PEPPERS- flushed  PT DOES NOT EAT MEAT   Morphine Itching   Morphine And Related Itching   Mushroom Extract Complex Nausea And Vomiting and Other (See Comments)    And flushed Other reaction(s): Other (See Comments) And flushed   Penicillin G Rash    Social History   Tobacco Use   Smoking status: Former    Packs/day: 1.00    Years: 20.00    Additional pack years: 0.00    Total pack years: 20.00    Types: Cigarettes    Quit date: 11/02/2006    Years since quitting: 15.9   Smokeless tobacco: Never  Substance Use Topics   Alcohol use: Yes    Comment: ocas.    Family History  Problem Relation Age of Onset   Dementia Mother    Hypertension Father    Heart attack Father      Review of Systems  Musculoskeletal:  Positive for arthralgias, gait problem and joint swelling.  All other systems reviewed and are negative.   Objective:  Physical Exam Constitutional:      Appearance: Normal appearance.  HENT:     Head: Normocephalic and atraumatic.     Nose: Nose normal.     Mouth/Throat:     Mouth: Mucous membranes are moist.     Pharynx: Oropharynx is clear.  Eyes:     Conjunctiva/sclera: Conjunctivae normal.  Cardiovascular:     Rate and Rhythm: Normal rate and regular rhythm.     Pulses: Normal pulses.     Heart sounds: Normal heart  sounds.  Pulmonary:     Effort: Pulmonary effort is normal.     Breath sounds: Normal breath sounds.  Abdominal:     General: Abdomen is flat.     Palpations: Abdomen is soft.  Genitourinary:    Comments: deferred Musculoskeletal:     Cervical back: Normal range of motion and neck supple.     Comments: Examination of the right knee reveals no skin wounds or lesions. He has significant medial joint line tenderness to palpation. He has peripatellar retinacular tenderness to palpation with a positive grind sign. He has patellofemoral crepitation with range of motion. ROM 5-120 without any ligamentous instability. Painless range of motion of the hip.  Neurovascular intact distally.  He ambulates with an antalgic gait.  Skin:    General: Skin is warm and dry.     Capillary Refill: Capillary refill takes less than 2 seconds.  Neurological:     General: No focal deficit present.     Mental Status: He is alert and oriented to person, place, and time.  Psychiatric:        Mood and Affect: Mood normal.        Behavior: Behavior normal.        Thought Content: Thought content normal.        Judgment: Judgment normal.     Vital signs in last 24 hours: @VSRANGES @  Labs:   Estimated body mass index is 26.83 kg/m as calculated from the following:   Height as of 09/21/22: 6\' 2"  (1.88 m).   Weight as of 09/21/22: 94.8 kg.   Imaging Review Plain radiographs demonstrate severe degenerative joint disease of the right knee(s). The overall alignment issignificant varus. The bone quality appears to be adequate for age and reported activity level.      Assessment/Plan:  End stage arthritis, right knee   The patient history, physical examination, clinical judgment of the provider and imaging studies are consistent with end stage degenerative joint disease of the right knee(s) and total knee arthroplasty is deemed medically necessary. The treatment options including medical management,  injection therapy arthroscopy and arthroplasty were discussed at length. The risks and benefits of total knee arthroplasty were presented and reviewed. The risks due to aseptic loosening, infection, stiffness, patella tracking problems, thromboembolic complications and other imponderables were discussed. The patient acknowledged the explanation, agreed to proceed with the plan and consent was signed. Patient is being admitted for inpatient treatment for surgery, pain control, PT, OT, prophylactic antibiotics, VTE prophylaxis, progressive ambulation and ADL's and discharge planning. The patient is planning to be discharged home with OPPT.   Therapy Plans: outpatient therapy. PT at [facility] 10/02/22.  Disposition: Home with sister Planned DVT Prophylaxis: aspirin 81mg  BID DME needed: Has rolling walker. Discussed to not use rollator after surgery. Hold on iceman today.  PCP: Cleared.  Neurology: Cleared.  TXA: IV Allergies:  - Penicillin - rash - Morphine - warm and flushing.  - Adhesive tape - rash - Seasonal allergies.  Anesthesia Concerns: None.  BMI: 29.4 Last  HgbA1c: 5.8 Other: - Aspirin 81mg  baseline.  - MS on Ocrevus infusions, last infusion 06/29/22. Continue to hold.  - History right knee scope Dr. Aundria Rud.  - Cortisone and visco.  - History left TKA.  - Oxycodone, zofran, methocarbamol. - 09/21/22: Hgb 12.4, K+ 3.9, Cr. 0.83.    Patient's anticipated LOS is less than 2 midnights, meeting these requirements: - Younger than 30 - Lives within 1 hour of care - Has a competent adult at home to recover with post-op recover - NO history of  - Chronic pain requiring opiods  - Diabetes  - Coronary Artery Disease  - Heart failure  - Heart attack  - Stroke  - DVT/VTE  - Cardiac arrhythmia  - Respiratory Failure/COPD  - Renal failure  - Anemia  - Advanced Liver disease

## 2022-09-13 NOTE — H&P (View-Only) (Signed)
TOTAL KNEE ADMISSION H&P  Patient is being admitted for right total knee arthroplasty.  Subjective:  Chief Complaint:right knee pain.  HPI: Randy Greene, 57 y.o. male, has a history of pain and functional disability in the right knee due to arthritis and has failed non-surgical conservative treatments for greater than 12 weeks to includeNSAID's and/or analgesics, corticosteriod injections, viscosupplementation injections, flexibility and strengthening excercises, use of assistive devices, and activity modification.  Onset of symptoms was gradual, starting 10 years ago with rapidlly worsening course since that time. The patient noted prior procedures on the knee to include  arthroscopy on the right knee(s).  Patient currently rates pain in the right knee(s) at 10 out of 10 with activity. Patient has night pain, worsening of pain with activity and weight bearing, pain that interferes with activities of daily living, pain with passive range of motion, crepitus, and joint swelling.  Patient has evidence of subchondral cysts, subchondral sclerosis, periarticular osteophytes, and joint space narrowing by imaging studies. There is no active infection.  Patient Active Problem List   Diagnosis Date Noted   COPD (chronic obstructive pulmonary disease) 03/06/2021   Acute on chronic respiratory failure with hypoxia 01/23/2021   NSIP (nonspecific interstitial pneumonitis) 01/23/2021   Abnormal CT of the chest 10/22/2020   Degenerative arthritis of left knee 08/11/2016   Osteoarthritis of left knee 08/10/2016   Agitation 03/01/2014   Chronic hypoxemic respiratory failure (HCC) 01/01/2014   Metabolic alkalosis with chronic respiratory acidosis 01/01/2014   OSA on CPAP 01/01/2014   Acid reflux 07/17/2013   Transaminitis 11/29/2012   MS (multiple sclerosis) (HCC) 11/29/2012   Recurrent aspiration bronchitis/pneumonia 11/30/2011   Past Medical History:  Diagnosis Date   Acid reflux    Arthritis     Aspiration pneumonia    Asthma    COPD (chronic obstructive pulmonary disease)    Delayed wound healing    left knee   Dyspnea    Enlarged prostate    Hypercapnic respiratory failure, chronic    Hypertension    MS (multiple sclerosis)     Past Surgical History:  Procedure Laterality Date   ARTHROSCOPIC Left    BRONCHIAL WASHINGS  01/14/2021   Procedure: BRONCHIAL WASHINGS;  Surgeon: Lorin GlassSmith, Daniel C, MD;  [facility];  Service: Pulmonary;;   HERNIA REPAIR     INCISION AND DRAINAGE Left 01/04/2017   Procedure: INCISION AND DRAINAGE LEFT KNEE;  Surgeon: Samson FredericSwinteck, Brian, MD;  Location: [facility] OR;  Service: Orthopedics;  Laterality: Left;   jawsurgery     KNEE ARTHROPLASTY Left 08/10/2016   Procedure: LEFT TOTAL KNEE ARTHROPLASTY WITH COMPUTER NAVIGATION;  Surgeon: Samson FredericBrian Swinteck, MD;  Location: [facility] OR;  Service: Orthopedics;  Laterality: Left;   knees scope Right    TONSILLECTOMY AND ADENOIDECTOMY     VIDEO BRONCHOSCOPY Bilateral 01/14/2021   Procedure: VIDEO BRONCHOSCOPY WITHOUT FLUORO;  Surgeon: Lorin GlassSmith, Daniel C, MD;  [facility];  Service: Pulmonary;  Laterality: Bilateral;    Current Outpatient Medications  Medication Sig Dispense Refill Last Dose   acetaminophen-codeine (TYLENOL #3) 300-30 MG tablet Take 1 tablet by mouth 2 (two) times daily as needed for moderate pain.      Acetylcysteine 600 MG CAPS Take 600 mg by mouth daily.      albuterol (PROVENTIL HFA;VENTOLIN HFA) 108 (90 BASE) MCG/ACT inhaler Inhale 2 puffs into the lungs every 6 (six) hours as needed for wheezing or shortness of breath.      alfuzosin (UROXATRAL) 10 MG 24 hr tablet  Take 10 mg by mouth every evening.      amphetamine-dextroamphetamine (ADDERALL XR) 30 MG 24 hr capsule Take 30 mg by mouth 2 (two) times daily.      aspirin EC 81 MG tablet Take 81 mg by mouth at bedtime. Swallow whole.      baclofen (LIORESAL) 10 MG tablet Take 15 mg by mouth 4 (four) times daily.       benzonatate  (TESSALON) 100 MG capsule Take 1 capsule (100 mg total) by mouth every 8 (eight) hours. (Patient not taking: Reported on 09/14/2022) 21 capsule 0    calcium carbonate (OS-CAL) 1250 (500 Ca) MG chewable tablet Chew 1 tablet by mouth 2 (two) times daily.      carboxymethylcellulose 1 % ophthalmic solution Place 1 drop into both eyes as needed (dry eyes).      cetirizine (ZYRTEC) 10 MG tablet Take 10 mg by mouth daily.      Cholecalciferol (VITAMIN D) 50 MCG (2000 UT) tablet Take 6,000 Units by mouth daily.      cyanocobalamin (,VITAMIN B-12,) 1000 MCG/ML injection Inject 1,000 mcg into the muscle every 28 (twenty-eight) days.      cyclobenzaprine (FLEXERIL) 10 MG tablet Take 10 mg by mouth 3 (three) times daily as needed for muscle spasms.      diclofenac Sodium (VOLTAREN) 1 % GEL Apply 2 g topically 2 (two) times daily as needed (pain).      donepezil (ARICEPT) 10 MG tablet Take 10 mg by mouth at bedtime.      dronabinol (MARINOL) 2.5 MG capsule Take 7.5 mg by mouth 3 (three) times daily.       esomeprazole (NEXIUM) 40 MG capsule Take 40 mg by mouth 2 (two) times daily before a meal.      ezetimibe (ZETIA) 10 MG tablet Take 10 mg by mouth daily.      famotidine (PEPCID) 40 MG tablet Take 40 mg by mouth at bedtime.      finasteride (PROSCAR) 5 MG tablet Take 5 mg by mouth in the morning.      fluorouracil (EFUDEX) 5 % cream Apply 1 application topically 2 (two) times daily.      fluticasone (FLONASE) 50 MCG/ACT nasal spray Place 1 spray into both nostrils daily.      Folic Acid-Vit B6-Vit B12 (FOLBEE) 2.5-25-1 MG TABS tablet Take 1 tablet by mouth daily.      guaiFENesin (MUCINEX) 600 MG 12 hr tablet Take 1 tablet (600 mg total) by mouth 2 (two) times daily. (Patient not taking: Reported on 09/14/2022) 30 tablet 0    guanFACINE (TENEX) 1 MG tablet Take 2 mg by mouth at bedtime.      indomethacin (INDOCIN) 50 MG capsule Take 1 capsule (50 mg total) by mouth 2 (two) times daily with a meal. (Patient  taking differently: Take 50 mg by mouth 2 (two) times daily as needed for moderate pain.) 14 capsule 0    ketotifen (ZADITOR) 0.025 % ophthalmic solution Place 1 drop into both eyes 2 (two) times daily as needed (itching).      magnesium (MAGTAB) 84 MG ( ) TBCR SR tablet Take 42 mg by mouth 2 (two) times daily.      metoCLOPramide (REGLAN) 10 MG tablet Take 10 mg by mouth at bedtime.      montelukast (SINGULAIR) 10 MG tablet Take 10 mg by mouth at bedtime.      naloxone (NARCAN) nasal spray 4 mg/0.1 mL Place 1 spray into the nose See  admin instructions. SPRAY 1 SPRAY INTO ONE NOSTRIL AS DIRECTED FOR OPIOID OVERDOSE (TURN PERSON ON SIDE AFTER DOSE. IF NO RESPONSE IN 2-3 MINUTES OR PERSON RESPONDS BUT RELAPSES, REPEAT USING A NEW SPRAY DEVICE AND SPRAY INTO THE OTHER NOSTRIL. CALL 911 AFTER USE.) * EMERGENCY USE ONLY *      Omega-3 Fatty Acids (FISH OIL) 1000 MG CAPS Take 1,000 mg by mouth in the morning, at noon, and at bedtime.      phenytoin (DILANTIN) 100 MG ER capsule Take 100 mg by mouth 3 (three) times daily.      pravastatin (PRAVACHOL) 10 MG tablet Take 10 mg by mouth daily.      pregabalin (LYRICA) 150 MG capsule Take 150-300 mg by mouth See admin instructions. Take 150 mg by mouth in the morning and afternoon and take 300 mg at bedtime      Probiotic Product (ALIGN) 4 MG CAPS Take 4 mg by mouth in the morning and at bedtime.      Tiotropium Bromide Monohydrate (SPIRIVA RESPIMAT) 2.5 MCG/ACT AERS Inhale 1 each into the lungs daily.      traMADol (ULTRAM) 50 MG tablet Take 1 tablet (50 mg total) by mouth 2 (two) times daily. 60 tablet 5    urea (CARMOL) 20 % lotion Apply topically as needed.      vitamin C (ASCORBIC ACID) 500 MG tablet Take 500 mg by mouth 2 (two) times daily.      vitamin E 180 MG (400 UNITS) capsule Take 400 Units by mouth daily.      Zinc 50 MG TABS Take 50 mg by mouth every other day.      No current facility-administered medications for this visit.   Allergies   Allergen Reactions   Penicillins Hives and Other (See Comments)    [ +FAMILY HISTORY] Has patient had a PCN reaction causing immediate rash, facial/tongue/throat swelling, SOB or lightheadedness with hypotension: Unknown Has patient had a PCN reaction causing severe rash involving mucus membranes or skin necrosis: Unknown Has patient had a PCN reaction that required hospitalization: No Has patient had a PCN reaction occurring within the last 10 years: No If all of the above answers are "NO", then may proceed with Cephalosporin use.     Tape Rash   Zoloft [Sertraline] Rash   Food Nausea And Vomiting and Other (See Comments)    GREEN PEPPERS- flushed  PT DOES NOT EAT MEAT   Morphine Itching   Morphine And Related Itching   Mushroom Extract Complex Nausea And Vomiting and Other (See Comments)    And flushed Other reaction(s): Other (See Comments) And flushed   Penicillin G Rash    Social History   Tobacco Use   Smoking status: Former    Packs/day: 1.00    Years: 20.00    Additional pack years: 0.00    Total pack years: 20.00    Types: Cigarettes    Quit date: 11/02/2006    Years since quitting: 15.9   Smokeless tobacco: Never  Substance Use Topics   Alcohol use: Yes    Comment: ocas.    Family History  Problem Relation Age of Onset   Dementia Mother    Hypertension Father    Heart attack Father      Review of Systems  Musculoskeletal:  Positive for arthralgias, gait problem and joint swelling.  All other systems reviewed and are negative.   Objective:  Physical Exam Constitutional:      Appearance: Normal appearance.  HENT:     Head: Normocephalic and atraumatic.     Nose: Nose normal.     Mouth/Throat:     Mouth: Mucous membranes are moist.     Pharynx: Oropharynx is clear.  Eyes:     Conjunctiva/sclera: Conjunctivae normal.  Cardiovascular:     Rate and Rhythm: Normal rate and regular rhythm.     Pulses: Normal pulses.     Heart sounds: Normal heart  sounds.  Pulmonary:     Effort: Pulmonary effort is normal.     Breath sounds: Normal breath sounds.  Abdominal:     General: Abdomen is flat.     Palpations: Abdomen is soft.  Genitourinary:    Comments: deferred Musculoskeletal:     Cervical back: Normal range of motion and neck supple.     Comments: Examination of the right knee reveals no skin wounds or lesions. He has significant medial joint line tenderness to palpation. He has peripatellar retinacular tenderness to palpation with a positive grind sign. He has patellofemoral crepitation with range of motion. ROM 5-120 without any ligamentous instability. Painless range of motion of the hip.  Neurovascular intact distally.  He ambulates with an antalgic gait.  Skin:    General: Skin is warm and dry.     Capillary Refill: Capillary refill takes less than 2 seconds.  Neurological:     General: No focal deficit present.     Mental Status: He is alert and oriented to person, place, and time.  Psychiatric:        Mood and Affect: Mood normal.        Behavior: Behavior normal.        Thought Content: Thought content normal.        Judgment: Judgment normal.     Vital signs in last 24 hours: @VSRANGES @  Labs:   Estimated body mass index is 26.83 kg/m as calculated from the following:   Height as of 09/21/22: 6\' 2"  (1.88 m).   Weight as of 09/21/22: 94.8 kg.   Imaging Review Plain radiographs demonstrate severe degenerative joint disease of the right knee(s). The overall alignment issignificant varus. The bone quality appears to be adequate for age and reported activity level.      Assessment/Plan:  End stage arthritis, right knee   The patient history, physical examination, clinical judgment of the provider and imaging studies are consistent with end stage degenerative joint disease of the right knee(s) and total knee arthroplasty is deemed medically necessary. The treatment options including medical management,  injection therapy arthroscopy and arthroplasty were discussed at length. The risks and benefits of total knee arthroplasty were presented and reviewed. The risks due to aseptic loosening, infection, stiffness, patella tracking problems, thromboembolic complications and other imponderables were discussed. The patient acknowledged the explanation, agreed to proceed with the plan and consent was signed. Patient is being admitted for inpatient treatment for surgery, pain control, PT, OT, prophylactic antibiotics, VTE prophylaxis, progressive ambulation and ADL's and discharge planning. The patient is planning to be discharged home with OPPT.   Therapy Plans: outpatient therapy. PT at [facility] 10/02/22.  Disposition: Home with sister Planned DVT Prophylaxis: aspirin 81mg  BID DME needed: Has rolling walker. Discussed to not use rollator after surgery. Hold on iceman today.  PCP: Cleared.  Neurology: Cleared.  TXA: IV Allergies:  - Penicillin - rash - Morphine - warm and flushing.  - Adhesive tape - rash - Seasonal allergies.  Anesthesia Concerns: None.  BMI: 29.4 Last  HgbA1c: 5.8 Other: - Aspirin 81mg  baseline.  - MS on Ocrevus infusions, last infusion 06/29/22. Continue to hold.  - History right knee scope Dr. Aundria Rud.  - Cortisone and visco.  - History left TKA.  - Oxycodone, zofran, methocarbamol. - 09/21/22: Hgb 12.4, K+ 3.9, Cr. 0.83.    Patient's anticipated LOS is less than 2 midnights, meeting these requirements: - Younger than 30 - Lives within 1 hour of care - Has a competent adult at home to recover with post-op recover - NO history of  - Chronic pain requiring opiods  - Diabetes  - Coronary Artery Disease  - Heart failure  - Heart attack  - Stroke  - DVT/VTE  - Cardiac arrhythmia  - Respiratory Failure/COPD  - Renal failure  - Anemia  - Advanced Liver disease

## 2022-09-14 NOTE — Progress Notes (Addendum)
Anesthesia Review:  PCP: Randy Greene LOV 04/24/22 on chart  Randy Greene-  Neurology Clearance dated 07/02/22 n Cardiologist : Chest x-ray : EKG : 01/24/21  Echo : Stress test: Cardiac Cath :  Activity level:  Sleep Study/ CPAP : Fasting Blood Sugar :      / Checks Blood Sugar -- times a day:   Blood Thinner/ Instructions /Last Dose: ASA / Instructions/ Last Dose :     PT has MS.

## 2022-09-15 NOTE — Patient Instructions (Addendum)
SURGICAL WAITING ROOM VISITATION  Patients having surgery or a procedure may have no more than 2 support people in the waiting area - these visitors may rotate.    Children under the age of 21 must have an adult with them who is not the patient.  Due to an increase in RSV and influenza rates and associated hospitalizations, children ages 13 and under may not visit patients in [place].  If the patient needs to stay at the hospital during part of their recovery, the visitor guidelines for inpatient rooms apply. Pre-op nurse will coordinate an appropriate time for 1 support person to accompany patient in pre-op.  This support person may not rotate.    Please refer to the [facility] website for the visitor guidelines for Inpatients (after your surgery is over and you are in a regular room).       Your procedure is scheduled on:    09/30/2022    Report to [facility] Main Entrance    Report to admitting at   0600 AM   Call this number if you have problems the morning of surgery [phone]   Do not eat food :After Midnight.   After Midnight you may have the following liquids until _0530_____ AM  DAY OF SURGERY  Water Non-Citrus Juices (without pulp, NO RED-Apple, White grape, White cranberry) Black Coffee (NO MILK/CREAM OR CREAMERS, sugar ok)  Clear Tea (NO MILK/CREAM OR CREAMERS, sugar ok) regular and decaf                             Plain Jell-O (NO RED)                                           Fruit ices (not with fruit pulp, NO RED)                                     Popsicles (NO RED)                                                               Sports drinks like Gatorade (NO RED)                     The day of surgery:  Drink ONE (1) Pre-Surgery Clear Ensure or G2 at  0530 AM  ( have completed by ) the morning of surgery. Drink in one sitting. Do not sip.  This drink was given to you during your hospital  pre-op appointment visit. Nothing else  to drink after completing the  Pre-Surgery Clear Ensure or G2.          If you have questions, please contact your surgeon's office.     Oral Hygiene is also important to reduce your risk of infection.                                    Remember - BRUSH YOUR TEETH THE MORNING OF  SURGERY WITH YOUR REGULAR TOOTHPASTE  DENTURES WILL BE REMOVED PRIOR TO SURGERY PLEASE DO NOT APPLY "Poly grip" OR ADHESIVES!!!   Do NOT smoke after Midnight   Take these medicines the morning of surgery with A SIP OF WATER:  Inhalers as usual and bring, zyrtec, nexium, proscar, flonase, dilantin, lyrica, spiriva   DO NOT TAKE ANY ORAL DIABETIC MEDICATIONS DAY OF YOUR SURGERY  Bring CPAP mask and tubing day of surgery.                              You may not have any metal on your body including hair pins, jewelry, and body piercing             Do not wear make-up, lotions, powders, perfumes/cologne, or deodorant  Do not wear nail polish including gel and S&S, artificial/acrylic nails, or any other type of covering on natural nails including finger and toenails. If you have artificial nails, gel coating, etc. that needs to be removed by a nail salon please have this removed prior to surgery or surgery may need to be canceled/ delayed if the surgeon/ anesthesia feels like they are unable to be safely monitored.   Do not shave  48 hours prior to surgery.               Men may shave face and neck.   Do not bring valuables to the hospital. [place].   Contacts, glasses, dentures or bridgework may not be worn into surgery.   Bring small overnight bag day of surgery.   DO NOT [place]. PHARMACY WILL DISPENSE MEDICATIONS LISTED ON YOUR MEDICATION LIST TO YOU DURING YOUR ADMISSION [place]!    Patients discharged on the day of surgery will not be allowed to drive home.  Someone NEEDS to stay with you for the first 24  hours after anesthesia.   Special Instructions: Bring a copy of your healthcare power of attorney and living will documents the day of surgery if you haven't scanned them before.              Please read over the following fact sheets you were given: IF [place] [phone]   If you received a COVID test during your pre-op visit  it is requested that you wear a mask when out in public, stay away from anyone that may not be feeling well and notify your surgeon if you develop symptoms. If you test positive for Covid or have been in contact with anyone that has tested positive in the last 10 days please notify you surgeon.    [facility] - Preparing for Surgery Before surgery, you can play an important role.  Because skin is not sterile, your skin needs to be as free of germs as possible.  You can reduce the number of germs on your skin by washing with CHG (chlorahexidine gluconate) soap before surgery.  CHG is an antiseptic cleaner which kills germs and bonds with the skin to continue killing germs even after washing. Please DO NOT use if you have an allergy to CHG or antibacterial soaps.  If your skin becomes reddened/irritated stop using the CHG and inform your nurse when you arrive at Short Stay. Do not shave (including legs  and underarms) for at least 48 hours prior to the first CHG shower.  You may shave your face/neck. Please follow these instructions carefully:  1.  Shower with CHG Soap starting 5 days prior to surgery.  Shower with hibiclens the am of surgery.  Place clean sheets on bed when start hibiclens showers.     2.  If you choose to wash your hair, wash your hair first as usual with your  normal  shampoo.  3.  After you shampoo, rinse your hair and body thoroughly to remove the  shampoo.                           4.  Use CHG as you would any other liquid soap.  You can apply chg directly  to the skin and wash                        Gently with a scrungie or clean washcloth.  5.  Apply the CHG Soap to your body ONLY FROM THE NECK DOWN.   Do not use on face/ open                           Wound or open sores. Avoid contact with eyes, ears mouth and genitals (private parts).                       Wash face,  Genitals (private parts) with your normal soap.             6.  Wash thoroughly, paying special attention to the area where your surgery  will be performed.  7.  Thoroughly rinse your body with warm water from the neck down.  8.  DO NOT shower/wash with your normal soap after using and rinsing off  the CHG Soap.                9.  Pat yourself dry with a clean towel.            10.  Wear clean pajamas.            11.  Place clean sheets on your bed the night of your first shower and do not  sleep with pets. Day of Surgery : Do not apply any lotions/deodorants the morning of surgery.  Please wear clean clothes to the hospital/surgery center.  FAILURE TO FOLLOW THESE INSTRUCTIONS MAY RESULT IN THE CANCELLATION OF YOUR SURGERY PATIENT SIGNATURE_________________________________  NURSE SIGNATURE__________________________________  ________________________________________________________________________

## 2022-09-16 ENCOUNTER — Encounter (HOSPITAL_COMMUNITY)
Admission: RE | Admit: 2022-09-16 | Discharge: 2022-09-16 | Disposition: A | Discharge status: Home / Self Care | Payer: No Typology Code available for payment source | Source: Ambulatory Visit | Attending: General Practice | Admitting: General Practice

## 2022-09-17 NOTE — Patient Instructions (Signed)
DUE TO COVID-19 ONLY TWO VISITORS  (aged 59 and older)  ARE ALLOWED TO COME WITH YOU AND STAY IN THE WAITING ROOM ONLY DURING PRE OP AND PROCEDURE.   **NO VISITORS ARE ALLOWED IN THE SHORT STAY AREA OR RECOVERY ROOM!!**  IF YOU WILL BE ADMITTED INTO THE HOSPITAL YOU ARE ALLOWED ONLY FOUR SUPPORT PEOPLE DURING VISITATION HOURS ONLY (7 AM -8PM)   The support person(s) must pass our screening, gel in and out, and wear a mask at all times, including in the patient's room. Patients must also wear a mask when staff or their support person are in the room. Visitors GUEST BADGE MUST BE WORN VISIBLY  One adult visitor may remain with you overnight and MUST be in the room by 8 P.M.     Your procedure is scheduled on: 09/30/22   Report to [facility] Main Entrance    Report to admitting at : 6:00 AM   Call this number if you have problems the morning of surgery [phone]   Do not eat food :After Midnight.   After Midnight you may have the following liquids until: 5:30 AM DAY OF SURGERY  Water Black Coffee (sugar ok, NO MILK/CREAM OR CREAMERS)  Tea (sugar ok, NO MILK/CREAM OR CREAMERS) regular and decaf                             Plain Jell-O (NO RED)                                           Fruit ices (not with fruit pulp, NO RED)                                     Popsicles (NO RED)                                                                  Juice: apple, WHITE grape, WHITE cranberry Sports drinks like Gatorade (NO RED)   The day of surgery:  Drink ONE (1) Pre-Surgery Clear Ensure at : 5:30 AM the morning of surgery. Drink in one sitting. Do not sip.  This drink was given to you during your hospital  pre-op appointment visit. Nothing else to drink after completing the  Pre-Surgery Clear Ensure or G2.          If you have questions, please contact your surgeon's office.  Oral Hygiene is also important to reduce your risk of infection.                                     Remember - BRUSH YOUR TEETH THE MORNING OF SURGERY WITH YOUR REGULAR TOOTHPASTE  DENTURES WILL BE REMOVED PRIOR TO SURGERY PLEASE DO NOT APPLY "Poly grip" OR ADHESIVES!!!   Do NOT smoke after Midnight   Take these medicines the morning of surgery with A SIP OF WATER: phenytoin,pregabalin,cetirizine,esomeprazole.Use inhalers as usual.Tylenol as needed.  Bring CPAP mask  and tubing day of surgery.                              You may not have any metal on your body including hair pins, jewelry, and body piercing             Do not wear lotions, powders, perfumes/cologne, or deodorant              Men may shave face and neck.   Do not bring valuables to the hospital. [place].   Contacts, glasses, or bridgework may not be worn into surgery.   Bring small overnight bag day of surgery.   DO NOT [place]. PHARMACY WILL DISPENSE MEDICATIONS LISTED ON YOUR MEDICATION LIST TO YOU DURING YOUR ADMISSION [place]!    Patients discharged on the day of surgery will not be allowed to drive home.  Someone NEEDS to stay with you for the first 24 hours after anesthesia.   Special Instructions: Bring a copy of your healthcare power of attorney and living will documents         the day of surgery if you haven't scanned them before.              Please read over the following fact sheets you were given: IF YOU HAVE QUESTIONS ABOUT YOUR PRE-OP INSTRUCTIONS PLEASE CALL [phone]    [facility] - Preparing for Surgery Before surgery, you can play an important role.  Because skin is not sterile, your skin needs to be as free of germs as possible.  You can reduce the number of germs on your skin by washing with CHG (chlorahexidine gluconate) soap before surgery.  CHG is an antiseptic cleaner which kills germs and bonds with the skin to continue killing germs even after washing. Please DO NOT use if you have an  allergy to CHG or antibacterial soaps.  If your skin becomes reddened/irritated stop using the CHG and inform your nurse when you arrive at Short Stay. Do not shave (including legs and underarms) for at least 48 hours prior to the first CHG shower.  You may shave your face/neck. Please follow these instructions carefully:  1.  Shower with CHG Soap the night before surgery and the  morning of Surgery.  2.  If you choose to wash your hair, wash your hair first as usual with your  normal  shampoo.  3.  After you shampoo, rinse your hair and body thoroughly to remove the  shampoo.                           4.  Use CHG as you would any other liquid soap.  You can apply chg directly  to the skin and wash                       Gently with a scrungie or clean washcloth.  5.  Apply the CHG Soap to your body ONLY FROM THE NECK DOWN.   Do not use on face/ open                           Wound or open sores. Avoid contact with eyes, ears mouth  and genitals (private parts).                       Wash face,  Genitals (private parts) with your normal soap.             6.  Wash thoroughly, paying special attention to the area where your surgery  will be performed.  7.  Thoroughly rinse your body with warm water from the neck down.  8.  DO NOT shower/wash with your normal soap after using and rinsing off  the CHG Soap.                9.  Pat yourself dry with a clean towel.            10.  Wear clean pajamas.            11.  Place clean sheets on your bed the night of your first shower and do not  sleep with pets. Day of Surgery : Do not apply any lotions/deodorants the morning of surgery.  Please wear clean clothes to the hospital/surgery center.  FAILURE TO FOLLOW THESE INSTRUCTIONS MAY RESULT IN THE CANCELLATION OF YOUR SURGERY PATIENT SIGNATURE_________________________________  NURSE  SIGNATURE__________________________________  ________________________________________________________________________  Adam Phenix  An incentive spirometer is a tool that can help keep your lungs clear and active. This tool measures how well you are filling your lungs with each breath. Taking long deep breaths may help reverse or decrease the chance of developing breathing (pulmonary) problems (especially infection) following: A long period of time when you are unable to move or be active. BEFORE THE PROCEDURE  If the spirometer includes an indicator to show your best effort, your nurse or respiratory therapist will set it to a desired goal. If possible, sit up straight or lean slightly forward. Try not to slouch. Hold the incentive spirometer in an upright position. INSTRUCTIONS FOR USE  Sit on the edge of your bed if possible, or sit up as far as you can in bed or on a chair. Hold the incentive spirometer in an upright position. Breathe out normally. Place the mouthpiece in your mouth and seal your lips tightly around it. Breathe in slowly and as deeply as possible, raising the piston or the ball toward the top of the column. Hold your breath for 3-5 seconds or for as long as possible. Allow the piston or ball to fall to the bottom of the column. Remove the mouthpiece from your mouth and breathe out normally. Rest for a few seconds and repeat Steps 1 through 7 at least 10 times every 1-2 hours when you are awake. Take your time and take a few normal breaths between deep breaths. The spirometer may include an indicator to show your best effort. Use the indicator as a goal to work toward during each repetition. After each set of 10 deep breaths, practice coughing to be sure your lungs are clear. If you have an incision (the cut made at the time of surgery), support your incision when coughing by placing a pillow or rolled up towels firmly against it. Once you are able to get out of  bed, walk around indoors and cough well. You may stop using the incentive spirometer when instructed by your caregiver.  RISKS AND COMPLICATIONS Take your time so you do not get dizzy or light-headed. If you are in pain, you may need to take or ask for pain medication before doing incentive spirometry. It is harder  to take a deep breath if you are having pain. AFTER USE Rest and breathe slowly and easily. It can be helpful to keep track of a log of your progress. Your caregiver can provide you with a simple table to help with this. If you are using the spirometer at home, follow these instructions: [place] IF:  You are having difficultly using the spirometer. You have trouble using the spirometer as often as instructed. Your pain medication is not giving enough relief while using the spirometer. You develop fever of 100.5 F (38.1 C) or higher. SEEK IMMEDIATE MEDICAL CARE IF:  You cough up bloody sputum that had not been present before. You develop fever of 102 F (38.9 C) or greater. You develop worsening pain at or near the incision site. MAKE SURE YOU:  Understand these instructions. Will watch your condition. Will get help right away if you are not doing well or get worse. Document Released: 10/19/2006 Document Revised: 08/31/2011 Document Reviewed: 12/20/2006 [facility] Patient Information 2014 [place], [place].   ________________________________________________________________________

## 2022-09-21 ENCOUNTER — Encounter (HOSPITAL_COMMUNITY): Payer: Self-pay

## 2022-09-21 ENCOUNTER — Other Ambulatory Visit: Payer: Self-pay

## 2022-09-21 ENCOUNTER — Encounter (HOSPITAL_COMMUNITY)
Admission: RE | Admit: 2022-09-21 | Discharge: 2022-09-21 | Disposition: A | Discharge status: Home / Self Care | Payer: No Typology Code available for payment source | Source: Ambulatory Visit | Attending: Orthopedic Surgery | Admitting: Orthopedic Surgery

## 2022-09-21 VITALS — BP 135/82 | HR 85 | Temp 98.6°F | Ht 74.0 in | Wt 209.0 lb

## 2022-09-21 DIAGNOSIS — I2510 Atherosclerotic heart disease of native coronary artery without angina pectoris: Secondary | ICD-10-CM

## 2022-09-21 DIAGNOSIS — Z01812 Encounter for preprocedural laboratory examination: Secondary | ICD-10-CM | POA: Diagnosis present

## 2022-09-21 DIAGNOSIS — Z01818 Encounter for other preprocedural examination: Secondary | ICD-10-CM

## 2022-09-21 HISTORY — DX: Chronic obstructive pulmonary disease, unspecified: J44.9

## 2022-09-21 LAB — BASIC METABOLIC PANEL
Anion gap: 6 (ref 5–15)
BUN: 14 mg/dL (ref 6–20)
CO2: 25 mmol/L (ref 22–32)
Calcium: 9 mg/dL (ref 8.9–10.3)
Chloride: 105 mmol/L (ref 98–111)
Creatinine, Ser: 0.83 mg/dL (ref 0.61–1.24)
GFR, Estimated: >60 mL/min (ref >60)
Glucose, Bld: 105 mg/dL — ABNORMAL HIGH (ref 70–99)
Potassium: 3.9 mmol/L (ref 3.5–5.1)
Sodium: 136 mmol/L (ref 135–145)

## 2022-09-21 LAB — CBC
HCT: 39.1 % (ref 39.0–52.0)
Hemoglobin: 12.4 g/dL — ABNORMAL LOW (ref 13.0–17.0)
MCH: 27.7 pg (ref 26.0–34.0)
MCHC: 31.7 g/dL (ref 30.0–36.0)
MCV: 87.3 fL (ref 80.0–100.0)
Platelets: 256 10*3/uL (ref 150–400)
RBC: 4.48 MIL/uL (ref 4.22–5.81)
RDW: 13.9 % (ref 11.5–15.5)
WBC: 7.4 10*3/uL (ref 4.0–10.5)
nRBC: 0 % (ref 0.0–0.2)

## 2022-09-21 NOTE — Progress Notes (Addendum)
For Short Stay: [name] appointment date:  Bowel Prep reminder:   For Anesthesia: PCP - [facility]. Clearance: 07/13/22: chart. Cardiologist -  Nigel Sloop- Neurology Clearance dated 07/02/22  Chest x-ray -  EKG - requested Stress Test -  ECHO - 01/24/21 Cardiac Cath -  Pacemaker/ICD device last checked: Pacemaker orders received: Device Rep notified:  Spinal Cord Stimulator:  Sleep Study - Yes CPAP - Yes  Fasting Blood Sugar -  Checks Blood Sugar _____ times a day Date and result of last Hgb A1c-5.8: 07/13/22  Last dose of GLP1 agonist-  GLP1 instructions:   Last dose of SGLT-2 inhibitors-  SGLT-2 instructions:   Blood Thinner Instructions: Aspirin Instructions: Last Dose:  Activity level: Can go up a flight of stairs and activities of daily living without stopping and without chest pain and/or shortness of breath   Able to exercise without chest pain and/or shortness of breath  Anesthesia review: Hx: HTN,COPD,OSA(CPAP).  Patient denies shortness of breath, fever, cough and chest pain at PAT appointment   Patient verbalized understanding of instructions that were given to them at the PAT appointment. Patient was also instructed that they will need to review over the PAT instructions again at home before surgery.

## 2022-09-22 LAB — SURGICAL PCR SCREEN
MRSA, PCR: POSITIVE — AB
Staphylococcus aureus: POSITIVE — AB

## 2022-09-22 NOTE — Progress Notes (Signed)
PCR: + MRSA./+ STAPH 

## 2022-09-29 ENCOUNTER — Encounter (HOSPITAL_COMMUNITY): Payer: Self-pay | Admitting: Orthopedic Surgery

## 2022-09-29 ENCOUNTER — Ambulatory Visit: Payer: Self-pay | Admitting: Student

## 2022-09-29 NOTE — Anesthesia Preprocedure Evaluation (Addendum)
Anesthesia Evaluation  Patient identified by MRN, date of birth, ID band Patient awake    Reviewed: Allergy & Precautions, NPO status , Patient's Chart, lab work & pertinent test results  Airway Mallampati: II  TM Distance: >3 FB Neck ROM: Full    Dental no notable dental hx. (+) Teeth Intact, Dental Advisory Given   Pulmonary asthma , sleep apnea (dos not wear as much as should) , pneumonia, resolved, COPD,  COPD inhaler, former smoker   Pulmonary exam normal breath sounds clear to auscultation       Cardiovascular hypertension, Normal cardiovascular exam Rhythm:Regular Rate:Normal  Echo 01/24/21  1. Left ventricular ejection fraction, by estimation, is 50 to 55%. The  left ventricle has low normal function. The left ventricle has no regional  wall motion abnormalities. Left ventricular diastolic parameters are  consistent with Grade I diastolic  dysfunction (impaired relaxation).   2. Right ventricular systolic function is normal. The right ventricular  size is normal. There is normal pulmonary artery systolic pressure.   3. The mitral valve is normal in structure. No evidence of mitral valve  regurgitation. No evidence of mitral stenosis.   4. The aortic valve is normal in structure. Aortic valve regurgitation is  not visualized.     Neuro/Psych MS  negative psych ROS   GI/Hepatic Neg liver ROS,GERD  Medicated and Controlled,,Elevated LFT's   Endo/Other  Hyperlipidemia  Renal/GU negative Renal ROSLab Results      Component                Value               Date                      CREATININE               0.83                09/21/2022                  K                        3.9                 09/21/2022                  negative genitourinary   Musculoskeletal  (+) Arthritis , Osteoarthritis,  OA right knee   Abdominal   Peds  Hematology  (+) anemia Lab Results      Component                Value                Date                      WBC                      7.4                 09/21/2022                HGB                      12.4 (L)            09/21/2022  HCT                      39.1                09/21/2022                      PLT                      256                 09/21/2022              Anesthesia Other Findings All: see list  Reproductive/Obstetrics                             Anesthesia Physical Anesthesia Plan  ASA: 3  Anesthesia Plan: Spinal and Regional   Post-op Pain Management: Minimal or no pain anticipated and Regional block*   Induction: Intravenous  PONV Risk Score and Plan: 2 and Treatment may vary due to age or medical condition, Ondansetron and Propofol infusion  Airway Management Planned: Natural Airway  Additional Equipment: None  Intra-op Plan:   Post-operative Plan:   Informed Consent: I have reviewed the patients History and Physical, chart, labs and discussed the procedure including the risks, benefits and alternatives for the proposed anesthesia with the patient or authorized representative who has indicated his/her understanding and acceptance.     Dental advisory given  Plan Discussed with:   Anesthesia Plan Comments: (Spinal w R adductor)        Anesthesia Quick Evaluation

## 2022-09-30 ENCOUNTER — Other Ambulatory Visit: Payer: Self-pay

## 2022-09-30 ENCOUNTER — Ambulatory Visit (HOSPITAL_COMMUNITY)
Admission: RE | Admit: 2022-09-30 | Discharge: 2022-09-30 | Disposition: A | Discharge status: Home / Self Care | Payer: No Typology Code available for payment source | Source: Ambulatory Visit | Attending: Orthopedic Surgery | Admitting: Orthopedic Surgery

## 2022-09-30 ENCOUNTER — Ambulatory Visit (HOSPITAL_COMMUNITY): Payer: No Typology Code available for payment source

## 2022-09-30 ENCOUNTER — Ambulatory Visit (HOSPITAL_COMMUNITY): Payer: No Typology Code available for payment source | Admitting: Anesthesiology

## 2022-09-30 ENCOUNTER — Encounter (HOSPITAL_COMMUNITY)
Admission: RE | Disposition: A | Discharge status: Home / Self Care | Payer: Self-pay | Source: Ambulatory Visit | Attending: Orthopedic Surgery

## 2022-09-30 ENCOUNTER — Ambulatory Visit (HOSPITAL_BASED_OUTPATIENT_CLINIC_OR_DEPARTMENT_OTHER): Payer: No Typology Code available for payment source | Admitting: Anesthesiology

## 2022-09-30 ENCOUNTER — Encounter (HOSPITAL_COMMUNITY): Payer: Self-pay | Admitting: Orthopedic Surgery

## 2022-09-30 DIAGNOSIS — R2689 Other abnormalities of gait and mobility: Secondary | ICD-10-CM | POA: Insufficient documentation

## 2022-09-30 DIAGNOSIS — K219 Gastro-esophageal reflux disease without esophagitis: Secondary | ICD-10-CM | POA: Insufficient documentation

## 2022-09-30 DIAGNOSIS — G4733 Obstructive sleep apnea (adult) (pediatric): Secondary | ICD-10-CM | POA: Diagnosis not present

## 2022-09-30 DIAGNOSIS — R7989 Other specified abnormal findings of blood chemistry: Secondary | ICD-10-CM | POA: Insufficient documentation

## 2022-09-30 DIAGNOSIS — M2550 Pain in unspecified joint: Secondary | ICD-10-CM | POA: Insufficient documentation

## 2022-09-30 DIAGNOSIS — M1711 Unilateral primary osteoarthritis, right knee: Secondary | ICD-10-CM

## 2022-09-30 DIAGNOSIS — Z96651 Presence of right artificial knee joint: Secondary | ICD-10-CM

## 2022-09-30 DIAGNOSIS — D649 Anemia, unspecified: Secondary | ICD-10-CM | POA: Insufficient documentation

## 2022-09-30 DIAGNOSIS — J449 Chronic obstructive pulmonary disease, unspecified: Secondary | ICD-10-CM

## 2022-09-30 DIAGNOSIS — G35 Multiple sclerosis: Secondary | ICD-10-CM | POA: Diagnosis not present

## 2022-09-30 DIAGNOSIS — E785 Hyperlipidemia, unspecified: Secondary | ICD-10-CM | POA: Insufficient documentation

## 2022-09-30 DIAGNOSIS — I10 Essential (primary) hypertension: Secondary | ICD-10-CM | POA: Diagnosis not present

## 2022-09-30 DIAGNOSIS — Z87891 Personal history of nicotine dependence: Secondary | ICD-10-CM

## 2022-09-30 DIAGNOSIS — I2510 Atherosclerotic heart disease of native coronary artery without angina pectoris: Secondary | ICD-10-CM

## 2022-09-30 HISTORY — PX: KNEE ARTHROPLASTY: SHX992

## 2022-09-30 SURGERY — ARTHROPLASTY, KNEE, TOTAL, USING IMAGELESS COMPUTER-ASSISTED NAVIGATION
Anesthesia: Regional | Site: Knee | Laterality: Right

## 2022-09-30 MED ORDER — FENTANYL CITRATE (PF) 100 MCG/2ML IJ SOLN
INTRAMUSCULAR | Status: DC | PRN
  Administered 2022-09-30 (×2): 50 ug via INTRAVENOUS

## 2022-09-30 MED ORDER — 0.9 % SODIUM CHLORIDE (POUR BTL) OPTIME
TOPICAL | Status: DC | PRN
  Administered 2022-09-30: 1000 mL

## 2022-09-30 MED ORDER — OXYCODONE HCL 5 MG PO TABS
5.0000 mg | ORAL_TABLET | Freq: Once | ORAL | Status: AC | PRN
  Administered 2022-09-30: 5 mg via ORAL

## 2022-09-30 MED ORDER — LACTATED RINGERS IV BOLUS
500.0000 mL | Freq: Once | INTRAVENOUS | Status: AC
  Administered 2022-09-30: 500 mL via INTRAVENOUS

## 2022-09-30 MED ORDER — LACTATED RINGERS IV SOLN: INTRAVENOUS | Status: DC

## 2022-09-30 MED ORDER — ACETAMINOPHEN 325 MG PO TABS: 325.0000 mg | ORAL_TABLET | Freq: Four times a day (QID) | ORAL | Status: DC | PRN

## 2022-09-30 MED ORDER — OXYCODONE HCL 5 MG PO TABS
ORAL_TABLET | ORAL | Status: AC
  Administered 2022-09-30: 10 mg
  Filled 2022-09-30: qty 2

## 2022-09-30 MED ORDER — ONDANSETRON HCL 4 MG PO TABS: 4.0000 mg | ORAL_TABLET | Freq: Four times a day (QID) | ORAL | Status: DC | PRN

## 2022-09-30 MED ORDER — MIDAZOLAM HCL 5 MG/5ML IJ SOLN
INTRAMUSCULAR | Status: DC | PRN
  Administered 2022-09-30 (×2): 1 mg via INTRAVENOUS

## 2022-09-30 MED ORDER — PROPOFOL 500 MG/50ML IV EMUL
INTRAVENOUS | Status: DC | PRN
  Administered 2022-09-30: 125 ug/kg/min via INTRAVENOUS

## 2022-09-30 MED ORDER — PHENOL 1.4 % MT LIQD: 1.0000 | OROMUCOSAL | Status: DC | PRN

## 2022-09-30 MED ORDER — ISOPROPYL ALCOHOL 70 % SOLN
Status: AC
  Filled 2022-09-30: qty 480

## 2022-09-30 MED ORDER — KETOROLAC TROMETHAMINE 30 MG/ML IJ SOLN
INTRAMUSCULAR | Status: AC
  Filled 2022-09-30: qty 1

## 2022-09-30 MED ORDER — ASPIRIN 81 MG PO CHEW: 81.0000 mg | CHEWABLE_TABLET | Freq: Two times a day (BID) | ORAL | 0 refills | Status: AC

## 2022-09-30 MED ORDER — PROPOFOL 10 MG/ML IV BOLUS
INTRAVENOUS | Status: DC | PRN
  Administered 2022-09-30: 30 mg via INTRAVENOUS

## 2022-09-30 MED ORDER — ORAL CARE MOUTH RINSE: 15.0000 mL | Freq: Once | OROMUCOSAL | Status: AC

## 2022-09-30 MED ORDER — METHOCARBAMOL 500 MG PO TABS: 500.0000 mg | ORAL_TABLET | Freq: Four times a day (QID) | ORAL | Status: DC | PRN

## 2022-09-30 MED ORDER — SENNA 8.6 MG PO TABS: 2.0000 | ORAL_TABLET | Freq: Every day | ORAL | 0 refills | Status: AC

## 2022-09-30 MED ORDER — SODIUM CHLORIDE (PF) 0.9 % IJ SOLN
INTRAMUSCULAR | Status: DC | PRN
  Administered 2022-09-30: 30 mL

## 2022-09-30 MED ORDER — VANCOMYCIN HCL IN DEXTROSE 1-5 GM/200ML-% IV SOLN
1000.0000 mg | INTRAVENOUS | Status: AC
  Administered 2022-09-30 (×2): 1000 mg via INTRAVENOUS
  Filled 2022-09-30: qty 200

## 2022-09-30 MED ORDER — DIPHENHYDRAMINE HCL 12.5 MG/5ML PO ELIX: 12.5000 mg | ORAL_SOLUTION | ORAL | Status: DC | PRN

## 2022-09-30 MED ORDER — DOCUSATE SODIUM 100 MG PO CAPS: 100.0000 mg | ORAL_CAPSULE | Freq: Two times a day (BID) | ORAL | 0 refills | Status: AC

## 2022-09-30 MED ORDER — CLONIDINE HCL (ANALGESIA) 100 MCG/ML EP SOLN
EPIDURAL | Status: DC | PRN
  Administered 2022-09-30: 100 ug

## 2022-09-30 MED ORDER — OXYCODONE HCL 5 MG/5ML PO SOLN: 5.0000 mg | Freq: Once | ORAL | Status: AC | PRN

## 2022-09-30 MED ORDER — KETOROLAC TROMETHAMINE 30 MG/ML IJ SOLN
INTRAMUSCULAR | Status: DC | PRN
  Administered 2022-09-30: 30 mg

## 2022-09-30 MED ORDER — HYDROMORPHONE HCL 1 MG/ML IJ SOLN: 0.2500 mg | INTRAMUSCULAR | Status: DC | PRN

## 2022-09-30 MED ORDER — POLYETHYLENE GLYCOL 3350 17 G PO PACK: 17.0000 g | PACK | Freq: Every day | ORAL | Status: DC | PRN

## 2022-09-30 MED ORDER — ONDANSETRON HCL 4 MG/2ML IJ SOLN
INTRAMUSCULAR | Status: AC
  Filled 2022-09-30: qty 2

## 2022-09-30 MED ORDER — POVIDONE-IODINE 10 % EX SWAB: 2.0000 | Freq: Once | CUTANEOUS | Status: DC

## 2022-09-30 MED ORDER — CELECOXIB 200 MG PO CAPS: 200.0000 mg | ORAL_CAPSULE | Freq: Two times a day (BID) | ORAL | Status: DC

## 2022-09-30 MED ORDER — SODIUM CHLORIDE (PF) 0.9 % IJ SOLN
INTRAMUSCULAR | Status: AC
  Filled 2022-09-30: qty 30

## 2022-09-30 MED ORDER — ONDANSETRON HCL 4 MG/2ML IJ SOLN: 4.0000 mg | Freq: Four times a day (QID) | INTRAMUSCULAR | Status: DC | PRN

## 2022-09-30 MED ORDER — LACTATED RINGERS IV BOLUS
250.0000 mL | Freq: Once | INTRAVENOUS | Status: AC
  Administered 2022-09-30: 250 mL via INTRAVENOUS

## 2022-09-30 MED ORDER — DEXAMETHASONE SODIUM PHOSPHATE 10 MG/ML IJ SOLN
INTRAMUSCULAR | Status: AC
  Filled 2022-09-30: qty 1

## 2022-09-30 MED ORDER — DOCUSATE SODIUM 100 MG PO CAPS
100.0000 mg | ORAL_CAPSULE | Freq: Two times a day (BID) | ORAL | Status: DC
  Filled 2022-09-30: qty 1

## 2022-09-30 MED ORDER — MIDAZOLAM HCL 2 MG/2ML IJ SOLN
INTRAMUSCULAR | Status: AC
  Filled 2022-09-30: qty 2

## 2022-09-30 MED ORDER — DEXAMETHASONE SODIUM PHOSPHATE 10 MG/ML IJ SOLN
INTRAMUSCULAR | Status: DC | PRN
  Administered 2022-09-30: 10 mg via INTRAVENOUS

## 2022-09-30 MED ORDER — PROPOFOL 1000 MG/100ML IV EMUL
INTRAVENOUS | Status: AC
  Filled 2022-09-30: qty 200

## 2022-09-30 MED ORDER — PROPOFOL 1000 MG/100ML IV EMUL
INTRAVENOUS | Status: AC
  Filled 2022-09-30: qty 100

## 2022-09-30 MED ORDER — CHLORHEXIDINE GLUCONATE 0.12 % MT SOLN
15.0000 mL | Freq: Once | OROMUCOSAL | Status: AC
  Administered 2022-09-30: 15 mL via OROMUCOSAL

## 2022-09-30 MED ORDER — ROPIVACAINE HCL 5 MG/ML IJ SOLN
INTRAMUSCULAR | Status: DC | PRN
  Administered 2022-09-30: 30 mL via PERINEURAL

## 2022-09-30 MED ORDER — POLYETHYLENE GLYCOL 3350 17 G PO PACK: 17.0000 g | PACK | Freq: Every day | ORAL | 0 refills | Status: AC | PRN

## 2022-09-30 MED ORDER — METHOCARBAMOL 500 MG IVPB - SIMPLE MED: 500.0000 mg | Freq: Four times a day (QID) | INTRAVENOUS | Status: DC | PRN

## 2022-09-30 MED ORDER — BUPIVACAINE IN DEXTROSE 0.75-8.25 % IT SOLN
INTRATHECAL | Status: DC | PRN
  Administered 2022-09-30: 1.8 mL via INTRATHECAL

## 2022-09-30 MED ORDER — ONDANSETRON HCL 4 MG/2ML IJ SOLN: 4.0000 mg | Freq: Once | INTRAMUSCULAR | Status: DC | PRN

## 2022-09-30 MED ORDER — VANCOMYCIN HCL IN DEXTROSE 1-5 GM/200ML-% IV SOLN: 1000.0000 mg | Freq: Two times a day (BID) | INTRAVENOUS | Status: DC

## 2022-09-30 MED ORDER — HYDROMORPHONE HCL 1 MG/ML IJ SOLN: 0.5000 mg | INTRAMUSCULAR | Status: DC | PRN

## 2022-09-30 MED ORDER — ISOPROPYL ALCOHOL 70 % SOLN
Status: DC | PRN
  Administered 2022-09-30: 1 via TOPICAL

## 2022-09-30 MED ORDER — EPINEPHRINE PF 1 MG/ML IJ SOLN
INTRAMUSCULAR | Status: AC
  Filled 2022-09-30: qty 1

## 2022-09-30 MED ORDER — CEFAZOLIN SODIUM-DEXTROSE 2-4 GM/100ML-% IV SOLN
2.0000 g | INTRAVENOUS | Status: AC
  Administered 2022-09-30: 2 g via INTRAVENOUS
  Filled 2022-09-30: qty 100

## 2022-09-30 MED ORDER — OXYCODONE HCL 5 MG PO TABS: 5.0000 mg | ORAL_TABLET | ORAL | 0 refills | Status: DC | PRN

## 2022-09-30 MED ORDER — ONDANSETRON HCL 4 MG PO TABS: 4.0000 mg | ORAL_TABLET | Freq: Three times a day (TID) | ORAL | 0 refills | Status: AC | PRN

## 2022-09-30 MED ORDER — METOCLOPRAMIDE HCL 5 MG/ML IJ SOLN: 5.0000 mg | Freq: Three times a day (TID) | INTRAMUSCULAR | Status: DC | PRN

## 2022-09-30 MED ORDER — MUPIROCIN 2 % EX OINT: 1.0000 | TOPICAL_OINTMENT | Freq: Two times a day (BID) | CUTANEOUS | 0 refills | Status: AC

## 2022-09-30 MED ORDER — SODIUM CHLORIDE 0.9 % IR SOLN
Status: DC | PRN
  Administered 2022-09-30: 3000 mL

## 2022-09-30 MED ORDER — ALUM & MAG HYDROXIDE-SIMETH 200-200-20 MG/5ML PO SUSP: 30.0000 mL | ORAL | Status: DC | PRN

## 2022-09-30 MED ORDER — OXYCODONE HCL 5 MG PO TABS: 5.0000 mg | ORAL_TABLET | ORAL | Status: DC | PRN

## 2022-09-30 MED ORDER — SENNA 8.6 MG PO TABS
1.0000 | ORAL_TABLET | Freq: Two times a day (BID) | ORAL | Status: DC
  Filled 2022-09-30: qty 1

## 2022-09-30 MED ORDER — METOCLOPRAMIDE HCL 5 MG PO TABS: 5.0000 mg | ORAL_TABLET | Freq: Three times a day (TID) | ORAL | Status: DC | PRN

## 2022-09-30 MED ORDER — ONDANSETRON HCL 4 MG/2ML IJ SOLN
INTRAMUSCULAR | Status: DC | PRN
  Administered 2022-09-30: 4 mg via INTRAVENOUS

## 2022-09-30 MED ORDER — ACETAMINOPHEN 500 MG PO TABS
1000.0000 mg | ORAL_TABLET | Freq: Once | ORAL | Status: AC
  Administered 2022-09-30: 1000 mg via ORAL
  Filled 2022-09-30: qty 2

## 2022-09-30 MED ORDER — FENTANYL CITRATE (PF) 100 MCG/2ML IJ SOLN
INTRAMUSCULAR | Status: AC
  Filled 2022-09-30: qty 2

## 2022-09-30 MED ORDER — MENTHOL 3 MG MT LOZG: 1.0000 | LOZENGE | OROMUCOSAL | Status: DC | PRN

## 2022-09-30 MED ORDER — BUPIVACAINE-EPINEPHRINE 0.25% -1:200000 IJ SOLN
INTRAMUSCULAR | Status: DC | PRN
  Administered 2022-09-30: 30 mL

## 2022-09-30 MED ORDER — TRANEXAMIC ACID-NACL 1000-0.7 MG/100ML-% IV SOLN
1000.0000 mg | INTRAVENOUS | Status: AC
  Administered 2022-09-30: 1000 mg via INTRAVENOUS
  Filled 2022-09-30: qty 100

## 2022-09-30 MED ORDER — ACETAMINOPHEN 10 MG/ML IV SOLN: 1000.0000 mg | Freq: Once | INTRAVENOUS | Status: DC | PRN

## 2022-09-30 MED ORDER — OXYCODONE HCL 5 MG PO TABS: 10.0000 mg | ORAL_TABLET | ORAL | Status: DC | PRN

## 2022-09-30 MED ORDER — AMISULPRIDE (ANTIEMETIC) 5 MG/2ML IV SOLN: 10.0000 mg | Freq: Once | INTRAVENOUS | Status: DC | PRN

## 2022-09-30 MED ORDER — BUPIVACAINE HCL (PF) 0.25 % IJ SOLN
INTRAMUSCULAR | Status: AC
  Filled 2022-09-30: qty 30

## 2022-09-30 MED ORDER — PRONTOSAN WOUND IRRIGATION OPTIME
TOPICAL | Status: DC | PRN
  Administered 2022-09-30: 1 via TOPICAL

## 2022-09-30 MED ORDER — OXYCODONE HCL 5 MG PO TABS
ORAL_TABLET | ORAL | Status: AC
  Filled 2022-09-30: qty 1

## 2022-09-30 SURGICAL SUPPLY — 75 items
ADH SKN CLS APL DERMABOND .7 (GAUZE/BANDAGES/DRESSINGS) ×2
APL PRP STRL LF DISP 70% ISPRP (MISCELLANEOUS) ×2
BAG COUNTER SPONGE SURGICOUNT (BAG) IMPLANT
BAG SPEC THK2 15X12 ZIP CLS (MISCELLANEOUS) ×1
BAG SPNG CNTER NS LX DISP (BAG) ×1
BAG ZIPLOCK 12X15 (MISCELLANEOUS) IMPLANT
BATTERY INSTRU NAVIGATION (MISCELLANEOUS) ×3 IMPLANT
BLADE SAW RECIPROCATING 77.5 (BLADE) ×1 IMPLANT
BNDG CMPR 5X62 HK CLSR LF (GAUZE/BANDAGES/DRESSINGS) ×1
BNDG CMPR MED 15X6 ELC VLCR LF (GAUZE/BANDAGES/DRESSINGS) ×1
BNDG ELASTIC 4X5.8 VLCR STR LF (GAUZE/BANDAGES/DRESSINGS) ×1 IMPLANT
BNDG ELASTIC 6INX 5YD STR LF (GAUZE/BANDAGES/DRESSINGS) ×1 IMPLANT
BNDG ELASTIC 6X15 VLCR STRL LF (GAUZE/BANDAGES/DRESSINGS) IMPLANT
BTRY SRG DRVR LF (MISCELLANEOUS) ×3
CHLORAPREP W/TINT 26 (MISCELLANEOUS) ×2 IMPLANT
COMP FEM PS KNEE STD 10 RT (Joint) ×1 IMPLANT
COMP TIB KNEE PS G 0 RT (Joint) ×1 IMPLANT
COMPONENT FEM PS KN STD 10 RT (Joint) IMPLANT
COMPONENT TIB KNEE PS G 0 RT (Joint) IMPLANT
COVER SURGICAL LIGHT HANDLE (MISCELLANEOUS) ×1 IMPLANT
DERMABOND ADVANCED .7 DNX12 (GAUZE/BANDAGES/DRESSINGS) ×2 IMPLANT
DRAPE SHEET LG 3/4 BI-LAMINATE (DRAPES) ×3 IMPLANT
DRAPE U-SHAPE 47X51 STRL (DRAPES) ×1 IMPLANT
DRSG AQUACEL AG ADV 3.5X10 (GAUZE/BANDAGES/DRESSINGS) ×1 IMPLANT
ELECT BLADE TIP CTD 4 INCH (ELECTRODE) ×1 IMPLANT
ELECT REM PT RETURN 15FT ADLT (MISCELLANEOUS) ×1 IMPLANT
FILTER STRAW (MISCELLANEOUS) IMPLANT
GAUZE SPONGE 4X4 12PLY STRL (GAUZE/BANDAGES/DRESSINGS) ×1 IMPLANT
GLOVE BIO SURGEON STRL SZ7 (GLOVE) ×1 IMPLANT
GLOVE BIO SURGEON STRL SZ8.5 (GLOVE) ×2 IMPLANT
GLOVE BIOGEL PI IND STRL 7.5 (GLOVE) ×1 IMPLANT
GLOVE BIOGEL PI IND STRL 8.5 (GLOVE) ×1 IMPLANT
GOWN SPEC L3 XXLG W/TWL (GOWN DISPOSABLE) ×1 IMPLANT
GOWN STRL REUS W/ TWL XL LVL3 (GOWN DISPOSABLE) ×1 IMPLANT
GOWN STRL REUS W/TWL XL LVL3 (GOWN DISPOSABLE) ×1
HANDPIECE INTERPULSE COAX TIP (DISPOSABLE) ×1
HDLS TROCR DRIL PIN KNEE 75 (PIN) ×1
HOLDER FOLEY CATH W/STRAP (MISCELLANEOUS) ×1 IMPLANT
HOOD PEEL AWAY T7 (MISCELLANEOUS) ×3 IMPLANT
INSERT TIB ASF PS 7-12 10 GH (Insert) IMPLANT
KIT TURNOVER KIT A (KITS) IMPLANT
MARKER SKIN DUAL TIP RULER LAB (MISCELLANEOUS) ×1 IMPLANT
NDL SAFETY ECLIP 18X1.5 (MISCELLANEOUS) ×1 IMPLANT
NDL SPNL 18GX3.5 QUINCKE PK (NEEDLE) ×1 IMPLANT
NEEDLE SPNL 18GX3.5 QUINCKE PK (NEEDLE) ×1 IMPLANT
NS IRRIG 1000ML POUR BTL (IV SOLUTION) ×1 IMPLANT
PACK TOTAL KNEE CUSTOM (KITS) ×1 IMPLANT
PADDING CAST ABS COTTON 6X4 NS (CAST SUPPLIES) IMPLANT
PADDING CAST COTTON 6X4 STRL (CAST SUPPLIES) ×1 IMPLANT
PATELLA STD SZ 38X10 (Miscellaneous) IMPLANT
PIN DRILL HDLS TROCAR 75 4PK (PIN) IMPLANT
PROTECTOR NERVE ULNAR (MISCELLANEOUS) ×1 IMPLANT
SAW OSC TIP CART 19.5X105X1.3 (SAW) ×1 IMPLANT
SCREW FEMALE HEX FIX 25X2.5 (ORTHOPEDIC DISPOSABLE SUPPLIES) IMPLANT
SEALER BIPOLAR AQUA 6.0 (INSTRUMENTS) ×1 IMPLANT
SET HNDPC FAN SPRY TIP SCT (DISPOSABLE) ×1 IMPLANT
SET PAD KNEE POSITIONER (MISCELLANEOUS) ×1 IMPLANT
SOLUTION PRONTOSAN WOUND 350ML (IRRIGATION / IRRIGATOR) IMPLANT
SPIKE FLUID TRANSFER (MISCELLANEOUS) ×2 IMPLANT
SUT MNCRL AB 3-0 PS2 18 (SUTURE) ×1 IMPLANT
SUT MNCRL AB 4-0 PS2 18 (SUTURE) IMPLANT
SUT MON AB 2-0 CT1 36 (SUTURE) ×1 IMPLANT
SUT STRATAFIX PDO 1 14 VIOLET (SUTURE) ×1
SUT STRATFX PDO 1 14 VIOLET (SUTURE) ×1
SUT VIC AB 1 CTX 36 (SUTURE) ×2
SUT VIC AB 1 CTX36XBRD ANBCTR (SUTURE) ×2 IMPLANT
SUT VIC AB 2-0 CT1 27 (SUTURE) ×1
SUT VIC AB 2-0 CT1 TAPERPNT 27 (SUTURE) ×1 IMPLANT
SUTURE STRATFX PDO 1 14 VIOLET (SUTURE) ×1 IMPLANT
SYR 27GX1/2 1ML LL SAFETY (SYRINGE) IMPLANT
SYR 3ML LL SCALE MARK (SYRINGE) ×1 IMPLANT
TRAY FOLEY MTR SLVR 16FR STAT (SET/KITS/TRAYS/PACK) IMPLANT
TUBE SUCTION HIGH CAP CLEAR NV (SUCTIONS) ×1 IMPLANT
WATER STERILE IRR 1000ML POUR (IV SOLUTION) ×2 IMPLANT
WRAP KNEE MAXI GEL POST OP (GAUZE/BANDAGES/DRESSINGS) IMPLANT

## 2022-09-30 NOTE — Transfer of Care (Signed)
Immediate Anesthesia Transfer of Care Note  Patient: Randy Greene  Procedure(s) Performed: COMPUTER ASSISTED TOTAL KNEE ARTHROPLASTY (Right: Knee)  Patient Location: PACU  Anesthesia Type:Spinal  Level of Consciousness: awake, alert , and oriented  Airway & Oxygen Therapy: Patient Spontanous Breathing  Post-op Assessment: Report given to RN and Post -op Vital signs reviewed and stable  Post vital signs: Reviewed and stable  Last Vitals:  Vitals Value Taken Time  BP 114/72 09/30/22 1131  Temp    Pulse 68 09/30/22 1132  Resp 11 09/30/22 1132  SpO2 92 % 09/30/22 1132  Vitals shown include unvalidated device data.  Last Pain:  Vitals:   09/30/22 0727  TempSrc: Oral         Complications: No notable events documented.

## 2022-09-30 NOTE — Anesthesia Procedure Notes (Signed)
Spinal  Patient location during procedure: OR Start time: 09/30/2022 8:37 AM End time: 09/30/2022 8:39 AM Reason for block: surgical anesthesia Staffing Performed: resident/CRNA  Resident/CRNA: [place], Kylani Wires C, CRNA Performed by: [place], Herny Scurlock C, CRNA Authorized by: Trevor Iha, MD   Preanesthetic Checklist Completed: patient identified, IV checked, site marked, risks and benefits discussed, surgical consent, monitors and equipment checked, pre-op evaluation and timeout performed Spinal Block Patient position: sitting Prep: DuraPrep and site prepped and draped Patient monitoring: heart rate, cardiac monitor, continuous pulse ox and blood pressure Approach: midline Location: L3-4 Injection technique: single-shot Needle Needle type: Pencan  Needle gauge: 24 G Needle length: 9 cm Assessment Sensory level: T4 Events: CSF return Additional Notes IV functioning, monitors applied to pt. Expiration date of kit checked and confirmed to be in date. Sterile prep and drape, hand hygiene and sterile gloved used. Pt was positioned and spine was prepped in sterile fashion. Skin was anesthetized with lidocaine. Free flow of clear CSF obtained prior to injecting local anesthetic into CSF x 1 attempt. Spinal needle aspirated freely following injection. Needle was carefully withdrawn, and pt tolerated procedure well. Loss of motor and sensory on exam post injection.

## 2022-09-30 NOTE — Op Note (Signed)
OPERATIVE REPORT  SURGEON: Samson Frederic, MD   ASSISTANT: Clint Bolder, PA-C  PREOPERATIVE DIAGNOSIS: Primary Right knee arthritis.   POSTOPERATIVE DIAGNOSIS: Primary Right knee arthritis.   PROCEDURE: Computer assisted Right total knee arthroplasty.   IMPLANTS: Zimmer Persona PPS Cementless CR femur, size 10. Persona 0 degree Spiked Keel OsseoTi Tibia, size G. Vivacit-E polyethelyene insert, size 10 mm, CR. TM standard patella, size 38 mm.  ANESTHESIA:  MAC, Regional, and Spinal  TOURNIQUET TIME: Not utilized.   ESTIMATED BLOOD LOSS:-200 mL    ANTIBIOTICS: 1 g Vancomycin (h/o MRSA). 2 g Ancef.  DRAINS: None.  COMPLICATIONS: None   CONDITION: PACU - hemodynamically stable.   BRIEF CLINICAL NOTE: Randy Greene is a 57 y.o. male with a long-standing history of Right knee arthritis. After failing conservative management, the patient was indicated for total knee arthroplasty. The risks, benefits, and alternatives to the procedure were explained, and the patient elected to proceed.  PROCEDURE IN DETAIL: Adductor canal block was obtained in the pre-op holding area. Once inside the operative room, spinal anesthesia was obtained, and a foley catheter was inserted. The patient was then positioned and the lower extremity was prepped and draped in the normal sterile surgical fashion.  A time-out was called verifying side and site of surgery. The patient received IV antibiotics within 60 minutes of beginning the procedure. A tourniquet was not utilized.   An anterior approach to the knee was performed utilizing a midvastus arthrotomy. A medial release was performed and the patellar fat pad was excised. Stryker imageless navigation was used to cut the distal femur perpendicular to the mechanical axis. A freehand patellar resection was performed, and the patella was sized an prepared with 3 lug holes.  Nagivation was used to make a neutral proximal tibia resection, taking 9 mm of bone from the  less affected lateral side with 3 degrees of slope. The menisci were excised. A spacer block was placed, and the alignment and balance in extension were confirmed.   The distal femur was sized using the 3-degree external rotation guide referencing the posterior femoral cortex. The appropriate 4-in-1 cutting block was pinned into place. Rotation was checked using Whiteside's line, the epicondylar axis, and then confirmed with a spacer block in flexion. The remaining femoral cuts were performed, taking care to protect the MCL.  The tibia was sized and the trial tray was pinned into place. The remaining trail components were inserted. The knee was stable to varus and valgus stress through a full range of motion. The patella tracked centrally, and the PCL was well balanced. The trial components were removed, and the proximal tibial surface was prepared. Final components were impacted into place. The knee was tested for a final time and found to be well balanced.   The wound was copiously irrigated with Prontosan solution and normal saline using pule lavage.  Marcaine solution was injected into the periarticular soft tissue.  The wound was closed in layers using #1 Vicryl and Stratafix for the fascia, 2-0 Vicryl for the subcutaneous fat, 2-0 Monocryl for the deep dermal layer, 3-0 running Monocryl subcuticular Stitch, and 4-0 Monocryl stay sutures at both ends of the wound. Dermabond was applied to the skin.  Once the glue was fully dried, an Aquacell Ag and compressive dressing were applied.  The patient was transported to the recovery room in stable condition.  Sponge, needle, and instrument counts were correct at the end of the case x2.  The patient tolerated the procedure well and  there were no known complications.  The aquamantis was utilized for this case to help facilitate better hemostasis as patient was felt to be at increased risk of bleeding because of complex case requiring increased OR time and/or  exposure (no tourniquet use during surgery).  A oscillating saw tip was utilized for this case to prevent damage to the soft tissue structures such as muscles, ligaments and tendons, and to ensure accurate bone cuts. This patient was at increased risk for above structures due to minimally invasive approach.  Please note that a surgical assistant was a medical necessity for this procedure in order to perform it in a safe and expeditious manner. Surgical assistant was necessary to retract the ligaments and vital neurovascular structures to prevent injury to them and also necessary for proper positioning of the limb to allow for anatomic placement of the prosthesis.

## 2022-09-30 NOTE — Interval H&P Note (Signed)
History and Physical Interval Note:  09/30/2022 8:11 AM  Randy Greene  has presented today for surgery, with the diagnosis of Right knee osteoarthritis.  The various methods of treatment have been discussed with the patient and family. After consideration of risks, benefits and other options for treatment, the patient has consented to  Procedure(s) with comments: COMPUTER ASSISTED TOTAL KNEE ARTHROPLASTY (Right) - 160 as a surgical intervention.  The patient's history has been reviewed, patient examined, no change in status, stable for surgery.  I have reviewed the patient's chart and labs.  Questions were answered to the patient's satisfaction.     Iline Oven Asta Corbridge

## 2022-09-30 NOTE — Anesthesia Postprocedure Evaluation (Signed)
Anesthesia Post Note  Patient: Randy Greene  Procedure(s) Performed: COMPUTER ASSISTED TOTAL KNEE ARTHROPLASTY (Right: Knee)     Patient location during evaluation: Nursing Unit Anesthesia Type: Regional and Spinal Level of consciousness: oriented and awake and alert Pain management: pain level controlled Vital Signs Assessment: post-procedure vital signs reviewed and stable Respiratory status: spontaneous breathing and respiratory function stable Cardiovascular status: blood pressure returned to baseline and stable Postop Assessment: no headache, no backache, no apparent nausea or vomiting and patient able to bend at knees Anesthetic complications: no  No notable events documented.  Last Vitals:  Vitals:   09/30/22 1445 09/30/22 1600  BP: 134/80 134/82  Pulse:    Resp: 20 18  Temp: 36.6 C 36.6 C  SpO2: 96% 96%    Last Pain:  Vitals:   09/30/22 1600  TempSrc:   PainSc: 0-No pain                 Trevor Iha

## 2022-09-30 NOTE — Anesthesia Procedure Notes (Signed)
Anesthesia Regional Block: Adductor canal block   Pre-Anesthetic Checklist: , timeout performed,  Correct Patient, Correct Site, Correct Laterality,  Correct Procedure, Correct Position, site marked,  Risks and benefits discussed,  Surgical consent,  Pre-op evaluation,  At surgeon's request and post-op pain management  Laterality: Lower and Right  Prep: chloraprep       Needles:  Injection technique: Single-shot  Needle Type: Echogenic Needle     Needle Length: 9cm  Needle Gauge: 22     Additional Needles:   Procedures:,,,, ultrasound used (permanent image in chart),,    Narrative:  Start time: 09/30/2022 7:53 AM End time: 09/30/2022 7:58 AM Injection made incrementally with aspirations every 5 mL.  Performed by: Personally  Anesthesiologist: Trevor Iha, MD  Additional Notes: Block assessed prior to surgery. Pt tolerated procedure well.

## 2022-09-30 NOTE — Discharge Instructions (Signed)
 Dr. Brian Swinteck Total Joint Specialist [facility] [address] [place], [place] [address] [phone]  TOTAL KNEE REPLACEMENT POSTOPERATIVE DIRECTIONS    Knee Rehabilitation, Guidelines Following Surgery  Results after knee surgery are often greatly improved when you follow the exercise, range of motion and muscle strengthening exercises prescribed by your doctor. Safety measures are also important to protect the knee from further injury. Any time any of these exercises cause you to have increased pain or swelling in your knee joint, decrease the amount until you are comfortable again and slowly increase them. If you have problems or questions, call your caregiver or physical therapist for advice.   WEIGHT BEARING Weight bearing as tolerated with assist device (walker, cane, etc) as directed, use it as long as suggested by your surgeon or therapist, typically at least 4-6 weeks.  HOME CARE INSTRUCTIONS  Remove items at home which could result in a fall. This includes throw rugs or furniture in walking pathways.  Continue medications as instructed at time of discharge. You may have some home medications which will be placed on hold until you complete the course of blood thinner medication.  You may start showering once you are discharged home but do not submerge the incision under water. Just pat the incision dry and apply a dry gauze dressing on daily. Walk with walker as instructed.  You may resume a sexual relationship in one month or when given the OK by your doctor.  Use walker as long as suggested by your caregivers. Avoid periods of inactivity such as sitting longer than an hour when not asleep. This helps prevent blood clots.  You may put full weight on your legs and walk as much as is comfortable.  You may return to work once you are cleared by your doctor.  Do not drive a car for 6 weeks or until released by you surgeon.  Do not drive while  taking narcotics.  Wear the elastic stockings for three weeks following surgery during the day but you may remove then at night. Make sure you keep all of your appointments after your operation with all of your doctors and caregivers. You should call the office at the above phone number and make an appointment for approximately two weeks after the date of your surgery. Do not remove your surgical dressing. The dressing is waterproof; you may take showers in 3 days, but do not take tub baths or submerge the dressing. Please pick up a stool softener and laxative for home use as long as you are requiring pain medications. ICE to the affected knee every three hours for 30 minutes at a time and then as needed for pain and swelling.  Continue to use ice on the knee for pain and swelling from surgery. You may notice swelling that will progress down to the foot and ankle.  This is normal after surgery.  Elevate the leg when you are not up walking on it.   It is important for you to complete the blood thinner medication as prescribed by your doctor. Continue to use the breathing machine which will help keep your temperature down.  It is common for your temperature to cycle up and down following surgery, especially at night when you are not up moving around and exerting yourself.  The breathing machine keeps your lungs expanded and your temperature down.  RANGE OF MOTION AND STRENGTHENING EXERCISES  Rehabilitation of the knee is important following a knee injury or an   operation. After just a few days of immobilization, the muscles of the thigh which control the knee become weakened and shrink (atrophy). Knee exercises are designed to build up the tone and strength of the thigh muscles and to improve knee motion. Often times heat used for twenty to thirty minutes before working out will loosen up your tissues and help with improving the range of motion but do not use heat for the first two weeks following surgery.  These exercises can be done on a training (exercise) mat, on the floor, on a table or on a bed. Use what ever works the best and is most comfortable for you Knee exercises include:  Leg Lifts - While your knee is still immobilized in a splint or cast, you can do straight leg raises. Lift the leg to 60 degrees, hold for 3 sec, and slowly lower the leg. Repeat 10-20 times 2-3 times daily. Perform this exercise against resistance later as your knee gets better.  Quad and Hamstring Sets - Tighten up the muscle on the front of the thigh (Quad) and hold for 5-10 sec. Repeat this 10-20 times hourly. Hamstring sets are done by pushing the foot backward against an object and holding for 5-10 sec. Repeat as with quad sets.  A rehabilitation program following serious knee injuries can speed recovery and prevent re-injury in the future due to weakened muscles. Contact your doctor or a physical therapist for more information on knee rehabilitation.   POST-OPERATIVE OPIOID TAPER INSTRUCTIONS: It is important to wean off of your opioid medication as soon as possible. If you do not need pain medication after your surgery it is ok to stop day one. Opioids include: Codeine, Hydrocodone(Norco, Vicodin), Oxycodone(Percocet, oxycontin) and hydromorphone amongst others.  Long term and even short term use of opiods can cause: Increased pain response Dependence Constipation Depression Respiratory depression And more.  Withdrawal symptoms can include Flu like symptoms Nausea, vomiting And more Techniques to manage these symptoms Hydrate well Eat regular healthy meals Stay active Use relaxation techniques(deep breathing, meditating, yoga) Do Not substitute Alcohol to help with tapering If you have been on opioids for less than two weeks and do not have pain than it is ok to stop all together.  Plan to wean off of opioids This plan should start within one week post op of your joint replacement. Maintain the same  interval or time between taking each dose and first decrease the dose.  Cut the total daily intake of opioids by one tablet each day Next start to increase the time between doses. The last dose that should be eliminated is the evening dose.    SKILLED REHAB INSTRUCTIONS: If the patient is transferred to a skilled rehab facility following release from the hospital, a list of the current medications will be sent to the facility for the patient to continue.  When discharged from the skilled rehab facility, please have the facility set up the patient's Home Health Physical Therapy prior to being released. Also, the skilled facility will be responsible for providing the patient with their medications at time of release from the facility to include their pain medication, the muscle relaxants, and their blood thinner medication. If the patient is still at the rehab facility at time of the two week follow up appointment, the skilled rehab facility will also need to assist the patient in arranging follow up appointment in our office and any transportation needs.  MAKE SURE YOU:  Understand these instructions.  Will watch   your condition.  Will get help right away if you are not doing well or get worse.    Pick up stool softner and laxative for home use following surgery while on pain medications. Do NOT remove your dressing. You may shower.  Do not take tub baths or submerge incision under water. May shower starting three days after surgery. Please use a clean towel to pat the incision dry following showers. Continue to use ice for pain and swelling after surgery. Do not use any lotions or creams on the incision until instructed by your surgeon.  

## 2022-09-30 NOTE — Evaluation (Signed)
Physical Therapy Evaluation Patient Details Name: Randy Greene MRN: [id number] DOB: 10/29/1965 Today's Date: 09/30/2022  History of Present Illness  57 yo male presents to therapy s/p R TKA on 09/30/2022 due to failure of conservative measures. Pt PMH includes but is not limited to: COPD, respiratory failure, PNA/aspiration bronchitis, OSA on CPAP, MS and L TKA (2018).  Clinical Impression      Randy Greene is a 57 y.o. male POD 0 s/p R TKA. Patient reports mod I with mobility at baseline. Patient is now limited by functional impairments (see PT problem list below) and requires min guard and cues for transfers and gait with RW. Patient was able to ambulate 80 feet with RW and min guard and progressing to S and cues for safe walker management. Patient educated on safe car transfers and pain management and verbalized safe guarding position for people assisting with mobility. Patient instructed in exercises to facilitate ROM and circulation reviewed with HO provided. Patient will benefit from continued skilled PT interventions to address impairments and progress towards PLOF. Patient has met mobility goals at adequate level for discharge home with family support and pt reporting starting OPPT on 4/12; will continue to follow if pt continues acute stay to progress towards Mod I goals.    Recommendations for follow up therapy are one component of a multi-disciplinary discharge planning process, led by the attending physician.  Recommendations may be updated based on patient status, additional functional criteria and insurance authorization.  Follow Up Recommendations       Assistance Recommended at Discharge Intermittent Supervision/Assistance  Patient can return home with the following  A little help with walking and/or transfers;A little help with bathing/dressing/bathroom;Assistance with cooking/housework;Assist for transportation;Help with stairs or ramp for entrance    Equipment Recommendations  None recommended by PT (pt reports having DME in home setting)  Recommendations for Other Services       Functional Status Assessment Patient has had a recent decline in their functional status and demonstrates the ability to make significant improvements in function in a reasonable and predictable amount of time.     Precautions / Restrictions Precautions Precautions: Fall;Knee Restrictions Weight Bearing Restrictions: No      Mobility  Bed Mobility Overal bed mobility: Needs Assistance Bed Mobility: Supine to Sit     Supine to sit: Supervision          Transfers Overall transfer level: Needs assistance Equipment used: Rolling walker (2 wheels) Transfers: Sit to/from Stand Sit to Stand: Min guard           General transfer comment: cues for proper UE placement    Ambulation/Gait Ambulation/Gait assistance: Supervision (min guard and progressing to S on level surfaces) Gait Distance (Feet): 80 Feet Assistive device: Rolling walker (2 wheels) Gait Pattern/deviations: Decreased stance time - right, Antalgic Gait velocity: slight decrease        Stairs            Wheelchair Mobility    Modified Rankin (Stroke Patients Only)       Balance Overall balance assessment: Needs assistance Sitting-balance support: Bilateral upper extremity supported Sitting balance-Leahy Scale: Good     Standing balance support: Bilateral upper extremity supported, During functional activity, Reliant on assistive device for balance Standing balance-Leahy Scale: Poor                               Pertinent Vitals/Pain Pain Assessment Pain Assessment: 0-10 Pain  Score: 4  Pain Location: R knee Pain Descriptors / Indicators: Aching, Discomfort, Operative site guarding Pain Intervention(s): Limited activity within patient's tolerance, Monitored during session, Premedicated before session, Repositioned, Ice applied    Home Living Family/patient expects to  be discharged to:: Private residence Living Arrangements: Alone Available Help at Discharge: Family (sister until Monday then friends will assist as needed) Type of Home: House Home Access: Elevator       Home Layout: One level Home Equipment: Agricultural consultant (2 wheels);Rollator (4 wheels);Shower seat - built in;Grab bars - tub/shower;Grab bars - toilet Additional Comments: iceman manchine    Prior Function Prior Level of Function : Independent/Modified Independent             Mobility Comments: mod I with rollator or SPC mod I for all ADLs, self care tasks, driving       Hand Dominance        Extremity/Trunk Assessment        Lower Extremity Assessment Lower Extremity Assessment: RLE deficits/detail RLE Deficits / Details: ankle DF/PF 5/5; SLR < 10 degree lag RLE Sensation: decreased proprioception (MS)    Cervical / Trunk Assessment Cervical / Trunk Assessment: Normal  Communication   Communication: No difficulties  Cognition Arousal/Alertness: Awake/alert Behavior During Therapy: WFL for tasks assessed/performed Overall Cognitive Status: Within Functional Limits for tasks assessed                                          General Comments      Exercises Total Joint Exercises Ankle Circles/Pumps: AROM, Both, Supine Quad Sets: AROM, Right, 5 reps Heel Slides: AROM, Right, 5 reps Hip ABduction/ADduction: AROM, Right, 5 reps Straight Leg Raises: AROM, Right, 5 reps Long Arc Quad: AROM, Right, 5 reps   Assessment/Plan    PT Assessment Patient needs continued PT services  PT Problem List Decreased strength;Decreased range of motion;Decreased activity tolerance;Decreased balance;Decreased mobility;Decreased coordination;Pain       PT Treatment Interventions DME instruction;Gait training;Functional mobility training;Therapeutic activities;Therapeutic exercise;Balance training;Neuromuscular re-education;Patient/family education;Modalities     PT Goals (Current goals can be found in the Care Plan section)  Acute Rehab PT Goals Patient Stated Goal: to be able to hang out with the kids, be more active and do yard work PT Goal Formulation: With patient Time For Goal Achievement: 10/14/22 Potential to Achieve Goals: Good    Frequency       Co-evaluation               AM-PAC PT "6 Clicks" Mobility  Outcome Measure Help needed turning from your back to your side while in a flat bed without using bedrails?: None Help needed moving from lying on your back to sitting on the side of a flat bed without using bedrails?: A Little Help needed moving to and from a bed to a chair (including a wheelchair)?: A Little Help needed standing up from a chair using your arms (e.g., wheelchair or bedside chair)?: A Little Help needed to walk in hospital room?: A Little Help needed climbing 3-5 steps with a railing? : A Lot 6 Click Score: 18    End of Session Equipment Utilized During Treatment: Gait belt Activity Tolerance: Patient tolerated treatment well;No increased pain Patient left: in chair;with call bell/phone within reach;with family/visitor present Nurse Communication: Mobility status;Other (comment) (progression toward d/c) PT Visit Diagnosis: Unsteadiness on feet (R26.81);Other abnormalities of gait and  mobility (R26.89);Muscle weakness (generalized) (M62.81);Pain Pain - Right/Left: Right Pain - part of body: Knee    Time: 1400-1441 PT Time Calculation (min) (ACUTE ONLY): 41 min   Charges:   PT Evaluation $PT Eval Low Complexity: 1 Low PT Treatments $Gait Training: 8-22 mins $Therapeutic Exercise: 8-22 mins        Rica Mote, PT   Jacqualyn Posey 09/30/2022, 2:54 PM

## 2022-10-01 ENCOUNTER — Encounter (HOSPITAL_COMMUNITY): Payer: Self-pay | Admitting: Orthopedic Surgery

## 2023-01-08 ENCOUNTER — Encounter: Payer: Self-pay | Admitting: Physical Medicine & Rehabilitation

## 2023-01-08 ENCOUNTER — Encounter: Payer: Medicare PPO | Attending: Physical Medicine & Rehabilitation | Admitting: Physical Medicine & Rehabilitation

## 2023-01-08 VITALS — BP 136/88 | HR 80 | Ht 74.0 in | Wt 205.0 lb

## 2023-01-08 DIAGNOSIS — M47817 Spondylosis without myelopathy or radiculopathy, lumbosacral region: Secondary | ICD-10-CM

## 2023-01-08 DIAGNOSIS — R269 Unspecified abnormalities of gait and mobility: Secondary | ICD-10-CM | POA: Diagnosis not present

## 2023-01-08 DIAGNOSIS — M545 Low back pain, unspecified: Secondary | ICD-10-CM | POA: Insufficient documentation

## 2023-01-08 NOTE — Progress Notes (Signed)
Subjective:    Patient ID: Randy Greene, male    DOB: [birth year], 57 y.o.   MRN: [id number]  HPI 57 year old male with history of lumbar spondylosis.  He has had positive response to bilateral L3-L4 medial branch and L5 dorsal ramus radiofrequency neurotomy with greater than 50% relief for at least 6 months.  His last procedure was performed on the right side in January 2024.  The patient had a left L3-L4 medial branch and L5 dorsal ramus radiofrequency neurotomy performed November, 2023 and is still effective RIght Low back pain increasing again over the last several weeks.  Interval hx is + for Right knee replacement 3 months ago.   Pain Inventory Average Pain 7 Pain Right Now 6 My pain is constant, sharp, burning, dull, stabbing, and tingling  In the last 24 hours, has pain interfered with the following? General activity 8 Relation with others 7 Enjoyment of life 8 What TIME of day is your pain at its worst? evening Sleep (in general) Fair  Pain is worse with: walking, bending, sitting, inactivity, standing, and some activites Pain improves with: medication, injections, and ice Relief from Meds: 5  Family History  Problem Relation Age of Onset   Dementia Mother    Hypertension Father    Heart attack Father    Social History   Socioeconomic History   Marital status: Single    Spouse name: Not on file   Number of children: Not on file   Years of education: Not on file   Highest education level: Not on file  Occupational History   Not on file  Tobacco Use   Smoking status: Former    Current packs/day: 0.00    Average packs/day: 1 pack/day for 20.0 years (20.0 ttl pk-yrs)    Types: Cigarettes    Start date: 11/02/1986    Quit date: 11/02/2006    Years since quitting: 16.1   Smokeless tobacco: Never  Vaping Use   Vaping status: Never Used  Substance and Sexual Activity   Alcohol use: Yes    Comment: ocas.   Drug use: No    Comment: medical marijuana   Sexual  activity: Never  Other Topics Concern   Not on file  Social History Narrative   Not on file   Social Determinants of Health   Financial Resource Strain: Not on file  Food Insecurity: Not on file  Transportation Needs: Not on file  Physical Activity: Not on file  Stress: Not on file  Social Connections: Not on file   Past Surgical History:  Procedure Laterality Date   ARTHROSCOPIC Left    BRONCHIAL WASHINGS  01/14/2021   Procedure: BRONCHIAL WASHINGS;  Surgeon: Lorin Glass, MD;  Location: [facility] ENDOSCOPY;  Service: Pulmonary;;   HERNIA REPAIR     INCISION AND DRAINAGE Left 01/04/2017   Procedure: INCISION AND DRAINAGE LEFT KNEE;  Surgeon: Samson Frederic, MD;  Location: [facility] OR;  Service: Orthopedics;  Laterality: Left;   jawsurgery     KNEE ARTHROPLASTY Left 08/10/2016   Procedure: LEFT TOTAL KNEE ARTHROPLASTY WITH COMPUTER NAVIGATION;  Surgeon: Samson Frederic, MD;  Location: [facility] OR;  Service: Orthopedics;  Laterality: Left;   KNEE ARTHROPLASTY Right 09/30/2022   Procedure: COMPUTER ASSISTED TOTAL KNEE ARTHROPLASTY;  Surgeon: Samson Frederic, MD;  Location: [facility] ORS;  Service: Orthopedics;  Laterality: Right;  160   knees scope Right    TONSILLECTOMY AND ADENOIDECTOMY     VIDEO BRONCHOSCOPY Bilateral 01/14/2021   Procedure: VIDEO BRONCHOSCOPY WITHOUT  FLUORO;  Surgeon: Lorin Glass, MD;  Location: [facility] ENDOSCOPY;  Service: Pulmonary;  Laterality: Bilateral;   Past Surgical History:  Procedure Laterality Date   ARTHROSCOPIC Left    BRONCHIAL WASHINGS  01/14/2021   Procedure: BRONCHIAL WASHINGS;  Surgeon: Lorin Glass, MD;  Location: [facility] ENDOSCOPY;  Service: Pulmonary;;   HERNIA REPAIR     INCISION AND DRAINAGE Left 01/04/2017   Procedure: INCISION AND DRAINAGE LEFT KNEE;  Surgeon: Samson Frederic, MD;  Location: [facility] OR;  Service: Orthopedics;  Laterality: Left;   jawsurgery     KNEE ARTHROPLASTY Left 08/10/2016   Procedure: LEFT TOTAL KNEE ARTHROPLASTY WITH COMPUTER NAVIGATION;   Surgeon: Samson Frederic, MD;  Location: [facility] OR;  Service: Orthopedics;  Laterality: Left;   KNEE ARTHROPLASTY Right 09/30/2022   Procedure: COMPUTER ASSISTED TOTAL KNEE ARTHROPLASTY;  Surgeon: Samson Frederic, MD;  Location: [facility] ORS;  Service: Orthopedics;  Laterality: Right;  160   knees scope Right    TONSILLECTOMY AND ADENOIDECTOMY     VIDEO BRONCHOSCOPY Bilateral 01/14/2021   Procedure: VIDEO BRONCHOSCOPY WITHOUT FLUORO;  Surgeon: Lorin Glass, MD;  Location: [facility] ENDOSCOPY;  Service: Pulmonary;  Laterality: Bilateral;   Past Medical History:  Diagnosis Date   Acid reflux    Arthritis    Aspiration pneumonia (HCC)    Asthma    COPD (chronic obstructive pulmonary disease) (HCC)    Delayed wound healing    left knee   Dyspnea    Enlarged prostate    Hypercapnic respiratory failure, chronic (HCC)    Hypertension    MS (multiple sclerosis) (HCC)    BP 136/88   Pulse 80   Ht 6\' 2"  (1.88 m)   Wt 205 lb (93 kg)   SpO2 97%   BMI 26.32 kg/m   Opioid Risk Score:   Fall Risk Score:  `1  Depression screen PHQ 2/9     01/08/2023    2:11 PM 01/08/2023    2:07 PM 05/21/2022   10:26 AM 03/31/2022    1:54 PM 12/09/2021    1:22 PM 09/04/2021    9:51 AM 06/06/2021   10:43 AM  Depression screen PHQ 2/9  Decreased Interest 0 0 0 0 0 0 0  Down, Depressed, Hopeless 0 0 0 0 0 0 0  PHQ - 2 Score 0 0 0 0 0 0 0    Review of Systems  Musculoskeletal:  Positive for back pain and gait problem.       Pain in both legs  All other systems reviewed and are negative.     Objective:   Physical Exam  There is mild tenderness palpation around the right L4 paraspinal area there is pain with extension in that same area as well as lateral bending to that side. Negative straight leg raising bilaterally No pain with hip or knee range of motion although right knee is stiff compared to left side. Ambulates with a cane although he is able to stand without a cane      Assessment & Plan:   1.   Lumbar spondylosis without myelopathy he does have chronic pain in that area and has had prolonged results greater than 50% relief with lumbar radial frequency neurotomy.  He has had recent knee replacement and due to gait abnormalities following surgery he is likely exacerbated some of the pain on the right side.  Will schedule in 1 month for repeat right L3-L4 medial branch and right L5 dorsal ramus radiofrequency neurotomy under fluoroscopic guidance.

## 2023-01-12 ENCOUNTER — Ambulatory Visit: Payer: Medicare (Managed Care) | Admitting: Physical Medicine & Rehabilitation

## 2023-02-19 ENCOUNTER — Encounter: Payer: Medicare PPO | Admitting: Physical Medicine & Rehabilitation

## 2023-03-09 ENCOUNTER — Inpatient Hospital Stay (HOSPITAL_COMMUNITY): Payer: No Typology Code available for payment source

## 2023-03-09 ENCOUNTER — Other Ambulatory Visit: Payer: Self-pay

## 2023-03-09 ENCOUNTER — Encounter: Payer: Medicare PPO | Admitting: Physical Medicine & Rehabilitation

## 2023-03-09 ENCOUNTER — Encounter (HOSPITAL_COMMUNITY): Payer: Self-pay | Admitting: Emergency Medicine

## 2023-03-09 ENCOUNTER — Emergency Department (HOSPITAL_COMMUNITY): Payer: No Typology Code available for payment source

## 2023-03-09 ENCOUNTER — Inpatient Hospital Stay (HOSPITAL_COMMUNITY)
Admission: EM | Admit: 2023-03-09 | Discharge: 2023-03-12 | DRG: 193 | Disposition: A | Discharge status: Home / Self Care | Payer: No Typology Code available for payment source | Attending: Internal Medicine | Admitting: Internal Medicine

## 2023-03-09 DIAGNOSIS — Z91014 Allergy to mammalian meats: Secondary | ICD-10-CM

## 2023-03-09 DIAGNOSIS — J8489 Other specified interstitial pulmonary diseases: Secondary | ICD-10-CM | POA: Diagnosis present

## 2023-03-09 DIAGNOSIS — I2510 Atherosclerotic heart disease of native coronary artery without angina pectoris: Secondary | ICD-10-CM | POA: Diagnosis present

## 2023-03-09 DIAGNOSIS — N400 Enlarged prostate without lower urinary tract symptoms: Secondary | ICD-10-CM | POA: Diagnosis present

## 2023-03-09 DIAGNOSIS — J440 Chronic obstructive pulmonary disease with acute lower respiratory infection: Secondary | ICD-10-CM | POA: Diagnosis present

## 2023-03-09 DIAGNOSIS — Z880 Allergy status to penicillin: Secondary | ICD-10-CM | POA: Diagnosis not present

## 2023-03-09 DIAGNOSIS — R6510 Systemic inflammatory response syndrome (SIRS) of non-infectious origin without acute organ dysfunction: Secondary | ICD-10-CM

## 2023-03-09 DIAGNOSIS — I10 Essential (primary) hypertension: Secondary | ICD-10-CM | POA: Diagnosis present

## 2023-03-09 DIAGNOSIS — E871 Hypo-osmolality and hyponatremia: Secondary | ICD-10-CM | POA: Diagnosis present

## 2023-03-09 DIAGNOSIS — J9601 Acute respiratory failure with hypoxia: Secondary | ICD-10-CM | POA: Diagnosis present

## 2023-03-09 DIAGNOSIS — Z79899 Other long term (current) drug therapy: Secondary | ICD-10-CM | POA: Diagnosis not present

## 2023-03-09 DIAGNOSIS — Z8249 Family history of ischemic heart disease and other diseases of the circulatory system: Secondary | ICD-10-CM

## 2023-03-09 DIAGNOSIS — Z820 Family history of epilepsy and other diseases of the nervous system: Secondary | ICD-10-CM

## 2023-03-09 DIAGNOSIS — J9621 Acute and chronic respiratory failure with hypoxia: Secondary | ICD-10-CM | POA: Diagnosis present

## 2023-03-09 DIAGNOSIS — Z791 Long term (current) use of non-steroidal anti-inflammatories (NSAID): Secondary | ICD-10-CM

## 2023-03-09 DIAGNOSIS — J189 Pneumonia, unspecified organism: Principal | ICD-10-CM | POA: Diagnosis present

## 2023-03-09 DIAGNOSIS — G500 Trigeminal neuralgia: Secondary | ICD-10-CM | POA: Diagnosis present

## 2023-03-09 DIAGNOSIS — Z8616 Personal history of COVID-19: Secondary | ICD-10-CM

## 2023-03-09 DIAGNOSIS — G4733 Obstructive sleep apnea (adult) (pediatric): Secondary | ICD-10-CM | POA: Diagnosis present

## 2023-03-09 DIAGNOSIS — G35 Multiple sclerosis: Secondary | ICD-10-CM | POA: Diagnosis present

## 2023-03-09 DIAGNOSIS — Z87891 Personal history of nicotine dependence: Secondary | ICD-10-CM | POA: Diagnosis not present

## 2023-03-09 DIAGNOSIS — Z888 Allergy status to other drugs, medicaments and biological substances status: Secondary | ICD-10-CM | POA: Diagnosis not present

## 2023-03-09 DIAGNOSIS — K219 Gastro-esophageal reflux disease without esophagitis: Secondary | ICD-10-CM | POA: Diagnosis present

## 2023-03-09 DIAGNOSIS — A419 Sepsis, unspecified organism: Principal | ICD-10-CM

## 2023-03-09 DIAGNOSIS — Z91048 Other nonmedicinal substance allergy status: Secondary | ICD-10-CM | POA: Diagnosis not present

## 2023-03-09 DIAGNOSIS — Z91018 Allergy to other foods: Secondary | ICD-10-CM

## 2023-03-09 DIAGNOSIS — Z885 Allergy status to narcotic agent status: Secondary | ICD-10-CM

## 2023-03-09 DIAGNOSIS — J449 Chronic obstructive pulmonary disease, unspecified: Secondary | ICD-10-CM | POA: Diagnosis present

## 2023-03-09 DIAGNOSIS — Z7982 Long term (current) use of aspirin: Secondary | ICD-10-CM | POA: Diagnosis not present

## 2023-03-09 DIAGNOSIS — Z96653 Presence of artificial knee joint, bilateral: Secondary | ICD-10-CM | POA: Diagnosis present

## 2023-03-09 DIAGNOSIS — Z9102 Food additives allergy status: Secondary | ICD-10-CM

## 2023-03-09 DIAGNOSIS — Z7951 Long term (current) use of inhaled steroids: Secondary | ICD-10-CM

## 2023-03-09 DIAGNOSIS — Z79620 Long term (current) use of immunosuppressive biologic: Secondary | ICD-10-CM

## 2023-03-09 DIAGNOSIS — J42 Unspecified chronic bronchitis: Secondary | ICD-10-CM | POA: Diagnosis not present

## 2023-03-09 LAB — RESP PANEL BY RT-PCR (RSV, FLU A&B, COVID)  RVPGX2
Influenza A by PCR: NEGATIVE
Influenza B by PCR: NEGATIVE
Resp Syncytial Virus by PCR: NEGATIVE
SARS Coronavirus 2 by RT PCR: POSITIVE — AB

## 2023-03-09 LAB — RESPIRATORY PANEL BY PCR

## 2023-03-09 LAB — I-STAT CG4 LACTIC ACID, ED
Lactic Acid, Venous: 1.4 mmol/L (ref 0.5–1.9)
Lactic Acid, Venous: 1.7 mmol/L (ref 0.5–1.9)

## 2023-03-09 LAB — CBC WITH DIFFERENTIAL/PLATELET
Abs Immature Granulocytes: 0.03 10*3/uL (ref 0.00–0.07)
Basophils Absolute: 0 10*3/uL (ref 0.0–0.1)
Basophils Relative: 0 %
Eosinophils Absolute: 0 10*3/uL (ref 0.0–0.5)
Eosinophils Relative: 0 %
HCT: 36.6 % — ABNORMAL LOW (ref 39.0–52.0)
Hemoglobin: 12.2 g/dL — ABNORMAL LOW (ref 13.0–17.0)
Immature Granulocytes: 1 %
Lymphocytes Relative: 7 %
Lymphs Abs: 0.3 10*3/uL — ABNORMAL LOW (ref 0.7–4.0)
MCH: 28.2 pg (ref 26.0–34.0)
MCHC: 33.3 g/dL (ref 30.0–36.0)
MCV: 84.7 fL (ref 80.0–100.0)
Monocytes Absolute: 0.3 10*3/uL (ref 0.1–1.0)
Monocytes Relative: 6 %
Neutro Abs: 3.5 10*3/uL (ref 1.7–7.7)
Neutrophils Relative %: 86 %
Platelets: 322 10*3/uL (ref 150–400)
RBC: 4.32 MIL/uL (ref 4.22–5.81)
RDW: 15.1 % (ref 11.5–15.5)
WBC: 4.1 10*3/uL (ref 4.0–10.5)
nRBC: 0 % (ref 0.0–0.2)

## 2023-03-09 LAB — APTT: aPTT: 34 s (ref 24–36)

## 2023-03-09 LAB — COMPREHENSIVE METABOLIC PANEL
ALT: 21 U/L (ref 0–44)
AST: 32 U/L (ref 15–41)
Albumin: 2.9 g/dL — ABNORMAL LOW (ref 3.5–5.0)
Alkaline Phosphatase: 73 U/L (ref 38–126)
Anion gap: 13 (ref 5–15)
BUN: 10 mg/dL (ref 6–20)
CO2: 23 mmol/L (ref 22–32)
Calcium: 8.7 mg/dL — ABNORMAL LOW (ref 8.9–10.3)
Chloride: 95 mmol/L — ABNORMAL LOW (ref 98–111)
Creatinine, Ser: 0.94 mg/dL (ref 0.61–1.24)
GFR, Estimated: >60 mL/min (ref >60)
Glucose, Bld: 121 mg/dL — ABNORMAL HIGH (ref 70–99)
Potassium: 3.7 mmol/L (ref 3.5–5.1)
Sodium: 131 mmol/L — ABNORMAL LOW (ref 135–145)
Total Bilirubin: 0.4 mg/dL (ref 0.3–1.2)
Total Protein: 6.7 g/dL (ref 6.5–8.1)

## 2023-03-09 LAB — URINALYSIS, W/ REFLEX TO CULTURE (INFECTION SUSPECTED)
Bacteria, UA: NONE SEEN
Bilirubin Urine: NEGATIVE
Glucose, UA: NEGATIVE mg/dL
Hgb urine dipstick: NEGATIVE
Ketones, ur: NEGATIVE mg/dL
Leukocytes,Ua: NEGATIVE
Nitrite: NEGATIVE
Protein, ur: NEGATIVE mg/dL
Specific Gravity, Urine: 1.014 (ref 1.005–1.030)
pH: 5 (ref 5.0–8.0)

## 2023-03-09 LAB — HIV ANTIBODY (ROUTINE TESTING W REFLEX): HIV Screen 4th Generation wRfx: NONREACTIVE

## 2023-03-09 LAB — TROPONIN I (HIGH SENSITIVITY): Troponin I (High Sensitivity): 9 ng/L (ref <18)

## 2023-03-09 LAB — PROTIME-INR
INR: 1.2 (ref 0.8–1.2)
Prothrombin Time: 15.3 s — ABNORMAL HIGH (ref 11.4–15.2)

## 2023-03-09 LAB — STREP PNEUMONIAE URINARY ANTIGEN: Strep Pneumo Urinary Antigen: NEGATIVE

## 2023-03-09 MED ORDER — IPRATROPIUM-ALBUTEROL 0.5-2.5 (3) MG/3ML IN SOLN
3.0000 mL | Freq: Four times a day (QID) | RESPIRATORY_TRACT | Status: DC
  Administered 2023-03-09: 3 mL via RESPIRATORY_TRACT
  Filled 2023-03-09: qty 3

## 2023-03-09 MED ORDER — ENOXAPARIN SODIUM 40 MG/0.4ML IJ SOSY
40.0000 mg | PREFILLED_SYRINGE | INTRAMUSCULAR | Status: DC
  Administered 2023-03-09 – 2023-03-11 (×3): 40 mg via SUBCUTANEOUS
  Filled 2023-03-09 (×3): qty 0.4

## 2023-03-09 MED ORDER — ACETAMINOPHEN-CODEINE 300-30 MG PO TABS: 1.0000 | ORAL_TABLET | Freq: Two times a day (BID) | ORAL | Status: DC | PRN

## 2023-03-09 MED ORDER — PREGABALIN 100 MG PO CAPS
300.0000 mg | ORAL_CAPSULE | Freq: Every day | ORAL | Status: DC
  Administered 2023-03-09 – 2023-03-11 (×3): 300 mg via ORAL
  Filled 2023-03-09 (×3): qty 3

## 2023-03-09 MED ORDER — LACTATED RINGERS IV BOLUS (SEPSIS)
1000.0000 mL | Freq: Once | INTRAVENOUS | Status: AC
  Administered 2023-03-09: 1000 mL via INTRAVENOUS

## 2023-03-09 MED ORDER — BUDESONIDE 0.25 MG/2ML IN SUSP
0.2500 mg | Freq: Two times a day (BID) | RESPIRATORY_TRACT | Status: DC
  Administered 2023-03-09 – 2023-03-12 (×7): 0.25 mg via RESPIRATORY_TRACT
  Filled 2023-03-09 (×8): qty 2

## 2023-03-09 MED ORDER — SODIUM CHLORIDE 0.9 % IV SOLN
500.0000 mg | Freq: Every day | INTRAVENOUS | Status: DC
  Administered 2023-03-09 – 2023-03-11 (×3): 500 mg via INTRAVENOUS
  Filled 2023-03-09 (×3): qty 5

## 2023-03-09 MED ORDER — ONDANSETRON HCL 4 MG PO TABS: 4.0000 mg | ORAL_TABLET | Freq: Four times a day (QID) | ORAL | Status: DC | PRN

## 2023-03-09 MED ORDER — VANCOMYCIN HCL 2000 MG/400ML IV SOLN
2000.0000 mg | Freq: Once | INTRAVENOUS | Status: AC
  Administered 2023-03-09: 2000 mg via INTRAVENOUS
  Filled 2023-03-09: qty 400

## 2023-03-09 MED ORDER — POLYVINYL ALCOHOL 1.4 % OP SOLN: 1.0000 [drp] | OPHTHALMIC | Status: DC | PRN

## 2023-03-09 MED ORDER — FLUTICASONE PROPIONATE 50 MCG/ACT NA SUSP
1.0000 | Freq: Every day | NASAL | Status: DC
  Administered 2023-03-10 – 2023-03-12 (×3): 1 via NASAL
  Filled 2023-03-09: qty 16

## 2023-03-09 MED ORDER — TRAMADOL HCL 50 MG PO TABS
50.0000 mg | ORAL_TABLET | Freq: Every day | ORAL | Status: DC | PRN
  Administered 2023-03-11: 50 mg via ORAL
  Filled 2023-03-09: qty 1

## 2023-03-09 MED ORDER — ACETAMINOPHEN 325 MG PO TABS
650.0000 mg | ORAL_TABLET | Freq: Four times a day (QID) | ORAL | Status: DC | PRN
  Administered 2023-03-09 – 2023-03-12 (×3): 650 mg via ORAL
  Filled 2023-03-09 (×3): qty 2

## 2023-03-09 MED ORDER — MONTELUKAST SODIUM 10 MG PO TABS
10.0000 mg | ORAL_TABLET | Freq: Every day | ORAL | Status: DC
  Administered 2023-03-09 – 2023-03-11 (×3): 10 mg via ORAL
  Filled 2023-03-09 (×3): qty 1

## 2023-03-09 MED ORDER — IPRATROPIUM-ALBUTEROL 0.5-2.5 (3) MG/3ML IN SOLN
3.0000 mL | Freq: Four times a day (QID) | RESPIRATORY_TRACT | Status: AC
  Administered 2023-03-09 (×2): 3 mL via RESPIRATORY_TRACT
  Filled 2023-03-09 (×2): qty 3

## 2023-03-09 MED ORDER — DRONABINOL 2.5 MG PO CAPS
5.0000 mg | ORAL_CAPSULE | Freq: Three times a day (TID) | ORAL | Status: DC
  Administered 2023-03-09 – 2023-03-12 (×9): 5 mg via ORAL
  Filled 2023-03-09 (×9): qty 2

## 2023-03-09 MED ORDER — PREGABALIN 25 MG PO CAPS: 150.0000 mg | ORAL_CAPSULE | ORAL | Status: DC

## 2023-03-09 MED ORDER — PREGABALIN 75 MG PO CAPS
150.0000 mg | ORAL_CAPSULE | Freq: Two times a day (BID) | ORAL | Status: DC
  Administered 2023-03-09 – 2023-03-12 (×7): 150 mg via ORAL
  Filled 2023-03-09 (×7): qty 2

## 2023-03-09 MED ORDER — BACLOFEN 10 MG PO TABS
20.0000 mg | ORAL_TABLET | Freq: Four times a day (QID) | ORAL | Status: DC
  Administered 2023-03-09 – 2023-03-12 (×11): 20 mg via ORAL
  Filled 2023-03-09 (×11): qty 2

## 2023-03-09 MED ORDER — DICLOFENAC SODIUM 1 % EX GEL: 2.0000 g | Freq: Two times a day (BID) | CUTANEOUS | Status: DC | PRN

## 2023-03-09 MED ORDER — SODIUM CHLORIDE 0.9 % IV SOLN
2.0000 g | Freq: Every day | INTRAVENOUS | Status: DC
  Administered 2023-03-09 – 2023-03-12 (×4): 2 g via INTRAVENOUS
  Filled 2023-03-09 (×4): qty 20

## 2023-03-09 MED ORDER — SODIUM CHLORIDE 0.9 % IV SOLN
2.0000 g | Freq: Once | INTRAVENOUS | Status: AC
  Administered 2023-03-09: 2 g via INTRAVENOUS
  Filled 2023-03-09: qty 12.5

## 2023-03-09 MED ORDER — SENNOSIDES-DOCUSATE SODIUM 8.6-50 MG PO TABS: 1.0000 | ORAL_TABLET | Freq: Every evening | ORAL | Status: DC | PRN

## 2023-03-09 MED ORDER — LOPERAMIDE HCL 2 MG PO CAPS
2.0000 mg | ORAL_CAPSULE | ORAL | Status: DC | PRN
  Administered 2023-03-09 – 2023-03-11 (×5): 2 mg via ORAL
  Filled 2023-03-09 (×5): qty 1

## 2023-03-09 MED ORDER — FINASTERIDE 5 MG PO TABS
5.0000 mg | ORAL_TABLET | Freq: Every morning | ORAL | Status: DC
  Administered 2023-03-10 – 2023-03-12 (×3): 5 mg via ORAL
  Filled 2023-03-09 (×3): qty 1

## 2023-03-09 MED ORDER — ALBUTEROL SULFATE (2.5 MG/3ML) 0.083% IN NEBU: 2.5000 mg | INHALATION_SOLUTION | RESPIRATORY_TRACT | Status: DC | PRN

## 2023-03-09 MED ORDER — MAGNESIUM OXIDE -MG SUPPLEMENT 400 (240 MG) MG PO TABS
400.0000 mg | ORAL_TABLET | Freq: Two times a day (BID) | ORAL | Status: DC
  Administered 2023-03-09 – 2023-03-12 (×7): 400 mg via ORAL
  Filled 2023-03-09 (×7): qty 1

## 2023-03-09 MED ORDER — PHENYTOIN SODIUM EXTENDED 100 MG PO CAPS
100.0000 mg | ORAL_CAPSULE | Freq: Two times a day (BID) | ORAL | Status: DC
  Administered 2023-03-09 – 2023-03-12 (×7): 100 mg via ORAL
  Filled 2023-03-09 (×9): qty 1

## 2023-03-09 MED ORDER — FAMOTIDINE 20 MG PO TABS
40.0000 mg | ORAL_TABLET | Freq: Every day | ORAL | Status: DC
  Administered 2023-03-09 – 2023-03-11 (×3): 40 mg via ORAL
  Filled 2023-03-09 (×3): qty 2

## 2023-03-09 MED ORDER — ONDANSETRON HCL 4 MG/2ML IJ SOLN: 4.0000 mg | Freq: Four times a day (QID) | INTRAMUSCULAR | Status: DC | PRN

## 2023-03-09 MED ORDER — RISAQUAD PO CAPS: 1.0000 | ORAL_CAPSULE | Freq: Every morning | ORAL | Status: DC

## 2023-03-09 MED ORDER — VANCOMYCIN HCL IN DEXTROSE 1-5 GM/200ML-% IV SOLN: 1000.0000 mg | Freq: Once | INTRAVENOUS | Status: DC

## 2023-03-09 MED ORDER — RISAQUAD PO CAPS
1.0000 | ORAL_CAPSULE | Freq: Two times a day (BID) | ORAL | Status: DC
  Administered 2023-03-09 – 2023-03-12 (×7): 1 via ORAL
  Filled 2023-03-09 (×8): qty 1

## 2023-03-09 MED ORDER — ASPIRIN 81 MG PO TBEC
81.0000 mg | DELAYED_RELEASE_TABLET | Freq: Every day | ORAL | Status: DC
  Administered 2023-03-09 – 2023-03-11 (×3): 81 mg via ORAL
  Filled 2023-03-09 (×3): qty 1

## 2023-03-09 MED ORDER — METOCLOPRAMIDE HCL 10 MG PO TABS
10.0000 mg | ORAL_TABLET | Freq: Every evening | ORAL | Status: DC
  Administered 2023-03-09 – 2023-03-11 (×3): 10 mg via ORAL
  Filled 2023-03-09 (×3): qty 1

## 2023-03-09 MED ORDER — LACTATED RINGERS IV SOLN: INTRAVENOUS | Status: AC

## 2023-03-09 MED ORDER — IOHEXOL 350 MG/ML SOLN
75.0000 mL | Freq: Once | INTRAVENOUS | Status: AC | PRN
  Administered 2023-03-09: 75 mL via INTRAVENOUS

## 2023-03-09 MED ORDER — METRONIDAZOLE 500 MG/100ML IV SOLN
500.0000 mg | Freq: Once | INTRAVENOUS | Status: AC
  Administered 2023-03-09: 500 mg via INTRAVENOUS
  Filled 2023-03-09: qty 100

## 2023-03-09 MED ORDER — ALFUZOSIN HCL ER 10 MG PO TB24
10.0000 mg | ORAL_TABLET | Freq: Every evening | ORAL | Status: DC
  Administered 2023-03-09 – 2023-03-11 (×3): 10 mg via ORAL
  Filled 2023-03-09 (×4): qty 1

## 2023-03-09 NOTE — Assessment & Plan Note (Signed)
Has not seen pulm in years and unaware of this diagnosis On pulmicort BID F/u on CTA chest Possibly pulm consult if pneumonia ruled out vs. Follow up outpatient

## 2023-03-09 NOTE — Assessment & Plan Note (Addendum)
Per chart review history of recurrent pneumonia Diagnosed with covid 9/2 with resolution of symptoms  CXR with ILD vs. Super imposed acute multilobar pneumonia Report fever and SIRS criteria with LLL crackles Received vancomycin and cefepime in ED, change to rocephin and azithromycin.  Completed 4 days of doxycycline outpatient  Urine antigens pending BC pending Check RVP and PCT  Sputum culture  Scheduled duonebs/pulmicort. SABA prn  Flutter valve/IS to bedside  Continue gentle IVF

## 2023-03-09 NOTE — ED Triage Notes (Addendum)
Presents via EMS from home for CP, SOB, N/V, productive cough, night sweat.   H/o COPD and MS. Diagnosed with Covid 2 weeks ago. CP and SOB since that time. Has also continued to be febrile (101F).  EMS exam: rhochi L lung, 160/82, 127bpm, 20RR, 81% RA which improved to 94% on 4 L. 101F. A&Ox4.   Trialed on RA in triage exam room, dropped to 89%, returned to [place] at 2L which improved spO2 to 94%.  He is not on supplemental oxygen at baseline

## 2023-03-09 NOTE — Assessment & Plan Note (Signed)
He was on oxcarbazepine, but he developed severe hyponatrenmia.  Continue his phenytoin. baclofen. pregabilin

## 2023-03-09 NOTE — Progress Notes (Signed)
ED Pharmacy Antibiotic Sign Off An antibiotic consult was received from an ED provider for cefepime and vancomycin per pharmacy dosing for sepsis. A chart review was completed to assess appropriateness.   The following one time order(s) were placed:   -Cefepime 2g IV x1 per MD - Vancomycin 2000mg  IV x1  Further antibiotic and/or antibiotic pharmacy consults should be ordered by the admitting provider if indicated.   Thank you for allowing pharmacy to be a part of this patient's care.   Arabella Merles, [facility]  Clinical Pharmacist 03/09/23 5:05 AM

## 2023-03-09 NOTE — Assessment & Plan Note (Signed)
Continue cpap at night  

## 2023-03-09 NOTE — H&P (Signed)
History and Physical    Patient: Randy Greene WUJ:[id number] DOB: [birth year] DOA: 03/09/2023 DOS: the patient was seen and examined on 03/09/2023 PCP: [facility]  Patient coming from: Home - lives alone. Uses walker, cane and scooter.    Chief Complaint: cough and shortness of breath  HPI: Randy Greene is a 57 y.o. male with medical history significant of COPD, HTN, MS, GERD, nonspecific interstitial pneumonitis, recurrent aspiration bronchitis/pneumonia, OSA on cpap, trigeminal neuralgia, CAD who presented to ED with cough and just not feeling well. He has fatigue worse than normal and decreased PO intake. He had Covid about 2+ weeks ago and diagnosed on 02/22/23. He had symptoms a few days before diagnosis. He was treated with a course of Molnupivir. He felt like he got better from his Covid and then started to have new symptoms one week ago.   He states about one week ago he started to have chills and would soak his sheets. He went to the [place] last Friday and he was told if not better by Monday to go to ED. He had a CXR at the [place] and was told he had "stuff in the lower lobes" and was started on doxycycline. He has had four complete days of the doxycyline. He states he has had fevers to 101-104 at home. He has had mild production with his cough. He does feel short of breath.  He has been around sick contacts. He has had some diarrhea but attributes this to the doxycycline as it started after he took this. He has been taking mucinex, tylenol and [facility] with a steroid in it that he has been using twice a day.    Denies any vision changes, +headaches, chest pain or palpitations, abdominal pain, N/V, dysuria or leg swelling.    He does not smoke or drink alcohol.   ER Course:  vitals: temp: 99.1, bp: 137/79, HR: 122, RR: 24, oxygen: 97%RA Pertinent labs: sodium: 131, lactic acid: 1.4,  CXR: abnormal with findings related to chronic interstitial lung disease based on comparison  with prior chest CT; however, the possibility of super imposed acute multilobular bronchopneumonia should be considered. Repeat chest CT would provide additional diagnostic information if appropriate.  In ED: code sepsis activated. Given 1L IVF bolus and started on broad spectrum abx. Lactic acid wnl, BC obtained. TRH asked to admit.    Review of Systems: As mentioned in the history of present illness. All other systems reviewed and are negative. Past Medical History:  Diagnosis Date   Acid reflux    Arthritis    Aspiration pneumonia (HCC)    Asthma    COPD (chronic obstructive pulmonary disease) (HCC)    Delayed wound healing    left knee   Dyspnea    Enlarged prostate    Hypercapnic respiratory failure, chronic (HCC)    Hypertension    MS (multiple sclerosis) (HCC)    Past Surgical History:  Procedure Laterality Date   ARTHROSCOPIC Left    BRONCHIAL WASHINGS  01/14/2021   Procedure: BRONCHIAL WASHINGS;  Surgeon: Lorin Glass, MD;  Location: [facility] ENDOSCOPY;  Service: Pulmonary;;   HERNIA REPAIR     INCISION AND DRAINAGE Left 01/04/2017   Procedure: INCISION AND DRAINAGE LEFT KNEE;  Surgeon: Samson Frederic, MD;  Location: [facility] OR;  Service: Orthopedics;  Laterality: Left;   jawsurgery     KNEE ARTHROPLASTY Left 08/10/2016   Procedure: LEFT TOTAL KNEE ARTHROPLASTY WITH COMPUTER NAVIGATION;  Surgeon: Samson Frederic,  MD;  Location: [facility] OR;  Service: Orthopedics;  Laterality: Left;   KNEE ARTHROPLASTY Right 09/30/2022   Procedure: COMPUTER ASSISTED TOTAL KNEE ARTHROPLASTY;  Surgeon: Samson Frederic, MD;  Location: [facility] ORS;  Service: Orthopedics;  Laterality: Right;  160   knees scope Right    TONSILLECTOMY AND ADENOIDECTOMY     VIDEO BRONCHOSCOPY Bilateral 01/14/2021   Procedure: VIDEO BRONCHOSCOPY WITHOUT FLUORO;  Surgeon: Lorin Glass, MD;  Location: [facility] ENDOSCOPY;  Service: Pulmonary;  Laterality: Bilateral;   Social History:  reports that he quit smoking about 16 years ago. His  smoking use included cigarettes. He started smoking about 36 years ago. He has a 20 pack-year smoking history. He has never used smokeless tobacco. He reports current alcohol use. He reports that he does not use drugs.  Allergies  Allergen Reactions   Penicillins Hives, Other (See Comments) and Rash    "My face got flush"   Tape Rash   Zoloft [Sertraline] Rash   Food Nausea And Vomiting and Other (See Comments)    GREEN PEPPERS- flushed  PT DOES NOT EAT MEAT   Morphine Itching   Mushroom Extract Complex Nausea And Vomiting and Other (See Comments)    And flushed Other reaction(s): Other (See Comments) And flushed   Penicillin G Rash   Pravastatin Other (See Comments)    Family History  Problem Relation Age of Onset   Dementia Mother    Hypertension Father    Heart attack Father     Prior to Admission medications   Medication Sig Start Date End Date Taking? Authorizing Provider  acetaminophen-codeine (TYLENOL #3) 300-30 MG tablet Take 1 tablet by mouth 2 (two) times daily as needed for moderate pain. 05/06/22  Yes [provider]  albuterol (PROVENTIL HFA;VENTOLIN HFA) 108 (90 BASE) MCG/ACT inhaler Inhale 2 puffs into the lungs every 6 (six) hours as needed for wheezing or shortness of breath.   Yes [provider]  alfuzosin (UROXATRAL) 10 MG 24 hr tablet Take 10 mg by mouth every evening.   Yes [provider]  amphetamine-dextroamphetamine (ADDERALL XR) 30 MG 24 hr capsule Take 30 mg by mouth 2 (two) times daily. 09/30/21  Yes [provider]  aspirin EC 81 MG tablet Take by mouth. 10/12/22  Yes [provider]  B Complex Vitamins (VITAMIN B COMPLEX W/B-12) TABS VITAMIN B COMPLEX W/B-12 TAB                                      Non-[facility]                                                                      TAKE ONE TABLET BY MOUTH DAILY  Sep 04, 2008                                        [facility]  Documented by:  ST MARTIN,CHARLES D                                    Documented at:  [facility] 09/04/08  Yes [provider]  baclofen (LIORESAL) 20 MG tablet Take 20 mg by mouth 4 (four) times daily. 09/23/22  Yes [provider]  calcium carbonate (OS-CAL) 1250 (500 Ca) MG chewable tablet Chew 1 tablet by mouth 2 (two) times daily. 07/24/21  Yes [provider]  carboxymethylcellulose 1 % ophthalmic solution Place 1 drop into both eyes as needed (dry eyes).   Yes [provider]  Cholecalciferol (VITAMIN D) 50 MCG (2000 UT) tablet Take 6,000 Units by mouth daily.   Yes [provider]  cyanocobalamin (,VITAMIN B-12,) 1000 MCG/ML injection Inject 1,000 mcg into the muscle every 28 (twenty-eight) days. 02/04/21  Yes [provider]  cyclobenzaprine (FLEXERIL) 10 MG tablet Take 10 mg by mouth daily as needed (severe muscle spasms). 06/28/22  Yes [provider]  diclofenac Sodium (VOLTAREN) 1 % GEL Apply 2 g topically 2 (two) times daily as needed (pain). 11/27/19  Yes [provider]  dronabinol (MARINOL) 2.5 MG capsule Take 5 mg by mouth 3 (three) times daily.   Yes [provider]  esomeprazole (NEXIUM) 40 MG capsule Take 40 mg by mouth 2 (two) times daily before a meal.   Yes [provider]  famotidine (PEPCID) 40 MG tablet Take 40 mg by mouth at bedtime.   Yes [provider]  finasteride (PROSCAR) 5 MG tablet Take 5 mg by mouth in the morning.   Yes [provider]  fluorouracil (EFUDEX) 5 % cream Apply 1 application  topically daily as needed (brown spots on forehead).   Yes [provider]  fluticasone (FLONASE) 50 MCG/ACT nasal spray Place 1 spray into both nostrils daily.   Yes [provider]  ketotifen (ZADITOR) 0.025 % ophthalmic solution Place 1 drop into both eyes 2 (two) times daily as needed  (itching).   Yes [provider]  LACTOBACILLUS PO Take 1 capsule by mouth every morning. 06/29/22  Yes [provider]  magnesium (MAGTAB) 84 MG ( ) TBCR SR tablet Take 42 mg by mouth 2 (two) times daily. 07/24/21  Yes [provider]  meloxicam (MOBIC) 15 MG tablet Take 15 mg by mouth daily. For knee pain 01/05/23  Yes [provider]  metoCLOPramide (REGLAN) 10 MG tablet Take 10 mg by mouth every evening. 07/24/21  Yes [provider]  Mometasone Furoate 100 MCG/ACT AERO Take 1 spray by mouth every morning. 10/23/22  Yes [provider]  montelukast (SINGULAIR) 10 MG tablet Take 10 mg by mouth at bedtime.   Yes [provider]  Ofatumumab 20 MG/0.4ML SOAJ Inject 20 mg into the skin every 30 (thirty) days. Kesimpta 11/20/22  Yes [provider]  Omega-3 Fatty Acids (FISH OIL) 1000 MG CAPS Take 1,000 mg by mouth in the morning, at noon, and at bedtime.   Yes [provider]  ondansetron (ZOFRAN) 4 MG tablet Take 1 tablet (4 mg total) by mouth every 8 (eight) hours as needed for nausea or vomiting. 09/30/22 09/30/23 Yes Hill, Alain Honey, PA-C  phenytoin (DILANTIN) 100 MG ER capsule Take 100 mg by mouth 2 (two) times daily. 07/18/20  Yes [provider]  pregabalin (LYRICA) 150 MG capsule Take 150-300 mg by mouth See admin  instructions. Take 1 capsule by mouth twice a day, then take 2 capsules in the evening 11/12/22  Yes [provider]  Probiotic Product (ALIGN) 4 MG CAPS Take 4 mg by mouth in the morning and at bedtime.   Yes [provider]  salicyclic acid-sulfur (SEBULEX) 2-2 % shampoo Apply 1 application  topically daily as needed for itching. 07/13/22  Yes [provider]  Tiotropium Bromide Monohydrate (SPIRIVA RESPIMAT) 2.5 MCG/ACT AERS Inhale 1 each into the lungs daily.   Yes [provider]  traMADol (ULTRAM) 50 MG tablet Take 50 mg by mouth daily as needed for moderate pain.  07/14/22  Yes [provider]  urea (CARMOL) 20 % cream Apply 1 application  topically daily as needed (for bottom of feet). 10/12/22  Yes [provider]  vitamin C (ASCORBIC ACID) 500 MG tablet Take 500 mg by mouth See admin instructions. Take 1 tablet by mouth every 2 days   Yes [provider]  vitamin E 180 MG (400 UNITS) capsule Take 400 Units by mouth daily.   Yes [provider]  zinc sulfate 220 (50 Zn) MG capsule Take 220 mg by mouth every other day. 06/28/22  Yes [provider]  naloxone (NARCAN) nasal spray 4 mg/0.1 mL Place 1 spray into the nose See admin instructions. SPRAY 1 SPRAY INTO ONE NOSTRIL AS DIRECTED FOR OPIOID OVERDOSE (TURN PERSON ON SIDE AFTER DOSE. IF NO RESPONSE IN 2-3 MINUTES OR PERSON RESPONDS BUT RELAPSES, REPEAT USING A NEW SPRAY DEVICE AND SPRAY INTO THE OTHER NOSTRIL. CALL 911 AFTER USE.) * EMERGENCY USE ONLY * Patient not taking: Reported on 03/09/2023 01/16/20   [provider]    Physical Exam: Vitals:   03/09/23 0645 03/09/23 0700 03/09/23 0715 03/09/23 0844  BP: 126/73 134/78 127/70   Pulse: (!) 123 (!) 121 (!) 123   Resp: 14 (!) 28 16   Temp:    (!) 100.8 F (38.2 C)  TempSrc:    Oral  SpO2: 97% 99% 95%   Weight:       General:  Appears calm and comfortable and is in NAD Eyes:  PERRL, EOMI, normal lids, iris ENT:  HOH, lips & tongue, mmm; appropriate dentition Neck:  no LAD, masses or thyromegaly; no carotid bruits Cardiovascular:  tachycardic, regular rhythm, no m/r/g. No LE edema.  Respiratory:  normal effort. Crackles in LLL, otherwise wnl. No wheezing.  Abdomen:  soft, NT, ND, NABS Back:   normal alignment, no CVAT Skin:  no rash or induration seen on limited exam Musculoskeletal:  grossly normal tone BUE/BLE, good ROM, no bony abnormality Lower extremity:  No LE edema.  Limited foot exam with no ulcerations.  2+ distal pulses. Psychiatric:  grossly normal mood and affect, speech fluent and  appropriate, AOx3 Neurologic:  CN 2-12 grossly intact, moves all extremities in coordinated fashion, sensation intact   Radiological Exams on Admission: Independently reviewed - see discussion in A/P where applicable  DG Chest Port 1 View  Result Date: 03/09/2023 CLINICAL DATA:  57 year old male with shortness of breath. Possible sepsis. EXAM: PORTABLE CHEST 1 VIEW COMPARISON:  Chest x-ray 01/25/2021. FINDINGS: Lung volumes are normal. Diffuse interstitial prominence and patchy ill-defined opacities scattered throughout the lungs bilaterally, most evident in the mid to lower lungs. No pleural effusions. No pneumothorax. Heart size is normal. Upper mediastinal contours are within normal limits. IMPRESSION: 1. Abnormal chest x-ray with some findings that are likely related to chronic interstitial lung disease based on  comparison with prior chest CT, however, the possibility of superimposed acute multilobar bilateral bronchopneumonia should be considered. Further evaluation with repeat chest CT would provide additional diagnostic information if clinically appropriate. Electronically Signed   By: Trudie Reed M.D.   On: 03/09/2023 05:26    EKG: Independently reviewed.  Sinus tachy with rate 122; nonspecific ST changes with no evidence of acute ischemia. PVC-new since previous EKG.    Labs on Admission: I have personally reviewed the available labs and imaging studies at the time of the admission.  Pertinent labs:   sodium: 131,  lactic acid: 1.4  Assessment and Plan: Principal Problem:   Acute respiratory failure with hypoxia (HCC) Active Problems:   SIRS (systemic inflammatory response syndrome) (HCC)   Pneumonia   MS (multiple sclerosis) (HCC)   COPD (chronic obstructive pulmonary disease) (HCC)   CAD (coronary artery disease)   Acid reflux   NSIP (nonspecific interstitial pneumonitis) (HCC)   BPH (benign prostatic hyperplasia)   OSA on CPAP   Trigeminal neuralgia    Assessment  and Plan: * Acute respiratory failure with hypoxia (HCC) 57 year old male presenting with one week history of chills/sweats, fever and worsening cough found to be hypoxic on room air to 81% requiring 4L oxygen -obs to progressive  -CXR with possible multilobar pneumonia vs. ILD; however, in setting of fever lean toward infectious -with tachycardia/pleuritic chest pain and hypoxia in setting of recent covid will check stat CTA for PE -CTA also to look at lungs -no signs of COPD exacerbation on exam  -check troponin, EKG with no acute ST changes, new PVC -wean oxygen as tolerated    SIRS (systemic inflammatory response syndrome) (HCC) SIRS criteria with tachycardia and tacypnea in setting of possible pneumonia Lactic acid wnl, BC pending  UA clear  W/u for pneumonia  Pneumonia Per chart review history of recurrent pneumonia Diagnosed with covid 9/2 with resolution of symptoms  CXR with ILD vs. Super imposed acute multilobar pneumonia Report fever and SIRS criteria with LLL crackles Received vancomycin and cefepime in ED, change to rocephin and azithromycin.  Completed 4 days of doxycycline outpatient  Urine antigens pending BC pending Check RVP and PCT  Sputum culture  Scheduled duonebs/pulmicort. SABA prn  Flutter valve/IS to bedside  Continue gentle IVF   MS (multiple sclerosis) (HCC) on ocrelizumab - he had been on Tysabri until 2017.  Changed to Kesimpta in April 2024 Followed [facility]    CAD (coronary artery disease) Stress test in 07/2022 with no ischemia Continue high dose statin and ASA   COPD (chronic obstructive pulmonary disease) (HCC) No wheezing on exam to suggest exacerbation at this time Covered with rocephin, low threshold to start steroids if needed   Acid reflux Can not tolerate protonix Continue his pepcid, resume nexium at d/c.   NSIP (nonspecific interstitial pneumonitis) (HCC) Has not seen pulm in years and unaware of this diagnosis On  pulmicort BID F/u on CTA chest Possibly pulm consult if pneumonia ruled out vs. Follow up outpatient   BPH (benign prostatic hyperplasia) Continue proscar and uroxatral   OSA on CPAP Continue cpap at night   Trigeminal neuralgia He was on oxcarbazepine, but he developed severe hyponatrenmia.  Continue his phenytoin. baclofen. pregabilin     Advance Care Planning:   Code Status: Full Code   Consults: RT   DVT Prophylaxis: lovenox   Family Communication: none   Severity of Illness: The appropriate patient status for this patient is OBSERVATION.  Observation status is judged to be reasonable and necessary in order to provide the required intensity of service to ensure the patient's safety. The patient's presenting symptoms, physical exam findings, and initial radiographic and laboratory data in the context of their medical condition is felt to place them at decreased risk for further clinical deterioration. Furthermore, it is anticipated that the patient will be medically stable for discharge from the hospital within 2 midnights of admission.   Author: Orland Mustard, MD 03/09/2023 9:31 AM  For on call review [email or web address].

## 2023-03-09 NOTE — Progress Notes (Signed)
Pt has CPAP orders, pt stated he doesn't want to wear CPAP for the night.

## 2023-03-09 NOTE — Assessment & Plan Note (Signed)
Continue proscar and uroxatral

## 2023-03-09 NOTE — ED Notes (Signed)
ED TO INPATIENT HANDOFF REPORT  ED Nurse Name and Phone #: Ines Bloomer [phone]  S Name/Age/Gender Randy Greene 57 y.o. male Room/Bed: 044C/044C  Code Status   Code Status: Full Code  Home/SNF/Other Home Patient oriented to: self, place, time, and situation Is this baseline? Yes   Triage Complete: Triage complete  Chief Complaint Acute respiratory failure with hypoxia (HCC) [J96.01]  Triage Note Presents via EMS from home for CP, SOB, N/V, productive cough, night sweat.   H/o COPD and MS. Diagnosed with Covid 2 weeks ago. CP and SOB since that time. Has also continued to be febrile (101F).  EMS exam: rhochi L lung, 160/82, 127bpm, 20RR, 81% RA which improved to 94% on 4 L. 101F. A&Ox4.   Trialed on RA in triage exam room, dropped to 89%, returned to [place] at 2L which improved spO2 to 94%.  He is not on supplemental oxygen at baseline   Allergies Allergies  Allergen Reactions   Penicillins Hives, Other (See Comments) and Rash    "My face got flush"   Tape Rash   Zoloft [Sertraline] Rash   Food Nausea And Vomiting and Other (See Comments)    GREEN PEPPERS- flushed  PT DOES NOT EAT MEAT   Morphine Itching   Mushroom Extract Complex Nausea And Vomiting and Other (See Comments)    And flushed Other reaction(s): Other (See Comments) And flushed   Penicillin G Rash   Pravastatin Other (See Comments)    Level of Care/Admitting Diagnosis ED Disposition     ED Disposition  Admit   Condition  --   Comment  Hospital Area: [facility] [[id number]]  Level of Care: Progressive [102]  Admit to Progressive based on following criteria: MULTISYSTEM THREATS such as stable sepsis, metabolic/electrolyte imbalance with or without encephalopathy that is responding to early treatment.  May admit patient to [facility] or [facility] if equivalent level of care is available:: No  Covid Evaluation: Recent COVID positive no isolation required infection day 21-90  Diagnosis:  Acute respiratory failure with hypoxia [facility]) [[id number]]  Admitting Physician: Orland Mustard [[id number]]  Attending Physician: Orland Mustard [phone]  Certification:: I certify there are rare and unusual circumstances requiring inpatient admission  Expected Medical Readiness: 03/11/2023          B Medical/Surgery History Past Medical History:  Diagnosis Date   Acid reflux    Arthritis    Aspiration pneumonia (HCC)    Asthma    COPD (chronic obstructive pulmonary disease) (HCC)    Delayed wound healing    left knee   Dyspnea    Enlarged prostate    Hypercapnic respiratory failure, chronic (HCC)    Hypertension    MS (multiple sclerosis) (HCC)    Past Surgical History:  Procedure Laterality Date   ARTHROSCOPIC Left    BRONCHIAL WASHINGS  01/14/2021   Procedure: BRONCHIAL WASHINGS;  Surgeon: Lorin Glass, MD;  Location: [facility] ENDOSCOPY;  Service: Pulmonary;;   HERNIA REPAIR     INCISION AND DRAINAGE Left 01/04/2017   Procedure: INCISION AND DRAINAGE LEFT KNEE;  Surgeon: Samson Frederic, MD;  Location: [facility] OR;  Service: Orthopedics;  Laterality: Left;   jawsurgery     KNEE ARTHROPLASTY Left 08/10/2016   Procedure: LEFT TOTAL KNEE ARTHROPLASTY WITH COMPUTER NAVIGATION;  Surgeon: Samson Frederic, MD;  Location: [facility] OR;  Service: Orthopedics;  Laterality: Left;   KNEE ARTHROPLASTY Right 09/30/2022   Procedure: COMPUTER ASSISTED TOTAL KNEE ARTHROPLASTY;  Surgeon: Samson Frederic, MD;  Location: [facility]  ORS;  Service: Orthopedics;  Laterality: Right;  160   knees scope Right    TONSILLECTOMY AND ADENOIDECTOMY     VIDEO BRONCHOSCOPY Bilateral 01/14/2021   Procedure: VIDEO BRONCHOSCOPY WITHOUT FLUORO;  Surgeon: Lorin Glass, MD;  Location: [facility] ENDOSCOPY;  Service: Pulmonary;  Laterality: Bilateral;     A IV Location/Drains/Wounds Patient Lines/Drains/Airways Status     Active Line/Drains/Airways     Name Placement date Placement time Site Days   Peripheral IV 03/09/23 20 G Right  Antecubital 03/09/23  0511  Antecubital  less than 1            Intake/Output Last 24 hours  Intake/Output Summary (Last 24 hours) at 03/09/2023 1759 Last data filed at 03/09/2023 1325 Gross per 24 hour  Intake 1454.49 ml  Output --  Net 1454.49 ml    Labs/Imaging Results for orders placed or performed during the hospital encounter of 03/09/23 (from the past 48 hour(s))  Resp panel by RT-PCR (RSV, Flu A&B, Covid) Anterior Nasal Swab     Status: Abnormal   Collection Time: 03/09/23  5:01 AM   Specimen: Anterior Nasal Swab  Result Value Ref Range   SARS Coronavirus 2 by RT PCR POSITIVE (A) NEGATIVE   Influenza A by PCR NEGATIVE NEGATIVE   Influenza B by PCR NEGATIVE NEGATIVE    Comment: (NOTE) The Xpert Xpress SARS-CoV-2/FLU/RSV plus assay is intended as an aid in the diagnosis of influenza from Nasopharyngeal swab specimens and should not be used as a sole basis for treatment. Nasal washings and aspirates are unacceptable for Xpert Xpress SARS-CoV-2/FLU/RSV testing.  Fact Sheet for Patients: BloggerCourse.com  Fact Sheet for Healthcare Providers: SeriousBroker.it  This test is not yet approved or cleared by the [place] FDA and has been authorized for detection and/or diagnosis of SARS-CoV-2 by FDA under an Emergency Use Authorization (EUA). This EUA will remain in effect (meaning this test can be used) for the duration of the COVID-19 declaration under Section 564(b)(1) of the Act, 21 U.S.C. section 360bbb-3(b)(1), unless the authorization is terminated or revoked.     Resp Syncytial Virus by PCR NEGATIVE NEGATIVE    Comment: (NOTE) Fact Sheet for Patients: BloggerCourse.com  Fact Sheet for Healthcare Providers: SeriousBroker.it  This test is not yet approved or cleared by the [place] FDA and has been authorized for detection and/or diagnosis of  SARS-CoV-2 by FDA under an Emergency Use Authorization (EUA). This EUA will remain in effect (meaning this test can be used) for the duration of the COVID-19 declaration under Section 564(b)(1) of the Act, 21 U.S.C. section 360bbb-3(b)(1), unless the authorization is terminated or revoked.  Performed at [facility], [address], [place], [place] [address]   Comprehensive metabolic panel     Status: Abnormal   Collection Time: 03/09/23  5:10 AM  Result Value Ref Range   Sodium 131 (L) 135 - 145 mmol/L   Potassium 3.7 3.5 - 5.1 mmol/L   Chloride 95 (L) 98 - 111 mmol/L   CO2 23 22 - 32 mmol/L   Glucose, Bld 121 (H) 70 - 99 mg/dL    Comment: Glucose reference range applies only to samples taken after fasting for at least 8 hours.   BUN 10 6 - 20 mg/dL   Creatinine, Ser 4.78 0.61 - 1.24 mg/dL   Calcium 8.7 (L) 8.9 - 10.3 mg/dL   Total Protein 6.7 6.5 - 8.1 g/dL   Albumin 2.9 (L) 3.5 - 5.0 g/dL   AST  32 15 - 41 U/L   ALT 21 0 - 44 U/L   Alkaline Phosphatase 73 38 - 126 U/L   Total Bilirubin 0.4 0.3 - 1.2 mg/dL   GFR, Estimated >02 >54 mL/min    Comment: (NOTE) Calculated using the CKD-EPI Creatinine Equation (2021)    Anion gap 13 5 - 15    Comment: Performed at [facility], [address] [place], [place] [address]  CBC with Differential     Status: Abnormal   Collection Time: 03/09/23  5:10 AM  Result Value Ref Range   WBC 4.1 4.0 - 10.5 K/uL   RBC 4.32 4.22 - 5.81 MIL/uL   Hemoglobin 12.2 (L) 13.0 - 17.0 g/dL   HCT 37.6 (L) 28.3 - 15.1 %   MCV 84.7 80.0 - 100.0 fL   MCH 28.2 26.0 - 34.0 pg   MCHC 33.3 30.0 - 36.0 g/dL   RDW 76.1 60.7 - 37.1 %   Platelets 322 150 - 400 K/uL   nRBC 0.0 0.0 - 0.2 %   Neutrophils Relative % 86 %   Neutro Abs 3.5 1.7 - 7.7 K/uL   Lymphocytes Relative 7 %   Lymphs Abs 0.3 (L) 0.7 - 4.0 K/uL   Monocytes Relative 6 %   Monocytes Absolute 0.3 0.1 - 1.0 K/uL   Eosinophils Relative 0 %   Eosinophils Absolute 0.0 0.0 - 0.5  K/uL   Basophils Relative 0 %   Basophils Absolute 0.0 0.0 - 0.1 K/uL   Immature Granulocytes 1 %   Abs Immature Granulocytes 0.03 0.00 - 0.07 K/uL    Comment: Performed at [facility], [address] [place], [place] [address]  Protime-INR     Status: Abnormal   Collection Time: 03/09/23  5:10 AM  Result Value Ref Range   Prothrombin Time 15.3 (H) 11.4 - 15.2 seconds   INR 1.2 0.8 - 1.2    Comment: (NOTE) INR goal varies based on device and disease states. Performed at [facility], [address], [place], [place] [address]   APTT     Status: None   Collection Time: 03/09/23  5:10 AM  Result Value Ref Range   aPTT 34 24 - 36 seconds    Comment: Performed at [facility], [address], [place], [place] [address]  Blood Culture (routine x 2)     Status: None (Preliminary result)   Collection Time: 03/09/23  5:10 AM   Specimen: BLOOD  Result Value Ref Range   Specimen Description BLOOD RIGHT ANTECUBITAL    Special Requests      BOTTLES DRAWN AEROBIC AND ANAEROBIC Blood Culture results may not be optimal due to an inadequate volume of blood received in culture bottles   Culture      NO GROWTH < 12 HOURS Performed at [facility], [address], [place], [place] [address]    Report Status PENDING   Blood Culture (routine x 2)     Status: None (Preliminary result)   Collection Time: 03/09/23  5:10 AM   Specimen: BLOOD  Result Value Ref Range   Specimen Description BLOOD LEFT ANTECUBITAL    Special Requests      BOTTLES DRAWN AEROBIC AND ANAEROBIC Blood Culture adequate volume   Culture      NO GROWTH < 12 HOURS Performed at [facility], [address] [place], [place] [address]    Report Status PENDING   I-Stat  Lactic Acid, ED     Status: None   Collection Time: 03/09/23  5:49 AM  Result Value Ref Range   Lactic Acid, Venous 1.4 0.5 - 1.9 mmol/L  Urinalysis, w/ Reflex to Culture (Infection Suspected) -Urine, Clean Catch     Status:  None   Collection Time: 03/09/23  8:00 AM  Result Value Ref Range   Specimen Source URINE, CLEAN CATCH    Color, Urine YELLOW YELLOW   APPearance CLEAR CLEAR   Specific Gravity, Urine 1.014 1.005 - 1.030   pH 5.0 5.0 - 8.0   Glucose, UA NEGATIVE NEGATIVE mg/dL   Hgb urine dipstick NEGATIVE NEGATIVE   Bilirubin Urine NEGATIVE NEGATIVE   Ketones, ur NEGATIVE NEGATIVE mg/dL   Protein, ur NEGATIVE NEGATIVE mg/dL   Nitrite NEGATIVE NEGATIVE   Leukocytes,Ua NEGATIVE NEGATIVE   RBC / HPF 0-5 0 - 5 RBC/hpf   WBC, UA 0-5 0 - 5 WBC/hpf    Comment:        Reflex urine culture not performed if WBC <=10, OR if Squamous epithelial cells >5. If Squamous epithelial cells >5 suggest recollection.    Bacteria, UA NONE SEEN NONE SEEN   Squamous Epithelial / HPF 0-5 0 - 5 /HPF   Mucus PRESENT     Comment: Performed at [facility], [address] [place], [place] [address]  Troponin I (High Sensitivity)     Status: None   Collection Time: 03/09/23  9:22 AM  Result Value Ref Range   Troponin I (High Sensitivity) 9 <18 ng/L    Comment: (NOTE) Elevated high sensitivity troponin I (hsTnI) values and significant  changes across serial measurements may suggest ACS but many other  chronic and acute conditions are known to elevate hsTnI results.  Refer to the "Links" section for chest pain algorithms and additional  guidance. Performed at [facility], [address], [place], [place] [address]   I-Stat Lactic Acid, ED     Status: None   Collection Time: 03/09/23 10:55 AM  Result Value Ref Range   Lactic Acid, Venous 1.7 0.5 - 1.9 mmol/L  Respiratory (~20 pathogens) panel by PCR     Status: None   Collection Time: 03/09/23 11:15 AM   Specimen: Nasopharyngeal Swab; Respiratory  Result Value Ref Range   Adenovirus NOT DETECTED NOT DETECTED   Coronavirus 229E NOT DETECTED NOT DETECTED    Comment: (NOTE) The Coronavirus on the Respiratory Panel, DOES NOT test for the novel   Coronavirus (2019 nCoV)    Coronavirus HKU1 NOT DETECTED NOT DETECTED   Coronavirus NL63 NOT DETECTED NOT DETECTED   Coronavirus OC43 NOT DETECTED NOT DETECTED   Metapneumovirus NOT DETECTED NOT DETECTED   Rhinovirus / Enterovirus NOT DETECTED NOT DETECTED   Influenza A NOT DETECTED NOT DETECTED   Influenza B NOT DETECTED NOT DETECTED   Parainfluenza Virus 1 NOT DETECTED NOT DETECTED   Parainfluenza Virus 2 NOT DETECTED NOT DETECTED   Parainfluenza Virus 3 NOT DETECTED NOT DETECTED   Parainfluenza Virus 4 NOT DETECTED NOT DETECTED   Respiratory Syncytial Virus NOT DETECTED NOT DETECTED   Bordetella pertussis NOT DETECTED NOT DETECTED   Bordetella Parapertussis NOT DETECTED NOT DETECTED   Chlamydophila pneumoniae NOT DETECTED NOT DETECTED   Mycoplasma pneumoniae NOT DETECTED NOT DETECTED    Comment: Performed at [facility], [address], [place], [place] [address]  HIV Antibody (routine testing w rflx)     Status: None   Collection  Time: 03/09/23 11:15 AM  Result Value Ref Range   HIV Screen 4th Generation wRfx Non Reactive Non Reactive    Comment: Performed at [facility], [address] [place], [place] [address]  Strep pneumoniae urinary antigen     Status: None   Collection Time: 03/09/23 12:46 PM  Result Value Ref Range   Strep Pneumo Urinary Antigen NEGATIVE NEGATIVE    Comment:        Infection due to S. pneumoniae cannot be absolutely ruled out since the antigen present may be below the detection limit of the test. Performed at [facility], [address], [place], [place] [address]    CT Angio Chest Pulmonary Embolism (PE) W or WO Contrast  Result Date: 03/09/2023 CLINICAL DATA:  Short of breath, chest pain, hypoxia EXAM: CT ANGIOGRAPHY CHEST WITH CONTRAST TECHNIQUE: Multidetector CT imaging of the chest was performed using the standard protocol during bolus administration of intravenous contrast. Multiplanar CT image reconstructions and MIPs  were obtained to evaluate the vascular anatomy. RADIATION DOSE REDUCTION: This exam was performed according to the departmental dose-optimization program which includes automated exposure control, adjustment of the mA and/or kV according to patient size and/or use of iterative reconstruction technique. CONTRAST:  75mL OMNIPAQUE IOHEXOL 350 MG/ML SOLN COMPARISON:  Prior CT scan of the chest 03/03/2021 FINDINGS: Cardiovascular: Satisfactory opacification of the pulmonary arteries to the segmental level. No evidence of pulmonary embolism. Mild cardiomegaly. No pericardial effusion. Mediastinum/Nodes: Unremarkable CT appearance of the thyroid gland. Mildly enlarged mediastinal lymph nodes are highly likely to be reactive in nature. No soft tissue mediastinal mass. Small hiatal hernia. Lungs/Pleura: Background of moderately severe centrilobular emphysema. Multifocal patchy foci of ground-glass attenuation airspace opacity scattered throughout all lobes of both lungs. This is new compared to prior imaging from 2022. Mild dependent atelectasis is present. Diffuse mild bronchial wall thickening. No pleural effusion. A probable intra fissural lymph node along the minor fissure measures up to 8 mm. Upper Abdomen: No acute abnormality. Musculoskeletal: No chest wall abnormality. No acute or significant osseous findings. Review of the MIP images confirms the above findings. IMPRESSION: 1. Negative for acute pulmonary embolus. 2. Multifocal patchy foci of ground-glass attenuation airspace opacity scattered throughout all lobes of both lungs. Findings are most consistent with multi lobar pneumonia or atypical/viral respiratory infection. Multi lobar alveolitis or alveolar hemorrhage are less likely possibilities. 3. Background of moderately severe centrilobular pulmonary emphysema. 4. Small hiatal hernia. 5. Mild splenomegaly. 6. Likely reactive mediastinal and fissural lymph nodes. Emphysema (ICD10-J43.9). Electronically Signed    By: Malachy Moan M.D.   On: 03/09/2023 14:47   DG Chest Port 1 View  Result Date: 03/09/2023 CLINICAL DATA:  57 year old male with shortness of breath. Possible sepsis. EXAM: PORTABLE CHEST 1 VIEW COMPARISON:  Chest x-ray 01/25/2021. FINDINGS: Lung volumes are normal. Diffuse interstitial prominence and patchy ill-defined opacities scattered throughout the lungs bilaterally, most evident in the mid to lower lungs. No pleural effusions. No pneumothorax. Heart size is normal. Upper mediastinal contours are within normal limits. IMPRESSION: 1. Abnormal chest x-ray with some findings that are likely related to chronic interstitial lung disease based on comparison with prior chest CT, however, the possibility of superimposed acute multilobar bilateral bronchopneumonia should be considered. Further evaluation with repeat chest CT would provide additional diagnostic information if clinically appropriate. Electronically Signed   By: Trudie Reed M.D.   On: 03/09/2023 05:26    Pending Labs Unresulted Labs (From admission, onward)  Start     Ordered   03/10/23 0500  Basic metabolic panel  Tomorrow morning,   R        03/09/23 0911   03/10/23 0500  CBC  Tomorrow morning,   R        03/09/23 0911   03/09/23 1124  Procalcitonin  Once,   R       References:    Procalcitonin Lower Respiratory Tract Infection AND Sepsis Procalcitonin Algorithm   03/09/23 1124   03/09/23 0910  Legionella Pneumophila Serogp 1 Ur Ag  (COPD / Pneumonia / Cellulitis / Lower Extremity Wound)  Once,   R        03/09/23 0911   03/09/23 0803  Expectorated Sputum Assessment w Gram Stain, Rflx to Resp Cult  Once,   R        03/09/23 0803            Vitals/Pain Today's Vitals   03/09/23 1545 03/09/23 1615 03/09/23 1700 03/09/23 1754  BP: 129/83 132/84 (!) 140/97 (!) 146/92  Pulse: 81 88 86 91  Resp:  10 18 (!) 26  Temp:    97.7 F (36.5 C)  TempSrc:    Oral  SpO2: 98% 96% 96% 92%  Weight:      Height:       PainSc:        Isolation Precautions Droplet precaution  Medications Medications  lactated ringers infusion ( Intravenous Rate/Dose Change 03/09/23 1133)  acetaminophen (TYLENOL) tablet 650 mg (650 mg Oral Given 03/09/23 1110)  acetaminophen-codeine (TYLENOL #3) 300-30 MG per tablet 1 tablet (has no administration in time range)  aspirin EC tablet 81 mg (has no administration in time range)  traMADol (ULTRAM) tablet 50 mg (has no administration in time range)  dronabinol (MARINOL) capsule 5 mg (has no administration in time range)  famotidine (PEPCID) tablet 40 mg (has no administration in time range)  metoCLOPramide (REGLAN) tablet 10 mg (10 mg Oral Given 03/09/23 1724)  acidophilus (RISAQUAD) capsule 1 capsule (1 capsule Oral Given 03/09/23 1621)  alfuzosin (UROXATRAL) 24 hr tablet 10 mg (has no administration in time range)  finasteride (PROSCAR) tablet 5 mg (has no administration in time range)  baclofen (LIORESAL) tablet 20 mg (20 mg Oral Given 03/09/23 1724)  phenytoin (DILANTIN) ER capsule 100 mg (100 mg Oral Given 03/09/23 1433)  magnesium oxide (MAG-OX) tablet 400 mg (400 mg Oral Given 03/09/23 1433)  fluticasone (FLONASE) 50 MCG/ACT nasal spray 1 spray (has no administration in time range)  montelukast (SINGULAIR) tablet 10 mg (has no administration in time range)  polyvinyl alcohol (LIQUIFILM TEARS) 1.4 % ophthalmic solution 1 drop (has no administration in time range)  diclofenac Sodium (VOLTAREN) 1 % topical gel 2 g (has no administration in time range)  enoxaparin (LOVENOX) injection 40 mg (40 mg Subcutaneous Given 03/09/23 1434)  cefTRIAXone (ROCEPHIN) 2 g in sodium chloride 0.9 % 100 mL IVPB (0 g Intravenous Stopped 03/09/23 1325)  azithromycin (ZITHROMAX) 500 mg in sodium chloride 0.9 % 250 mL IVPB (0 mg Intravenous Stopped 03/09/23 1239)  ondansetron (ZOFRAN) tablet 4 mg (has no administration in time range)    Or  ondansetron (ZOFRAN) injection 4 mg (has no administration in  time range)  senna-docusate (Senokot-S) tablet 1 tablet (has no administration in time range)  budesonide (PULMICORT) nebulizer solution 0.25 mg (0.25 mg Nebulization Given 03/09/23 1243)  albuterol (PROVENTIL) (2.5 MG/3ML) 0.083% nebulizer solution 2.5 mg (has no administration in time range)  pregabalin (LYRICA)  capsule 150 mg (150 mg Oral Given 03/09/23 1603)    And  pregabalin (LYRICA) capsule 300 mg (has no administration in time range)  ipratropium-albuterol (DUONEB) 0.5-2.5 (3) MG/3ML nebulizer solution 3 mL (3 mLs Nebulization Given 03/09/23 1711)  ceFEPIme (MAXIPIME) 2 g in sodium chloride 0.9 % 100 mL IVPB (0 g Intravenous Stopped 03/09/23 0629)  metroNIDAZOLE (FLAGYL) IVPB 500 mg (0 mg Intravenous Stopped 03/09/23 0823)  lactated ringers bolus 1,000 mL (0 mLs Intravenous Stopped 03/09/23 0629)  vancomycin (VANCOREADY) IVPB 2000 mg/400 mL (0 mg Intravenous Stopped 03/09/23 1039)  iohexol (OMNIPAQUE) 350 MG/ML injection 75 mL (75 mLs Intravenous Contrast Given 03/09/23 1106)    Mobility walks with device     Focused Assessments Pulmonary Assessment Handoff:  Lung sounds: Bilateral Breath Sounds: Clear R Breath Sounds: Fine crackles (RLL) O2 Device: Nasal Cannula O2 Flow Rate (L/min): 2 L/min    R Recommendations: See Admitting Provider Note  Report given to:   Additional Notes:

## 2023-03-09 NOTE — Assessment & Plan Note (Signed)
57 year old male presenting with one week history of chills/sweats, fever and worsening cough found to be hypoxic on room air to 81% requiring 4L oxygen -obs to progressive  -CXR with possible multilobar pneumonia vs. ILD; however, in setting of fever lean toward infectious -with tachycardia/pleuritic chest pain and hypoxia in setting of recent covid will check stat CTA for PE -CTA also to look at lungs -no signs of COPD exacerbation on exam  -check troponin, EKG with no acute ST changes, new PVC -wean oxygen as tolerated

## 2023-03-09 NOTE — Sepsis Progress Note (Signed)
Elink monitoring for the code sepsis protocol.  

## 2023-03-09 NOTE — Assessment & Plan Note (Signed)
No wheezing on exam to suggest exacerbation at this time Covered with rocephin, low threshold to start steroids if needed

## 2023-03-09 NOTE — Assessment & Plan Note (Signed)
Stress test in 07/2022 with no ischemia Continue high dose statin and ASA

## 2023-03-09 NOTE — Assessment & Plan Note (Signed)
SIRS criteria with tachycardia and tacypnea in setting of possible pneumonia Lactic acid wnl, BC pending  UA clear  W/u for pneumonia

## 2023-03-09 NOTE — Assessment & Plan Note (Signed)
Can not tolerate protonix Continue his pepcid, resume nexium at d/c.

## 2023-03-09 NOTE — Assessment & Plan Note (Addendum)
on ocrelizumab - he had been on Tysabri until 2017.  Changed to Kesimpta in April 2024 Followed by neurology at [facility]

## 2023-03-09 NOTE — ED Notes (Signed)
Patient transported to CT 

## 2023-03-09 NOTE — ED Provider Notes (Signed)
[facility] Emergency Department Provider Note MRN:  [id number]  Arrival date & time: 03/09/23     Chief Complaint   SOB and cough  History of Present Illness   Randy Greene is a 57 y.o. year-old male presents to the ED with chief complaint of SOB and cough.  States that he had COVID 2 weeks ago.  Reports persistent SOB and cough.  Reports persistent fevers to as high as 101 at home.  EMS called out for worsening SOB and chest pain.  He was found to be hypoxic to 84% on RA.  Was put on 2L [place] and recovered to 94%.  States that he has been on oral steroids for the past week or so.    Hx of MS.  History provided by patient.   Review of Systems  Pertinent positive and negative review of systems noted in HPI.    Physical Exam   Vitals:   03/09/23 0451 03/09/23 0453  BP:  137/79  Pulse:  (!) 122  Resp: (!) 24   Temp: 99.1 F (37.3 C)   SpO2:  97%    CONSTITUTIONAL:  ill-appearing, NAD NEURO:  Alert and oriented x 3, CN 3-12 grossly intact EYES:  eyes equal and reactive ENT/NECK:  Supple, no stridor  CARDIO:  tachycardic, regular rhythm, appears well-perfused  PULM:  No respiratory distress, diminished, mildly tachypneic GI/GU:  non-distended,  MSK/SPINE:  No gross deformities, no edema, moves all extremities  SKIN:  no rash, atraumatic   *Additional and/or pertinent findings included in MDM below  Diagnostic and Interventional Summary    EKG Interpretation Date/Time:  Tuesday March 09 2023 04:55:32 EDT Ventricular Rate:  122 PR Interval:  151 QRS Duration:  104 QT Interval:  309 QTC Calculation: 439 R Axis:   2  Text Interpretation: Sinus tachycardia Multiform ventricular premature complexes Confirmed by Nicanor Alcon, April ([id number]) on 03/09/2023 5:02:49 AM       Labs Reviewed  COMPREHENSIVE METABOLIC PANEL - Abnormal; Notable for the following components:      Result Value   Sodium 131 (*)    Chloride 95 (*)    Glucose, Bld 121 (*)     Calcium 8.7 (*)    Albumin 2.9 (*)    All other components within normal limits  CBC WITH DIFFERENTIAL/PLATELET - Abnormal; Notable for the following components:   Hemoglobin 12.2 (*)    HCT 36.6 (*)    Lymphs Abs 0.3 (*)    All other components within normal limits  PROTIME-INR - Abnormal; Notable for the following components:   Prothrombin Time 15.3 (*)    All other components within normal limits  RESP PANEL BY RT-PCR (RSV, FLU A&B, COVID)  [id number]  CULTURE, BLOOD (ROUTINE X 2)  CULTURE, BLOOD (ROUTINE X 2)  APTT  URINALYSIS, W/ REFLEX TO CULTURE (INFECTION SUSPECTED)  I-STAT CG4 LACTIC ACID, ED    DG Chest Port 1 View  Final Result      Medications  lactated ringers infusion ( Intravenous New Bag/Given 03/09/23 0527)  metroNIDAZOLE (FLAGYL) IVPB 500 mg (has no administration in time range)  lactated ringers bolus 1,000 mL (1,000 mLs Intravenous New Bag/Given 03/09/23 0511)  vancomycin (VANCOREADY) IVPB 2000 mg/400 mL (has no administration in time range)  ceFEPIme (MAXIPIME) 2 g in sodium chloride 0.9 % 100 mL IVPB (2 g Intravenous New Bag/Given 03/09/23 0528)     Procedures  /  Critical Care .Critical Care  Performed by: Roxy Horseman, PA-C Authorized by:  Roxy Horseman, PA-C   Critical care provider statement:    Critical care time (minutes):  46   Critical care was necessary to treat or prevent imminent or life-threatening deterioration of the following conditions:  Respiratory failure   Critical care was time spent personally by me on the following activities:  Development of treatment plan with patient or surrogate, discussions with consultants, evaluation of patient's response to treatment, examination of patient, ordering and review of laboratory studies, ordering and review of radiographic studies, ordering and performing treatments and interventions, pulse oximetry, re-evaluation of patient's condition and review of old charts   ED Course and Medical Decision  Making  I have reviewed the triage vital signs, the nursing notes, and pertinent available records from the EMR.  Social Determinants Affecting Complexity of Care: Patient has no clinically significant social determinants affecting this chief complaint..   ED Course: Clinical Course as of 03/09/23 0608  Tue Mar 09, 2023  0603 Pulse Rate(!): 122 Tachycardic, home temperatures to 101, new O2 requirement.  Worrisome for SIRS.  Code sepsis activated. [RB]  0606 DG Chest Port 1 View Abnormal CXR, ?multilobar pneumonia [RB]  0607 I-Stat Lactic Acid, ED Normal lactic [RB]    Clinical Course User Index [RB] Roxy Horseman, PA-C    Medical Decision Making Patient here with SOB, new O2 requirement, cough and fever.  Recent COVID 19 diagnosis.  Has been running fevers to 101 at home.  Will check labs and reassess.    Amount and/or Complexity of Data Reviewed Labs: ordered. Decision-making details documented in ED Course. Radiology: ordered and independent interpretation performed. Decision-making details documented in ED Course. ECG/medicine tests: ordered and independent interpretation performed. Decision-making details documented in ED Course.  Risk Prescription drug management. Decision regarding hospitalization.         Consultants: I consulted with Hospitalist, Dr. Lazarus Salines, who is appreciated for accepting patient.  Patient to be seen by morning team.   Treatment and Plan: Patient's exam and diagnostic results are concerning for acute hypoxic respiratory failure with pneumonia and sepsis.  Feel that patient will need admission to the hospital for further treatment and evaluation.    Final Clinical Impressions(s) / ED Diagnoses     ICD-10-CM   1. Sepsis, due to unspecified organism, unspecified whether acute organ dysfunction present (HCC)  A41.9     2. Pneumonia due to infectious organism, unspecified laterality, unspecified part of lung  J18.9       ED Discharge  Orders     None         Discharge Instructions Discussed with and Provided to Patient:   Discharge Instructions   None      Roxy Horseman, PA-C 03/09/23 1610    Palumbo, April, MD 03/09/23 [phone]

## 2023-03-10 DIAGNOSIS — J9601 Acute respiratory failure with hypoxia: Secondary | ICD-10-CM | POA: Diagnosis not present

## 2023-03-10 DIAGNOSIS — J8489 Other specified interstitial pulmonary diseases: Secondary | ICD-10-CM | POA: Diagnosis not present

## 2023-03-10 DIAGNOSIS — J42 Unspecified chronic bronchitis: Secondary | ICD-10-CM

## 2023-03-10 DIAGNOSIS — J189 Pneumonia, unspecified organism: Secondary | ICD-10-CM

## 2023-03-10 LAB — C-REACTIVE PROTEIN: CRP: 13.9 mg/dL — ABNORMAL HIGH (ref <1.0)

## 2023-03-10 LAB — PROCALCITONIN: Procalcitonin: 1.54 ng/mL

## 2023-03-10 MED ORDER — ARFORMOTEROL TARTRATE 15 MCG/2ML IN NEBU
15.0000 ug | INHALATION_SOLUTION | Freq: Two times a day (BID) | RESPIRATORY_TRACT | Status: DC
  Administered 2023-03-10 – 2023-03-12 (×5): 15 ug via RESPIRATORY_TRACT
  Filled 2023-03-10 (×6): qty 2

## 2023-03-10 MED ORDER — REVEFENACIN 175 MCG/3ML IN SOLN
175.0000 ug | Freq: Every day | RESPIRATORY_TRACT | Status: DC
  Administered 2023-03-10 – 2023-03-12 (×3): 175 ug via RESPIRATORY_TRACT
  Filled 2023-03-10 (×3): qty 3

## 2023-03-10 NOTE — Progress Notes (Addendum)
PROGRESS NOTE        PATIENT DETAILS Name: Randy Greene Age: 57 y.o. Sex: male Date of Birth: 14-Aug-1965 Admit Date: 03/09/2023 Admitting Physician Orland Mustard, MD [id number]  Brief Summary: Patient is a 57 y.o.  male with history of COPD, ILD, multiple sclerosis, trigeminal neuralgia, CAD, OSA on CPAP-who had COVID-19 infection earlier this month-presented to the hospital with several days of worsening shortness of breath/subjective fever-found to have multifocal pneumonia and subsequently admitted to the hospitalist service.  Significant events: 9/17>> admit to [facility]  Significant studies: 9/17>> CTA chest: No PE, multifocal PNA  Significant microbiology data: 9/17>> COVID PCR: Positive 9/17>> influenza/RSV PCR: Negative 9/17>> respiratory virus panel: Negative 9/17>> blood culture: No growth  Procedures: None  Consults: None  Subjective: Lying comfortably in bed-denies any chest pain or shortness of breath.  Feels better than yesterday  Objective: Vitals: Blood pressure 124/76, pulse 100, temperature 98.8 F (37.1 C), temperature source Oral, resp. rate 11, height 6\' 2"  (1.88 m), weight 86.2 kg, SpO2 96%.   Exam: Gen Exam:Alert awake-not in any distress HEENT:atraumatic, normocephalic Chest: B/L clear to auscultation anteriorly CVS:S1S2 regular Abdomen:soft non tender, non distended Extremities:no edema Neurology: Non focal Skin: no rash  Pertinent Labs/Radiology:    Latest Ref Rng & Units 03/10/2023    4:36 AM 03/09/2023    5:10 AM 09/21/2022   12:08 PM  CBC  WBC 4.0 - 10.5 K/uL 3.8  4.1  7.4   Hemoglobin 13.0 - 17.0 g/dL 74.2  59.5  63.8   Hematocrit 39.0 - 52.0 % 32.8  36.6  39.1   Platelets 150 - 400 K/uL 326  322  256     Lab Results  Component Value Date   NA 134 (L) 03/10/2023   K 3.5 03/10/2023   CL 100 03/10/2023   CO2 25 03/10/2023      Assessment/Plan: Acute hypoxic respiratory failure secondary to  multifocal PNA Unclear whether this is bacterial pneumonia post COVID pneumonitis Although procalcitonin is mildly elevated Check CRP Continue empiric antibiotics as he seems to be better Follow cultures.  Multiple sclerosis On ocrelizumab-as outpatient At baseline walks with walker PT/OT eval while inpatient  Trigeminal neuralgia Currently asymptomatic Phenytoin/baclofen/Lyrica Unfortunately-developed severe hyponatremia from oxcarbazepine-hence discontinued  CAD No chest pain Stress test February 2024 negative Aspirin/statin  COPD Not in exacerbation Code dilators  Interstitial lung disease Supportive care  BPH Stable Proscar/Uroxatrol  GERD PPI  OSA CPAP nightly  BMI: Estimated body mass index is 24.4 kg/m as calculated from the following:   Height as of this encounter: 6\' 2"  (1.88 m).   Weight as of this encounter: 86.2 kg.   Code status:   Code Status: Full Code   DVT Prophylaxis: enoxaparin (LOVENOX) injection 40 mg Start: 03/09/23 1400   Family Communication: None at bedside   Disposition Plan: Status is: Inpatient Remains inpatient appropriate because: Severity of illness   Planned Discharge Destination:Home   Diet: Diet Order             Diet heart healthy/carb modified Fluid consistency: Thin  Diet effective now                     Antimicrobial agents: Anti-infectives (From admission, onward)    Start     Dose/Rate Route Frequency Ordered Stop   03/09/23 1200  cefTRIAXone (  ROCEPHIN) 2 g in sodium chloride 0.9 % 100 mL IVPB        2 g 200 mL/hr over 30 Minutes Intravenous Daily 03/09/23 0911 03/14/23 0959   03/09/23 1000  azithromycin (ZITHROMAX) 500 mg in sodium chloride 0.9 % 250 mL IVPB        500 mg 250 mL/hr over 60 Minutes Intravenous Daily 03/09/23 0911 03/14/23 0959   03/09/23 0515  ceFEPIme (MAXIPIME) 2 g in sodium chloride 0.9 % 100 mL IVPB        2 g 200 mL/hr over 30 Minutes Intravenous  Once 03/09/23 0501  03/09/23 0629   03/09/23 0515  metroNIDAZOLE (FLAGYL) IVPB 500 mg        500 mg 100 mL/hr over 60 Minutes Intravenous  Once 03/09/23 0501 03/09/23 0823   03/09/23 0515  vancomycin (VANCOCIN) IVPB 1000 mg/200 mL premix  Status:  Discontinued        1,000 mg 200 mL/hr over 60 Minutes Intravenous  Once 03/09/23 0501 03/09/23 0505   03/09/23 0515  vancomycin (VANCOREADY) IVPB 2000 mg/400 mL        2,000 mg 200 mL/hr over 120 Minutes Intravenous  Once 03/09/23 0505 03/09/23 1039        MEDICATIONS: Scheduled Meds:  acidophilus  1 capsule Oral BID   alfuzosin  10 mg Oral QPM   arformoterol  15 mcg Nebulization BID   aspirin EC  81 mg Oral QHS   baclofen  20 mg Oral QID   budesonide (PULMICORT) nebulizer solution  0.25 mg Nebulization BID   dronabinol  5 mg Oral TID   enoxaparin (LOVENOX) injection  40 mg Subcutaneous Q24H   famotidine  40 mg Oral QHS   finasteride  5 mg Oral q AM   fluticasone  1 spray Each Nare Daily   magnesium oxide  400 mg Oral BID   metoCLOPramide  10 mg Oral QPM   montelukast  10 mg Oral QHS   phenytoin  100 mg Oral BID   pregabalin  150 mg Oral BID   And   pregabalin  300 mg Oral QHS   revefenacin  175 mcg Nebulization Daily   Continuous Infusions:  azithromycin 500 mg (03/10/23 0922)   cefTRIAXone (ROCEPHIN)  IV Stopped (03/09/23 1325)   PRN Meds:.acetaminophen, acetaminophen-codeine, albuterol, diclofenac Sodium, loperamide, ondansetron **OR** ondansetron (ZOFRAN) IV, polyvinyl alcohol, senna-docusate, traMADol   I have personally reviewed following labs and imaging studies  LABORATORY DATA: CBC: Recent Labs  Lab 03/09/23 0510 03/10/23 0436  WBC 4.1 3.8*  NEUTROABS 3.5  --   HGB 12.2* 10.9*  HCT 36.6* 32.8*  MCV 84.7 85.0  PLT 322 326    Basic Metabolic Panel: Recent Labs  Lab 03/09/23 0510 03/10/23 0436  NA 131* 134*  K 3.7 3.5  CL 95* 100  CO2 23 25  GLUCOSE 121* 109*  BUN 10 6  CREATININE 0.94 0.80  CALCIUM 8.7* 8.3*     GFR: Estimated Creatinine Clearance: 117 mL/min (by C-G formula based on SCr of 0.8 mg/dL).  Liver Function Tests: Recent Labs  Lab 03/09/23 0510  AST 32  ALT 21  ALKPHOS 73  BILITOT 0.4  PROT 6.7  ALBUMIN 2.9*   No results for input(s): "LIPASE", "AMYLASE" in the last 168 hours. No results for input(s): "AMMONIA" in the last 168 hours.  Coagulation Profile: Recent Labs  Lab 03/09/23 0510  INR 1.2    Cardiac Enzymes: No results for input(s): "CKTOTAL", "CKMB", "CKMBINDEX", "TROPONINI" in  the last 168 hours.  BNP (last 3 results) No results for input(s): "PROBNP" in the last 8760 hours.  Lipid Profile: No results for input(s): "CHOL", "HDL", "LDLCALC", "TRIG", "CHOLHDL", "LDLDIRECT" in the last 72 hours.  Thyroid Function Tests: No results for input(s): "TSH", "T4TOTAL", "FREET4", "T3FREE", "THYROIDAB" in the last 72 hours.  Anemia Panel: No results for input(s): "VITAMINB12", "FOLATE", "FERRITIN", "TIBC", "IRON", "RETICCTPCT" in the last 72 hours.  Urine analysis:    Component Value Date/Time   COLORURINE YELLOW 03/09/2023 0800   APPEARANCEUR CLEAR 03/09/2023 0800   LABSPEC 1.014 03/09/2023 0800   PHURINE 5.0 03/09/2023 0800   GLUCOSEU NEGATIVE 03/09/2023 0800   HGBUR NEGATIVE 03/09/2023 0800   BILIRUBINUR NEGATIVE 03/09/2023 0800   KETONESUR NEGATIVE 03/09/2023 0800   PROTEINUR NEGATIVE 03/09/2023 0800   UROBILINOGEN 0.2 03/01/2014 0258   NITRITE NEGATIVE 03/09/2023 0800   LEUKOCYTESUR NEGATIVE 03/09/2023 0800    Sepsis Labs: Lactic Acid, Venous    Component Value Date/Time   LATICACIDVEN 1.7 03/09/2023 1055    MICROBIOLOGY: Recent Results (from the past 240 hour(s))  Resp panel by RT-PCR (RSV, Flu A&B, Covid) Anterior Nasal Swab     Status: Abnormal   Collection Time: 03/09/23  5:01 AM   Specimen: Anterior Nasal Swab  Result Value Ref Range Status   SARS Coronavirus 2 by RT PCR POSITIVE (A) NEGATIVE Final   Influenza A by PCR NEGATIVE  NEGATIVE Final   Influenza B by PCR NEGATIVE NEGATIVE Final    Comment: (NOTE) The Xpert Xpress SARS-CoV-2/FLU/RSV plus assay is intended as an aid in the diagnosis of influenza from Nasopharyngeal swab specimens and should not be used as a sole basis for treatment. Nasal washings and aspirates are unacceptable for Xpert Xpress SARS-CoV-2/FLU/RSV testing.  Fact Sheet for Patients: BloggerCourse.com  Fact Sheet for Healthcare Providers: SeriousBroker.it  This test is not yet approved or cleared by the [place] FDA and has been authorized for detection and/or diagnosis of SARS-CoV-2 by FDA under an Emergency Use Authorization (EUA). This EUA will remain in effect (meaning this test can be used) for the duration of the COVID-19 declaration under Section 564(b)(1) of the Act, 21 U.S.C. section 360bbb-3(b)(1), unless the authorization is terminated or revoked.     Resp Syncytial Virus by PCR NEGATIVE NEGATIVE Final    Comment: (NOTE) Fact Sheet for Patients: BloggerCourse.com  Fact Sheet for Healthcare Providers: SeriousBroker.it  This test is not yet approved or cleared by the [place] FDA and has been authorized for detection and/or diagnosis of SARS-CoV-2 by FDA under an Emergency Use Authorization (EUA). This EUA will remain in effect (meaning this test can be used) for the duration of the COVID-19 declaration under Section 564(b)(1) of the Act, 21 U.S.C. section 360bbb-3(b)(1), unless the authorization is terminated or revoked.  Performed at [facility], [address] [place], [place] [address]   Blood Culture (routine x 2)     Status: None (Preliminary result)   Collection Time: 03/09/23  5:10 AM   Specimen: BLOOD  Result Value Ref Range Status   Specimen Description BLOOD RIGHT ANTECUBITAL  Final   Special Requests   Final    BOTTLES DRAWN AEROBIC AND  ANAEROBIC Blood Culture results may not be optimal due to an inadequate volume of blood received in culture bottles   Culture   Final    NO GROWTH < 12 HOURS Performed at [facility], [address] [place], [place] [address]    Report  Status PENDING  Incomplete  Blood Culture (routine x 2)     Status: None (Preliminary result)   Collection Time: 03/09/23  5:10 AM   Specimen: BLOOD  Result Value Ref Range Status   Specimen Description BLOOD LEFT ANTECUBITAL  Final   Special Requests   Final    BOTTLES DRAWN AEROBIC AND ANAEROBIC Blood Culture adequate volume   Culture   Final    NO GROWTH < 12 HOURS Performed at [facility], [address], [place], [place] [address]    Report Status PENDING  Incomplete  Respiratory (~20 pathogens) panel by PCR     Status: None   Collection Time: 03/09/23 11:15 AM   Specimen: Nasopharyngeal Swab; Respiratory  Result Value Ref Range Status   Adenovirus NOT DETECTED NOT DETECTED Final   Coronavirus 229E NOT DETECTED NOT DETECTED Final    Comment: (NOTE) The Coronavirus on the Respiratory Panel, DOES NOT test for the novel  Coronavirus (2019 nCoV)    Coronavirus HKU1 NOT DETECTED NOT DETECTED Final   Coronavirus NL63 NOT DETECTED NOT DETECTED Final   Coronavirus OC43 NOT DETECTED NOT DETECTED Final   Metapneumovirus NOT DETECTED NOT DETECTED Final   Rhinovirus / Enterovirus NOT DETECTED NOT DETECTED Final   Influenza A NOT DETECTED NOT DETECTED Final   Influenza B NOT DETECTED NOT DETECTED Final   Parainfluenza Virus 1 NOT DETECTED NOT DETECTED Final   Parainfluenza Virus 2 NOT DETECTED NOT DETECTED Final   Parainfluenza Virus 3 NOT DETECTED NOT DETECTED Final   Parainfluenza Virus 4 NOT DETECTED NOT DETECTED Final   Respiratory Syncytial Virus NOT DETECTED NOT DETECTED Final   Bordetella pertussis NOT DETECTED NOT DETECTED Final   Bordetella Parapertussis NOT DETECTED NOT DETECTED Final   Chlamydophila pneumoniae NOT DETECTED  NOT DETECTED Final   Mycoplasma pneumoniae NOT DETECTED NOT DETECTED Final    Comment: Performed at [facility], [address], [place], [place] [address]    RADIOLOGY STUDIES/RESULTS: CT Angio Chest Pulmonary Embolism (PE) W or WO Contrast  Result Date: 03/09/2023 CLINICAL DATA:  Short of breath, chest pain, hypoxia EXAM: CT ANGIOGRAPHY CHEST WITH CONTRAST TECHNIQUE: Multidetector CT imaging of the chest was performed using the standard protocol during bolus administration of intravenous contrast. Multiplanar CT image reconstructions and MIPs were obtained to evaluate the vascular anatomy. RADIATION DOSE REDUCTION: This exam was performed according to the departmental dose-optimization program which includes automated exposure control, adjustment of the mA and/or kV according to patient size and/or use of iterative reconstruction technique. CONTRAST:  75mL OMNIPAQUE IOHEXOL 350 MG/ML SOLN COMPARISON:  Prior CT scan of the chest 03/03/2021 FINDINGS: Cardiovascular: Satisfactory opacification of the pulmonary arteries to the segmental level. No evidence of pulmonary embolism. Mild cardiomegaly. No pericardial effusion. Mediastinum/Nodes: Unremarkable CT appearance of the thyroid gland. Mildly enlarged mediastinal lymph nodes are highly likely to be reactive in nature. No soft tissue mediastinal mass. Small hiatal hernia. Lungs/Pleura: Background of moderately severe centrilobular emphysema. Multifocal patchy foci of ground-glass attenuation airspace opacity scattered throughout all lobes of both lungs. This is new compared to prior imaging from 2022. Mild dependent atelectasis is present. Diffuse mild bronchial wall thickening. No pleural effusion. A probable intra fissural lymph node along the minor fissure measures up to 8 mm. Upper Abdomen: No acute abnormality. Musculoskeletal: No chest wall abnormality. No acute or significant osseous findings. Review of the MIP images confirms the above  findings. IMPRESSION: 1. Negative for acute pulmonary embolus. 2. Multifocal patchy  foci of ground-glass attenuation airspace opacity scattered throughout all lobes of both lungs. Findings are most consistent with multi lobar pneumonia or atypical/viral respiratory infection. Multi lobar alveolitis or alveolar hemorrhage are less likely possibilities. 3. Background of moderately severe centrilobular pulmonary emphysema. 4. Small hiatal hernia. 5. Mild splenomegaly. 6. Likely reactive mediastinal and fissural lymph nodes. Emphysema (ICD10-J43.9). Electronically Signed   By: Malachy Moan M.D.   On: 03/09/2023 14:47   DG Chest Port 1 View  Result Date: 03/09/2023 CLINICAL DATA:  57 year old male with shortness of breath. Possible sepsis. EXAM: PORTABLE CHEST 1 VIEW COMPARISON:  Chest x-ray 01/25/2021. FINDINGS: Lung volumes are normal. Diffuse interstitial prominence and patchy ill-defined opacities scattered throughout the lungs bilaterally, most evident in the mid to lower lungs. No pleural effusions. No pneumothorax. Heart size is normal. Upper mediastinal contours are within normal limits. IMPRESSION: 1. Abnormal chest x-ray with some findings that are likely related to chronic interstitial lung disease based on comparison with prior chest CT, however, the possibility of superimposed acute multilobar bilateral bronchopneumonia should be considered. Further evaluation with repeat chest CT would provide additional diagnostic information if clinically appropriate. Electronically Signed   By: Trudie Reed M.D.   On: 03/09/2023 05:26     LOS: 1 day   Jeoffrey Massed, MD  [facility]    To contact the attending provider between 7A-7P or the covering provider during after hours 7P-7A, please log into the web site [email or web address] and access using universal [place] password for that web site. If you do not have the password, please call the hospital operator.  03/10/2023, 10:10 AM

## 2023-03-10 NOTE — Plan of Care (Signed)

## 2023-03-10 NOTE — Evaluation (Signed)
Physical Therapy Evaluation Patient Details Name: Randy Greene MRN: [id number] DOB: [birth year] Today's Date: 03/10/2023  History of Present Illness  Pt is 57 yo male admitted on 03/09/23 with multifocal PNE.  Pt with COVID infection earlier this month (still tested +).  Pt with hx including COPD, ILD, MS, trigeminal neuralgia, CAD, OSA  Clinical Impression   Pt admitted with above diagnosis.  At baseline, he is independent and has support at home if needed.  Pt has been mobilizing in his room independently.  He was able to demonstrate safe mobility and balance during therapy.  He does have a drop in O2 sats with activity but is completely asymptomatic (see below).  Educated pt on gradually increasing activity, continuing to mobilize, and use of incentive spirometer.  He verbalized understanding.  Pt does not have further skilled PT needs - pt agrees.  PT will sign off.     Pt was on 1 L O2 with sats 91%.  O2 removed and at rest 90% but dropped to 85% with activity ; however, pt completely asymptomatic.  Recovers in 2 mins on RA.  Left on 1 L at end of session with sats 91%.      If plan is discharge home, recommend the following:     Can travel by private vehicle        Equipment Recommendations None recommended by PT  Recommendations for Other Services       Functional Status Assessment Patient has not had a recent decline in their functional status     Precautions / Restrictions Precautions Precautions: None      Mobility  Bed Mobility Overal bed mobility: Independent                  Transfers Overall transfer level: Independent Equipment used: None               General transfer comment: Pt has been ambulating in room independently.  He demonstrated sit to stand safely    Ambulation/Gait Ambulation/Gait assistance: Supervision Gait Distance (Feet): 100 Feet (100'x2) Assistive device: None Gait Pattern/deviations: WFL(Within Functional Limits) Gait  velocity: normal     General Gait Details: Steady gait without LOB; has been ambulating in room on his own (reports unhooks O2 and telemetry, goes to restroom, comes back and reconnects lines)  Stairs            Wheelchair Mobility     Tilt Bed    Modified Rankin (Stroke Patients Only)       Balance Overall balance assessment: Independent Sitting-balance support: No upper extremity supported Sitting balance-Leahy Scale: Normal     Standing balance support: No upper extremity supported Standing balance-Leahy Scale: Good                               Pertinent Vitals/Pain Pain Assessment Pain Assessment: No/denies pain    Home Living Family/patient expects to be discharged to:: Private residence Living Arrangements: Alone Available Help at Discharge: Family;Available PRN/intermittently;Friend(s) Type of Home: House Home Access: Elevator       Home Layout: One level Home Equipment: Agricultural consultant (2 wheels);Rollator (4 wheels);Shower seat - built in;Grab bars - tub/shower;Grab bars - toilet      Prior Function Prior Level of Function : Independent/Modified Independent;Driving             Mobility Comments: No AD in home, Uses a SPC for community ambulation unless going to [place]  then will take rollator so that he can sit and rest as needed while waiting.  Reports likes to be active - goes to gym and swims ADLs Comments: Independent all adls and basic IADLs (assist with heavy duty cleaning every 2 weeks)     Extremity/Trunk Assessment   Upper Extremity Assessment Upper Extremity Assessment: Overall WFL for tasks assessed    Lower Extremity Assessment Lower Extremity Assessment: LLE deficits/detail;RLE deficits/detail RLE Deficits / Details: ROM WFL; MMT 5/5 LLE Deficits / Details: ROM WFL; MMT 4/5 ankle DF (baseline from MS), knee 5/5, hip 5/5 LLE Sensation: decreased light touch (baseline from MS)    Cervical / Trunk Assessment Cervical  / Trunk Assessment: Normal  Communication      Cognition Arousal: Alert Behavior During Therapy: WFL for tasks assessed/performed Overall Cognitive Status: Within Functional Limits for tasks assessed                                 General Comments: pleasant and motivated        General Comments General comments (skin integrity, edema, etc.):   Pt was on 1 L O2 with sats 91%.  O2 removed and at rest 90% but dropped to 85% with activity ; however, pt completely asymptomatic.  Recovers in 2 mins on RA.  Left on 1 L at end of session with sats 91%.  Pt educated on gradually increasing endurance.  Encouraged continuing incentive spirometer and mobility in room.  Also, discussed could lay prone at times to promote filling of lungs.     Exercises Other Exercises Other Exercises: IS to 1750 mL  - pt reports doing on his own.   Assessment/Plan    PT Assessment Patient does not need any further PT services  PT Problem List         PT Treatment Interventions      PT Goals (Current goals can be found in the Care Plan section)  Acute Rehab PT Goals Patient Stated Goal: return home PT Goal Formulation: All assessment and education complete, DC therapy    Frequency       Co-evaluation               AM-PAC PT "6 Clicks" Mobility  Outcome Measure Help needed turning from your back to your side while in a flat bed without using bedrails?: None Help needed moving from lying on your back to sitting on the side of a flat bed without using bedrails?: None Help needed moving to and from a bed to a chair (including a wheelchair)?: None Help needed standing up from a chair using your arms (e.g., wheelchair or bedside chair)?: None Help needed to walk in hospital room?: None Help needed climbing 3-5 steps with a railing? : None 6 Click Score: 24    End of Session   Activity Tolerance: Patient tolerated treatment well Patient left: in bed;with call bell/phone within  reach Nurse Communication: Mobility status PT Visit Diagnosis: Other abnormalities of gait and mobility (R26.89)    Time: 4098-1191 PT Time Calculation (min) (ACUTE ONLY): 25 min   Charges:   PT Evaluation $PT Eval Low Complexity: 1 Low PT Treatments $Gait Training: 8-22 mins PT General Charges $$ ACUTE PT VISIT: 1 Visit         Anise Salvo, PT Acute Rehab Services [facility] [phone]   Rayetta Humphrey 03/10/2023, 5:46 PM

## 2023-03-11 DIAGNOSIS — J42 Unspecified chronic bronchitis: Secondary | ICD-10-CM | POA: Diagnosis not present

## 2023-03-11 DIAGNOSIS — J189 Pneumonia, unspecified organism: Secondary | ICD-10-CM | POA: Diagnosis not present

## 2023-03-11 DIAGNOSIS — J8489 Other specified interstitial pulmonary diseases: Secondary | ICD-10-CM | POA: Diagnosis not present

## 2023-03-11 DIAGNOSIS — J9601 Acute respiratory failure with hypoxia: Secondary | ICD-10-CM | POA: Diagnosis not present

## 2023-03-11 LAB — COMPREHENSIVE METABOLIC PANEL
ALT: 20 U/L (ref 0–44)
AST: 28 U/L (ref 15–41)
Albumin: 2.4 g/dL — ABNORMAL LOW (ref 3.5–5.0)
Alkaline Phosphatase: 60 U/L (ref 38–126)
Anion gap: 10 (ref 5–15)
BUN: 6 mg/dL (ref 6–20)
CO2: 25 mmol/L (ref 22–32)
Calcium: 8.5 mg/dL — ABNORMAL LOW (ref 8.9–10.3)
Chloride: 99 mmol/L (ref 98–111)
Creatinine, Ser: 0.78 mg/dL (ref 0.61–1.24)
GFR, Estimated: >60 mL/min (ref >60)
Glucose, Bld: 101 mg/dL — ABNORMAL HIGH (ref 70–99)
Potassium: 3.5 mmol/L (ref 3.5–5.1)
Sodium: 134 mmol/L — ABNORMAL LOW (ref 135–145)
Total Bilirubin: 0.3 mg/dL (ref 0.3–1.2)
Total Protein: 5.9 g/dL — ABNORMAL LOW (ref 6.5–8.1)

## 2023-03-11 LAB — CBC WITH DIFFERENTIAL/PLATELET
Abs Immature Granulocytes: 0.04 10*3/uL (ref 0.00–0.07)
Basophils Absolute: 0 10*3/uL (ref 0.0–0.1)
Basophils Relative: 1 %
Eosinophils Absolute: 0 10*3/uL (ref 0.0–0.5)
Eosinophils Relative: 1 %
HCT: 31.6 % — ABNORMAL LOW (ref 39.0–52.0)
Hemoglobin: 10.4 g/dL — ABNORMAL LOW (ref 13.0–17.0)
Immature Granulocytes: 1 %
Lymphocytes Relative: 14 %
Lymphs Abs: 0.6 10*3/uL — ABNORMAL LOW (ref 0.7–4.0)
MCH: 28.1 pg (ref 26.0–34.0)
MCHC: 32.9 g/dL (ref 30.0–36.0)
MCV: 85.4 fL (ref 80.0–100.0)
Monocytes Absolute: 0.5 10*3/uL (ref 0.1–1.0)
Monocytes Relative: 12 %
Neutro Abs: 3.1 10*3/uL (ref 1.7–7.7)
Neutrophils Relative %: 71 %
Platelets: 294 10*3/uL (ref 150–400)
RBC: 3.7 MIL/uL — ABNORMAL LOW (ref 4.22–5.81)
RDW: 15.3 % (ref 11.5–15.5)
WBC: 4.3 10*3/uL (ref 4.0–10.5)
nRBC: 0 % (ref 0.0–0.2)

## 2023-03-11 LAB — MAGNESIUM: Magnesium: 2 mg/dL (ref 1.7–2.4)

## 2023-03-11 LAB — LEGIONELLA PNEUMOPHILA SEROGP 1 UR AG: L. pneumophila Serogp 1 Ur Ag: NEGATIVE

## 2023-03-11 LAB — C-REACTIVE PROTEIN: CRP: 15.4 mg/dL — ABNORMAL HIGH (ref <1.0)

## 2023-03-11 LAB — PROCALCITONIN: Procalcitonin: 0.77 ng/mL

## 2023-03-11 MED ORDER — CALCIUM CARBONATE ANTACID 500 MG PO CHEW
1.0000 | CHEWABLE_TABLET | Freq: Three times a day (TID) | ORAL | Status: DC | PRN
  Administered 2023-03-11 – 2023-03-12 (×3): 200 mg via ORAL
  Filled 2023-03-11 (×3): qty 1

## 2023-03-11 MED ORDER — AZITHROMYCIN 500 MG PO TABS
500.0000 mg | ORAL_TABLET | Freq: Every day | ORAL | Status: DC
  Administered 2023-03-12: 500 mg via ORAL
  Filled 2023-03-11: qty 1

## 2023-03-11 MED ORDER — PREDNISONE 20 MG PO TABS
40.0000 mg | ORAL_TABLET | Freq: Every day | ORAL | Status: DC
  Administered 2023-03-11 – 2023-03-12 (×2): 40 mg via ORAL
  Filled 2023-03-11 (×2): qty 2

## 2023-03-11 NOTE — Plan of Care (Signed)

## 2023-03-11 NOTE — Progress Notes (Signed)
   03/11/23 0947  TOC Brief Assessment  Insurance and Status Reviewed ([facility])  Patient has primary care physician Yes ([facility])  Home environment has been reviewed From Home  Prior level of function: independent  Prior/Current Home Services No current home services  Social Determinants of Health Reivew SDOH reviewed no interventions necessary  Readmission risk has been reviewed Yes (18%)  Transition of care needs no transition of care needs at this time

## 2023-03-11 NOTE — Progress Notes (Signed)
PROGRESS NOTE        PATIENT DETAILS Name: Randy Greene Age: 57 y.o. Sex: male Date of Birth: 1965-08-28 Admit Date: 03/09/2023 Admitting Physician Randy Mustard, MD [facility]  Brief Summary: Patient is a 57 y.o.  male with history of COPD, ILD, multiple sclerosis, trigeminal neuralgia, CAD, OSA on CPAP-who had COVID-19 infection earlier this month-presented to the hospital with several days of worsening shortness of breath/subjective fever-found to have multifocal pneumonia and subsequently admitted to the hospitalist service.  Significant events: 9/17>> admit to [facility]  Significant studies: 9/17>> CTA chest: No PE, multifocal PNA  Significant microbiology data: 9/17>> COVID PCR: Positive 9/17>> influenza/RSV PCR: Negative 9/17>> respiratory virus panel: Negative 9/17>> blood culture: No growth  Procedures: None  Consults: None  Subjective: Afebrile-feels better-desaturates to 86% when oxygen removed.  Needs around 2 L of oxygen to maintain O2 saturations.  Objective: Vitals: Blood pressure 128/78, pulse 85, temperature 98.6 F (37 C), temperature source Oral, resp. rate (!) 22, height 6\' 2"  (1.88 m), weight 86.2 kg, SpO2 95%.   Exam: Gen Exam:Alert awake-not in any distress HEENT:atraumatic, normocephalic Chest: B/L clear to auscultation anteriorly CVS:S1S2 regular Abdomen:soft non tender, non distended Extremities:no edema Neurology: Non focal Skin: no rash  Pertinent Labs/Radiology:    Latest Ref Rng & Units 03/11/2023    7:14 AM 03/10/2023    4:36 AM 03/09/2023    5:10 AM  CBC  WBC 4.0 - 10.5 K/uL 4.3  3.8  4.1   Hemoglobin 13.0 - 17.0 g/dL 40.9  81.1  91.4   Hematocrit 39.0 - 52.0 % 31.6  32.8  36.6   Platelets 150 - 400 K/uL 294  326  322     Lab Results  Component Value Date   NA 134 (L) 03/11/2023   K 3.5 03/11/2023   CL 99 03/11/2023   CO2 25 03/11/2023      Assessment/Plan: Acute hypoxic respiratory  failure secondary to multifocal PNA Unclear whether this is bacterial pneumonia post COVID pneumonitis Procalcitonin mildly elevated-CRP remains significantly elevated  Continues to require around 2 L of oxygen to maintain O2 saturations-O2 saturations dropped down to mid 80s range when placed on room air  Since CRP is persistently elevated-will try a course of steroids (also has a history of pulmonary fibrosis)  Continue IV antibiotics Follow cultures.  May need home O2-he was on home O2 last year-after he had COVID-19 infection then  Multiple sclerosis On ocrelizumab-as outpatient At baseline walks with walker Appreciate PT/OT  Trigeminal neuralgia Currently asymptomatic Phenytoin/baclofen/Lyrica Unfortunately-developed severe hyponatremia from oxcarbazepine-hence discontinued  CAD No chest pain Stress test February 2024 negative Aspirin/statin  COPD Not in exacerbation Continue bronchodilators  Interstitial lung disease CRP persistently elevated-starting short course of steroids PCCM follow-up postdischarge.  BPH Stable Proscar/Uroxatrol  GERD PPI  OSA CPAP nightly  BMI: Estimated body mass index is 24.4 kg/m as calculated from the following:   Height as of this encounter: 6\' 2"  (1.88 m).   Weight as of this encounter: 86.2 kg.   Code status:   Code Status: Full Code   DVT Prophylaxis: enoxaparin (Randy Greene) injection 40 mg Start: 03/09/23 1400   Family Communication: None at bedside   Disposition Plan: Status is: Inpatient Remains inpatient appropriate because: Severity of illness   Planned Discharge Destination:Home   Diet: Diet Order  Diet heart healthy/carb modified Fluid consistency: Thin  Diet effective now                     Antimicrobial agents: Anti-infectives (From admission, onward)    Start     Dose/Rate Route Frequency Ordered Stop   03/09/23 1200  cefTRIAXone (Randy Greene) 2 g in sodium chloride 0.9 % 100 mL  IVPB        2 g 200 mL/hr over 30 Minutes Intravenous Daily 03/09/23 0911 03/14/23 0959   03/09/23 1000  azithromycin (Randy Greene) 500 mg in sodium chloride 0.9 % 250 mL IVPB        500 mg 250 mL/hr over 60 Minutes Intravenous Daily 03/09/23 0911 03/14/23 0959   03/09/23 0515  ceFEPIme (Randy Greene) 2 g in sodium chloride 0.9 % 100 mL IVPB        2 g 200 mL/hr over 30 Minutes Intravenous  Once 03/09/23 0501 03/09/23 0629   03/09/23 0515  metroNIDAZOLE (Randy Greene) IVPB 500 mg        500 mg 100 mL/hr over 60 Minutes Intravenous  Once 03/09/23 0501 03/09/23 0823   03/09/23 0515  vancomycin (Randy Greene) IVPB 1000 mg/200 mL premix  Status:  Discontinued        1,000 mg 200 mL/hr over 60 Minutes Intravenous  Once 03/09/23 0501 03/09/23 0505   03/09/23 0515  vancomycin (Randy Greene) IVPB 2000 mg/400 mL        2,000 mg 200 mL/hr over 120 Minutes Intravenous  Once 03/09/23 0505 03/09/23 1039        MEDICATIONS: Scheduled Meds:  acidophilus  1 capsule Oral BID   alfuzosin  10 mg Oral QPM   arformoterol  15 mcg Nebulization BID   aspirin EC  81 mg Oral QHS   baclofen  20 mg Oral QID   budesonide (PULMICORT) nebulizer solution  0.25 mg Nebulization BID   dronabinol  5 mg Oral TID   enoxaparin (Randy Greene) injection  40 mg Subcutaneous Q24H   famotidine  40 mg Oral QHS   finasteride  5 mg Oral q AM   fluticasone  1 spray Each Nare Daily   magnesium oxide  400 mg Oral BID   metoCLOPramide  10 mg Oral QPM   montelukast  10 mg Oral QHS   phenytoin  100 mg Oral BID   predniSONE  40 mg Oral Q breakfast   pregabalin  150 mg Oral BID   And   pregabalin  300 mg Oral QHS   revefenacin  175 mcg Nebulization Daily   Continuous Infusions:  azithromycin 500 mg (03/11/23 0941)   cefTRIAXone (Randy Greene)  IV 2 g (03/11/23 0829)   PRN Meds:.acetaminophen, acetaminophen-codeine, albuterol, diclofenac Sodium, loperamide, ondansetron **OR** ondansetron (ZOFRAN) IV, polyvinyl alcohol, senna-docusate,  traMADol   I have personally reviewed following labs and imaging studies  LABORATORY DATA: CBC: Recent Labs  Lab 03/09/23 0510 03/10/23 0436 03/11/23 0714  WBC 4.1 3.8* 4.3  NEUTROABS 3.5  --  3.1  HGB 12.2* 10.9* 10.4*  HCT 36.6* 32.8* 31.6*  MCV 84.7 85.0 85.4  PLT 322 326 294    Basic Metabolic Panel: Recent Labs  Lab 03/09/23 0510 03/10/23 0436 03/11/23 0714  NA 131* 134* 134*  K 3.7 3.5 3.5  CL 95* 100 99  CO2 23 25 25   GLUCOSE 121* 109* 101*  BUN 10 6 6   CREATININE 0.94 0.80 0.78  CALCIUM 8.7* 8.3* 8.5*  MG  --   --  2.0  GFR: Estimated Creatinine Clearance: 117 mL/min (by C-G formula based on SCr of 0.78 mg/dL).  Liver Function Tests: Recent Labs  Lab 03/09/23 0510 03/11/23 0714  AST 32 28  ALT 21 20  ALKPHOS 73 60  BILITOT 0.4 0.3  PROT 6.7 5.9*  ALBUMIN 2.9* 2.4*   No results for input(s): "LIPASE", "AMYLASE" in the last 168 hours. No results for input(s): "AMMONIA" in the last 168 hours.  Coagulation Profile: Recent Labs  Lab 03/09/23 0510  INR 1.2    Cardiac Enzymes: No results for input(s): "CKTOTAL", "CKMB", "CKMBINDEX", "TROPONINI" in the last 168 hours.  BNP (last 3 results) No results for input(s): "PROBNP" in the last 8760 hours.  Lipid Profile: No results for input(s): "CHOL", "HDL", "LDLCALC", "TRIG", "CHOLHDL", "LDLDIRECT" in the last 72 hours.  Thyroid Function Tests: No results for input(s): "TSH", "T4TOTAL", "FREET4", "T3FREE", "THYROIDAB" in the last 72 hours.  Anemia Panel: No results for input(s): "VITAMINB12", "FOLATE", "FERRITIN", "TIBC", "IRON", "RETICCTPCT" in the last 72 hours.  Urine analysis:    Component Value Date/Time   COLORURINE YELLOW 03/09/2023 0800   APPEARANCEUR CLEAR 03/09/2023 0800   LABSPEC 1.014 03/09/2023 0800   PHURINE 5.0 03/09/2023 0800   GLUCOSEU NEGATIVE 03/09/2023 0800   HGBUR NEGATIVE 03/09/2023 0800   BILIRUBINUR NEGATIVE 03/09/2023 0800   KETONESUR NEGATIVE 03/09/2023 0800    PROTEINUR NEGATIVE 03/09/2023 0800   UROBILINOGEN 0.2 03/01/2014 0258   NITRITE NEGATIVE 03/09/2023 0800   LEUKOCYTESUR NEGATIVE 03/09/2023 0800    Sepsis Labs: Lactic Acid, Venous    Component Value Date/Time   LATICACIDVEN 1.7 03/09/2023 1055    MICROBIOLOGY: Recent Results (from the past 240 hour(s))  Resp panel by RT-PCR (RSV, Flu A&B, Covid) Anterior Nasal Swab     Status: Abnormal   Collection Time: 03/09/23  5:01 AM   Specimen: Anterior Nasal Swab  Result Value Ref Range Status   SARS Coronavirus 2 by RT PCR POSITIVE (A) NEGATIVE Final   Influenza A by PCR NEGATIVE NEGATIVE Final   Influenza B by PCR NEGATIVE NEGATIVE Final    Comment: (NOTE) The Xpert Xpress SARS-CoV-2/FLU/RSV plus assay is intended as an aid in the diagnosis of influenza from Nasopharyngeal swab specimens and should not be used as a sole basis for treatment. Nasal washings and aspirates are unacceptable for Xpert Xpress SARS-CoV-2/FLU/RSV testing.  Fact Sheet for Patients: BloggerCourse.com  Fact Sheet for Healthcare Providers: SeriousBroker.it  This test is not yet approved or cleared by the [place] FDA and has been authorized for detection and/or diagnosis of SARS-CoV-2 by FDA under an Emergency Use Authorization (EUA). This EUA will remain in effect (meaning this test can be used) for the duration of the COVID-19 declaration under Section 564(b)(1) of the Act, 21 U.S.C. section 360bbb-3(b)(1), unless the authorization is terminated or revoked.     Resp Syncytial Virus by PCR NEGATIVE NEGATIVE Final    Comment: (NOTE) Fact Sheet for Patients: BloggerCourse.com  Fact Sheet for Healthcare Providers: SeriousBroker.it  This test is not yet approved or cleared by the [place] FDA and has been authorized for detection and/or diagnosis of SARS-CoV-2 by FDA under an Emergency Use  Authorization (EUA). This EUA will remain in effect (meaning this test can be used) for the duration of the COVID-19 declaration under Section 564(b)(1) of the Act, 21 U.S.C. section 360bbb-3(b)(1), unless the authorization is terminated or revoked.  Performed at [facility], [address], [place], [place] [address]   Blood Culture (routine x  2)     Status: None (Preliminary result)   Collection Time: 03/09/23  5:10 AM   Specimen: BLOOD  Result Value Ref Range Status   Specimen Description BLOOD RIGHT ANTECUBITAL  Final   Special Requests   Final    BOTTLES DRAWN AEROBIC AND ANAEROBIC Blood Culture results may not be optimal due to an inadequate volume of blood received in culture bottles   Culture   Final    NO GROWTH 2 DAYS Performed at [facility], [address] [place], [place] [address]    Report Status PENDING  Incomplete  Blood Culture (routine x 2)     Status: None (Preliminary result)   Collection Time: 03/09/23  5:10 AM   Specimen: BLOOD  Result Value Ref Range Status   Specimen Description BLOOD LEFT ANTECUBITAL  Final   Special Requests   Final    BOTTLES DRAWN AEROBIC AND ANAEROBIC Blood Culture adequate volume   Culture   Final    NO GROWTH 2 DAYS Performed at [facility], [address], [place], [place] [address]    Report Status PENDING  Incomplete  Respiratory (~20 pathogens) panel by PCR     Status: None   Collection Time: 03/09/23 11:15 AM   Specimen: Nasopharyngeal Swab; Respiratory  Result Value Ref Range Status   Adenovirus NOT DETECTED NOT DETECTED Final   Coronavirus 229E NOT DETECTED NOT DETECTED Final    Comment: (NOTE) The Coronavirus on the Respiratory Panel, DOES NOT test for the novel  Coronavirus (2019 nCoV)    Coronavirus HKU1 NOT DETECTED NOT DETECTED Final   Coronavirus NL63 NOT DETECTED NOT DETECTED Final   Coronavirus OC43 NOT DETECTED NOT DETECTED Final   Metapneumovirus NOT DETECTED NOT DETECTED Final    Rhinovirus / Enterovirus NOT DETECTED NOT DETECTED Final   Influenza A NOT DETECTED NOT DETECTED Final   Influenza B NOT DETECTED NOT DETECTED Final   Parainfluenza Virus 1 NOT DETECTED NOT DETECTED Final   Parainfluenza Virus 2 NOT DETECTED NOT DETECTED Final   Parainfluenza Virus 3 NOT DETECTED NOT DETECTED Final   Parainfluenza Virus 4 NOT DETECTED NOT DETECTED Final   Respiratory Syncytial Virus NOT DETECTED NOT DETECTED Final   Bordetella pertussis NOT DETECTED NOT DETECTED Final   Bordetella Parapertussis NOT DETECTED NOT DETECTED Final   Chlamydophila pneumoniae NOT DETECTED NOT DETECTED Final   Mycoplasma pneumoniae NOT DETECTED NOT DETECTED Final    Comment: Performed at [facility], [address], [place], [place] [address]    RADIOLOGY STUDIES/RESULTS: CT Angio Chest Pulmonary Embolism (PE) W or WO Contrast  Result Date: 03/09/2023 CLINICAL DATA:  Short of breath, chest pain, hypoxia EXAM: CT ANGIOGRAPHY CHEST WITH CONTRAST TECHNIQUE: Multidetector CT imaging of the chest was performed using the standard protocol during bolus administration of intravenous contrast. Multiplanar CT image reconstructions and MIPs were obtained to evaluate the vascular anatomy. RADIATION DOSE REDUCTION: This exam was performed according to the departmental dose-optimization program which includes automated exposure control, adjustment of the mA and/or kV according to patient size and/or use of iterative reconstruction technique. CONTRAST:  75mL OMNIPAQUE IOHEXOL 350 MG/ML SOLN COMPARISON:  Prior CT scan of the chest 03/03/2021 FINDINGS: Cardiovascular: Satisfactory opacification of the pulmonary arteries to the segmental level. No evidence of pulmonary embolism. Mild cardiomegaly. No pericardial effusion. Mediastinum/Nodes: Unremarkable CT appearance of the thyroid gland. Mildly enlarged mediastinal lymph nodes are highly likely to be reactive in nature. No soft tissue mediastinal mass.  Small hiatal  hernia. Lungs/Pleura: Background of moderately severe centrilobular emphysema. Multifocal patchy foci of ground-glass attenuation airspace opacity scattered throughout all lobes of both lungs. This is new compared to prior imaging from 2022. Mild dependent atelectasis is present. Diffuse mild bronchial wall thickening. No pleural effusion. A probable intra fissural lymph node along the minor fissure measures up to 8 mm. Upper Abdomen: No acute abnormality. Musculoskeletal: No chest wall abnormality. No acute or significant osseous findings. Review of the MIP images confirms the above findings. IMPRESSION: 1. Negative for acute pulmonary embolus. 2. Multifocal patchy foci of ground-glass attenuation airspace opacity scattered throughout all lobes of both lungs. Findings are most consistent with multi lobar pneumonia or atypical/viral respiratory infection. Multi lobar alveolitis or alveolar hemorrhage are less likely possibilities. 3. Background of moderately severe centrilobular pulmonary emphysema. 4. Small hiatal hernia. 5. Mild splenomegaly. 6. Likely reactive mediastinal and fissural lymph nodes. Emphysema (ICD10-J43.9). Electronically Signed   By: Malachy Moan M.D.   On: 03/09/2023 14:47     LOS: 2 days   Jeoffrey Massed, MD  [facility]    To contact the attending provider between 7A-7P or the covering provider during after hours 7P-7A, please log into the web site [email or web address] and access using universal [place] password for that web site. If you do not have the password, please call the hospital operator.  03/11/2023, 9:46 AM

## 2023-03-11 NOTE — Progress Notes (Signed)
OT Screen Note  Patient Details Name: Randy Greene MRN: [id number] DOB: 07-31-1965   Cancelled Treatment:    Reason Eval/Treat Not Completed: OT screened, no needs identified, will sign off (Pt denies any deficits with ADL performance, educated pt on rest breaks and pacing himself as needed. Pt verbalized understanding. OT signing off, reconsult OT if functional status changes.)  03/11/2023  AB, OTR/L  Acute Rehabilitation Services  Office: [phone]   Tristan Schroeder 03/11/2023, 10:19 AM

## 2023-03-12 ENCOUNTER — Other Ambulatory Visit (HOSPITAL_COMMUNITY): Payer: Self-pay

## 2023-03-12 DIAGNOSIS — G35 Multiple sclerosis: Secondary | ICD-10-CM | POA: Diagnosis not present

## 2023-03-12 DIAGNOSIS — J189 Pneumonia, unspecified organism: Secondary | ICD-10-CM | POA: Diagnosis not present

## 2023-03-12 DIAGNOSIS — J8489 Other specified interstitial pulmonary diseases: Secondary | ICD-10-CM | POA: Diagnosis not present

## 2023-03-12 DIAGNOSIS — J9601 Acute respiratory failure with hypoxia: Secondary | ICD-10-CM | POA: Diagnosis not present

## 2023-03-12 LAB — CBC
HCT: 33.8 % — ABNORMAL LOW (ref 39.0–52.0)
Hemoglobin: 11 g/dL — ABNORMAL LOW (ref 13.0–17.0)
MCH: 27.1 pg (ref 26.0–34.0)
MCHC: 32.5 g/dL (ref 30.0–36.0)
MCV: 83.3 fL (ref 80.0–100.0)
Platelets: 340 10*3/uL (ref 150–400)
RBC: 4.06 MIL/uL — ABNORMAL LOW (ref 4.22–5.81)
RDW: 15.1 % (ref 11.5–15.5)
WBC: 5 10*3/uL (ref 4.0–10.5)
nRBC: 0 % (ref 0.0–0.2)

## 2023-03-12 LAB — BASIC METABOLIC PANEL
Anion gap: 8 (ref 5–15)
BUN: 5 mg/dL — ABNORMAL LOW (ref 6–20)
CO2: 23 mmol/L (ref 22–32)
Calcium: 8.5 mg/dL — ABNORMAL LOW (ref 8.9–10.3)
Chloride: 101 mmol/L (ref 98–111)
Creatinine, Ser: 0.8 mg/dL (ref 0.61–1.24)
GFR, Estimated: >60 mL/min (ref >60)
Glucose, Bld: 120 mg/dL — ABNORMAL HIGH (ref 70–99)
Potassium: 3.2 mmol/L — ABNORMAL LOW (ref 3.5–5.1)
Sodium: 132 mmol/L — ABNORMAL LOW (ref 135–145)

## 2023-03-12 LAB — C-REACTIVE PROTEIN: CRP: 12.6 mg/dL — ABNORMAL HIGH (ref <1.0)

## 2023-03-12 MED ORDER — CEFADROXIL 500 MG PO CAPS
1.0000 g | ORAL_CAPSULE | Freq: Two times a day (BID) | ORAL | 0 refills | Status: AC
  Filled 2023-03-12: qty 16, 4d supply, fill #0

## 2023-03-12 MED ORDER — POTASSIUM CHLORIDE CRYS ER 20 MEQ PO TBCR
40.0000 meq | EXTENDED_RELEASE_TABLET | Freq: Once | ORAL | Status: AC
  Administered 2023-03-12: 40 meq via ORAL
  Filled 2023-03-12: qty 2

## 2023-03-12 MED ORDER — PREDNISONE 10 MG PO TABS
ORAL_TABLET | ORAL | 0 refills | Status: DC
  Filled 2023-03-12: qty 26, 9d supply, fill #0

## 2023-03-12 NOTE — Plan of Care (Signed)

## 2023-03-12 NOTE — TOC Transition Note (Signed)
Transition of Care [facility]) - CM/SW Discharge Note   Patient Details  Name: Randy Greene MRN: [id number] Date of Birth: 1965-06-28  Transition of Care [facility]) CM/SW Contact:  Gordy Clement, RN Phone Number: 03/12/2023, 8:49 AM   Clinical Narrative:     Patient will DC to home today  Home O2 has been ordered and [employer] will deliver portable to bedside.  Walk test pending at this time   No home health has been recommended. Family to transport.  No additional TOC needs          Patient Goals and CMS Choice      Discharge Placement                         Discharge Plan and Services Additional resources added to the After Visit Summary for                                       Social Determinants of Health (SDOH) Interventions SDOH Screenings   Food Insecurity: No Food Insecurity (03/09/2023)  Housing: Low Risk  (03/09/2023)  Transportation Needs: No Transportation Needs (03/09/2023)  Utilities: Not At Risk (03/09/2023)  Depression (PHQ2-9): Low Risk  (01/08/2023)  Social Connections: Unknown (02/22/2023)   Received from [facility]  Tobacco Use: Medium Risk (03/09/2023)     Readmission Risk Interventions    01/16/2021    4:52 PM  Readmission Risk Prevention Plan  Transportation Screening Complete  PCP or Specialist Appt within 3-5 Days Complete  Social Work Consult for Recovery Care Planning/Counseling Complete  Palliative Care Screening Not Applicable  Medication Review Oceanographer) Complete

## 2023-03-12 NOTE — Discharge Summary (Signed)
PATIENT DETAILS Name: Randy Greene Age: 57 y.o. Sex: male Date of Birth: 1966/01/18 MRN: [id number]. Admitting Physician: Orland Mustard, MD [id number]  Admit Date: 03/09/2023 Discharge date: 03/12/2023  Recommendations for Outpatient Follow-up:  Follow up with PCP in 1-2 weeks Please obtain CMP/CBC in one week Please ensure follow-up with pulmonology at the [facility] Being discharged on home O2-patient's primary care practitioner/pulmonology to reassess in the next weeks if patient still requires oxygen.  Repeat two-view chest x-ray or CT imaging in 4 to 6 weeks to see if these infiltrates have improved/resolved.  Admitted From:  Home  Disposition: Home   Discharge Condition: good  CODE STATUS:   Code Status: Full Code   Diet recommendation:  Diet Order             Diet - low sodium heart healthy           Diet heart healthy/carb modified Fluid consistency: Thin  Diet effective now                    Brief Summary: Patient is a 57 y.o.  male with history of COPD, ILD, multiple sclerosis, trigeminal neuralgia, CAD, OSA on CPAP-who had COVID-19 infection earlier this month-presented to the hospital with several days of worsening shortness of breath/subjective fever-found to have multifocal pneumonia and subsequently admitted to the hospitalist service.   Significant events: 9/17>> admit to [facility]   Significant studies: 9/17>> CTA chest: No PE, multifocal PNA   Significant microbiology data: 9/17>> COVID PCR: Positive 9/17>> influenza/RSV PCR: Negative 9/17>> respiratory virus panel: Negative 9/17>> blood culture: No growth   Procedures: None   Consults: None    Brief Hospital Course: Acute hypoxic respiratory failure secondary to multifocal PNA Unclear whether this is bacterial pneumonia post COVID pneumonitis Procalcitonin was mildly elevated-CRP was significantly elevated  He was placed on empiric IV antibiotics-and also started  on steroids  Required around 2 L of oxygen to maintain O2 saturations-by day of discharge-at rest was mostly on room air-but felt to require oxygen with ambulation.   He feels clinically better and is actually requesting discharge home-he was on oxygen for several weeks last year when he had COVID-19 infection as well. Plan is to transition to oral antibiotics-continue prednisone for the next 7-10 days and have him follow-up with his outpatient pulmonologist.   He will require repeat chest imaging at the discretion of his pulmonologist in 4 to 6 weeks to reassess clearing of his lung parenchyma, and also will need to be reassessed whether he still requires home O2 during that visit.   Multiple sclerosis On ocrelizumab-as outpatient At baseline walks with walker Appreciate PT/OT Resume follow-up with neurology at the [facility]   Trigeminal neuralgia Currently asymptomatic Phenytoin/baclofen/Lyrica Unfortunately-developed severe hyponatremia from oxcarbazepine-hence discontinued Resume patient neurology follow-up   CAD No chest pain Stress test February 2024 negative Aspirin/statin   COPD Not in exacerbation Continue bronchodilators   Interstitial lung disease CRP persistently elevated-starting short course of steroids PCCM follow-up at the [facility] postdischarge.  Have asked patient to see if he can get an appointment with his pulmonologist in the next 1-2 weeks.   BPH Stable Proscar/Uroxatrol   GERD PPI   OSA CPAP nightly   BMI: Estimated body mass index is 24.4 kg/m as calculated from the following:   Height as of this encounter: 6\' 2"  (1.88 m).   Weight as of this encounter: 86.2 kg.    Discharge Diagnoses:  Principal  Problem:   Acute respiratory failure with hypoxia (HCC) Active Problems:   SIRS (systemic inflammatory response syndrome) (HCC)   Pneumonia   MS (multiple sclerosis) (HCC)   COPD (chronic obstructive pulmonary disease) (HCC)   CAD (coronary artery  disease)   Acid reflux   NSIP (nonspecific interstitial pneumonitis) (HCC)   BPH (benign prostatic hyperplasia)   OSA on CPAP   Trigeminal neuralgia   Discharge Instructions:  Activity:  As tolerated   Discharge Instructions     Call MD for:  difficulty breathing, headache or visual disturbances   Complete by: As directed    Call MD for:  extreme fatigue   Complete by: As directed    Diet - low sodium heart healthy   Complete by: As directed    Discharge instructions   Complete by: As directed    Follow with [facility] in 1-2 weeks  Please ask your primary care practitioner or your primary pulmonologist to repeat either a chest x-ray or CT chest in 4 to 6 weeks to reassess whether your lung parenchyma has cleared the infection or not.  Continue antibiotics/tapering steroids as prescribed  Please ask your primary care practitioner/primary pulmonologist to reassess whether you actually require oxygen at next visit.  Please get a complete blood count and chemistry panel checked by your Primary MD at your next visit, and again as instructed by your Primary MD.  Get Medicines reviewed and adjusted: Please take all your medications with you for your next visit with your Primary MD  Laboratory/radiological data: Please request your Primary MD to go over all hospital tests and procedure/radiological results at the follow up, please ask your Primary MD to get all Hospital records sent to his/her office.  In some cases, they will be blood work, cultures and biopsy results pending at the time of your discharge. Please request that your primary care M.D. follows up on these results.  Also Note the following: If you experience worsening of your admission symptoms, develop shortness of breath, life threatening emergency, suicidal or homicidal thoughts you must seek medical attention immediately by calling 911 or calling your MD immediately  if symptoms less  severe.  You must read complete instructions/literature along with all the possible adverse reactions/side effects for all the Medicines you take and that have been prescribed to you. Take any new Medicines after you have completely understood and accpet all the possible adverse reactions/side effects.   Do not drive when taking Pain medications or sleeping medications (Benzodaizepines)  Do not take more than prescribed Pain, Sleep and Anxiety Medications. It is not advisable to combine anxiety,sleep and pain medications without talking with your primary care practitioner  Special Instructions: If you have smoked or chewed Tobacco  in the last 2 yrs please stop smoking, stop any regular Alcohol  and or any Recreational drug use.  Wear Seat belts while driving.  Please note: You were cared for by a hospitalist during your hospital stay. Once you are discharged, your primary care physician will handle any further medical issues. Please note that NO REFILLS for any discharge medications will be authorized once you are discharged, as it is imperative that you return to your primary care physician (or establish a relationship with a primary care physician if you do not have one) for your post hospital discharge needs so that they can reassess your need for medications and monitor your lab values.   Increase activity slowly   Complete by: As  directed       Allergies as of 03/12/2023       Reactions   Penicillins Hives, Other (See Comments), Rash   "My face got flush"   Tape Rash   Zoloft [sertraline] Rash   Food Nausea And Vomiting, Other (See Comments)   GREEN PEPPERS- flushed PT DOES NOT EAT MEAT   Morphine Itching   Mushroom Extract Complex Nausea And Vomiting, Other (See Comments)   And flushed Other reaction(s): Other (See Comments) And flushed   Penicillin G Rash   Pravastatin Other (See Comments)        Medication List     TAKE these medications    acetaminophen-codeine  300-30 MG tablet Commonly known as: TYLENOL #3 Take 1 tablet by mouth 2 (two) times daily as needed for moderate pain.   albuterol 108 (90 Base) MCG/ACT inhaler Commonly known as: VENTOLIN HFA Inhale 2 puffs into the lungs every 6 (six) hours as needed for wheezing or shortness of breath.   alfuzosin 10 MG 24 hr tablet Commonly known as: UROXATRAL Take 10 mg by mouth every evening.   Align 4 MG Caps Take 4 mg by mouth in the morning and at bedtime.   amphetamine-dextroamphetamine 30 MG 24 hr capsule Commonly known as: ADDERALL XR Take 30 mg by mouth 2 (two) times daily.   ascorbic acid 500 MG tablet Commonly known as: VITAMIN C Take 500 mg by mouth See admin instructions. Take 1 tablet by mouth every 2 days   aspirin EC 81 MG tablet Take by mouth.   baclofen 20 MG tablet Commonly known as: LIORESAL Take 20 mg by mouth 4 (four) times daily.   calcium carbonate 1250 (500 Ca) MG chewable tablet Commonly known as: OS-CAL Chew 1 tablet by mouth 2 (two) times daily.   carboxymethylcellulose 1 % ophthalmic solution Place 1 drop into both eyes as needed (dry eyes).   cefadroxil 1 g tablet Commonly known as: DURICEF Take 1 tablet (1 g total) by mouth 2 (two) times daily for 4 days.   cyanocobalamin 1000 MCG/ML injection Commonly known as: VITAMIN B12 Inject 1,000 mcg into the muscle every 28 (twenty-eight) days.   cyclobenzaprine 10 MG tablet Commonly known as: FLEXERIL Take 10 mg by mouth daily as needed (severe muscle spasms).   diclofenac Sodium 1 % Gel Commonly known as: VOLTAREN Apply 2 g topically 2 (two) times daily as needed (pain).   dronabinol 2.5 MG capsule Commonly known as: MARINOL Take 5 mg by mouth 3 (three) times daily.   esomeprazole 40 MG capsule Commonly known as: NEXIUM Take 40 mg by mouth 2 (two) times daily before a meal.   famotidine 40 MG tablet Commonly known as: PEPCID Take 40 mg by mouth at bedtime.   finasteride 5 MG tablet Commonly  known as: PROSCAR Take 5 mg by mouth in the morning.   Fish Oil 1000 MG Caps Take 1,000 mg by mouth in the morning, at noon, and at bedtime.   fluorouracil 5 % cream Commonly known as: EFUDEX Apply 1 application  topically daily as needed (brown spots on forehead).   fluticasone 50 MCG/ACT nasal spray Commonly known as: FLONASE Place 1 spray into both nostrils daily.   ketotifen 0.025 % ophthalmic solution Commonly known as: ZADITOR Place 1 drop into both eyes 2 (two) times daily as needed (itching).   LACTOBACILLUS PO Take 1 capsule by mouth every morning.   magnesium 84 MG ( ) Tbcr SR tablet Commonly known as: MAGTAB  Take 42 mg by mouth 2 (two) times daily.   meloxicam 15 MG tablet Commonly known as: MOBIC Take 15 mg by mouth daily. For knee pain   metoCLOPramide 10 MG tablet Commonly known as: REGLAN Take 10 mg by mouth every evening.   Mometasone Furoate 100 MCG/ACT Aero Take 1 spray by mouth every morning.   montelukast 10 MG tablet Commonly known as: SINGULAIR Take 10 mg by mouth at bedtime.   naloxone 4 MG/0.1ML Liqd nasal spray kit Commonly known as: NARCAN Place 1 spray into the nose See admin instructions. SPRAY 1 SPRAY INTO ONE NOSTRIL AS DIRECTED FOR OPIOID OVERDOSE (TURN PERSON ON SIDE AFTER DOSE. IF NO RESPONSE IN 2-3 MINUTES OR PERSON RESPONDS BUT RELAPSES, REPEAT USING A NEW SPRAY DEVICE AND SPRAY INTO THE OTHER NOSTRIL. CALL 911 AFTER USE.) * EMERGENCY USE ONLY *   Ofatumumab 20 MG/0.4ML Soaj Inject 20 mg into the skin every 30 (thirty) days. Kesimpta   ondansetron 4 MG tablet Commonly known as: Zofran Take 1 tablet (4 mg total) by mouth every 8 (eight) hours as needed for nausea or vomiting.   phenytoin 100 MG ER capsule Commonly known as: DILANTIN Take 100 mg by mouth 2 (two) times daily.   predniSONE 10 MG tablet Commonly known as: DELTASONE Take 40 mg daily for 3 days, 30 mg daily for 3 days, 20 mg daily for 2 days,10 mg daily for 1  days, then stop   pregabalin 150 MG capsule Commonly known as: LYRICA Take 150-300 mg by mouth See admin instructions. Take 1 capsule by mouth twice a day, then take 2 capsules in the evening   salicyclic acid-sulfur 2-2 % shampoo Commonly known as: SEBULEX Apply 1 application  topically daily as needed for itching.   Spiriva Respimat 2.5 MCG/ACT Aers Generic drug: Tiotropium Bromide Monohydrate Inhale 1 each into the lungs daily.   traMADol 50 MG tablet Commonly known as: ULTRAM Take 50 mg by mouth daily as needed for moderate pain.   urea 20 % cream Commonly known as: CARMOL Apply 1 application  topically daily as needed (for bottom of feet).   Vitamin B Complex w/B-12 Tabs VITAMIN B COMPLEX W/B-12 TAB                                      Non-[facility]                                                                      TAKE ONE TABLET BY MOUTH DAILY  Sep 04, 2008                                        [facility]  Documented by:  ST MARTIN,CHARLES D                                    Documented at:  [facility]   Vitamin D 50 MCG (2000 UT) tablet Take 6,000 Units by mouth daily.   vitamin E 180 MG (400 UNITS) capsule Take 400 Units by mouth daily.   zinc sulfate 220 (50 Zn) MG capsule Take 220 mg by mouth every other day.               Durable Medical Equipment  (From admission, onward)           Start     Ordered   03/11/23 1253  For home use only DME oxygen  Once       Question Answer Comment  Length of Need 6 Months   Mode or (Route) Nasal cannula   Liters per Minute 2   Frequency Continuous (stationary and portable oxygen unit needed)   Oxygen delivery system Gas      03/11/23 1252            Follow-up Information     [facility] Schedule an appointment as soon as possible for a visit in 1 week(s).   Specialty: General  Practice Contact information: [address] [place] [address] [phone]                Allergies  Allergen Reactions   Penicillins Hives, Other (See Comments) and Rash    "My face got flush"   Tape Rash   Zoloft [Sertraline] Rash   Food Nausea And Vomiting and Other (See Comments)    GREEN PEPPERS- flushed  PT DOES NOT EAT MEAT   Morphine Itching   Mushroom Extract Complex Nausea And Vomiting and Other (See Comments)    And flushed Other reaction(s): Other (See Comments) And flushed   Penicillin G Rash   Pravastatin Other (See Comments)     Other Procedures/Studies: CT Angio Chest Pulmonary Embolism (PE) W or WO Contrast  Result Date: 03/09/2023 CLINICAL DATA:  Short of breath, chest pain, hypoxia EXAM: CT ANGIOGRAPHY CHEST WITH CONTRAST TECHNIQUE: Multidetector CT imaging of the chest was performed using the standard protocol during bolus administration of intravenous contrast. Multiplanar CT image reconstructions and MIPs were obtained to evaluate the vascular anatomy. RADIATION DOSE REDUCTION: This exam was performed according to the departmental dose-optimization program which includes automated exposure control, adjustment of the mA and/or kV according to patient size and/or use of iterative reconstruction technique. CONTRAST:  75mL OMNIPAQUE IOHEXOL 350 MG/ML SOLN COMPARISON:  Prior CT scan of the chest 03/03/2021 FINDINGS: Cardiovascular: Satisfactory opacification of the pulmonary arteries to the segmental level. No evidence of pulmonary embolism. Mild cardiomegaly. No pericardial effusion. Mediastinum/Nodes: Unremarkable CT appearance of the thyroid gland. Mildly enlarged mediastinal lymph nodes are highly likely to be reactive in nature. No soft tissue mediastinal mass. Small hiatal hernia. Lungs/Pleura: Background of moderately severe centrilobular emphysema. Multifocal patchy foci of ground-glass attenuation airspace opacity scattered throughout all lobes  of both lungs. This is new compared to prior imaging from 2022. Mild dependent atelectasis is present. Diffuse mild bronchial wall thickening. No pleural effusion. A probable intra fissural lymph node along the minor fissure measures up to 8 mm. Upper Abdomen: No acute abnormality. Musculoskeletal: No chest wall abnormality. No acute or significant osseous findings. Review of the MIP images confirms the above  findings. IMPRESSION: 1. Negative for acute pulmonary embolus. 2. Multifocal patchy foci of ground-glass attenuation airspace opacity scattered throughout all lobes of both lungs. Findings are most consistent with multi lobar pneumonia or atypical/viral respiratory infection. Multi lobar alveolitis or alveolar hemorrhage are less likely possibilities. 3. Background of moderately severe centrilobular pulmonary emphysema. 4. Small hiatal hernia. 5. Mild splenomegaly. 6. Likely reactive mediastinal and fissural lymph nodes. Emphysema (ICD10-J43.9). Electronically Signed   By: Malachy Moan M.D.   On: 03/09/2023 14:47   DG Chest Port 1 View  Result Date: 03/09/2023 CLINICAL DATA:  57 year old male with shortness of breath. Possible sepsis. EXAM: PORTABLE CHEST 1 VIEW COMPARISON:  Chest x-ray 01/25/2021. FINDINGS: Lung volumes are normal. Diffuse interstitial prominence and patchy ill-defined opacities scattered throughout the lungs bilaterally, most evident in the mid to lower lungs. No pleural effusions. No pneumothorax. Heart size is normal. Upper mediastinal contours are within normal limits. IMPRESSION: 1. Abnormal chest x-ray with some findings that are likely related to chronic interstitial lung disease based on comparison with prior chest CT, however, the possibility of superimposed acute multilobar bilateral bronchopneumonia should be considered. Further evaluation with repeat chest CT would provide additional diagnostic information if clinically appropriate. Electronically Signed   By: Trudie Reed M.D.   On: 03/09/2023 05:26     TODAY-DAY OF DISCHARGE:  Subjective:   Randy Greene today has no headache,no chest abdominal pain,no new weakness tingling or numbness, feels much better wants to go home today.   Objective:   Blood pressure (!) 153/91, pulse 86, temperature 98.4 F (36.9 C), temperature source Oral, resp. rate 17, height 6\' 2"  (1.88 m), weight 86.2 kg, SpO2 94%. No intake or output data in the 24 hours ending 03/12/23 0840 Filed Weights   03/09/23 0452 03/09/23 1132  Weight: 86.2 kg 86.2 kg    Exam: Awake Alert, Oriented *3, No new F.N deficits, Normal affect [place].AT,PERRAL Supple Neck,No JVD, No cervical lymphadenopathy appriciated.  Symmetrical Chest wall movement, Good air movement bilaterally, CTAB RRR,No Gallops,Rubs or new Murmurs, No Parasternal Heave +ve B.Sounds, Abd Soft, Non tender, No organomegaly appriciated, No rebound -guarding or rigidity. No Cyanosis, Clubbing or edema, No new Rash or bruise   PERTINENT RADIOLOGIC STUDIES: No results found.   PERTINENT LAB RESULTS: CBC: Recent Labs    03/11/23 0714 03/12/23 0730  WBC 4.3 5.0  HGB 10.4* 11.0*  HCT 31.6* 33.8*  PLT 294 340   CMET CMP     Component Value Date/Time   NA 134 (L) 03/11/2023 0714   K 3.5 03/11/2023 0714   CL 99 03/11/2023 0714   CO2 25 03/11/2023 0714   GLUCOSE 101 (H) 03/11/2023 0714   BUN 6 03/11/2023 0714   CREATININE 0.78 03/11/2023 0714   CALCIUM 8.5 (L) 03/11/2023 0714   PROT 5.9 (L) 03/11/2023 0714   ALBUMIN 2.4 (L) 03/11/2023 0714   AST 28 03/11/2023 0714   ALT 20 03/11/2023 0714   ALKPHOS 60 03/11/2023 0714   BILITOT 0.3 03/11/2023 0714   GFR 97.90 10/22/2020 1013   GFRNONAA >60 03/11/2023 0714    GFR Estimated Creatinine Clearance: 117 mL/min (by C-G formula based on SCr of 0.78 mg/dL). No results for input(s): "LIPASE", "AMYLASE" in the last 72 hours. No results for input(s): "CKTOTAL", "CKMB", "CKMBINDEX", "TROPONINI" in the last 72  hours. Invalid input(s): "POCBNP" No results for input(s): "DDIMER" in the last 72 hours. No results for input(s): "HGBA1C" in the last 72 hours. No results for input(s): "CHOL", "  HDL", "LDLCALC", "TRIG", "CHOLHDL", "LDLDIRECT" in the last 72 hours. No results for input(s): "TSH", "T4TOTAL", "T3FREE", "THYROIDAB" in the last 72 hours.  Invalid input(s): "FREET3" No results for input(s): "VITAMINB12", "FOLATE", "FERRITIN", "TIBC", "IRON", "RETICCTPCT" in the last 72 hours. Coags: No results for input(s): "INR" in the last 72 hours.  Invalid input(s): "PT" Microbiology: Recent Results (from the past 240 hour(s))  Resp panel by RT-PCR (RSV, Flu A&B, Covid) Anterior Nasal Swab     Status: Abnormal   Collection Time: 03/09/23  5:01 AM   Specimen: Anterior Nasal Swab  Result Value Ref Range Status   SARS Coronavirus 2 by RT PCR POSITIVE (A) NEGATIVE Final   Influenza A by PCR NEGATIVE NEGATIVE Final   Influenza B by PCR NEGATIVE NEGATIVE Final    Comment: (NOTE) The Xpert Xpress SARS-CoV-2/FLU/RSV plus assay is intended as an aid in the diagnosis of influenza from Nasopharyngeal swab specimens and should not be used as a sole basis for treatment. Nasal washings and aspirates are unacceptable for Xpert Xpress SARS-CoV-2/FLU/RSV testing.  Fact Sheet for Patients: BloggerCourse.com  Fact Sheet for Healthcare Providers: SeriousBroker.it  This test is not yet approved or cleared by the [place] FDA and has been authorized for detection and/or diagnosis of SARS-CoV-2 by FDA under an Emergency Use Authorization (EUA). This EUA will remain in effect (meaning this test can be used) for the duration of the COVID-19 declaration under Section 564(b)(1) of the Act, 21 U.S.C. section 360bbb-3(b)(1), unless the authorization is terminated or revoked.     Resp Syncytial Virus by PCR NEGATIVE NEGATIVE Final    Comment: (NOTE) Fact Sheet for  Patients: BloggerCourse.com  Fact Sheet for Healthcare Providers: SeriousBroker.it  This test is not yet approved or cleared by the [place] FDA and has been authorized for detection and/or diagnosis of SARS-CoV-2 by FDA under an Emergency Use Authorization (EUA). This EUA will remain in effect (meaning this test can be used) for the duration of the COVID-19 declaration under Section 564(b)(1) of the Act, 21 U.S.C. section 360bbb-3(b)(1), unless the authorization is terminated or revoked.  Performed at [facility], [address] [place], [place] [address]   Blood Culture (routine x 2)     Status: None (Preliminary result)   Collection Time: 03/09/23  5:10 AM   Specimen: BLOOD  Result Value Ref Range Status   Specimen Description BLOOD RIGHT ANTECUBITAL  Final   Special Requests   Final    BOTTLES DRAWN AEROBIC AND ANAEROBIC Blood Culture results may not be optimal due to an inadequate volume of blood received in culture bottles   Culture   Final    NO GROWTH 2 DAYS Performed at [facility], [address] [place], [place] [address]    Report Status PENDING  Incomplete  Blood Culture (routine x 2)     Status: None (Preliminary result)   Collection Time: 03/09/23  5:10 AM   Specimen: BLOOD  Result Value Ref Range Status   Specimen Description BLOOD LEFT ANTECUBITAL  Final   Special Requests   Final    BOTTLES DRAWN AEROBIC AND ANAEROBIC Blood Culture adequate volume   Culture   Final    NO GROWTH 2 DAYS Performed at [facility], [address] [place], [place] [address]    Report Status PENDING  Incomplete  Respiratory (~20 pathogens) panel by PCR     Status: None   Collection Time: 03/09/23 11:15 AM   Specimen: Nasopharyngeal  Swab; Respiratory  Result Value Ref Range Status   Adenovirus NOT DETECTED NOT DETECTED Final   Coronavirus 229E NOT DETECTED NOT DETECTED Final    Comment: (NOTE) The  Coronavirus on the Respiratory Panel, DOES NOT test for the novel  Coronavirus (2019 nCoV)    Coronavirus HKU1 NOT DETECTED NOT DETECTED Final   Coronavirus NL63 NOT DETECTED NOT DETECTED Final   Coronavirus OC43 NOT DETECTED NOT DETECTED Final   Metapneumovirus NOT DETECTED NOT DETECTED Final   Rhinovirus / Enterovirus NOT DETECTED NOT DETECTED Final   Influenza A NOT DETECTED NOT DETECTED Final   Influenza B NOT DETECTED NOT DETECTED Final   Parainfluenza Virus 1 NOT DETECTED NOT DETECTED Final   Parainfluenza Virus 2 NOT DETECTED NOT DETECTED Final   Parainfluenza Virus 3 NOT DETECTED NOT DETECTED Final   Parainfluenza Virus 4 NOT DETECTED NOT DETECTED Final   Respiratory Syncytial Virus NOT DETECTED NOT DETECTED Final   Bordetella pertussis NOT DETECTED NOT DETECTED Final   Bordetella Parapertussis NOT DETECTED NOT DETECTED Final   Chlamydophila pneumoniae NOT DETECTED NOT DETECTED Final   Mycoplasma pneumoniae NOT DETECTED NOT DETECTED Final    Comment: Performed at [facility], [address] [place], [place] [address]    FURTHER DISCHARGE INSTRUCTIONS:  Get Medicines reviewed and adjusted: Please take all your medications with you for your next visit with your Primary MD  Laboratory/radiological data: Please request your Primary MD to go over all hospital tests and procedure/radiological results at the follow up, please ask your Primary MD to get all Hospital records sent to his/her office.  In some cases, they will be blood work, cultures and biopsy results pending at the time of your discharge. Please request that your primary care M.D. goes through all the records of your hospital data and follows up on these results.  Also Note the following: If you experience worsening of your admission symptoms, develop shortness of breath, life threatening emergency, suicidal or homicidal thoughts you must seek medical attention immediately by calling 911 or calling your MD  immediately  if symptoms less severe.  You must read complete instructions/literature along with all the possible adverse reactions/side effects for all the Medicines you take and that have been prescribed to you. Take any new Medicines after you have completely understood and accpet all the possible adverse reactions/side effects.   Do not drive when taking Pain medications or sleeping medications (Benzodaizepines)  Do not take more than prescribed Pain, Sleep and Anxiety Medications. It is not advisable to combine anxiety,sleep and pain medications without talking with your primary care practitioner  Special Instructions: If you have smoked or chewed Tobacco  in the last 2 yrs please stop smoking, stop any regular Alcohol  and or any Recreational drug use.  Wear Seat belts while driving.  Please note: You were cared for by a hospitalist during your hospital stay. Once you are discharged, your primary care physician will handle any further medical issues. Please note that NO REFILLS for any discharge medications will be authorized once you are discharged, as it is imperative that you return to your primary care physician (or establish a relationship with a primary care physician if you do not have one) for your post hospital discharge needs so that they can reassess your need for medications and monitor your lab values.  Total Time spent coordinating discharge including counseling, education and face to face time equals greater than 30 minutes.  SignedJeoffrey Massed 03/12/2023 8:40  AM

## 2023-03-12 NOTE — Progress Notes (Addendum)
SATURATION QUALIFICATIONS: (This note is used to comply with regulatory documentation for home oxygen)  Patient Saturations on Room Air at Rest = 90%  Patient Saturations on Room Air while Ambulating = 87%  Patient Saturations on 2 Liters of oxygen while Ambulating = 93%

## 2023-03-14 LAB — CULTURE, BLOOD (ROUTINE X 2)
Culture: NO GROWTH
Culture: NO GROWTH
Special Requests: ADEQUATE

## 2023-04-07 ENCOUNTER — Other Ambulatory Visit: Payer: Self-pay

## 2023-04-07 ENCOUNTER — Other Ambulatory Visit (HOSPITAL_COMMUNITY): Payer: Self-pay

## 2023-07-15 ENCOUNTER — Encounter: Payer: Self-pay | Admitting: Emergency Medicine

## 2023-07-15 ENCOUNTER — Ambulatory Visit: Payer: No Typology Code available for payment source | Admitting: Emergency Medicine

## 2023-07-15 VITALS — BP 135/88 | HR 108 | Ht 74.0 in | Wt 201.2 lb

## 2023-07-15 DIAGNOSIS — R918 Other nonspecific abnormal finding of lung field: Secondary | ICD-10-CM

## 2023-07-15 DIAGNOSIS — R9389 Abnormal findings on diagnostic imaging of other specified body structures: Secondary | ICD-10-CM | POA: Diagnosis not present

## 2023-07-15 DIAGNOSIS — J449 Chronic obstructive pulmonary disease, unspecified: Secondary | ICD-10-CM

## 2023-07-15 NOTE — Progress Notes (Signed)
Subjective:    Patient ID: Randy Greene, male    DOB: 10/18/1965, 58 y.o.   MRN: [id number]  HPI  ROV 03/06/21 --[name] is a 22 with multiple sclerosis on demand.  Complicated by chronic aspiration.  Also with a history of OSA on CPAP, GERD, mild obstructive disease based on his spirometry.  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  FOB by Dr. Katrinka Blazing 01/14/2021 did not show any evidence of superimposed infection and swallow evaluation was reassuring.  His ANCA, ANA were negative.  Discharged from the hospital on submental oxygen.  Good compliance with his CPAP.  Feels better - more active. Was able to mow his grass. Did not desat on a recent walk at the [place] - can have his O2 d/c'd. He uses albuterol . Is not on a scheduled BD right now.   Repeat CT chest done on 03/03/2021 reviewed by me, shows overall resolution of extensive bilateral airspace disease seen on his prior CT from July.  He has residual bandlike scarring and some architectural distortion.  Stable left lower lobe 3 mm nodule, stable right lower lobe nodule. He did have coronary calcification.    ROV 07/15/2023 --follow-up visit 58 year old gentleman with a history of multiple sclerosis and associated chronic aspiration.  He is been treated with immunosuppression, currently on ocrelizumab.  He also has a history of OSA on CPAP, GERD, possible mild obstruction on his flow-volume loop.  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  Last seen 03/06/2021.  He had COVID-19 again in September 2024 which required hospitalization.  CT chest at that time was negative for pulmonary embolism, showed multifocal patchy groundglass attenuation bilaterally that would be consistent with atypical infection, certainly more prominent than on his prior CT done 03/03/2021.  There was underlying emphysema as  well. He recently underwent TURP for BPH and obstruction.  Occasional cough but no fevers, rare gray sputum. No hemoptysis. He feels that he can exert himself. He is on asmanex bid, spiriva. Albuterol about every morning. He received IV abx and is currently on a course of cefdinir  CT chest from 07/12/2023 done at the [place], report available without images.  There is no evidence of pulmonary embolism, some calcified mediastinal and right hilar adenopathy.  Mild density along the anterior wall of the upper trachea consistent with debris or secretions.  There was a masslike consolidation in the right upper lobe abutting the lateral pleural surface measuring 3.8 x 4.0 x 3.4 cm with spiculated margins.  Also noted was a moderate size consolidation in the right middle lobe extending immediately to the right perihilar lung with some air bronchograms.  There were additional multifocal areas of groundglass and scattered tree-in-bud opacities     Review of Systems As per HPi      Objective:   Physical Exam Vitals:   07/15/23 1002  BP: 135/88  Pulse: (!) 108  SpO2: 95%  Weight: 201 lb 3.2 oz (91.3 kg)  Height: 6\' 2"  (1.88 m)    Gen: Pleasant, well-nourished, in no distress,  normal affect.   ENT: No lesions, oropharynx clear.  Strong voice.  No cough  Neck: No JVD, no stridor  Lungs: No use of accessory muscles, no crackles or wheezing on normal respiration, no wheeze on forced expiration  Cardiovascular: Distant RRR, heart sounds normal, no murmur or gallops, no peripheral edema  Musculoskeletal: No deformities, no cyanosis or clubbing  Neuro: alert, awake, non focal  Skin: Warm, no lesions or rash      Assessment & Plan:  Abnormal CT of the chest Abnormal CT scan of the chest with masslike consolidation in the right upper lobe, moderate consolidation in the right middle lobe extending into the perihilar region.  Suspicious for pneumonia although clinical history not entirely  consistent.  Must also consider malignancy.  He has been started on cefdinir and we will finish this course.  We will then reimage in about 6 weeks.  If abnormalities persist then I think he needs bronchoscopy for tissue diagnosis and culture data.  COPD (chronic obstructive pulmonary disease) (HCC) Medications reviewed.  Continue Spiriva, Asmanex, albuterol as per current orders.  He might benefit from the addition of LABA going forward.  We can consider at his follow-up    Levy Pupa, MD, PhD 07/15/2023, 7:39 PM [facility] [phone] or if no answer before 7:00PM call [phone] For any issues after 7:00PM please call eLink [phone]

## 2023-07-15 NOTE — Assessment & Plan Note (Signed)
Abnormal CT scan of the chest with masslike consolidation in the right upper lobe, moderate consolidation in the right middle lobe extending into the perihilar region.  Suspicious for pneumonia although clinical history not entirely consistent.  Must also consider malignancy.  He has been started on cefdinir and we will finish this course.  We will then reimage in about 6 weeks.  If abnormalities persist then I think he needs bronchoscopy for tissue diagnosis and culture data.

## 2023-07-15 NOTE — Assessment & Plan Note (Signed)
Medications reviewed.  Continue Spiriva, Asmanex, albuterol as per current orders.  He might benefit from the addition of LABA going forward.  We can consider at his follow-up

## 2023-07-15 NOTE — Patient Instructions (Signed)
Complete your antibiotics as planned We will repeat your CT scan of the chest in about 6 weeks to look for interval change compared with 07/12/2023 Continue your inhaled medication as you have been taking them: Asmanex, Spiriva, albuterol Follow Dr. Delton Coombes 08/25/2023 to review your CT scan results and plan next steps in evaluation

## 2023-08-16 ENCOUNTER — Inpatient Hospital Stay: Admission: RE | Admit: 2023-08-16 | Payer: Medicare PPO | Source: Ambulatory Visit

## 2023-08-19 ENCOUNTER — Ambulatory Visit
Admission: RE | Admit: 2023-08-19 | Discharge: 2023-08-19 | Disposition: A | Discharge status: Home / Self Care | Payer: Medicare PPO | Source: Ambulatory Visit | Attending: Emergency Medicine | Admitting: Emergency Medicine

## 2023-08-19 DIAGNOSIS — R918 Other nonspecific abnormal finding of lung field: Secondary | ICD-10-CM

## 2023-08-19 NOTE — Progress Notes (Signed)
 Follow up on Lung mass,  Former smoker

## 2023-08-25 ENCOUNTER — Ambulatory Visit: Payer: Medicare PPO | Admitting: Emergency Medicine

## 2023-10-02 ENCOUNTER — Emergency Department (HOSPITAL_COMMUNITY)

## 2023-10-02 ENCOUNTER — Encounter (HOSPITAL_COMMUNITY): Payer: Self-pay | Admitting: Emergency Medicine

## 2023-10-02 ENCOUNTER — Other Ambulatory Visit: Payer: Self-pay

## 2023-10-02 ENCOUNTER — Observation Stay (HOSPITAL_COMMUNITY)
Admission: EM | Admit: 2023-10-02 | Discharge: 2023-10-03 | Disposition: A | Discharge status: Home / Self Care | Attending: Internal Medicine | Admitting: Internal Medicine

## 2023-10-02 DIAGNOSIS — R4182 Altered mental status, unspecified: Principal | ICD-10-CM

## 2023-10-02 DIAGNOSIS — G9341 Metabolic encephalopathy: Secondary | ICD-10-CM | POA: Diagnosis not present

## 2023-10-02 DIAGNOSIS — N400 Enlarged prostate without lower urinary tract symptoms: Secondary | ICD-10-CM | POA: Insufficient documentation

## 2023-10-02 DIAGNOSIS — J189 Pneumonia, unspecified organism: Principal | ICD-10-CM

## 2023-10-02 DIAGNOSIS — I2510 Atherosclerotic heart disease of native coronary artery without angina pectoris: Secondary | ICD-10-CM | POA: Insufficient documentation

## 2023-10-02 DIAGNOSIS — Z87891 Personal history of nicotine dependence: Secondary | ICD-10-CM | POA: Insufficient documentation

## 2023-10-02 DIAGNOSIS — G35 Multiple sclerosis: Secondary | ICD-10-CM | POA: Diagnosis not present

## 2023-10-02 DIAGNOSIS — Z1152 Encounter for screening for COVID-19: Secondary | ICD-10-CM | POA: Diagnosis not present

## 2023-10-02 DIAGNOSIS — E876 Hypokalemia: Secondary | ICD-10-CM | POA: Insufficient documentation

## 2023-10-02 DIAGNOSIS — J449 Chronic obstructive pulmonary disease, unspecified: Secondary | ICD-10-CM | POA: Diagnosis not present

## 2023-10-02 DIAGNOSIS — I10 Essential (primary) hypertension: Secondary | ICD-10-CM | POA: Insufficient documentation

## 2023-10-02 DIAGNOSIS — M792 Neuralgia and neuritis, unspecified: Secondary | ICD-10-CM | POA: Diagnosis not present

## 2023-10-02 DIAGNOSIS — Z79899 Other long term (current) drug therapy: Secondary | ICD-10-CM | POA: Insufficient documentation

## 2023-10-02 DIAGNOSIS — K219 Gastro-esophageal reflux disease without esophagitis: Secondary | ICD-10-CM | POA: Diagnosis not present

## 2023-10-02 DIAGNOSIS — Z96653 Presence of artificial knee joint, bilateral: Secondary | ICD-10-CM | POA: Diagnosis not present

## 2023-10-02 LAB — COMPREHENSIVE METABOLIC PANEL WITH GFR
ALT: 15 U/L (ref 0–44)
AST: 18 U/L (ref 15–41)
Albumin: 3.5 g/dL (ref 3.5–5.0)
Alkaline Phosphatase: 69 U/L (ref 38–126)
Anion gap: 10 (ref 5–15)
BUN: 9 mg/dL (ref 6–20)
CO2: 23 mmol/L (ref 22–32)
Calcium: 8.5 mg/dL — ABNORMAL LOW (ref 8.9–10.3)
Chloride: 109 mmol/L (ref 98–111)
Creatinine, Ser: 0.65 mg/dL (ref 0.61–1.24)
GFR, Estimated: >60 mL/min (ref >60)
Glucose, Bld: 114 mg/dL — ABNORMAL HIGH (ref 70–99)
Potassium: 3.3 mmol/L — ABNORMAL LOW (ref 3.5–5.1)
Sodium: 142 mmol/L (ref 135–145)
Total Bilirubin: 0.5 mg/dL (ref 0.0–1.2)
Total Protein: 6.2 g/dL — ABNORMAL LOW (ref 6.5–8.1)

## 2023-10-02 LAB — CBC WITH DIFFERENTIAL/PLATELET
Abs Immature Granulocytes: 0.1 10*3/uL — ABNORMAL HIGH (ref 0.00–0.07)
Basophils Absolute: 0.1 10*3/uL (ref 0.0–0.1)
Basophils Relative: 0 %
Eosinophils Absolute: 0.3 10*3/uL (ref 0.0–0.5)
Eosinophils Relative: 2 %
HCT: 49.5 % (ref 39.0–52.0)
Hemoglobin: 16.5 g/dL (ref 13.0–17.0)
Immature Granulocytes: 1 %
Lymphocytes Relative: 6 %
Lymphs Abs: 1.3 10*3/uL (ref 0.7–4.0)
MCH: 31.4 pg (ref 26.0–34.0)
MCHC: 33.3 g/dL (ref 30.0–36.0)
MCV: 94.3 fL (ref 80.0–100.0)
Monocytes Absolute: 1.6 10*3/uL — ABNORMAL HIGH (ref 0.1–1.0)
Monocytes Relative: 7 %
Neutro Abs: 18.3 10*3/uL — ABNORMAL HIGH (ref 1.7–7.7)
Neutrophils Relative %: 84 %
Platelets: 244 10*3/uL (ref 150–400)
RBC: 5.25 MIL/uL (ref 4.22–5.81)
RDW: 13.9 % (ref 11.5–15.5)
WBC: 21.7 10*3/uL — ABNORMAL HIGH (ref 4.0–10.5)
nRBC: 0 % (ref 0.0–0.2)

## 2023-10-02 LAB — I-STAT VENOUS BLOOD GAS, ED
Acid-Base Excess: 3 mmol/L — ABNORMAL HIGH (ref 0.0–2.0)
Bicarbonate: 28 mmol/L (ref 20.0–28.0)
Calcium, Ion: 1.08 mmol/L — ABNORMAL LOW (ref 1.15–1.40)
HCT: 50 % (ref 39.0–52.0)
Hemoglobin: 17 g/dL (ref 13.0–17.0)
O2 Saturation: 60 %
Potassium: 4.5 mmol/L (ref 3.5–5.1)
Sodium: 140 mmol/L (ref 135–145)
TCO2: 29 mmol/L (ref 22–32)
pCO2, Ven: 44.2 mmHg (ref 44–60)
pH, Ven: 7.41 (ref 7.25–7.43)
pO2, Ven: 31 mmHg — CL (ref 32–45)

## 2023-10-02 LAB — ETHANOL: Alcohol, Ethyl (B): <10 mg/dL (ref <10)

## 2023-10-02 LAB — ACETAMINOPHEN LEVEL: Acetaminophen (Tylenol), Serum: <10 ug/mL — ABNORMAL LOW (ref 10–30)

## 2023-10-02 LAB — SALICYLATE LEVEL: Salicylate Lvl: <7 mg/dL — ABNORMAL LOW (ref 7.0–30.0)

## 2023-10-02 LAB — TROPONIN I (HIGH SENSITIVITY)
Troponin I (High Sensitivity): 5 ng/L (ref <18)
Troponin I (High Sensitivity): 7 ng/L (ref <18)

## 2023-10-02 MED ORDER — SODIUM CHLORIDE 0.9 % IV SOLN
2.0000 g | Freq: Once | INTRAVENOUS | Status: AC
  Administered 2023-10-02: 2 g via INTRAVENOUS
  Filled 2023-10-02: qty 20

## 2023-10-02 MED ORDER — HALOPERIDOL LACTATE 5 MG/ML IJ SOLN
2.0000 mg | Freq: Once | INTRAMUSCULAR | Status: AC
  Administered 2023-10-02: 2 mg via INTRAVENOUS
  Filled 2023-10-02: qty 1

## 2023-10-02 MED ORDER — SODIUM CHLORIDE 0.9 % IV SOLN
500.0000 mg | Freq: Once | INTRAVENOUS | Status: AC
  Administered 2023-10-02: 500 mg via INTRAVENOUS
  Filled 2023-10-02: qty 5

## 2023-10-02 MED ORDER — HYDRALAZINE HCL 20 MG/ML IJ SOLN: 5.0000 mg | INTRAMUSCULAR | Status: DC | PRN

## 2023-10-02 MED ORDER — CALCIUM GLUCONATE-NACL 1-0.675 GM/50ML-% IV SOLN
1.0000 g | Freq: Once | INTRAVENOUS | Status: AC
  Administered 2023-10-03: 1000 mg via INTRAVENOUS
  Filled 2023-10-02: qty 50

## 2023-10-02 MED ORDER — POTASSIUM CHLORIDE CRYS ER 20 MEQ PO TBCR
40.0000 meq | EXTENDED_RELEASE_TABLET | Freq: Once | ORAL | Status: AC
  Administered 2023-10-03: 40 meq via ORAL
  Filled 2023-10-02: qty 2

## 2023-10-02 MED ORDER — ACETAMINOPHEN 650 MG RE SUPP: 650.0000 mg | Freq: Four times a day (QID) | RECTAL | Status: DC | PRN

## 2023-10-02 MED ORDER — IPRATROPIUM-ALBUTEROL 0.5-2.5 (3) MG/3ML IN SOLN: 3.0000 mL | Freq: Four times a day (QID) | RESPIRATORY_TRACT | Status: DC | PRN

## 2023-10-02 MED ORDER — ACETAMINOPHEN 325 MG PO TABS: 650.0000 mg | ORAL_TABLET | Freq: Four times a day (QID) | ORAL | Status: DC | PRN

## 2023-10-02 MED ORDER — ENOXAPARIN SODIUM 40 MG/0.4ML IJ SOSY
40.0000 mg | PREFILLED_SYRINGE | INTRAMUSCULAR | Status: DC
  Administered 2023-10-03: 40 mg via SUBCUTANEOUS
  Filled 2023-10-02: qty 0.4

## 2023-10-02 MED ORDER — SODIUM CHLORIDE 0.9 % IV SOLN: 500.0000 mg | INTRAVENOUS | Status: DC

## 2023-10-02 MED ORDER — SODIUM CHLORIDE 0.9 % IV SOLN: 2.0000 g | INTRAVENOUS | Status: DC

## 2023-10-02 MED ORDER — GUAIFENESIN-DM 100-10 MG/5ML PO SYRP: 5.0000 mL | ORAL_SOLUTION | ORAL | Status: DC | PRN

## 2023-10-02 NOTE — ED Notes (Addendum)
 Pt was stuck for repeat LG. Wasn't successful.

## 2023-10-02 NOTE — ED Notes (Signed)
 This RN called CCMD to have patient admitted for monitoring.

## 2023-10-02 NOTE — ED Notes (Signed)
 Girlfriend Mariah Shines [phone] would like an update asap

## 2023-10-02 NOTE — ED Notes (Signed)
 ED TO INPATIENT HANDOFF REPORT  ED Nurse Name and Phone #: Angelina Kempf RN [phone]  S Name/Age/Gender Randy Greene 58 y.o. male Room/Bed: 018C/018C  Code Status   Code Status: Prior  Home/SNF/Other Home Patient oriented to: self Is this baseline? No   Triage Complete: Triage complete  Chief Complaint Pneumonia [J18.9]  Triage Note Per [facility] pt coming from home- family friend came to check on patient and found him wondering around the house not acting himself. Patient states this is normal when his MS flares up. Patient alert to self and repeatedly stating his birthday when asked what year it is or where he is.    Allergies Allergies  Allergen Reactions   Penicillins Hives, Other (See Comments) and Rash    "My face got flush"   Tape Rash   Zoloft [Sertraline] Rash   Food Nausea And Vomiting and Other (See Comments)    GREEN PEPPERS- flushed  PT DOES NOT EAT MEAT   Morphine Itching   Mushroom Extract Complex (Obsolete) Nausea And Vomiting and Other (See Comments)    And flushed Other reaction(s): Other (See Comments) And flushed   Penicillin G Rash   Pravastatin Other (See Comments)    Level of Care/Admitting Diagnosis ED Disposition     ED Disposition  Admit   Condition  --   Comment  Hospital Area: [facility] [[id number]]  Level of Care: Telemetry Medical [104]  May place patient in observation at [facility] or [facility] if equivalent level of care is available:: Yes  Covid Evaluation: Asymptomatic - no recent exposure (last 10 days) testing not required  Diagnosis: Pneumonia [227785]  Admitting Physician: Juliette Oh [[id number]]  Attending Physician: Juliette Oh [[id number]]          B Medical/Surgery History Past Medical History:  Diagnosis Date   Acid reflux    Arthritis    Aspiration pneumonia (HCC)    Asthma    COPD (chronic obstructive pulmonary disease) (HCC)    Delayed wound healing    left knee    Dyspnea    Enlarged prostate    Hypercapnic respiratory failure, chronic (HCC)    Hypertension    MS (multiple sclerosis) (HCC)    Past Surgical History:  Procedure Laterality Date   ARTHROSCOPIC Left    BRONCHIAL WASHINGS  01/14/2021   Procedure: BRONCHIAL WASHINGS;  Surgeon: Josiah Nigh, MD;  Location: [facility] ENDOSCOPY;  Service: Pulmonary;;   HERNIA REPAIR     INCISION AND DRAINAGE Left 01/04/2017   Procedure: INCISION AND DRAINAGE LEFT KNEE;  Surgeon: Adonica Hoose, MD;  Location: [facility] OR;  Service: Orthopedics;  Laterality: Left;   jawsurgery     KNEE ARTHROPLASTY Left 08/10/2016   Procedure: LEFT TOTAL KNEE ARTHROPLASTY WITH COMPUTER NAVIGATION;  Surgeon: Adonica Hoose, MD;  Location: [facility] OR;  Service: Orthopedics;  Laterality: Left;   KNEE ARTHROPLASTY Right 09/30/2022   Procedure: COMPUTER ASSISTED TOTAL KNEE ARTHROPLASTY;  Surgeon: Adonica Hoose, MD;  Location: [facility] ORS;  Service: Orthopedics;  Laterality: Right;  160   knees scope Right    TONSILLECTOMY AND ADENOIDECTOMY     VIDEO BRONCHOSCOPY Bilateral 01/14/2021   Procedure: VIDEO BRONCHOSCOPY WITHOUT FLUORO;  Surgeon: Josiah Nigh, MD;  Location: [facility] ENDOSCOPY;  Service: Pulmonary;  Laterality: Bilateral;     A IV Location/Drains/Wounds Patient Lines/Drains/Airways Status     Active Line/Drains/Airways     Name Placement date Placement time Site Days   Peripheral IV 10/02/23 20 G 1" Posterior;Right  Hand 10/02/23  1804  Hand  less than 1            Intake/Output Last 24 hours No intake or output data in the 24 hours ending 10/02/23 2324  Labs/Imaging Results for orders placed or performed during the hospital encounter of 10/02/23 (from the past 48 hours)  CBC with Differential     Status: Abnormal   Collection Time: 10/02/23  5:40 PM  Result Value Ref Range   WBC 21.7 (H) 4.0 - 10.5 K/uL   RBC 5.25 4.22 - 5.81 MIL/uL   Hemoglobin 16.5 13.0 - 17.0 g/dL   HCT 40.9 81.1 - 91.4 %   MCV 94.3 80.0 - 100.0 fL    MCH 31.4 26.0 - 34.0 pg   MCHC 33.3 30.0 - 36.0 g/dL   RDW 78.2 95.6 - 21.3 %   Platelets 244 150 - 400 K/uL   nRBC 0.0 0.0 - 0.2 %   Neutrophils Relative % 84 %   Neutro Abs 18.3 (H) 1.7 - 7.7 K/uL   Lymphocytes Relative 6 %   Lymphs Abs 1.3 0.7 - 4.0 K/uL   Monocytes Relative 7 %   Monocytes Absolute 1.6 (H) 0.1 - 1.0 K/uL   Eosinophils Relative 2 %   Eosinophils Absolute 0.3 0.0 - 0.5 K/uL   Basophils Relative 0 %   Basophils Absolute 0.1 0.0 - 0.1 K/uL   Immature Granulocytes 1 %   Abs Immature Granulocytes 0.10 (H) 0.00 - 0.07 K/uL    Comment: Performed at [facility], [address] [place], [place] [address]  Troponin I (High Sensitivity)     Status: None   Collection Time: 10/02/23  5:40 PM  Result Value Ref Range   Troponin I (High Sensitivity) 5 <18 ng/L    Comment: (NOTE) Elevated high sensitivity troponin I (hsTnI) values and significant  changes across serial measurements may suggest ACS but many other  chronic and acute conditions are known to elevate hsTnI results.  Refer to the "Links" section for chest pain algorithms and additional  guidance. Performed at [facility], [address] [place], [place] [address]   Ethanol     Status: None   Collection Time: 10/02/23  5:40 PM  Result Value Ref Range   Alcohol, Ethyl (B) <10 <10 mg/dL    Comment: (NOTE) Lowest detectable limit for serum alcohol is 10 mg/dL.  For medical purposes only. Performed at [facility], [address], [place], [place] [address]   Salicylate level     Status: Abnormal   Collection Time: 10/02/23  5:40 PM  Result Value Ref Range   Salicylate Lvl <7.0 (L) 7.0 - 30.0 mg/dL    Comment: Performed at [facility], [address], [place], [place] [address]  Acetaminophen level     Status: Abnormal   Collection Time: 10/02/23  5:40 PM  Result Value Ref Range   Acetaminophen (Tylenol), Serum <10 (L) 10 - 30 ug/mL    Comment: (NOTE) Therapeutic  concentrations vary significantly. A range of 10-30 ug/mL  may be an effective concentration for many patients. However, some  are best treated at concentrations outside of this range. Acetaminophen concentrations >150 ug/mL at 4 hours after ingestion  and >50 ug/mL at 12 hours after ingestion are often associated with  toxic reactions.  Performed at [facility], [address], [place], [place] [address]   I-Stat venous blood gas, [facility], [facility], [facility])  Status: Abnormal   Collection Time: 10/02/23  5:46 PM  Result Value Ref Range   pH, Ven 7.410 7.25 - 7.43   pCO2, Ven 44.2 44 - 60 mmHg   pO2, Ven 31 (LL) 32 - 45 mmHg   Bicarbonate 28.0 20.0 - 28.0 mmol/L   TCO2 29 22 - 32 mmol/L   O2 Saturation 60 %   Acid-Base Excess 3.0 (H) 0.0 - 2.0 mmol/L   Sodium 140 135 - 145 mmol/L   Potassium 4.5 3.5 - 5.1 mmol/L   Calcium, Ion 1.08 (L) 1.15 - 1.40 mmol/L   HCT 50.0 39.0 - 52.0 %   Hemoglobin 17.0 13.0 - 17.0 g/dL   Sample type VENOUS    Comment NOTIFIED PHYSICIAN   Comprehensive metabolic panel with GFR     Status: Abnormal   Collection Time: 10/02/23  8:49 PM  Result Value Ref Range   Sodium 142 135 - 145 mmol/L   Potassium 3.3 (L) 3.5 - 5.1 mmol/L   Chloride 109 98 - 111 mmol/L   CO2 23 22 - 32 mmol/L   Glucose, Bld 114 (H) 70 - 99 mg/dL    Comment: Glucose reference range applies only to samples taken after fasting for at least 8 hours.   BUN 9 6 - 20 mg/dL   Creatinine, Ser 2.59 0.61 - 1.24 mg/dL   Calcium 8.5 (L) 8.9 - 10.3 mg/dL   Total Protein 6.2 (L) 6.5 - 8.1 g/dL   Albumin 3.5 3.5 - 5.0 g/dL   AST 18 15 - 41 U/L   ALT 15 0 - 44 U/L   Alkaline Phosphatase 69 38 - 126 U/L   Total Bilirubin 0.5 0.0 - 1.2 mg/dL   GFR, Estimated >56 >38 mL/min    Comment: (NOTE) Calculated using the CKD-EPI Creatinine Equation (2021)    Anion gap 10 5 - 15    Comment: Performed at [facility], [address] [place], [place] [address]  Troponin I (High Sensitivity)      Status: None   Collection Time: 10/02/23  8:49 PM  Result Value Ref Range   Troponin I (High Sensitivity) 7 <18 ng/L    Comment: (NOTE) Elevated high sensitivity troponin I (hsTnI) values and significant  changes across serial measurements may suggest ACS but many other  chronic and acute conditions are known to elevate hsTnI results.  Refer to the "Links" section for chest pain algorithms and additional  guidance. Performed at [facility], [address] [place], [place] [address]    CT Head Wo Contrast Result Date: 10/02/2023 CLINICAL DATA:  Initial evaluation for acute altered mental status. EXAM: CT HEAD WITHOUT CONTRAST TECHNIQUE: Contiguous axial images were obtained from the base of the skull through the vertex without intravenous contrast. RADIATION DOSE REDUCTION: This exam was performed according to the departmental dose-optimization program which includes automated exposure control, adjustment of the mA and/or kV according to patient size and/or use of iterative reconstruction technique. COMPARISON:  Prior CT from 06/11/2020. FINDINGS: Brain: Cerebral volume within normal limits. Patchy hypodensity involving the supratentorial cerebral white matter, consistent with chronic small vessel ischemic disease, mild for age. No acute intracranial hemorrhage. No acute large vessel territory infarct. No mass lesion or midline shift. No hydrocephalus or extra-axial fluid collection. Small benign arachnoid cyst noted just superior to the tectum. Vascular: No abnormal hyperdense vessel. Skull: Scalp soft tissues demonstrate no acute finding. Calvarium intact. Sinuses/Orbits: Globes orbital soft tissues within normal limits. Visualized paranasal  sinuses are clear. No mastoid effusion. Other: None. IMPRESSION: 1. No acute intracranial abnormality. 2. Mild chronic microvascular ischemic disease for age. Electronically Signed   By: Virgia Griffins M.D.   On: 10/02/2023 19:41   DG Chest  Portable 1 View Result Date: 10/02/2023 CLINICAL DATA:  Cough.  Altered mental status. EXAM: PORTABLE CHEST 1 VIEW COMPARISON:  03/09/2023. Chest CT dated 08/19/2023 and chest CTA dated 06/29/2023. FINDINGS: Stable borderline enlarged cardiac silhouette. Less prominent shifting lung densities bilaterally with the exception of mildly increased patchy density in the left lower lobe. No pleural fluid seen. Thoracic spine degenerative changes. IMPRESSION: Less prominent shifting lung densities bilaterally with the exception of mildly increased patchy density in the left lower lobe. This could represent interval pneumonia, including previously suggested atypical pneumonia or inflammatory process. Electronically Signed   By: Catherin Closs M.D.   On: 10/02/2023 18:19    Pending Labs Unresulted Labs (From admission, onward)     Start     Ordered   10/02/23 1825  Blood culture (routine x 2)  BLOOD CULTURE X 2,   R (with STAT occurrences)      10/02/23 1825   10/02/23 1738  Urinalysis, w/ Reflex to Culture (Infection Suspected) -Urine, Clean Catch  Once,   URGENT       Question:  Specimen Source  Answer:  Urine, Clean Catch   10/02/23 1738   10/02/23 1738  Rapid urine drug screen (hospital performed)  ONCE - STAT,   STAT        10/02/23 1738            Vitals/Pain Today's Vitals   10/02/23 1927 10/02/23 2015 10/02/23 2030 10/02/23 2200  BP:   (!) 168/83 (!) 169/78  Pulse:  86 88 84  Resp:  16 15 13   Temp:    99.3 F (37.4 C)  TempSrc:    Axillary  SpO2:  98%  100%  Weight:      Height:      PainSc: 0-No pain       Isolation Precautions No active isolations  Medications Medications  haloperidol lactate (HALDOL) injection 2 mg (2 mg Intravenous Given 10/02/23 1804)  cefTRIAXone (ROCEPHIN) 2 g in sodium chloride 0.9 % 100 mL IVPB (0 g Intravenous Stopped 10/02/23 1912)  azithromycin (ZITHROMAX) 500 mg in sodium chloride 0.9 % 250 mL IVPB (0 mg Intravenous Stopped 10/02/23 1943)     Mobility walks   Walks normally at home at baseline, patient altered unable to ambulate.  Focused Assessments Neuro Assessment Handoff:  Swallow screen pass?  Not ordered, patient confused Cardiac Rhythm: Normal sinus rhythm       Neuro Assessment: Exceptions to [facility] Neuro Checks:      Has TPA been given? No If patient is a Neuro Trauma and patient is going to OR before floor call report to 4N Charge nurse: [phone] or [phone]   R Recommendations: See Admitting Provider Note  Report given to:   Additional Notes:

## 2023-10-02 NOTE — ED Provider Notes (Signed)
[place] [facility] AT [facility] Provider Note   CSN: [id number] Arrival date & time: 10/02/23  1658     History  Chief Complaint  Patient presents with   Altered Mental Status    Randy Greene is a 58 y.o. male with history of COPD, ILD, multiple sclerosis, trigeminal neuralgia, CAD, OSA on CPAP presents due to concerns for altered mental status.  Patient's ex-wife is at bedside, states that she was doing a wellness check on him today when he did not answer.  Last known normal was 5 PM yesterday.  When she presented to his house, he was wondering did not know who she was and was unable to identify anybody that he knows well, including his daughters.  Wife also notes several things knocked over in his house, and also endorses a remote history of alcohol  use.  States that he is currently on treatment for his MS.  Has had prior presentations similar to this where he had aspiration pneumonia.  She is also noted that for the past few days, he has developed a dry cough but with no known fevers at home.   Altered Mental Status      Home Medications Prior to Admission medications   Medication Sig Start Date End Date Taking? Authorizing Provider  acetaminophen -codeine  (TYLENOL  #3) 300-30 MG tablet Take 1 tablet by mouth 2 (two) times daily as needed for moderate pain. 05/06/22   [provider]  albuterol  (PROVENTIL  HFA;VENTOLIN  HFA) 108 (90 BASE) MCG/ACT inhaler Inhale 2 puffs into the lungs every 6 (six) hours as needed for wheezing or shortness of breath.    [provider]  alfuzosin  (UROXATRAL ) 10 MG 24 hr tablet Take 10 mg by mouth every evening.    [provider]  amphetamine -dextroamphetamine  (ADDERALL XR) 30 MG 24 hr capsule Take 30 mg by mouth 2 (two) times daily. 09/30/21   [provider]  aspirin  EC 81 MG tablet Take by mouth. 10/12/22   [provider]  B Complex Vitamins (VITAMIN B COMPLEX W/B-12) TABS  VITAMIN B COMPLEX W/B-12 TAB                                      Non-VA                                                                      TAKE ONE TABLET BY MOUTH DAILY  Sep 04, 2008                                        Non-VA  Documented by:  ST MARTIN,CHARLES D                                    Documented at:  [facility] 09/04/08   [provider]  baclofen  (LIORESAL ) 20 MG tablet Take 20 mg by mouth 4 (four) times daily. 09/23/22   [provider]  calcium  carbonate (OS-CAL) 1250 (500 Ca) MG chewable tablet Chew 1 tablet by mouth 2 (two) times daily. 07/24/21   [provider]  carboxymethylcellulose 1 % ophthalmic solution Place 1 drop into both eyes as needed (dry eyes).    [provider]  cefdinir (OMNICEF) 300 MG capsule Take 300 mg by mouth 2 (two) times daily.    [provider]  Cholecalciferol (VITAMIN D ) 50 MCG (2000 UT) tablet Take 6,000 Units by mouth daily.    [provider]  cyanocobalamin  (,VITAMIN B-12,) 1000 MCG/ML injection Inject 1,000 mcg into the muscle every 28 (twenty-eight) days. 02/04/21   [provider]  cyclobenzaprine (FLEXERIL) 10 MG tablet Take 10 mg by mouth daily as needed (severe muscle spasms). 06/28/22   [provider]  diclofenac  Sodium (VOLTAREN ) 1 % GEL Apply 2 g topically 2 (two) times daily as needed (pain). 11/27/19   [provider]  dronabinol  (MARINOL ) 2.5 MG capsule Take 5 mg by mouth 3 (three) times daily.    [provider]  esomeprazole (NEXIUM) 40 MG capsule Take 40 mg by mouth 2 (two) times daily before a meal.    [provider]  famotidine  (PEPCID ) 40 MG tablet Take 40 mg by mouth at bedtime.    [provider]  finasteride  (PROSCAR ) 5 MG tablet Take 5 mg by mouth in the morning.    [provider]  fluorouracil  (EFUDEX) 5 % cream Apply 1 application  topically daily as needed (brown spots on forehead).    [provider]  fluticasone  (FLONASE ) 50 MCG/ACT nasal spray Place 1 spray into both nostrils daily.    [provider]  ketotifen  (ZADITOR ) 0.025 % ophthalmic solution Place 1 drop into both eyes 2 (two) times daily as needed (itching).    [provider]  LACTOBACILLUS PO Take 1 capsule by mouth every morning. 06/29/22   [provider]  magnesium  (MAGTAB) 84 MG ( ) TBCR SR tablet Take 42 mg by mouth 2 (two) times daily. 07/24/21   [provider]  meloxicam (MOBIC) 15 MG tablet Take 15 mg by mouth daily. For knee pain 01/05/23   [provider]  metoCLOPramide  (REGLAN ) 10 MG tablet Take 10 mg by mouth every evening. 07/24/21   [provider]  Mometasone  Furoate 100 MCG/ACT AERO Take 1 spray by mouth every morning. 10/23/22   [provider]  montelukast  (SINGULAIR ) 10 MG tablet Take 10 mg by mouth at bedtime.    [provider]  naloxone  (NARCAN ) nasal spray 4 mg/0.1 mL Place 1 spray into the nose See admin instructions. SPRAY 1 SPRAY INTO ONE NOSTRIL AS DIRECTED FOR OPIOID OVERDOSE (TURN PERSON ON SIDE AFTER DOSE. IF NO RESPONSE IN 2-3 MINUTES OR PERSON RESPONDS BUT RELAPSES, REPEAT USING A NEW SPRAY DEVICE AND SPRAY INTO THE OTHER NOSTRIL. CALL 911 AFTER USE.) * EMERGENCY USE ONLY * 01/16/20   [provider]  Ofatumumab 20 MG/0.4ML SOAJ Inject 20 mg into the skin every 30 (thirty) days. Kesimpta 11/20/22   [provider]  Omega-3 Fatty Acids (FISH OIL) 1000 MG CAPS Take 1,000 mg by mouth in the morning, at noon, and at bedtime.    [provider]  phenytoin  (DILANTIN ) 100 MG ER capsule Take 100 mg by mouth 2 (two) times daily. 07/18/20   [provider]  predniSONE  (DELTASONE ) 10 MG tablet Take 40 mg daily for 3 days, 30 mg daily for 3 days, 20 mg daily for 2 days,10 mg daily for 1 days, then  stop 03/12/23   Burton Casey, MD  pregabalin  (LYRICA ) 150 MG capsule Take 150-300 mg by mouth See admin instructions. Take 1 capsule by mouth twice a day, then take 2 capsules in the evening 11/12/22   [provider]  Probiotic Product (ALIGN) 4 MG CAPS Take 4 mg by mouth in the morning and at bedtime.    [provider]  salicyclic acid-sulfur (SEBULEX) 2-2 % shampoo Apply 1 application  topically daily as needed for itching. 07/13/22   [provider]  Tiotropium Bromide Monohydrate (SPIRIVA RESPIMAT) 2.5 MCG/ACT AERS Inhale 1 each into the lungs daily.    [provider]  traMADol  (ULTRAM ) 50 MG tablet Take 50 mg by mouth daily as needed for moderate pain. 07/14/22   [provider]  urea (CARMOL) 20 % cream Apply 1 application  topically daily as needed (for bottom of feet). 10/12/22   [provider]  vitamin C  (ASCORBIC ACID ) 500 MG tablet Take 500 mg by mouth See admin instructions. Take 1 tablet by mouth every 2 days    [provider]  vitamin E 180 MG (400 UNITS) capsule Take 400 Units by mouth daily.    [provider]  zinc sulfate 220 (50 Zn) MG capsule Take 220 mg by mouth every other day. 06/28/22   [provider]      Allergies    Penicillins, Tape, Zoloft [sertraline], Food, Morphine, Mushroom extract complex (obsolete), Penicillin g, and Pravastatin    Review of Systems   Review of Systems  Physical Exam Updated Vital Signs BP (!) 152/98   Pulse 83   Temp 99.3 F (37.4 C) (Axillary)   Resp 15   Ht 6\' 2"  (1.88 m)   Wt 91.2 kg   SpO2 100%   BMI 25.81 kg/m  Physical Exam Vitals and nursing note reviewed.  Constitutional:      General: He is not in acute distress.    Appearance: He is well-developed.  HENT:     Head: Normocephalic and atraumatic.     Right Ear: External ear normal.     Left Ear: External ear normal.  Eyes:     Conjunctiva/sclera: Conjunctivae normal.   Cardiovascular:     Rate and Rhythm: Normal rate and regular rhythm.     Heart sounds: No murmur heard. Pulmonary:     Effort: Pulmonary effort is normal. No respiratory distress.     Breath sounds: Normal breath sounds. No wheezing.     Comments: Dry cough Abdominal:     General: Abdomen is flat. There is no distension.     Palpations: Abdomen is soft.     Tenderness: There is no abdominal tenderness. There is no guarding or rebound.  Musculoskeletal:        General: No swelling.     Cervical back: Neck supple.  Skin:    General: Skin is warm and dry.     Capillary Refill: Capillary refill takes less than 2 seconds.  Neurological:  Mental Status: He is alert.     Comments: Confused, able to answer some questions intermittently.  Moving all 4 extremities with equal strength.  No obvious cranial nerve deficits.  Psychiatric:        Mood and Affect: Mood normal.     ED Results / Procedures / Treatments   Labs (all labs ordered are listed, but only abnormal results are displayed) Labs Reviewed  CBC WITH DIFFERENTIAL/PLATELET - Abnormal; Notable for the following components:      Result Value   WBC 21.7 (*)    Neutro Abs 18.3 (*)    Monocytes Absolute 1.6 (*)    Abs Immature Granulocytes 0.10 (*)    All other components within normal limits  SALICYLATE LEVEL - Abnormal; Notable for the following components:   Salicylate Lvl <7.0 (*)    All other components within normal limits  ACETAMINOPHEN  LEVEL - Abnormal; Notable for the following components:   Acetaminophen  (Tylenol ), Serum <10 (*)    All other components within normal limits  COMPREHENSIVE METABOLIC PANEL WITH GFR - Abnormal; Notable for the following components:   Potassium 3.3 (*)    Glucose, Bld 114 (*)    Calcium  8.5 (*)    Total Protein 6.2 (*)    All other components within normal limits  I-STAT VENOUS BLOOD GAS, ED - Abnormal; Notable for the following components:   pO2, Ven 31 (*)    Acid-Base Excess  3.0 (*)    Calcium , Ion 1.08 (*)    All other components within normal limits  CULTURE, BLOOD (ROUTINE X 2)  CULTURE, BLOOD (ROUTINE X 2)  SARS CORONAVIRUS 2 BY RT PCR  RESPIRATORY PANEL BY PCR  EXPECTORATED SPUTUM ASSESSMENT W GRAM STAIN, RFLX TO RESP C  ETHANOL  URINALYSIS, W/ REFLEX TO CULTURE (INFECTION SUSPECTED)  RAPID URINE DRUG SCREEN, HOSP PERFORMED  CBC  BASIC METABOLIC PANEL WITH GFR  CALCIUM , IONIZED  MAGNESIUM   LEGIONELLA PNEUMOPHILA SEROGP 1 UR AG  STREP PNEUMONIAE URINARY ANTIGEN  TSH  VITAMIN B12  AMMONIA  TROPONIN I (HIGH SENSITIVITY)  TROPONIN I (HIGH SENSITIVITY)    EKG None  Radiology CT Head Wo Contrast Result Date: 10/02/2023 CLINICAL DATA:  Initial evaluation for acute altered mental status. EXAM: CT HEAD WITHOUT CONTRAST TECHNIQUE: Contiguous axial images were obtained from the base of the skull through the vertex without intravenous contrast. RADIATION DOSE REDUCTION: This exam was performed according to the departmental dose-optimization program which includes automated exposure control, adjustment of the mA and/or kV according to patient size and/or use of iterative reconstruction technique. COMPARISON:  Prior CT from 06/11/2020. FINDINGS: Brain: Cerebral volume within normal limits. Patchy hypodensity involving the supratentorial cerebral white matter, consistent with chronic small vessel ischemic disease, mild for age. No acute intracranial hemorrhage. No acute large vessel territory infarct. No mass lesion or midline shift. No hydrocephalus or extra-axial fluid collection. Small benign arachnoid cyst noted just superior to the tectum. Vascular: No abnormal hyperdense vessel. Skull: Scalp soft tissues demonstrate no acute finding. Calvarium intact. Sinuses/Orbits: Globes orbital soft tissues within normal limits. Visualized paranasal sinuses are clear. No mastoid effusion. Other: None. IMPRESSION: 1. No acute intracranial abnormality. 2. Mild chronic  microvascular ischemic disease for age. Electronically Signed   By: Virgia Griffins M.D.   On: 10/02/2023 19:41   DG Chest Portable 1 View Result Date: 10/02/2023 CLINICAL DATA:  Cough.  Altered mental status. EXAM: PORTABLE CHEST 1 VIEW COMPARISON:  03/09/2023. Chest CT dated 08/19/2023 and chest CTA dated  06/29/2023. FINDINGS: Stable borderline enlarged cardiac silhouette. Less prominent shifting lung densities bilaterally with the exception of mildly increased patchy density in the left lower lobe. No pleural fluid seen. Thoracic spine degenerative changes. IMPRESSION: Less prominent shifting lung densities bilaterally with the exception of mildly increased patchy density in the left lower lobe. This could represent interval pneumonia, including previously suggested atypical pneumonia or inflammatory process. Electronically Signed   By: Catherin Closs M.D.   On: 10/02/2023 18:19    Procedures Procedures    Medications Ordered in ED Medications  azithromycin  (ZITHROMAX ) 500 mg in sodium chloride  0.9 % 250 mL IVPB (has no administration in time range)  cefTRIAXone  (ROCEPHIN ) 2 g in sodium chloride  0.9 % 100 mL IVPB (has no administration in time range)  enoxaparin  (LOVENOX ) injection 40 mg (has no administration in time range)  acetaminophen  (TYLENOL ) tablet 650 mg (has no administration in time range)    Or  acetaminophen  (TYLENOL ) suppository 650 mg (has no administration in time range)  guaiFENesin -dextromethorphan (ROBITUSSIN DM) 100-10 MG/5ML syrup 5 mL (has no administration in time range)  potassium chloride  SA (KLOR-CON  M) CR tablet 40 mEq (has no administration in time range)  calcium  gluconate 1 g/ 50 mL sodium chloride  IVPB (has no administration in time range)  ipratropium-albuterol  (DUONEB) 0.5-2.5 (3) MG/3ML nebulizer solution 3 mL (has no administration in time range)  hydrALAZINE  (APRESOLINE ) injection 5 mg (has no administration in time range)  haloperidol  lactate (HALDOL )  injection 2 mg (2 mg Intravenous Given 10/02/23 1804)  cefTRIAXone  (ROCEPHIN ) 2 g in sodium chloride  0.9 % 100 mL IVPB (0 g Intravenous Stopped 10/02/23 1912)  azithromycin  (ZITHROMAX ) 500 mg in sodium chloride  0.9 % 250 mL IVPB (0 mg Intravenous Stopped 10/02/23 1943)    ED Course/ Medical Decision Making/ A&P                                 Medical Decision Making Amount and/or Complexity of Data Reviewed Labs: ordered. Radiology: ordered.  Risk Prescription drug management. Decision regarding hospitalization.   Patient is alert, afebrile, and hemodynamically stable in the ED in no acute distress.  Physical exam as noted above.  He is noted to be very restless in bed as well, is intermittently verbally redirectable.  Differential for AMS is broad and includes infection, electrolyte derangement, hypoglycemia, hyperglycemia/DKA, ICH, stroke, medication induced encephalopathy, toxin induced encephalopathy, amongst other diagnoses.  Workup resulted with CBC demonstrating leukocytosis of 21.7, VBG with pCO2 44 and pH was 7.41.  Undetectable Tylenol , salicylate, and ethanol levels.  CMP with mild hypokalemia of 3.3, no AKI, no transaminitis.  First troponin WNL.  CT head is unremarkable, I personally interpreted patient's chest x-ray, which demonstrated possible left lower lobe opacity concerning for pneumonia.  Patient was ordered Rocephin  and azithromycin .  Will require admission given his altered mental status in conjunction with his pneumonia.  No oxygen requirement at this time.  Family was updated at bedside.  Handoff was given to admitting team, and patient remained stable.        Final Clinical Impression(s) / ED Diagnoses Final diagnoses:  Altered mental status, unspecified altered mental status type  Pneumonia due to infectious organism, unspecified laterality, unspecified part of lung    Rx / DC Orders ED Discharge Orders     None         Lorain Robson, MD 10/03/23  [facility]    Mozell Arias, MD 10/09/23  1342  

## 2023-10-02 NOTE — ED Triage Notes (Signed)
 Per [facility] pt coming from home- family friend came to check on patient and found him wondering around the house not acting himself. Patient states this is normal when his MS flares up. Patient alert to self and repeatedly stating his birthday when asked what year it is or where he is.

## 2023-10-02 NOTE — H&P (Signed)
 History and Physical    Randy Greene PPI:[id number] DOB: [birth year] DOA: 10/02/2023  PCP: [facility]  Patient coming from: Home  Chief Complaint: AMS  HPI: Randy Greene is a 58 y.o. male with medical history significant of COPD, ILD, CAD, OSA on CPAP, hypertension, MS, trigeminal neuralgia, GERD, aspiration pneumonia, BPH.  He was last admitted to the [facility] in September 2024 for a multifocal bacterial versus COVID pneumonia.  Patient presents to the ED today via EMS from home for evaluation of altered mental status/confusion/restlessness.  Last known well at 5 PM yesterday.  Family reported patient coughing for the past few days.  Vital signs on arrival: Temperature 98 F, pulse 79, respiratory rate 13, blood pressure 187/103, and SpO2 95% on room air.  Labs notable for WBC count 21.7, potassium 3.3, blood cultures collected, UA and UDS pending, troponin negative x 2, ethanol level <10, salicylate level <7.0, acetaminophen level <10.  VBG showing pH 7.41, pCO2 44.2, ionized calcium 1.0.  Chest x-ray showing less prominent shifting lung densities bilaterally with the exception of mildly increased patchy density in the left lower lobe concerning for pneumonia.  CT head negative for acute intracranial abnormality. Patient was given Haldol, ceftriaxone, and azithromycin in the ED.  [facility] called to admit.  Patient is very confused.  Oriented to self only.  He is replying to every question by saying "25 25 25."  Not able to give any history.  Noted to be coughing.  Review of Systems:  Review of Systems  Reason unable to perform ROS: AMS.    Past Medical History:  Diagnosis Date   Acid reflux    Arthritis    Aspiration pneumonia (HCC)    Asthma    COPD (chronic obstructive pulmonary disease) (HCC)    Delayed wound healing    left knee   Dyspnea    Enlarged prostate    Hypercapnic respiratory failure, chronic (HCC)    Hypertension    MS (multiple sclerosis)  (HCC)     Past Surgical History:  Procedure Laterality Date   ARTHROSCOPIC Left    BRONCHIAL WASHINGS  01/14/2021   Procedure: BRONCHIAL WASHINGS;  Surgeon: Josiah Nigh, MD;  Location: [facility] ENDOSCOPY;  Service: Pulmonary;;   HERNIA REPAIR     INCISION AND DRAINAGE Left 01/04/2017   Procedure: INCISION AND DRAINAGE LEFT KNEE;  Surgeon: Adonica Hoose, MD;  Location: [facility] OR;  Service: Orthopedics;  Laterality: Left;   jawsurgery     KNEE ARTHROPLASTY Left 08/10/2016   Procedure: LEFT TOTAL KNEE ARTHROPLASTY WITH COMPUTER NAVIGATION;  Surgeon: Adonica Hoose, MD;  Location: [facility] OR;  Service: Orthopedics;  Laterality: Left;   KNEE ARTHROPLASTY Right 09/30/2022   Procedure: COMPUTER ASSISTED TOTAL KNEE ARTHROPLASTY;  Surgeon: Adonica Hoose, MD;  Location: [facility] ORS;  Service: Orthopedics;  Laterality: Right;  160   knees scope Right    TONSILLECTOMY AND ADENOIDECTOMY     VIDEO BRONCHOSCOPY Bilateral 01/14/2021   Procedure: VIDEO BRONCHOSCOPY WITHOUT FLUORO;  Surgeon: Josiah Nigh, MD;  Location: [facility] ENDOSCOPY;  Service: Pulmonary;  Laterality: Bilateral;     reports that he quit smoking about 16 years ago. His smoking use included cigarettes. He started smoking about 36 years ago. He has a 20 pack-year smoking history. He has never used smokeless tobacco. He reports current alcohol use. He reports that he does not use drugs.  Allergies  Allergen Reactions   Penicillins Hives, Other (See Comments) and Rash    "My  face got flush"   Tape Rash   Zoloft [Sertraline] Rash   Food Nausea And Vomiting and Other (See Comments)    GREEN PEPPERS- flushed  PT DOES NOT EAT MEAT   Morphine Itching   Mushroom Extract Complex (Obsolete) Nausea And Vomiting and Other (See Comments)    And flushed Other reaction(s): Other (See Comments) And flushed   Penicillin G Rash   Pravastatin Other (See Comments)    Family History  Problem Relation Age of Onset   Dementia Mother    Hypertension Father     Heart attack Father     Prior to Admission medications   Medication Sig Start Date End Date Taking? Authorizing Provider  acetaminophen-codeine (TYLENOL #3) 300-30 MG tablet Take 1 tablet by mouth 2 (two) times daily as needed for moderate pain. 05/06/22   [provider]  albuterol (PROVENTIL HFA;VENTOLIN HFA) 108 (90 BASE) MCG/ACT inhaler Inhale 2 puffs into the lungs every 6 (six) hours as needed for wheezing or shortness of breath.    [provider]  alfuzosin (UROXATRAL) 10 MG 24 hr tablet Take 10 mg by mouth every evening.    [provider]  amphetamine-dextroamphetamine (ADDERALL XR) 30 MG 24 hr capsule Take 30 mg by mouth 2 (two) times daily. 09/30/21   [provider]  aspirin EC 81 MG tablet Take by mouth. 10/12/22   [provider]  B Complex Vitamins (VITAMIN B COMPLEX W/B-12) TABS VITAMIN B COMPLEX W/B-12 TAB                                      Non-[facility]                                                                      TAKE ONE TABLET BY MOUTH DAILY  Sep 04, 2008                                        Non-[facility]                                                                                                          Documented by:  ST MARTIN,CHARLES D                                    Documented at:  [facility] 09/04/08   [provider]  baclofen (LIORESAL) 20 MG tablet Take 20 mg by mouth 4 (four) times daily. 09/23/22   [provider]  calcium carbonate (OS-CAL) 1250 (500 Ca) MG chewable tablet  Chew 1 tablet by mouth 2 (two) times daily. 07/24/21   [provider]  carboxymethylcellulose 1 % ophthalmic solution Place 1 drop into both eyes as needed (dry eyes).    [provider]  cefdinir (OMNICEF) 300 MG capsule Take 300 mg by mouth 2 (two) times daily.    [provider]  Cholecalciferol (VITAMIN D) 50 MCG (2000 UT) tablet Take 6,000 Units by mouth daily.    [provider]   cyanocobalamin (,VITAMIN B-12,) 1000 MCG/ML injection Inject 1,000 mcg into the muscle every 28 (twenty-eight) days. 02/04/21   [provider]  cyclobenzaprine (FLEXERIL) 10 MG tablet Take 10 mg by mouth daily as needed (severe muscle spasms). 06/28/22   [provider]  diclofenac Sodium (VOLTAREN) 1 % GEL Apply 2 g topically 2 (two) times daily as needed (pain). 11/27/19   [provider]  dronabinol (MARINOL) 2.5 MG capsule Take 5 mg by mouth 3 (three) times daily.    [provider]  esomeprazole (NEXIUM) 40 MG capsule Take 40 mg by mouth 2 (two) times daily before a meal.    [provider]  famotidine (PEPCID) 40 MG tablet Take 40 mg by mouth at bedtime.    [provider]  finasteride (PROSCAR) 5 MG tablet Take 5 mg by mouth in the morning.    [provider]  fluorouracil (EFUDEX) 5 % cream Apply 1 application  topically daily as needed (brown spots on forehead).    [provider]  fluticasone (FLONASE) 50 MCG/ACT nasal spray Place 1 spray into both nostrils daily.    [provider]  ketotifen (ZADITOR) 0.025 % ophthalmic solution Place 1 drop into both eyes 2 (two) times daily as needed (itching).    [provider]  LACTOBACILLUS PO Take 1 capsule by mouth every morning. 06/29/22   [provider]  magnesium (MAGTAB) 84 MG ( ) TBCR SR tablet Take 42 mg by mouth 2 (two) times daily. 07/24/21   [provider]  meloxicam (MOBIC) 15 MG tablet Take 15 mg by mouth daily. For knee pain 01/05/23   [provider]  metoCLOPramide (REGLAN) 10 MG tablet Take 10 mg by mouth every evening. 07/24/21   [provider]  Mometasone Furoate 100 MCG/ACT AERO Take 1 spray by mouth every morning. 10/23/22   [provider]  montelukast (SINGULAIR) 10 MG tablet Take 10 mg by mouth at bedtime.    [provider]  naloxone [employer]) nasal spray 4 mg/0.1 mL Place 1 spray into the  nose See admin instructions. SPRAY 1 SPRAY INTO ONE NOSTRIL AS DIRECTED FOR OPIOID OVERDOSE (TURN PERSON ON SIDE AFTER DOSE. IF NO RESPONSE IN 2-3 MINUTES OR PERSON RESPONDS BUT RELAPSES, REPEAT USING A NEW SPRAY DEVICE AND SPRAY INTO THE OTHER NOSTRIL. CALL 911 AFTER USE.) * EMERGENCY USE ONLY * 01/16/20   [provider]  Ofatumumab 20 MG/0.4ML SOAJ Inject 20 mg into the skin every 30 (thirty) days. Kesimpta 11/20/22   [provider]  Omega-3 Fatty Acids (FISH OIL) 1000 MG CAPS Take 1,000 mg by mouth in the morning, at noon, and at bedtime.    [provider]  phenytoin (DILANTIN) 100 MG ER capsule Take 100 mg by mouth 2 (two) times daily. 07/18/20   [provider]  predniSONE (DELTASONE) 10 MG tablet Take 40 mg daily for 3 days, 30 mg daily for 3 days, 20 mg daily for 2 days,10 mg daily for 1 days, then stop 03/12/23  Ghimire, Estil Heman, MD  pregabalin (LYRICA) 150 MG capsule Take 150-300 mg by mouth See admin instructions. Take 1 capsule by mouth twice a day, then take 2 capsules in the evening 11/12/22   [provider]  Probiotic Product (ALIGN) 4 MG CAPS Take 4 mg by mouth in the morning and at bedtime.    [provider]  salicyclic acid-sulfur (SEBULEX) 2-2 % shampoo Apply 1 application  topically daily as needed for itching. 07/13/22   [provider]  Tiotropium Bromide Monohydrate (SPIRIVA RESPIMAT) 2.5 MCG/ACT AERS Inhale 1 each into the lungs daily.    [provider]  traMADol (ULTRAM) 50 MG tablet Take 50 mg by mouth daily as needed for moderate pain. 07/14/22   [provider]  urea (CARMOL) 20 % cream Apply 1 application  topically daily as needed (for bottom of feet). 10/12/22   [provider]  vitamin C (ASCORBIC ACID) 500 MG tablet Take 500 mg by mouth See admin instructions. Take 1 tablet by mouth every 2 days    [provider]  vitamin E 180 MG (400 UNITS) capsule Take 400 Units by mouth  daily.    [provider]  zinc sulfate 220 (50 Zn) MG capsule Take 220 mg by mouth every other day. 06/28/22   [provider]    Physical Exam: Vitals:   10/02/23 1839 10/02/23 2015 10/02/23 2030 10/02/23 2200  BP: (!) 159/91  (!) 168/83 (!) 169/78  Pulse:  86 88 84  Resp: 13 16 15 13   Temp:    99.3 F (37.4 C)  TempSrc:    Axillary  SpO2:  98%  100%  Weight:      Height:        Physical Exam Vitals reviewed.  Constitutional:      General: He is not in acute distress. HENT:     Head: Normocephalic and atraumatic.  Eyes:     Extraocular Movements: Extraocular movements intact.  Cardiovascular:     Rate and Rhythm: Normal rate and regular rhythm.     Pulses: Normal pulses.  Pulmonary:     Effort: Pulmonary effort is normal. No respiratory distress.     Breath sounds: No wheezing.  Abdominal:     General: Bowel sounds are normal. There is no distension.     Palpations: Abdomen is soft.     Tenderness: There is no abdominal tenderness. There is no guarding.  Musculoskeletal:     Cervical back: Normal range of motion.     Right lower leg: No edema.     Left lower leg: No edema.  Skin:    General: Skin is warm and dry.  Neurological:     General: No focal deficit present.     Mental Status: He is alert.     Cranial Nerves: No cranial nerve deficit.     Sensory: No sensory deficit.     Motor: No weakness.     Comments: Very confused and disoriented, however, does follow commands appropriately and has no focal neurodeficit on exam.     Labs on Admission: I have personally reviewed following labs and imaging studies  CBC: Recent Labs  Lab 10/02/23 1740 10/02/23 1746  WBC 21.7*  --   NEUTROABS 18.3*  --   HGB 16.5 17.0  HCT 49.5 50.0  MCV 94.3  --   PLT 244  --    Basic Metabolic Panel: Recent Labs  Lab 10/02/23 1746 10/02/23 2049  NA 140  142  K 4.5 3.3*  CL  --  109  CO2  --  23  GLUCOSE  --  114*  BUN  --  9  CREATININE  --  0.65   CALCIUM  --  8.5*   GFR: Estimated Creatinine Clearance: 115.6 mL/min (by C-G formula based on SCr of 0.65 mg/dL). Liver Function Tests: Recent Labs  Lab 10/02/23 2049  AST 18  ALT 15  ALKPHOS 69  BILITOT 0.5  PROT 6.2*  ALBUMIN 3.5   No results for input(s): "LIPASE", "AMYLASE" in the last 168 hours. No results for input(s): "AMMONIA" in the last 168 hours. Coagulation Profile: No results for input(s): "INR", "PROTIME" in the last 168 hours. Cardiac Enzymes: No results for input(s): "CKTOTAL", "CKMB", "CKMBINDEX", "TROPONINI" in the last 168 hours. BNP (last 3 results) No results for input(s): "PROBNP" in the last 8760 hours. HbA1C: No results for input(s): "HGBA1C" in the last 72 hours. CBG: No results for input(s): "GLUCAP" in the last 168 hours. Lipid Profile: No results for input(s): "CHOL", "HDL", "LDLCALC", "TRIG", "CHOLHDL", "LDLDIRECT" in the last 72 hours. Thyroid Function Tests: No results for input(s): "TSH", "T4TOTAL", "FREET4", "T3FREE", "THYROIDAB" in the last 72 hours. Anemia Panel: No results for input(s): "VITAMINB12", "FOLATE", "FERRITIN", "TIBC", "IRON", "RETICCTPCT" in the last 72 hours. Urine analysis:    Component Value Date/Time   COLORURINE YELLOW 03/09/2023 0800   APPEARANCEUR CLEAR 03/09/2023 0800   LABSPEC 1.014 03/09/2023 0800   PHURINE 5.0 03/09/2023 0800   GLUCOSEU NEGATIVE 03/09/2023 0800   HGBUR NEGATIVE 03/09/2023 0800   BILIRUBINUR NEGATIVE 03/09/2023 0800   KETONESUR NEGATIVE 03/09/2023 0800   PROTEINUR NEGATIVE 03/09/2023 0800   UROBILINOGEN 0.2 03/01/2014 0258   NITRITE NEGATIVE 03/09/2023 0800   LEUKOCYTESUR NEGATIVE 03/09/2023 0800    Radiological Exams on Admission: CT Head Wo Contrast Result Date: 10/02/2023 CLINICAL DATA:  Initial evaluation for acute altered mental status. EXAM: CT HEAD WITHOUT CONTRAST TECHNIQUE: Contiguous axial images were obtained from the base of the skull through the vertex without intravenous  contrast. RADIATION DOSE REDUCTION: This exam was performed according to the departmental dose-optimization program which includes automated exposure control, adjustment of the mA and/or kV according to patient size and/or use of iterative reconstruction technique. COMPARISON:  Prior CT from 06/11/2020. FINDINGS: Brain: Cerebral volume within normal limits. Patchy hypodensity involving the supratentorial cerebral white matter, consistent with chronic small vessel ischemic disease, mild for age. No acute intracranial hemorrhage. No acute large vessel territory infarct. No mass lesion or midline shift. No hydrocephalus or extra-axial fluid collection. Small benign arachnoid cyst noted just superior to the tectum. Vascular: No abnormal hyperdense vessel. Skull: Scalp soft tissues demonstrate no acute finding. Calvarium intact. Sinuses/Orbits: Globes orbital soft tissues within normal limits. Visualized paranasal sinuses are clear. No mastoid effusion. Other: None. IMPRESSION: 1. No acute intracranial abnormality. 2. Mild chronic microvascular ischemic disease for age. Electronically Signed   By: Virgia Griffins M.D.   On: 10/02/2023 19:41   DG Chest Portable 1 View Result Date: 10/02/2023 CLINICAL DATA:  Cough.  Altered mental status. EXAM: PORTABLE CHEST 1 VIEW COMPARISON:  03/09/2023. Chest CT dated 08/19/2023 and chest CTA dated 06/29/2023. FINDINGS: Stable borderline enlarged cardiac silhouette. Less prominent shifting lung densities bilaterally with the exception of mildly increased patchy density in the left lower lobe. No pleural fluid seen. Thoracic spine degenerative changes. IMPRESSION: Less prominent shifting lung densities bilaterally with the exception of mildly increased patchy density in the left lower lobe. This could  represent interval pneumonia, including previously suggested atypical pneumonia or inflammatory process. Electronically Signed   By: Catherin Closs M.D.   On: 10/02/2023 18:19     EKG: Independently reviewed.  Sinus rhythm, LVH.  No significant change compared to previous EKGs.  Assessment and Plan  Community-acquired pneumonia Has leukocytosis on labs but does not meet any other SIRS criteria at this time.  Not hypoxic.  Continue ceftriaxone and azithromycin, antitussive as needed, bronchodilator as needed.  SARS-CoV-2 PCR and respiratory viral panel ordered.  Strep pneumo and Legionella urinary antigens.  Sputum Gram stain and culture.  Trend WBC count and follow-up blood cultures.  Acute metabolic encephalopathy Although pneumonia could be contributing, patient is very confused and disoriented and other etiologies need to be ruled out.  CT head negative for acute intracranial abnormality and no focal neurodeficit on exam.  UA and UDS pending.  Also check TSH, B12, and ammonia level.  If metabolic workup negative and not improving, then order brain MRI.  Mild hypokalemia Monitor potassium and magnesium levels, continue to replace as needed.  Mild hypocalcemia Replace calcium and continue to monitor labs.  OSA Continue nightly CPAP.  Hypertension SBP currently in the 150s.  Pharmacy med rec pending.  IV hydralazine PRN SBP >160.  COPD: Stable, no signs of acute exacerbation.  DuoNeb as needed. CAD: Workup not suggestive of ACS. MS Trigeminal neuralgia GERD BPH Pharmacy med rec pending.  DVT prophylaxis: Lovenox Code Status: Full code by default.  Patient does not have capacity for decision-making, no surrogate or prior directive available. Family Communication: No family available at this time. Level of care: Telemetry bed Admission status: It is my clinical opinion that referral for OBSERVATION is reasonable and necessary in this patient based on the above information provided. The aforementioned taken together are felt to place the patient at high risk for further clinical deterioration. However, it is anticipated that the patient may be medically  stable for discharge from the hospital within 24 to 48 hours.  Juliette Oh MD [facility]  If 7PM-7AM, please contact night-coverage [email or web address]  10/02/2023, 10:49 PM

## 2023-10-02 NOTE — ED Notes (Signed)
 This RN called [facility] to verify that room is ready, they stated room was ready.

## 2023-10-02 NOTE — ED Notes (Signed)
 Patient's girlfriend Mariah Shines was called and updated by this RN.

## 2023-10-03 DIAGNOSIS — E876 Hypokalemia: Secondary | ICD-10-CM

## 2023-10-03 DIAGNOSIS — J449 Chronic obstructive pulmonary disease, unspecified: Secondary | ICD-10-CM | POA: Diagnosis not present

## 2023-10-03 DIAGNOSIS — G9341 Metabolic encephalopathy: Secondary | ICD-10-CM

## 2023-10-03 DIAGNOSIS — J189 Pneumonia, unspecified organism: Secondary | ICD-10-CM | POA: Diagnosis not present

## 2023-10-03 LAB — URINALYSIS, W/ REFLEX TO CULTURE (INFECTION SUSPECTED)
Bilirubin Urine: NEGATIVE
Glucose, UA: NEGATIVE mg/dL
Hgb urine dipstick: NEGATIVE
Ketones, ur: NEGATIVE mg/dL
Leukocytes,Ua: NEGATIVE
Nitrite: NEGATIVE
Protein, ur: NEGATIVE mg/dL
Specific Gravity, Urine: 1.015 (ref 1.005–1.030)
pH: 7 (ref 5.0–8.0)

## 2023-10-03 LAB — VITAMIN B12: Vitamin B-12: 1137 pg/mL — ABNORMAL HIGH (ref 180–914)

## 2023-10-03 LAB — BASIC METABOLIC PANEL WITH GFR
Anion gap: 13 (ref 5–15)
BUN: 8 mg/dL (ref 6–20)
CO2: 21 mmol/L — ABNORMAL LOW (ref 22–32)
Calcium: 9.2 mg/dL (ref 8.9–10.3)
Chloride: 102 mmol/L (ref 98–111)
Creatinine, Ser: 0.7 mg/dL (ref 0.61–1.24)
GFR, Estimated: >60 mL/min (ref >60)
Glucose, Bld: 99 mg/dL (ref 70–99)
Potassium: 4.1 mmol/L (ref 3.5–5.1)
Sodium: 136 mmol/L (ref 135–145)

## 2023-10-03 LAB — RESPIRATORY PANEL BY PCR

## 2023-10-03 LAB — RAPID URINE DRUG SCREEN, HOSP PERFORMED
Amphetamines: NOT DETECTED
Barbiturates: NOT DETECTED
Benzodiazepines: NOT DETECTED
Cocaine: NOT DETECTED
Opiates: POSITIVE — AB
Tetrahydrocannabinol: POSITIVE — AB

## 2023-10-03 LAB — CBC
HCT: 46.2 % (ref 39.0–52.0)
Hemoglobin: 15.3 g/dL (ref 13.0–17.0)
MCH: 31.2 pg (ref 26.0–34.0)
MCHC: 33.1 g/dL (ref 30.0–36.0)
MCV: 94.3 fL (ref 80.0–100.0)
Platelets: 208 10*3/uL (ref 150–400)
RBC: 4.9 MIL/uL (ref 4.22–5.81)
RDW: 14 % (ref 11.5–15.5)
WBC: 11.8 10*3/uL — ABNORMAL HIGH (ref 4.0–10.5)
nRBC: 0 % (ref 0.0–0.2)

## 2023-10-03 LAB — MAGNESIUM: Magnesium: 2 mg/dL (ref 1.7–2.4)

## 2023-10-03 LAB — AMMONIA: Ammonia: 23 umol/L (ref 9–35)

## 2023-10-03 LAB — SARS CORONAVIRUS 2 BY RT PCR: SARS Coronavirus 2 by RT PCR: NEGATIVE

## 2023-10-03 LAB — TSH: TSH: 1.29 u[IU]/mL (ref 0.350–4.500)

## 2023-10-03 MED ORDER — LEVOFLOXACIN 750 MG PO TABS: 750.0000 mg | ORAL_TABLET | ORAL | Status: AC

## 2023-10-03 MED ORDER — GUAIFENESIN-DM 100-10 MG/5ML PO SYRP: 5.0000 mL | ORAL_SOLUTION | ORAL | 0 refills | Status: AC | PRN

## 2023-10-03 MED ORDER — LEVOFLOXACIN 750 MG PO TABS
750.0000 mg | ORAL_TABLET | ORAL | Status: DC
  Filled 2023-10-03 (×2): qty 1

## 2023-10-03 MED ORDER — LEVOFLOXACIN 750 MG PO TABS
750.0000 mg | ORAL_TABLET | Freq: Every day | ORAL | Status: DC
  Administered 2023-10-03: 750 mg via ORAL
  Filled 2023-10-03: qty 1

## 2023-10-03 NOTE — Discharge Summary (Signed)
 Physician Discharge Summary   Patient: Randy Greene MRN: [id number] DOB: [birth year]  Admit date:     10/02/2023  Discharge date: 10/03/23  Discharge Physician: Luna Salinas   PCP: [facility]   Recommendations at discharge:  Please obtain CBC and BMP on follow-up Please ensure completion of antibiotics Follow-up with primary care provider within a week  Discharge Diagnoses: Principal Problem:   CAP (community acquired pneumonia) Active Problems:   Hypokalemia   COPD (chronic obstructive pulmonary disease) (HCC)   Acute metabolic encephalopathy   Hypocalcemia   Hospital Course: Taken from H&P.  Jamair Cato Hanline is a 58 y.o. male with medical history significant of COPD, ILD, CAD, OSA on CPAP, hypertension, MS, trigeminal neuralgia, GERD, aspiration pneumonia, BPH.  He was last admitted to the [facility] health system in September 2024 for a multifocal bacterial versus COVID pneumonia.  Patient presents to the ED today via EMS from home for evaluation of altered mental status/confusion/restlessness.  Patient was coughing for the past few days.  On presentation elevated blood pressure at 187/109, otherwise stable vitals, on room air.  Labs pertinent for leukocytosis at 21.7, potassium 3.3, troponin negative x 2, toxicology screen negative,Chest x-ray showing less prominent shifting lung densities bilaterally with the exception of mildly increased patchy density in the left lower lobe concerning for pneumonia. CT head negative for acute intracranial abnormality.   Started on ceftriaxone and azithromycin.  4/13: Afebrile with mildly elevated blood pressure at 162/102.  Respiratory panel including expanded respiratory panel negative.  UA negative.  Ammonia and B12 normal.  Magnesium and TSH normal.  Patient apparently had a CT chest done last month which was also concern for few bilateral densities concerning for inflammation/infection.  Per patient he never received any  antibiotics.  Patient was at baseline, does not need any oxygen.  Mild cough.  Patient took his tramadol and Tylenol with codeine yesterday due to some worsening pain.  Might be the cause of his confusion as he does not remember anything.  Patient is being discharged on 5 days of Levaquin.  He will continue with rest of his home medications and need to have a close follow-up with his providers for further recommendations.   Consultants: None Procedures performed: None Disposition: Home Diet recommendation:  Discharge Diet Orders (From admission, onward)     Start     Ordered   10/03/23 0000  Diet - low sodium heart healthy        10/03/23 1517           Regular diet DISCHARGE MEDICATION: Allergies as of 10/03/2023       Reactions   Penicillins Hives, Other (See Comments), Rash   "My face got flush"   Tape Rash   Zoloft [sertraline] Rash   Food Nausea And Vomiting, Other (See Comments)   GREEN PEPPERS- flushed PT DOES NOT EAT MEAT   Morphine Itching   Mushroom Extract Complex (obsolete) Nausea And Vomiting, Other (See Comments)   And flushed Other reaction(s): Other (See Comments) And flushed   Penicillin G Rash   Pravastatin Other (See Comments)        Medication List     STOP taking these medications    cyclobenzaprine 10 MG tablet Commonly known as: FLEXERIL       TAKE these medications    acetaminophen-codeine 300-30 MG tablet Commonly known as: TYLENOL #3 Take 1 tablet by mouth 2 (two) times daily as needed for moderate pain.   albuterol 108 (90  Base) MCG/ACT inhaler Commonly known as: VENTOLIN HFA Inhale 2 puffs into the lungs every 6 (six) hours as needed for wheezing or shortness of breath.   alfuzosin 10 MG 24 hr tablet Commonly known as: UROXATRAL Take 10 mg by mouth every evening.   amphetamine-dextroamphetamine 30 MG 24 hr capsule Commonly known as: ADDERALL XR Take 30 mg by mouth 2 (two) times daily.   ascorbic acid 500 MG  tablet Commonly known as: VITAMIN C Take 500 mg by mouth See admin instructions. Take 1 tablet by mouth every 2 days   aspirin EC 81 MG tablet Take 81 mg by mouth daily.   baclofen 20 MG tablet Commonly known as: LIORESAL Take 20 mg by mouth 4 (four) times daily.   calcium carbonate 1250 (500 Ca) MG chewable tablet Commonly known as: OS-CAL Chew 1 tablet by mouth 2 (two) times daily.   carboxymethylcellulose 1 % ophthalmic solution Place 1 drop into both eyes as needed (dry eyes).   cyanocobalamin 1000 MCG/ML injection Commonly known as: VITAMIN B12 Inject 1,000 mcg into the muscle every 28 (twenty-eight) days.   donepezil 10 MG tablet Commonly known as: ARICEPT Take 10 mg by mouth at bedtime.   esomeprazole 40 MG capsule Commonly known as: NEXIUM Take 40 mg by mouth 2 (two) times daily before a meal.   ezetimibe 10 MG tablet Commonly known as: ZETIA Take 10 mg by mouth daily.   famotidine 40 MG tablet Commonly known as: PEPCID Take 40 mg by mouth at bedtime.   finasteride 5 MG tablet Commonly known as: PROSCAR Take 5 mg by mouth in the morning.   Fish Oil 1000 MG Caps Take 1,000 mg by mouth in the morning, at noon, and at bedtime.   fluticasone 50 MCG/ACT nasal spray Commonly known as: FLONASE Place 1 spray into both nostrils daily.   guaiFENesin-dextromethorphan 100-10 MG/5ML syrup Commonly known as: ROBITUSSIN DM Take 5 mLs by mouth every 4 (four) hours as needed for cough.   ketotifen 0.025 % ophthalmic solution Commonly known as: ZADITOR Place 1 drop into both eyes 2 (two) times daily as needed (itching).   levofloxacin 750 MG tablet Commonly known as: LEVAQUIN Take 1 tablet (750 mg total) by mouth daily for 5 days.   magnesium 84 MG ( ) Tbcr SR tablet Commonly known as: MAGTAB Take 42 mg by mouth 2 (two) times daily.   meloxicam 15 MG tablet Commonly known as: MOBIC Take 15 mg by mouth daily. For knee pain   metoCLOPramide 10 MG  tablet Commonly known as: REGLAN Take 10 mg by mouth every evening.   montelukast 10 MG tablet Commonly known as: SINGULAIR Take 10 mg by mouth at bedtime.   naloxone 4 MG/0.1ML Liqd nasal spray kit Commonly known as: NARCAN Place 1 spray into the nose See admin instructions. SPRAY 1 SPRAY INTO ONE NOSTRIL AS DIRECTED FOR OPIOID OVERDOSE (TURN PERSON ON SIDE AFTER DOSE. IF NO RESPONSE IN 2-3 MINUTES OR PERSON RESPONDS BUT RELAPSES, REPEAT USING A NEW SPRAY DEVICE AND SPRAY INTO THE OTHER NOSTRIL. CALL 911 AFTER USE.) * EMERGENCY USE ONLY *   Ofatumumab 20 MG/0.4ML Soaj Inject 20 mg into the skin every 30 (thirty) days. Kesimpta   phenytoin 100 MG ER capsule Commonly known as: DILANTIN Take 100 mg by mouth 2 (two) times daily.   pregabalin 150 MG capsule Commonly known as: LYRICA Take 150-300 mg by mouth See admin instructions. Take 1 capsule by mouth twice a day, then take 2  capsules in the evening   salicyclic acid-sulfur 2-2 % shampoo Commonly known as: SEBULEX Apply 1 application  topically daily as needed for itching.   Spiriva Respimat 2.5 MCG/ACT Aers Generic drug: Tiotropium Bromide Monohydrate Inhale 1 each into the lungs daily.   traMADol 50 MG tablet Commonly known as: ULTRAM Take 50 mg by mouth daily as needed for moderate pain.   Vitamin D 50 MCG (2000 UT) tablet Take 6,000 Units by mouth daily.   vitamin E 180 MG (400 UNITS) capsule Take 400 Units by mouth daily.   zinc sulfate (50mg  elemental zinc) 220 (50 Zn) MG capsule Take 220 mg by mouth every other day.        Discharge Exam: Filed Weights   10/02/23 1705  Weight: 91.2 kg   General.  Well-developed gentleman, in no acute distress. Pulmonary.  Lungs clear bilaterally, normal respiratory effort. CV.  Regular rate and rhythm, no JVD, rub or murmur. Abdomen.  Soft, nontender, nondistended, BS positive. CNS.  Alert and oriented .  No focal neurologic deficit. Extremities.  No edema, no cyanosis,  pulses intact and symmetrical. Psychiatry.  Judgment and insight appears normal.   Condition at discharge: stable  The results of significant diagnostics from this hospitalization (including imaging, microbiology, ancillary and laboratory) are listed below for reference.   Imaging Studies: CT Head Wo Contrast Result Date: 10/02/2023 CLINICAL DATA:  Initial evaluation for acute altered mental status. EXAM: CT HEAD WITHOUT CONTRAST TECHNIQUE: Contiguous axial images were obtained from the base of the skull through the vertex without intravenous contrast. RADIATION DOSE REDUCTION: This exam was performed according to the departmental dose-optimization program which includes automated exposure control, adjustment of the mA and/or kV according to patient size and/or use of iterative reconstruction technique. COMPARISON:  Prior CT from 06/11/2020. FINDINGS: Brain: Cerebral volume within normal limits. Patchy hypodensity involving the supratentorial cerebral white matter, consistent with chronic small vessel ischemic disease, mild for age. No acute intracranial hemorrhage. No acute large vessel territory infarct. No mass lesion or midline shift. No hydrocephalus or extra-axial fluid collection. Small benign arachnoid cyst noted just superior to the tectum. Vascular: No abnormal hyperdense vessel. Skull: Scalp soft tissues demonstrate no acute finding. Calvarium intact. Sinuses/Orbits: Globes orbital soft tissues within normal limits. Visualized paranasal sinuses are clear. No mastoid effusion. Other: None. IMPRESSION: 1. No acute intracranial abnormality. 2. Mild chronic microvascular ischemic disease for age. Electronically Signed   By: Virgia Griffins M.D.   On: 10/02/2023 19:41   DG Chest Portable 1 View Result Date: 10/02/2023 CLINICAL DATA:  Cough.  Altered mental status. EXAM: PORTABLE CHEST 1 VIEW COMPARISON:  03/09/2023. Chest CT dated 08/19/2023 and chest CTA dated 06/29/2023. FINDINGS: Stable  borderline enlarged cardiac silhouette. Less prominent shifting lung densities bilaterally with the exception of mildly increased patchy density in the left lower lobe. No pleural fluid seen. Thoracic spine degenerative changes. IMPRESSION: Less prominent shifting lung densities bilaterally with the exception of mildly increased patchy density in the left lower lobe. This could represent interval pneumonia, including previously suggested atypical pneumonia or inflammatory process. Electronically Signed   By: Catherin Closs M.D.   On: 10/02/2023 18:19    Microbiology: Results for orders placed or performed during the hospital encounter of 10/02/23  Blood culture (routine x 2)     Status: None (Preliminary result)   Collection Time: 10/02/23  5:30 PM   Specimen: BLOOD LEFT HAND  Result Value Ref Range Status   Specimen Description BLOOD LEFT HAND  Final   Special Requests   Final    BOTTLES DRAWN AEROBIC AND ANAEROBIC Blood Culture results may not be optimal due to an inadequate volume of blood received in culture bottles   Culture   Final    NO GROWTH < 24 HOURS Performed at [facility], [address] [place], [place] [address]    Report Status PENDING  Incomplete  Blood culture (routine x 2)     Status: None (Preliminary result)   Collection Time: 10/02/23  5:30 PM   Specimen: BLOOD RIGHT HAND  Result Value Ref Range Status   Specimen Description BLOOD RIGHT HAND  Final   Special Requests   Final    BOTTLES DRAWN AEROBIC ONLY Blood Culture results may not be optimal due to an inadequate volume of blood received in culture bottles   Culture   Final    NO GROWTH < 24 HOURS Performed at [facility], [address], [place], [place] [address]    Report Status PENDING  Incomplete  Respiratory (~20 pathogens) panel by PCR     Status: None   Collection Time: 10/02/23 11:52 PM   Specimen: Nasopharyngeal Swab; Respiratory  Result Value Ref Range Status   Adenovirus NOT DETECTED  NOT DETECTED Final   Coronavirus 229E NOT DETECTED NOT DETECTED Final    Comment: (NOTE) The Coronavirus on the Respiratory Panel, DOES NOT test for the novel  Coronavirus (2019 nCoV)    Coronavirus HKU1 NOT DETECTED NOT DETECTED Final   Coronavirus NL63 NOT DETECTED NOT DETECTED Final   Coronavirus OC43 NOT DETECTED NOT DETECTED Final   Metapneumovirus NOT DETECTED NOT DETECTED Final   Rhinovirus / Enterovirus NOT DETECTED NOT DETECTED Final   Influenza A NOT DETECTED NOT DETECTED Final   Influenza B NOT DETECTED NOT DETECTED Final   Parainfluenza Virus 1 NOT DETECTED NOT DETECTED Final   Parainfluenza Virus 2 NOT DETECTED NOT DETECTED Final   Parainfluenza Virus 3 NOT DETECTED NOT DETECTED Final   Parainfluenza Virus 4 NOT DETECTED NOT DETECTED Final   Respiratory Syncytial Virus NOT DETECTED NOT DETECTED Final   Bordetella pertussis NOT DETECTED NOT DETECTED Final   Bordetella Parapertussis NOT DETECTED NOT DETECTED Final   Chlamydophila pneumoniae NOT DETECTED NOT DETECTED Final   Mycoplasma pneumoniae NOT DETECTED NOT DETECTED Final    Comment: Performed at [facility], [address] [place], [place] [address]  SARS Coronavirus 2 by RT PCR (hospital order, performed in [facility] hospital lab) *cepheid single result test*     Status: None   Collection Time: 10/03/23  1:14 AM  Result Value Ref Range Status   SARS Coronavirus 2 by RT PCR NEGATIVE NEGATIVE Final    Comment: Performed at [facility], [address], [place], [place] [address]    Labs: CBC: Recent Labs  Lab 10/02/23 1740 10/02/23 1746 10/03/23 1124  WBC 21.7*  --  11.8*  NEUTROABS 18.3*  --   --   HGB 16.5 17.0 15.3  HCT 49.5 50.0 46.2  MCV 94.3  --  94.3  PLT 244  --  208   Basic Metabolic Panel: Recent Labs  Lab 10/02/23 1746 10/02/23 2049 10/03/23 1124  NA 140 142 136  K 4.5 3.3* 4.1  CL  --  109 102  CO2  --  23 21*  GLUCOSE  --  114* 99  BUN  --  9 8  CREATININE  --  0.65  0.70  CALCIUM  --  8.5* 9.2  MG  --   --  2.0   Liver Function Tests: Recent Labs  Lab 10/02/23 2049  AST 18  ALT 15  ALKPHOS 69  BILITOT 0.5  PROT 6.2*  ALBUMIN 3.5   CBG: No results for input(s): "GLUCAP" in the last 168 hours.  Discharge time spent: greater than 30 minutes.  This record has been created using Conservation officer, historic buildings. Errors have been sought and corrected,but may not always be located. Such creation errors do not reflect on the standard of care.   Signed: Luna Salinas, MD [facility] 10/03/2023

## 2023-10-03 NOTE — Hospital Course (Addendum)
 Taken from H&P.  Randy Greene is a 58 y.o. male with medical history significant of COPD, ILD, CAD, OSA on CPAP, hypertension, MS, trigeminal neuralgia, GERD, aspiration pneumonia, BPH.  He was last admitted to the [facility] health system in September 2024 for a multifocal bacterial versus COVID pneumonia.  Patient presents to the ED today via EMS from home for evaluation of altered mental status/confusion/restlessness.  Patient was coughing for the past few days.  On presentation elevated blood pressure at 187/109, otherwise stable vitals, on room air.  Labs pertinent for leukocytosis at 21.7, potassium 3.3, troponin negative x 2, toxicology screen negative,Chest x-ray showing less prominent shifting lung densities bilaterally with the exception of mildly increased patchy density in the left lower lobe concerning for pneumonia. CT head negative for acute intracranial abnormality.   Started on ceftriaxone and azithromycin.  4/13: Afebrile with mildly elevated blood pressure at 162/102.  Respiratory panel including expanded respiratory panel negative.  UA negative.  Ammonia and B12 normal.  Magnesium and TSH normal.  Patient apparently had a CT chest done last month which was also concern for few bilateral densities concerning for inflammation/infection.  Per patient he never received any antibiotics.  Patient was at baseline, does not need any oxygen.  Mild cough.  Patient took his tramadol and Tylenol with codeine yesterday due to some worsening pain.  Might be the cause of his confusion as he does not remember anything.  Patient is being discharged on 5 days of Levaquin.  He will continue with rest of his home medications and need to have a close follow-up with his providers for further recommendations.

## 2023-10-04 LAB — CALCIUM, IONIZED: Calcium, Ionized, Serum: 5 mg/dL (ref 4.5–5.6)

## 2023-10-05 LAB — LEGIONELLA PNEUMOPHILA SEROGP 1 UR AG: L. pneumophila Serogp 1 Ur Ag: NEGATIVE

## 2023-10-07 LAB — CULTURE, BLOOD (ROUTINE X 2)
Culture: NO GROWTH
Culture: NO GROWTH

## 2023-12-01 ENCOUNTER — Ambulatory Visit (HOSPITAL_BASED_OUTPATIENT_CLINIC_OR_DEPARTMENT_OTHER): Admitting: Nurse Practitioner

## 2023-12-01 ENCOUNTER — Encounter (HOSPITAL_BASED_OUTPATIENT_CLINIC_OR_DEPARTMENT_OTHER): Payer: Self-pay | Admitting: Nurse Practitioner

## 2023-12-01 VITALS — BP 130/81 | HR 102 | Ht 74.0 in | Wt 210.0 lb

## 2023-12-01 DIAGNOSIS — J449 Chronic obstructive pulmonary disease, unspecified: Secondary | ICD-10-CM

## 2023-12-01 DIAGNOSIS — J8489 Other specified interstitial pulmonary diseases: Secondary | ICD-10-CM | POA: Diagnosis not present

## 2023-12-01 DIAGNOSIS — R9389 Abnormal findings on diagnostic imaging of other specified body structures: Secondary | ICD-10-CM

## 2023-12-01 DIAGNOSIS — R918 Other nonspecific abnormal finding of lung field: Secondary | ICD-10-CM

## 2023-12-01 NOTE — Progress Notes (Signed)
 @Patient  ID: Randy Greene, male    DOB: [birth year], 58 y.o.   MRN: [id number]  Chief Complaint  Patient presents with   Follow-up    Lung mass    Referring provider: [facility]  HPI: 58 year old male, former smoker followed for COPD, post COVID NSIP, lung mass. He is a patient of Dr. Lacinda Pica and last seen in office 07/15/2023. Past medical history significant for MS and associated chronic aspiration on IVIG, BPH, CAD, trigeminal neuralgia.   TEST/EVENTS:  11/27/2020 PFT: FVC 86, FEV1 92, ratio 86, TLC 94, DLCO 98.  No BD 08/19/2023 super D CT chest: Atherosclerosis.  Decreased to subcarinal adenopathy.  Mild nodular consolidation with surrounding groundglass up to 2.2 cm in right upper lobe, improved compared to prior.  Mild centrilobular emphysema.  Improved right middle lobe airspace disease.  New subpleural and peribronchovascular left upper lobe airspace and groundglass.  Diffuse left lower lobe groundglass opacity, improved but with a more focal nodular consolidation, 1.4 cm.  Left lower lobe 4 mm nodule, presumed benign.  07/15/2023: OV with Dr. Baldwin Levee.  Abnormal CT scan of the chest with masslike consolidation in the right upper lobe, moderate consolidation right middle lobe extending into the perihilar region.  Suspicious for pneumonia although clinical history not entirely consistent.  Has been started on cefdinir so plan to finish this.  Repeat imaging in about 6 weeks.  May need bronchoscopy if abnormalities persist.  Continue Spiriva  12/01/2023: Today - follow up Discussed the use of AI scribe software for clinical note transcription with the patient, who gave verbal consent to proceed.  History of Present Illness   Randy Greene is a 58 year old male who presents for follow-up and repeat CT scan.  He had a hospitalization after his last CT in April for pneumonia.  He has been feeling much better since his discharge.  His breathing is currently stable. No fevers,  chills, hemoptysis, or unintentional weight loss over fifteen pounds.  He uses mullein leaf to help clear mucus and reduce airway inflammation, which he finds effective. He uses albuterol  approximately once a week.  Was using it more during allergy season but seems to have improved with increased allergy medication. [place], which contribute to his allergy symptoms.  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       Allergies  Allergen Reactions   Penicillins Hives, Other (See Comments) and Rash    My face got flush   Tape Rash   Zoloft [Sertraline] Rash   Food Nausea And Vomiting and Other (See Comments)    GREEN PEPPERS- flushed  PT DOES NOT EAT MEAT   Morphine Itching   Mushroom Extract Complex (Obsolete) Nausea And Vomiting and Other (See Comments)    And flushed Other reaction(s): Other (See Comments) And flushed   Penicillin G Rash   Pravastatin Other (See Comments)    Immunization History  Administered Date(s) Administered   Influenza,inj,Quad PF,6+ Mos 05/28/2016, 05/04/2017, 03/25/2018, 05/10/2019, 03/11/2020   Influenza-Unspecified 06/22/2008, 10/01/2011, 05/22/2012, 03/22/2013, 03/20/2014, 04/11/2015   PFIZER(Purple Top)SARS-COV-2 Vaccination 08/05/2019, 08/26/2019, 03/22/2020   Pneumococcal Conjugate-13 07/08/2018   Pneumococcal Polysaccharide-23 05/08/2014   Tdap 07/28/2011    Past Medical History:  Diagnosis Date   Acid reflux    Arthritis    Aspiration pneumonia (HCC)    Asthma    COPD (chronic obstructive pulmonary disease) (  HCC)    Delayed wound healing    left knee   Dyspnea    Enlarged prostate    Hypercapnic respiratory failure, chronic (HCC)    Hypertension    MS (multiple sclerosis) (HCC)     Tobacco History: Social History   Tobacco Use  Smoking Status Former   Current packs/day: 0.00   Average packs/day: 1 pack/day for 20.0 years (20.0  ttl pk-yrs)   Types: Cigarettes   Start date: 11/02/1986   Quit date: 11/02/2006   Years since quitting: 17.0  Smokeless Tobacco Never   Counseling given: Not Answered   Outpatient Medications Prior to Visit  Medication Sig Dispense Refill   acetaminophen -codeine  (TYLENOL  #3) 300-30 MG tablet Take 1 tablet by mouth 2 (two) times daily as needed for moderate pain.     albuterol  (PROVENTIL  HFA;VENTOLIN  HFA) 108 (90 BASE) MCG/ACT inhaler Inhale 2 puffs into the lungs every 6 (six) hours as needed for wheezing or shortness of breath.     alfuzosin  (UROXATRAL ) 10 MG 24 hr tablet Take 10 mg by mouth every evening.     amphetamine -dextroamphetamine  (ADDERALL XR) 30 MG 24 hr capsule Take 30 mg by mouth 2 (two) times daily.     aspirin  EC 81 MG tablet Take 81 mg by mouth daily.     baclofen  (LIORESAL ) 20 MG tablet Take 20 mg by mouth 4 (four) times daily.     calcium  carbonate (OS-CAL) 1250 (500 Ca) MG chewable tablet Chew 1 tablet by mouth 2 (two) times daily.     carboxymethylcellulose 1 % ophthalmic solution Place 1 drop into both eyes as needed (dry eyes).     Cholecalciferol (VITAMIN D ) 50 MCG (2000 UT) tablet Take 6,000 Units by mouth daily.     cyanocobalamin  (,VITAMIN B-12,) 1000 MCG/ML injection Inject 1,000 mcg into the muscle every 28 (twenty-eight) days.     donepezil  (ARICEPT ) 10 MG tablet Take 10 mg by mouth at bedtime.     esomeprazole (NEXIUM) 40 MG capsule Take 40 mg by mouth 2 (two) times daily before a meal.     ezetimibe (ZETIA) 10 MG tablet Take 10 mg by mouth daily.     famotidine  (PEPCID ) 40 MG tablet Take 40 mg by mouth at bedtime.     finasteride  (PROSCAR ) 5 MG tablet Take 5 mg by mouth in the morning.     fluticasone  (FLONASE ) 50 MCG/ACT nasal spray Place 1 spray into both nostrils daily.     guaiFENesin -dextromethorphan (ROBITUSSIN DM) 100-10 MG/5ML syrup Take 5 mLs by mouth every 4 (four) hours as needed for cough. 118 mL 0   ketotifen  (ZADITOR ) 0.025 % ophthalmic  solution Place 1 drop into both eyes 2 (two) times daily as needed (itching).     magnesium  (MAGTAB) 84 MG ( ) TBCR SR tablet Take 42 mg by mouth 2 (two) times daily.     meloxicam (MOBIC) 15 MG tablet Take 15 mg by mouth daily. For knee pain     metoCLOPramide  (REGLAN ) 10 MG tablet Take 10 mg by mouth every evening.     montelukast  (SINGULAIR ) 10 MG tablet Take 10 mg by mouth at bedtime.     naloxone  (NARCAN ) nasal spray 4 mg/0.1 mL Place 1 spray into the nose See admin instructions. SPRAY 1 SPRAY INTO ONE NOSTRIL AS DIRECTED FOR OPIOID OVERDOSE (TURN PERSON ON SIDE AFTER DOSE. IF NO RESPONSE IN 2-3 MINUTES OR PERSON RESPONDS BUT RELAPSES, REPEAT USING A NEW SPRAY DEVICE AND SPRAY INTO THE OTHER NOSTRIL.  CALL 911 AFTER USE.) * EMERGENCY USE ONLY *     Ofatumumab 20 MG/0.4ML SOAJ Inject 20 mg into the skin every 30 (thirty) days. Kesimpta     Omega-3 Fatty Acids (FISH OIL) 1000 MG CAPS Take 1,000 mg by mouth in the morning, at noon, and at bedtime.     phenytoin  (DILANTIN ) 100 MG ER capsule Take 100 mg by mouth 2 (two) times daily.     pregabalin  (LYRICA ) 150 MG capsule Take 150-300 mg by mouth See admin instructions. Take 1 capsule by mouth twice a day, then take 2 capsules in the evening     salicyclic acid-sulfur (SEBULEX) 2-2 % shampoo Apply 1 application  topically daily as needed for itching.     Tiotropium Bromide Monohydrate (SPIRIVA RESPIMAT) 2.5 MCG/ACT AERS Inhale 1 each into the lungs daily.     traMADol  (ULTRAM ) 50 MG tablet Take 50 mg by mouth daily as needed for moderate pain.     vitamin C  (ASCORBIC ACID ) 500 MG tablet Take 500 mg by mouth See admin instructions. Take 1 tablet by mouth every 2 days     vitamin E 180 MG (400 UNITS) capsule Take 400 Units by mouth daily.     zinc sulfate 220 (50 Zn) MG capsule Take 220 mg by mouth every other day.     No facility-administered medications prior to visit.     Review of Systems:   Constitutional: No weight loss or gain, night  sweats, fevers, chills, fatigue, or lassitude. HEENT: No headaches, difficulty swallowing, tooth/dental problems, or sore throat. + Seasonal allergies CV:  No chest pain, orthopnea, PND, swelling in lower extremities, anasarca, dizziness, palpitations, syncope Resp: + Minimal shortness of breath with exertion; occasional productive cough.  No hemoptysis. No wheezing.  No chest wall deformity GI:  No heartburn, indigestion, abdominal pain, nausea, vomiting, diarrhea, change in bowel habits, loss of appetite, bloody stools.  GU: No dysuria, change in color of urine, urgency or frequency.  No flank pain, no hematuria  Skin: No rash, lesions, ulcerations MSK:  No joint pain or swelling.   Neuro: No dizziness or lightheadedness.  Psych: No depression or anxiety. Mood stable.     Physical Exam:  BP 130/81   Pulse (!) 102   Ht 6' 2 (1.88 m)   Wt 210 lb (95.3 kg)   SpO2 96%   BMI 26.96 kg/m   GEN: Pleasant, interactive, well-appearing; in no acute distress HEENT:  Normocephalic and atraumatic. PERRLA. Sclera white. Nasal turbinates pink, moist and patent bilaterally. No rhinorrhea present. Oropharynx pink and moist, without exudate or edema. No lesions, ulcerations, or postnasal drip.  NECK:  Supple w/ fair ROM. No lymphadenopathy.   CV: RRR, no m/r/g, no peripheral edema. Pulses intact, +2 bilaterally. No cyanosis, pallor or clubbing. PULMONARY:  Unlabored, regular breathing. Clear bilaterally A&P w/o wheezes/rales/rhonchi. No accessory muscle use.  GI: BS present and normoactive. Soft, non-tender to palpation. No organomegaly or masses detected.  MSK: No erythema, warmth or tenderness. Cap refil <2 sec all extrem. No deformities or joint swelling noted.  Neuro: A/Ox3. No focal deficits noted.   Skin: Warm, no lesions or rashe Psych: Normal affect and behavior. Judgement and thought content appropriate.     Lab Results:  CBC    Component Value Date/Time   WBC 11.8 (H) 10/03/2023  1124   RBC 4.90 10/03/2023 1124   HGB 15.3 10/03/2023 1124   HCT 46.2 10/03/2023 1124   PLT 208 10/03/2023 1124  MCV 94.3 10/03/2023 1124   MCH 31.2 10/03/2023 1124   MCHC 33.1 10/03/2023 1124   RDW 14.0 10/03/2023 1124   LYMPHSABS 1.3 10/02/2023 1740   MONOABS 1.6 (H) 10/02/2023 1740   EOSABS 0.3 10/02/2023 1740   BASOSABS 0.1 10/02/2023 1740    BMET    Component Value Date/Time   NA 136 10/03/2023 1124   K 4.1 10/03/2023 1124   CL 102 10/03/2023 1124   CO2 21 (L) 10/03/2023 1124   GLUCOSE 99 10/03/2023 1124   BUN 8 10/03/2023 1124   CREATININE 0.70 10/03/2023 1124   CALCIUM  9.2 10/03/2023 1124   GFRNONAA >60 10/03/2023 1124   GFRAA >60 01/04/2017 1202    BNP    Component Value Date/Time   BNP 15.6 01/23/2021 0320     Imaging:  No results found.  Administration History     None          Latest Ref Rng & Units 11/27/2020   10:03 AM  PFT Results  FVC-Pre L 4.67   FVC-Predicted Pre % 86   FVC-Post L 4.04   FVC-Predicted Post % 74   Pre FEV1/FVC % % 82   Post FEV1/FCV % % 86   FEV1-Pre L 3.81   FEV1-Predicted Pre % 92   FEV1-Post L 3.45   DLCO uncorrected ml/min/mmHg 30.49   DLCO UNC% % 98   DLCO corrected ml/min/mmHg 30.49   DLCO COR %Predicted % 98   DLVA Predicted % 95   TLC L 7.19   TLC % Predicted % 94   RV % Predicted % 98     No results found for: NITRICOXIDE      Assessment & Plan:   COPD (chronic obstructive pulmonary disease) (HCC) Mild.  Hospitalized in April 2025 for pneumonia.  Stable since that time.  Continue current regimen.  Action plan in place.  Trigger prevention reviewed.  Patient Instructions  Continue Albuterol  inhaler 2 puffs or 3 mL every 6 hours as needed for shortness of breath or wheezing. Notify if symptoms persist despite rescue inhaler/neb use.  Continue Spiriva 2 puffs daily  Continue flonase  nasal spray 2 sprays each nostril daily Continue montelukast  (singulair ) 1 tab daily Continue guaifenesin -DM  for cough congestion as needed  Repeat CT chest - someone will contact you to schedule   Follow up in 6 months with Dr. Baldwin Levee. If symptoms worsen, please contact office for sooner follow up or seek emergency care.    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 Continue to monitor   Abnormal CT of the chest Some areas improvement on CT from 07/2023 with other areas of increased airspace disease and a more nodular focal consolidation in the left lower lobe.  He had acute symptoms shortly after this and was hospitalized for pneumonia.  Will plan to repeat CT to assess for evaluation.  If abnormalities persist, will need to move forward with bronchoscopy for further evaluation.   Advised if symptoms do not improve or worsen, to please contact office for sooner follow up or seek emergency care.   I spent 35 minutes of dedicated to the care of this patient on the date of this encounter to include pre-visit review of records, face-to-face time with the patient discussing conditions above, post visit ordering of testing, clinical documentation with the electronic health record, making appropriate referrals as documented, and communicating necessary findings to members of the patients care team.  Roetta Clarke, NP 12/01/2023  Pt aware and  understands NP's role.

## 2023-12-01 NOTE — Assessment & Plan Note (Signed)
 Felt to be post COVID. Continue to monitor

## 2023-12-01 NOTE — Assessment & Plan Note (Signed)
 Some areas improvement on CT from 07/2023 with other areas of increased airspace disease and a more nodular focal consolidation in the left lower lobe.  He had acute symptoms shortly after this and was hospitalized for pneumonia.  Will plan to repeat CT to assess for evaluation.  If abnormalities persist, will need to move forward with bronchoscopy for further evaluation.

## 2023-12-01 NOTE — Patient Instructions (Addendum)
 Continue Albuterol  inhaler 2 puffs or 3 mL every 6 hours as needed for shortness of breath or wheezing. Notify if symptoms persist despite rescue inhaler/neb use.  Continue Spiriva 2 puffs daily  Continue flonase  nasal spray 2 sprays each nostril daily Continue montelukast  (singulair ) 1 tab daily Continue guaifenesin -DM for cough congestion as needed  Repeat CT chest - someone will contact you to schedule   Follow up in 6 months with Dr. Baldwin Levee. If symptoms worsen, please contact office for sooner follow up or seek emergency care.

## 2023-12-01 NOTE — Assessment & Plan Note (Signed)
 Mild.  Hospitalized in April 2025 for pneumonia.  Stable since that time.  Continue current regimen.  Action plan in place.  Trigger prevention reviewed.  Patient Instructions  Continue Albuterol  inhaler 2 puffs or 3 mL every 6 hours as needed for shortness of breath or wheezing. Notify if symptoms persist despite rescue inhaler/neb use.  Continue Spiriva 2 puffs daily  Continue flonase  nasal spray 2 sprays each nostril daily Continue montelukast  (singulair ) 1 tab daily Continue guaifenesin -DM for cough congestion as needed  Repeat CT chest - someone will contact you to schedule   Follow up in 6 months with Dr. Baldwin Levee. If symptoms worsen, please contact office for sooner follow up or seek emergency care.

## 2023-12-15 ENCOUNTER — Ambulatory Visit (HOSPITAL_BASED_OUTPATIENT_CLINIC_OR_DEPARTMENT_OTHER)
Admission: RE | Admit: 2023-12-15 | Discharge: 2023-12-15 | Disposition: A | Discharge status: Home / Self Care | Source: Ambulatory Visit | Attending: Nurse Practitioner | Admitting: Nurse Practitioner

## 2023-12-15 DIAGNOSIS — R9389 Abnormal findings on diagnostic imaging of other specified body structures: Secondary | ICD-10-CM | POA: Insufficient documentation

## 2023-12-15 DIAGNOSIS — R918 Other nonspecific abnormal finding of lung field: Secondary | ICD-10-CM | POA: Diagnosis present

## 2023-12-16 ENCOUNTER — Ambulatory Visit (HOSPITAL_BASED_OUTPATIENT_CLINIC_OR_DEPARTMENT_OTHER)

## 2024-01-05 ENCOUNTER — Ambulatory Visit: Payer: Self-pay | Admitting: Nurse Practitioner

## 2024-01-05 DIAGNOSIS — R918 Other nonspecific abnormal finding of lung field: Secondary | ICD-10-CM

## 2024-01-05 NOTE — Progress Notes (Signed)
 CT chest significantly improved. He still has some residual consolidation in the RUL, which seems to be scarring. Would recommend repeat CT chest wo con in 3 months to ensure stability or resolution. Thanks!

## 2024-01-05 NOTE — Progress Notes (Signed)
 Order placed for follow-up CT

## 2024-01-05 NOTE — Progress Notes (Signed)
 Left pt message to return call will also notify via My Chart

## 2024-01-19 ENCOUNTER — Telehealth: Payer: Self-pay | Admitting: Internal Medicine

## 2024-01-19 NOTE — Telephone Encounter (Signed)
 Good Morning Dr. Federico,  DOD AM 7/30  Patient is being referred for colonoscopy. Patients last procedure was July, 2024 with [facility]. Patient is requesting a Transfer of care because [facility] is no longer covered under the [place]. Patients records are available in EPIC under media tab. Please review and advise on scheduling further.   Thank You

## 2024-01-19 NOTE — Telephone Encounter (Signed)
 Okay to schedule for OV  Colonoscopy 01/14/23: Normal colonoscopy, biopsied. Repeat colonoscopy recommended in 10 years Path: Collagenous colitis  EGD 02/05/23: Irregular Z-line that was biopsied. Grade B esophagitis. Medium hiatal hernia. Gastritis that was biopsied. Normal duodenum that was biopsied. Path: No celiac disease. Erosive gastritis that was negative for H pylori. Irregular Z-line that showed Barrett's esophagus without any signs of dysplasia. Repeat EGD was recommended in 1 year.

## 2024-01-27 ENCOUNTER — Encounter: Payer: Self-pay | Admitting: Physician Assistant

## 2024-01-27 ENCOUNTER — Ambulatory Visit: Admitting: Physician Assistant

## 2024-01-27 ENCOUNTER — Telehealth: Payer: Self-pay

## 2024-01-27 VITALS — BP 136/80 | HR 83 | Ht 73.5 in | Wt 209.0 lb

## 2024-01-27 DIAGNOSIS — J849 Interstitial pulmonary disease, unspecified: Secondary | ICD-10-CM | POA: Diagnosis not present

## 2024-01-27 DIAGNOSIS — G35 Multiple sclerosis: Secondary | ICD-10-CM

## 2024-01-27 DIAGNOSIS — Z87891 Personal history of nicotine dependence: Secondary | ICD-10-CM

## 2024-01-27 DIAGNOSIS — J449 Chronic obstructive pulmonary disease, unspecified: Secondary | ICD-10-CM | POA: Diagnosis not present

## 2024-01-27 DIAGNOSIS — K219 Gastro-esophageal reflux disease without esophagitis: Secondary | ICD-10-CM

## 2024-01-27 DIAGNOSIS — K2270 Barrett's esophagus without dysplasia: Secondary | ICD-10-CM

## 2024-01-27 DIAGNOSIS — K52831 Collagenous colitis: Secondary | ICD-10-CM

## 2024-01-27 DIAGNOSIS — J8489 Other specified interstitial pulmonary diseases: Secondary | ICD-10-CM

## 2024-01-27 NOTE — Progress Notes (Signed)
 I agree with the assessment and plan as outlined by Ms. Steffanie Dunn.

## 2024-01-27 NOTE — Progress Notes (Signed)
 01/27/2024 Randy Greene [id number] March 19, 1966  Referring provider: [facility] Primary GI doctor: Dr. Federico  ASSESSMENT AND PLAN:  GERD/aspiration history possibly related to MS 2013 pH study poor bolus clearance throughout study reflexes present marked increase in acid reflux events while patient on PPI therapy no symptoms reported during study 2013 esophageal manometry borderline hypotensive LES pressure and markedly low amplitude peristolic contractions of esophageal body with poor bolus clearance EGD 02/05/23: Irregular Z-line that was biopsied. Grade B esophagitis. Medium hiatal hernia. Gastritis that was biopsied. Normal duodenum that was biopsied. Path: No celiac disease. Erosive gastritis that was negative for H pylori. Irregular Z-line that showed Barrett's esophagus without any signs of dysplasia. Repeat EGD was recommended in 1 year. He is on mobic 15 mg once daily, rare ETOH, history of tobacco use He is on nexium 40 mg BID and pepcid  40 mg at bedtime Rare dysphagia with rice/pill otherwise swallowing well controlled -alginate therapy given -Lifestyle changes discussed, avoid NSAIDS, ETOH, hand out given to the patient -Schedule EGD at [facility] will get pulmonary clearance prior, EGD to evaluate GERD, esophagitis, hiatal hernia,H pylori, Barret's. I discussed risks of EGD with patient today, including risk of sedation, bleeding or perforation. Patient provides understanding and gave verbal consent to proceed.  COPD/OSA/COVID NSIP/lung mass Follows with Dr. Shelah last seen 12/01/2023 Recent resolved pneumonia Has O2 at home but has not need it per patient, I think the patient can be done at [facility] but will get pulmonary clearance and send note to Norleen Schillings  Screening colonoscopy 01/14/2023 normal colonoscopy recall 10 years pathology did show collagenous colitis  Multiple sclerosis associated with chronic aspiration  on IVIG and ofatumubab q 30 days  CAD 2022  echocardiogram EF 50 to 55% grade 1 diastolic dysfunction no aortic stenosis  Collagenous colitis, rare tylenol  with codeine  Seen on colonoscopy 01/14/2023 States he has rare diarrhea with ABX but otherwise he is well controlled probiotics -Avoid NSAID  -Can use imodium  AD on as needed basis. -Avoid lactose and high fructose Call if any issues, can consider budesonide  trial  Patient Care Team: [facility] as PCP - General (General Practice)  HISTORY OF PRESENT ILLNESS: 58 y.o. male with a past medical history listed below presents as a new patient for evaluation of Barret's/GERD.   Previously seen at [facility] esophageal disease and swallowing clinic in 2015 for GERD and aspiration and digestive health [facility], last seen 2013.  Significant workup including endoscopy barium swallow pH study esophageal manometry speech therapy evaluation modified swallow chest x-ray invalid by surgeon at the [facility] in [place] for possible knee send medication.  Discussed the use of AI scribe software for clinical note transcription with the patient, who gave verbal consent to proceed.  History of Present Illness   Randy Greene is a 58 year old male with Barrett's esophagus and collagenous colitis who presents for follow-up endoscopy.  He has a history of Barrett's esophagus, diagnosed during an endoscopy in August 2024, which showed no dysplasia. He is scheduled for a repeat endoscopy in a week for surveillance. He has chronic gastroesophageal reflux disease (GERD) managed with Nexium twice daily and Pepcid  at night. No current trouble swallowing, but occasionally experiences reflux and heartburn.  He has collagenous colitis, diagnosed during a colonoscopy in July 2024. Diarrhea occurs primarily when on antibiotics, which he was taking frequently until recently. Probiotics and regular yogurt consumption help maintain regular bowel movements.  He has a history  of multiple pneumonias  related to aspiration, with the most recent episode resolving after antibiotic treatment. A CT scan in June showed improvement of a previously noted mass. He uses oxygen at home but has not needed it for the past six months. He has an adjustable bed to help with aspiration issues.  He takes meloxicam daily for joint pain and occasionally uses indomethacin  for gout flare-ups. He quit smoking in 2009 and consumes alcohol  occasionally on Saturdays.  He has a history of multiple sclerosis (MS), which he notes can cause 'mental pauses'.      He  reports that he quit smoking about 17 years ago. His smoking use included cigarettes. He started smoking about 37 years ago. He has a 20 pack-year smoking history. He has never used smokeless tobacco. He reports current alcohol  use. He reports that he does not use drugs.  RELEVANT GI HISTORY, IMAGING AND LABS: Results   RADIOLOGY Chest CT: Resolution of mass, improved infection (11/2023)  DIAGNOSTIC Upper endoscopy: Barrett's esophagus, no dysplasia (01/2023) Colonoscopy: Collagenous colitis (12/2022)      CBC    Component Value Date/Time   WBC 11.8 (H) 10/03/2023 1124   RBC 4.90 10/03/2023 1124   HGB 15.3 10/03/2023 1124   HCT 46.2 10/03/2023 1124   PLT 208 10/03/2023 1124   MCV 94.3 10/03/2023 1124   MCH 31.2 10/03/2023 1124   MCHC 33.1 10/03/2023 1124   RDW 14.0 10/03/2023 1124   LYMPHSABS 1.3 10/02/2023 1740   MONOABS 1.6 (H) 10/02/2023 1740   EOSABS 0.3 10/02/2023 1740   BASOSABS 0.1 10/02/2023 1740   Recent Labs    03/09/23 0510 03/10/23 0436 03/11/23 0714 03/12/23 0730 10/02/23 1740 10/02/23 1746 10/03/23 1124  HGB 12.2* 10.9* 10.4* 11.0* 16.5 17.0 15.3    CMP     Component Value Date/Time   NA 136 10/03/2023 1124   K 4.1 10/03/2023 1124   CL 102 10/03/2023 1124   CO2 21 (L) 10/03/2023 1124   GLUCOSE 99 10/03/2023 1124   BUN 8 10/03/2023 1124   CREATININE 0.70 10/03/2023 1124   CALCIUM  9.2 10/03/2023 1124   PROT  6.2 (L) 10/02/2023 2049   ALBUMIN 3.5 10/02/2023 2049   AST 18 10/02/2023 2049   ALT 15 10/02/2023 2049   ALKPHOS 69 10/02/2023 2049   BILITOT 0.5 10/02/2023 2049   GFRNONAA >60 10/03/2023 1124   GFRAA >60 01/04/2017 1202      Latest Ref Rng & Units 10/02/2023    8:49 PM 03/11/2023    7:14 AM 03/09/2023    5:10 AM  Hepatic Function  Total Protein 6.5 - 8.1 g/dL 6.2  5.9  6.7   Albumin 3.5 - 5.0 g/dL 3.5  2.4  2.9   AST 15 - 41 U/L 18  28  32   ALT 0 - 44 U/L 15  20  21    Alk Phosphatase 38 - 126 U/L 69  60  73   Total Bilirubin 0.0 - 1.2 mg/dL 0.5  0.3  0.4       Current Medications:    Current Outpatient Medications (Cardiovascular):    ezetimibe (ZETIA) 10 MG tablet, Take 10 mg by mouth daily.  Current Outpatient Medications (Respiratory):    albuterol  (PROVENTIL  HFA;VENTOLIN  HFA) 108 (90 BASE) MCG/ACT inhaler, Inhale 2 puffs into the lungs every 6 (six) hours as needed for wheezing or shortness of breath.   fluticasone  (FLONASE ) 50 MCG/ACT nasal spray, Place 1 spray into both nostrils daily.   guaiFENesin -dextromethorphan (  ROBITUSSIN DM) 100-10 MG/5ML syrup, Take 5 mLs by mouth every 4 (four) hours as needed for cough.   montelukast  (SINGULAIR ) 10 MG tablet, Take 10 mg by mouth at bedtime.   Tiotropium Bromide Monohydrate (SPIRIVA RESPIMAT) 2.5 MCG/ACT AERS, Inhale 1 each into the lungs daily.  Current Outpatient Medications (Analgesics):    acetaminophen -codeine  (TYLENOL  #3) 300-30 MG tablet, Take 1 tablet by mouth 2 (two) times daily as needed for moderate pain.   aspirin  EC 81 MG tablet, Take 81 mg by mouth daily.   meloxicam (MOBIC) 15 MG tablet, Take 15 mg by mouth daily. For knee pain  Current Outpatient Medications (Hematological):    cyanocobalamin  (,VITAMIN B-12,) 1000 MCG/ML injection, Inject 1,000 mcg into the muscle every 28 (twenty-eight) days.  Current Outpatient Medications (Other):    alfuzosin  (UROXATRAL ) 10 MG 24 hr tablet, Take 10 mg by mouth every  evening.   amphetamine -dextroamphetamine  (ADDERALL XR) 30 MG 24 hr capsule, Take 30 mg by mouth 2 (two) times daily.   baclofen  (LIORESAL ) 20 MG tablet, Take 20 mg by mouth 4 (four) times daily.   calcium  carbonate (OS-CAL) 1250 (500 Ca) MG chewable tablet, Chew 1 tablet by mouth 2 (two) times daily.   carboxymethylcellulose 1 % ophthalmic solution, Place 1 drop into both eyes as needed (dry eyes).   Cholecalciferol (VITAMIN D ) 50 MCG (2000 UT) tablet, Take 6,000 Units by mouth daily.   donepezil  (ARICEPT ) 10 MG tablet, Take 10 mg by mouth at bedtime.   esomeprazole (NEXIUM) 40 MG capsule, Take 40 mg by mouth 2 (two) times daily before a meal.   famotidine  (PEPCID ) 40 MG tablet, Take 40 mg by mouth at bedtime.   finasteride  (PROSCAR ) 5 MG tablet, Take 5 mg by mouth in the morning.   ketotifen  (ZADITOR ) 0.025 % ophthalmic solution, Place 1 drop into both eyes 2 (two) times daily as needed (itching).   magnesium  (MAGTAB) 84 MG ( ) TBCR SR tablet, Take 42 mg by mouth 2 (two) times daily.   metoCLOPramide  (REGLAN ) 10 MG tablet, Take 10 mg by mouth every evening.   naloxone  (NARCAN ) nasal spray 4 mg/0.1 mL, Place 1 spray into the nose See admin instructions. SPRAY 1 SPRAY INTO ONE NOSTRIL AS DIRECTED FOR OPIOID OVERDOSE (TURN PERSON ON SIDE AFTER DOSE. IF NO RESPONSE IN 2-3 MINUTES OR PERSON RESPONDS BUT RELAPSES, REPEAT USING A NEW SPRAY DEVICE AND SPRAY INTO THE OTHER NOSTRIL. CALL 911 AFTER USE.) * EMERGENCY USE ONLY *   Ofatumumab 20 MG/0.4ML SOAJ, Inject 20 mg into the skin every 30 (thirty) days. Kesimpta   Omega-3 Fatty Acids (FISH OIL) 1000 MG CAPS, Take 1,000 mg by mouth in the morning, at noon, and at bedtime.   OXYGEN, Inhale into the lungs as needed.   phenytoin  (DILANTIN ) 100 MG ER capsule, Take 100 mg by mouth 2 (two) times daily.   pregabalin  (LYRICA ) 150 MG capsule, Take 150-300 mg by mouth See admin instructions. Take 1 capsule by mouth twice a day, then take 2 capsules in the  evening   salicyclic acid-sulfur (SEBULEX) 2-2 % shampoo, Apply 1 application  topically daily as needed for itching.   vitamin C  (ASCORBIC ACID ) 500 MG tablet, Take 500 mg by mouth See admin instructions. Take 1 tablet by mouth every 2 days   vitamin E 180 MG (400 UNITS) capsule, Take 400 Units by mouth daily.   zinc sulfate 220 (50 Zn) MG capsule, Take 220 mg by mouth every other day.  Medical History:  Past Medical History:  Diagnosis Date   Acid reflux    Anemia    Arthritis    Aspiration pneumonia (HCC)    Asthma    Barrett's esophagus    Collagenous colitis    COPD (chronic obstructive pulmonary disease) (HCC)    Delayed wound healing    left knee   Dyspnea    Enlarged prostate    GERD (gastroesophageal reflux disease)    Hypercapnic respiratory failure, chronic (HCC)    Hypertension    MS (multiple sclerosis) (HCC)    Allergies:  Allergies  Allergen Reactions   Penicillins Hives, Other (See Comments) and Rash    My face got flush   Tape Rash   Zoloft [Sertraline] Rash   Food Nausea And Vomiting and Other (See Comments)    GREEN PEPPERS- flushed  PT DOES NOT EAT MEAT   Morphine Itching   Mushroom Extract Complex (Obsolete) Nausea And Vomiting and Other (See Comments)    And flushed Other reaction(s): Other (See Comments) And flushed   Penicillin G Rash   Pravastatin Other (See Comments)     Surgical History:  He  has a past surgical history that includes Hernia repair; ARTHROSCOPIC (Left); knees scope (Right); jawsurgery; Knee Arthroplasty (Left, 08/10/2016); Incision and drainage (Left, 01/04/2017); Video bronchoscopy (Bilateral, 01/14/2021); Bronchial washings (01/14/2021); Tonsillectomy and adenoidectomy; and Knee Arthroplasty (Right, 09/30/2022). Family History:  His family history includes Dementia in his mother; Heart attack in his father; Hypertension in his father.  REVIEW OF SYSTEMS  : All other systems reviewed and negative except where noted in the  History of Present Illness.  PHYSICAL EXAM: BP 136/80 (BP Location: Left Arm, Patient Position: Sitting, Cuff Size: Normal)   Pulse 83   Ht 6' 1.5 (1.867 m)   Wt 209 lb (94.8 kg)   BMI 27.20 kg/m  Physical Exam   GENERAL APPEARANCE: Well nourished, in no apparent distress HEENT: No cervical lymphadenopathy, unremarkable thyroid , sclerae anicteric, conjunctiva pink RESPIRATORY: Respiratory effort normal, BS equal bilateral without rales, rhonchi, wheezing CARDIO: RRR with no MRGs, peripheral pulses intact ABDOMEN: Soft, non distended, active bowel sounds in all 4 quadrants, no tenderness to palpation, no rebound, no mass appreciated RECTAL: declines MUSCULOSKELETAL: Full ROM, normal gait, without edema SKIN: Dry, intact without rashes or lesions. No jaundice. NEURO: Alert, oriented, no focal deficits PSYCH: Cooperative, normal mood and affect.      Alan JONELLE Coombs, PA-C 2:51 PM

## 2024-01-27 NOTE — Telephone Encounter (Signed)
  Randy Greene [birth year] [id number]  01/27/24    Dear Dr. Shelah:  We have scheduled the above named patient for a(n) EGD procedure with Dr. Rosario C. Dorsey.   Please advise as to whether the patient is cleared from a Pulmonary stand point for the procedure which is scheduled for 02-16-24.  Please route your response to Clarita Favre, CMA or fax response to [phone].  Sincerely,    [facility]

## 2024-01-27 NOTE — Patient Instructions (Addendum)
 You have been scheduled for an endoscopy. Please follow written instructions given to you at your visit today.  If you use inhalers (even only as needed), please bring them with you on the day of your procedure.  We will request Pulmonary clearance from Dr. Shelah.  We will confirm your case is appropriate for our endoscopy center in the building or we may need to reschedule you at the hospital with Dr. Federico.  If you take any of the following medications, they will need to be adjusted prior to your procedure:   DO NOT TAKE 7 DAYS PRIOR TO TEST- Trulicity (dulaglutide) Ozempic, Wegovy (semaglutide) Mounjaro (tirzepatide) Bydureon Bcise (exanatide extended release)  DO NOT TAKE 1 DAY PRIOR TO YOUR TEST Rybelsus (semaglutide) Adlyxin (lixisenatide) Victoza (liraglutide) Byetta (exanatide) ___________________________________________________________________________    What Is Microscopic Colitis? Microscopic colitis is a condition that causes chronic, watery diarrhea. Unlike other types of colitis (like ulcerative colitis or Crohn's disease), the colon (large intestine) appears normal during a colonoscopy. The inflammation can only be seen under a microscope--hence the name.  There are two main types:  Lymphocytic colitis - increased white blood cells (lymphocytes) in the colon lining Collagenous colitis - thickened layer of collagen (a protein) in the colon lining  Common Symptoms  Ongoing watery diarrhea (often multiple times a day) Urgency to have a bowel movement Abdominal pain or cramping Bloating or gas Fatigue Weight loss (less common)  Causes and Risk Factors The exact cause isn't fully known, but possible contributing factors include: Immune system reactions  Medications, such as: NSAIDs (e.g., ibuprofen ) Proton pump inhibitors (e.g., omeprazole) SSRIs (e.g., sertraline) Smoking Older age (most common in people over 9) Male sex (more common in women)   How  Is It Diagnosed?  Colonoscopy: usually appears normal  Biopsy (tiny tissue sample from the colon) is needed to see inflammation under a microscope  Treatment Options Treatment depends on how severe your symptoms are:  Lifestyle & Diet Changes  Avoid trigger foods (e.g., caffeine, dairy, fatty foods) Quit smoking Reduce alcohol  Stay hydrated  Medications  Anti-diarrheal meds (like loperamide /Imodium ) Budesonide  (a corticosteroid with fewer side effects, often the first choice for moderate-to-severe cases) Bismuth subsalicylate (e.g., Pepto-Bismol) Stopping or switching medications that may be triggering the condition   What's the Outlook?  Many people improve with treatment. The condition may come and go. It's not associated with colon cancer. Regular follow-up helps keep symptoms under control.  Thank you for entrusting me with your care and for choosing [facility], Alan Coombs, P.A.-C

## 2024-01-31 ENCOUNTER — Telehealth: Payer: Self-pay

## 2024-01-31 NOTE — Telephone Encounter (Signed)
-----   Message from Alan JONELLE Coombs sent at 01/28/2024 10:38 AM EDT ----- Regarding: FW: Anesthesia, patient cleared, just FYI Jan Just an FYI, he is cleared by [facility].  Alan ----- Message ----- From: Dyan Rush, CRNA Sent: 01/28/2024   9:10 AM EDT To: Alan JONELLE Coombs, PA-C Subject: RE: Anesthesia                                 Alan,  This pt is cleared for anesthetic care at [facility].  Thanks,  Rush Dyan ----- Message ----- From: Coombs Alan JONELLE DEVONNA Sent: 01/27/2024   3:48 PM EDT To: Rush Dyan, CRNA Subject: Anesthesia                                     Can you please evaluate chart and let me know if this patient can be done in [facility]? He was on oxygen after COVID 6 + months ago but he has not used it in 6 months, looked and sounded good at visit.  I'm getting pulmonary clearance with Dr. Shelah.  Thanks

## 2024-01-31 NOTE — Telephone Encounter (Signed)
 Called and spoke to patient.  Let him know he has been cleared to have his procedure at the [facility]. Patient expressed understanding.

## 2024-02-01 NOTE — Telephone Encounter (Signed)
 He was seen in our office in June.  He is at moderate risk for general anesthesia, conscious sedation due to his underlying lung disease but no contraindications for his procedure unless he is having flaring symptoms of dyspnea, cough, chest discomfort.

## 2024-02-01 NOTE — Telephone Encounter (Signed)
 Called and spoke to patient.  He indicates he is not have any flaring symptoms. He has Pulmonary clearance. Patient expressed understanding

## 2024-02-09 ENCOUNTER — Other Ambulatory Visit: Payer: Self-pay | Admitting: Medical Genetics

## 2024-02-16 ENCOUNTER — Ambulatory Visit: Admitting: Internal Medicine

## 2024-02-16 ENCOUNTER — Encounter: Payer: Self-pay | Admitting: Internal Medicine

## 2024-02-16 VITALS — BP 144/98 | HR 72 | Temp 93.0°F | Resp 13 | Ht 73.0 in | Wt 209.0 lb

## 2024-02-16 DIAGNOSIS — K449 Diaphragmatic hernia without obstruction or gangrene: Secondary | ICD-10-CM | POA: Diagnosis not present

## 2024-02-16 DIAGNOSIS — K259 Gastric ulcer, unspecified as acute or chronic, without hemorrhage or perforation: Secondary | ICD-10-CM | POA: Diagnosis not present

## 2024-02-16 DIAGNOSIS — K209 Esophagitis, unspecified without bleeding: Secondary | ICD-10-CM

## 2024-02-16 DIAGNOSIS — K219 Gastro-esophageal reflux disease without esophagitis: Secondary | ICD-10-CM

## 2024-02-16 DIAGNOSIS — K2270 Barrett's esophagus without dysplasia: Secondary | ICD-10-CM

## 2024-02-16 DIAGNOSIS — K2100 Gastro-esophageal reflux disease with esophagitis, without bleeding: Secondary | ICD-10-CM | POA: Diagnosis present

## 2024-02-16 DIAGNOSIS — K2289 Other specified disease of esophagus: Secondary | ICD-10-CM | POA: Diagnosis not present

## 2024-02-16 MED ORDER — SUCRALFATE 1 GM/10ML PO SUSP: 1.0000 g | Freq: Four times a day (QID) | ORAL | 0 refills | Status: AC

## 2024-02-16 MED ORDER — FLUCONAZOLE 100 MG PO TABS
ORAL_TABLET | ORAL | 0 refills | Status: DC
Start: 2024-02-16 — End: 2024-02-16

## 2024-02-16 MED ORDER — FLUCONAZOLE 100 MG PO TABS: ORAL_TABLET | ORAL | 0 refills | Status: DC

## 2024-02-16 MED ORDER — SUCRALFATE 1 GM/10ML PO SUSP: 1.0000 g | Freq: Four times a day (QID) | ORAL | 0 refills | Status: DC

## 2024-02-16 MED ORDER — SODIUM CHLORIDE 0.9 % IV SOLN: 500.0000 mL | Freq: Once | INTRAVENOUS | Status: AC

## 2024-02-16 NOTE — Progress Notes (Signed)
 GASTROENTEROLOGY PROCEDURE H&P NOTE   Primary Care Physician: [facility]    Reason for Procedure:   GERD, history of grade B esophagitis, hiatal hernia, Barrett's esophagus without dysplasia  Plan:    EGD  Patient is appropriate for endoscopic procedure(s) in the ambulatory ([facility]) setting.  The nature of the procedure, as well as the risks, benefits, and alternatives were carefully and thoroughly reviewed with the patient. Ample time for discussion and questions allowed. The patient understood, was satisfied, and agreed to proceed.     HPI: Randy Greene is a 58 y.o. male who presents for EGD for evaluation of GERD, history of grade B esophagitis, hiatal hernia, Barrett's esophagus without dysplasia .  Patient was most recently seen in the Gastroenterology Clinic on 01/27/24.  No interval change in medical history since that appointment. Please refer to that note for full details regarding GI history and clinical presentation.   Past Medical History:  Diagnosis Date   Acid reflux    Anemia    Arthritis    Aspiration pneumonia (HCC)    Asthma    Barrett's esophagus    Collagenous colitis    COPD (chronic obstructive pulmonary disease) (HCC)    Delayed wound healing    left knee   Dyspnea    Enlarged prostate    GERD (gastroesophageal reflux disease)    Hypercapnic respiratory failure, chronic (HCC)    Hypertension    MS (multiple sclerosis) (HCC)     Past Surgical History:  Procedure Laterality Date   ARTHROSCOPIC Left    BRONCHIAL WASHINGS  01/14/2021   Procedure: BRONCHIAL WASHINGS;  Surgeon: Claudene Toribio BROCKS, MD;  Location: [facility] ENDOSCOPY;  Service: Pulmonary;;   HERNIA REPAIR     INCISION AND DRAINAGE Left 01/04/2017   Procedure: INCISION AND DRAINAGE LEFT KNEE;  Surgeon: Fidel Rogue, MD;  Location: [facility] OR;  Service: Orthopedics;  Laterality: Left;   jawsurgery     KNEE ARTHROPLASTY Left 08/10/2016   Procedure: LEFT TOTAL KNEE ARTHROPLASTY WITH  COMPUTER NAVIGATION;  Surgeon: Rogue Fidel, MD;  Location: [facility] OR;  Service: Orthopedics;  Laterality: Left;   KNEE ARTHROPLASTY Right 09/30/2022   Procedure: COMPUTER ASSISTED TOTAL KNEE ARTHROPLASTY;  Surgeon: Fidel Rogue, MD;  Location: [facility] ORS;  Service: Orthopedics;  Laterality: Right;  160   knees scope Right    TONSILLECTOMY AND ADENOIDECTOMY     VIDEO BRONCHOSCOPY Bilateral 01/14/2021   Procedure: VIDEO BRONCHOSCOPY WITHOUT FLUORO;  Surgeon: Claudene Toribio BROCKS, MD;  Location: [facility] ENDOSCOPY;  Service: Pulmonary;  Laterality: Bilateral;    Prior to Admission medications   Medication Sig Start Date End Date Taking? Authorizing Provider  acetaminophen -codeine  (TYLENOL  #3) 300-30 MG tablet Take 1 tablet by mouth 2 (two) times daily as needed for moderate pain. 05/06/22   [provider]  albuterol  (PROVENTIL  HFA;VENTOLIN  HFA) 108 (90 BASE) MCG/ACT inhaler Inhale 2 puffs into the lungs every 6 (six) hours as needed for wheezing or shortness of breath.    [provider]  alfuzosin  (UROXATRAL ) 10 MG 24 hr tablet Take 10 mg by mouth every evening.    [provider]  amphetamine -dextroamphetamine  (ADDERALL XR) 30 MG 24 hr capsule Take 30 mg by mouth 2 (two) times daily. 09/30/21   [provider]  aspirin  EC 81 MG tablet Take 81 mg by mouth daily. 10/12/22   [provider]  baclofen  (LIORESAL ) 20 MG tablet Take 20 mg by mouth 4 (four) times daily. 09/23/22   [provider]  calcium  carbonate (OS-CAL) 1250 (500 Ca) MG chewable tablet Chew 1 tablet by mouth 2 (two) times daily. 07/24/21   [provider]  carboxymethylcellulose 1 % ophthalmic solution Place 1 drop into both eyes as needed (dry eyes).    [provider]  Cholecalciferol (VITAMIN D ) 50 MCG (2000 UT) tablet Take 6,000 Units by mouth daily.    [provider]  cyanocobalamin  (,VITAMIN B-12,) 1000 MCG/ML injection Inject 1,000 mcg into the muscle every 28  (twenty-eight) days. 02/04/21   [provider]  donepezil  (ARICEPT ) 10 MG tablet Take 10 mg by mouth at bedtime. 08/11/23   [provider]  esomeprazole (NEXIUM) 40 MG capsule Take 40 mg by mouth 2 (two) times daily before a meal.    [provider]  ezetimibe (ZETIA) 10 MG tablet Take 10 mg by mouth daily. 06/11/23   [provider]  famotidine  (PEPCID ) 40 MG tablet Take 40 mg by mouth at bedtime.    [provider]  finasteride  (PROSCAR ) 5 MG tablet Take 5 mg by mouth in the morning.    [provider]  fluticasone  (FLONASE ) 50 MCG/ACT nasal spray Place 1 spray into both nostrils daily.    [provider]  guaiFENesin -dextromethorphan (ROBITUSSIN DM) 100-10 MG/5ML syrup Take 5 mLs by mouth every 4 (four) hours as needed for cough. 10/03/23   Amin, Sumayya, MD  ketotifen  (ZADITOR ) 0.025 % ophthalmic solution Place 1 drop into both eyes 2 (two) times daily as needed (itching).    [provider]  magnesium  (MAGTAB) 84 MG ( ) TBCR SR tablet Take 42 mg by mouth 2 (two) times daily. 07/24/21   [provider]  meloxicam (MOBIC) 15 MG tablet Take 15 mg by mouth daily. For knee pain 01/05/23   [provider]  metoCLOPramide  (REGLAN ) 10 MG tablet Take 10 mg by mouth every evening. 07/24/21   [provider]  montelukast  (SINGULAIR ) 10 MG tablet Take 10 mg by mouth at bedtime.    [provider]  naloxone  (NARCAN ) nasal spray 4 mg/0.1 mL Place 1 spray into the nose See admin instructions. SPRAY 1 SPRAY INTO ONE NOSTRIL AS DIRECTED FOR OPIOID OVERDOSE (TURN PERSON ON SIDE AFTER DOSE. IF NO RESPONSE IN 2-3 MINUTES OR PERSON RESPONDS BUT RELAPSES, REPEAT USING A NEW SPRAY DEVICE AND SPRAY INTO THE OTHER NOSTRIL. CALL 911 AFTER USE.) * EMERGENCY USE ONLY * 01/16/20   [provider]  Ofatumumab 20 MG/0.4ML SOAJ Inject 20 mg into the skin every 30 (thirty) days. Kesimpta 11/20/22   [provider]  Omega-3 Fatty Acids (FISH OIL) 1000 MG CAPS Take 1,000 mg by mouth in the morning, at noon, and at bedtime.    [provider]  OXYGEN Inhale into the lungs as needed.    [provider]  phenytoin  (DILANTIN ) 100 MG ER capsule Take 100 mg by mouth 2 (two) times daily. 07/18/20   [provider]  pregabalin  (LYRICA ) 150 MG capsule Take 150-300 mg by mouth See admin instructions. Take 1 capsule by mouth twice a day, then take 2 capsules in the evening 11/12/22   [provider]  salicyclic acid-sulfur (SEBULEX) 2-2 % shampoo Apply 1 application  topically daily as needed for itching. 07/13/22   [provider]  Tiotropium Bromide Monohydrate (SPIRIVA RESPIMAT) 2.5 MCG/ACT AERS Inhale 1 each into the lungs daily.    [provider]  vitamin C  (ASCORBIC ACID ) 500 MG tablet Take 500 mg by mouth  See admin instructions. Take 1 tablet by mouth every 2 days    [provider]  vitamin E 180 MG (400 UNITS) capsule Take 400 Units by mouth daily.    [provider]  zinc sulfate 220 (50 Zn) MG capsule Take 220 mg by mouth every other day. 06/28/22   [provider]    Current Outpatient Medications  Medication Sig Dispense Refill   albuterol  (PROVENTIL  HFA;VENTOLIN  HFA) 108 (90 BASE) MCG/ACT inhaler Inhale 2 puffs into the lungs every 6 (six) hours as needed for wheezing or shortness of breath.     alfuzosin  (UROXATRAL ) 10 MG 24 hr tablet Take 10 mg by mouth every evening.     amphetamine -dextroamphetamine  (ADDERALL XR) 30 MG 24 hr capsule Take 30 mg by mouth 2 (two) times daily.     aspirin  EC 81 MG tablet Take 81 mg by mouth daily.     baclofen  (LIORESAL ) 20 MG tablet Take 20 mg by mouth 4 (four) times daily.     calcium  carbonate (OS-CAL) 1250 (500 Ca) MG chewable tablet Chew 1 tablet by mouth 2 (two) times daily.     carboxymethylcellulose 1 % ophthalmic solution Place 1 drop into both eyes as needed (dry eyes).      Cholecalciferol (VITAMIN D ) 50 MCG (2000 UT) tablet Take 6,000 Units by mouth daily.     cyanocobalamin  (,VITAMIN B-12,) 1000 MCG/ML injection Inject 1,000 mcg into the muscle every 28 (twenty-eight) days.     donepezil  (ARICEPT ) 10 MG tablet Take 10 mg by mouth at bedtime.     esomeprazole (NEXIUM) 40 MG capsule Take 40 mg by mouth 2 (two) times daily before a meal.     ezetimibe (ZETIA) 10 MG tablet Take 10 mg by mouth daily.     famotidine  (PEPCID ) 40 MG tablet Take 40 mg by mouth at bedtime.     finasteride  (PROSCAR ) 5 MG tablet Take 5 mg by mouth in the morning.     fluticasone  (FLONASE ) 50 MCG/ACT nasal spray Place 1 spray into both nostrils daily.     ketotifen  (ZADITOR ) 0.025 % ophthalmic solution Place 1 drop into both eyes 2 (two) times daily as needed (itching).     magnesium  (MAGTAB) 84 MG ( ) TBCR SR tablet Take 42 mg by mouth 2 (two) times daily.     meloxicam (MOBIC) 15 MG tablet Take 15 mg by mouth daily. For knee pain     metoCLOPramide  (REGLAN ) 10 MG tablet Take 10 mg by mouth every evening.     montelukast  (SINGULAIR ) 10 MG tablet Take 10 mg by mouth at bedtime.     Omega-3 Fatty Acids (FISH OIL) 1000 MG CAPS Take 1,000 mg by mouth in the morning, at noon, and at bedtime.     phenytoin  (DILANTIN ) 100 MG ER capsule Take 100 mg by mouth 2 (two) times daily.     pregabalin  (LYRICA ) 150 MG capsule Take 150-300 mg by mouth See admin instructions. Take 1 capsule by mouth twice a day, then take 2 capsules in the evening     salicyclic acid-sulfur (SEBULEX) 2-2 % shampoo Apply 1 application  topically daily as needed for itching.     Tiotropium Bromide Monohydrate (SPIRIVA RESPIMAT) 2.5 MCG/ACT AERS Inhale 1 each into the lungs daily.     vitamin C  (ASCORBIC ACID ) 500 MG tablet Take 500 mg by mouth See admin instructions. Take 1 tablet by mouth every 2 days     vitamin E 180 MG (400 UNITS) capsule  Take 400 Units by mouth daily.     zinc sulfate 220 (50 Zn) MG capsule Take 220 mg by  mouth every other day.     acetaminophen -codeine  (TYLENOL  #3) 300-30 MG tablet Take 1 tablet by mouth 2 (two) times daily as needed for moderate pain.     guaiFENesin -dextromethorphan (ROBITUSSIN DM) 100-10 MG/5ML syrup Take 5 mLs by mouth every 4 (four) hours as needed for cough. 118 mL 0   naloxone  (NARCAN ) nasal spray 4 mg/0.1 mL Place 1 spray into the nose See admin instructions. SPRAY 1 SPRAY INTO ONE NOSTRIL AS DIRECTED FOR OPIOID OVERDOSE (TURN PERSON ON SIDE AFTER DOSE. IF NO RESPONSE IN 2-3 MINUTES OR PERSON RESPONDS BUT RELAPSES, REPEAT USING A NEW SPRAY DEVICE AND SPRAY INTO THE OTHER NOSTRIL. CALL 911 AFTER USE.) * EMERGENCY USE ONLY *     Ofatumumab 20 MG/0.4ML SOAJ Inject 20 mg into the skin every 30 (thirty) days. Kesimpta     OXYGEN Inhale into the lungs as needed.     Current Facility-Administered Medications  Medication Dose Route Frequency Provider Last Rate Last Admin   0.9 %  sodium chloride  infusion  500 mL Intravenous Once Federico Rosario BROCKS, MD        Allergies as of 02/16/2024 - Review Complete 02/16/2024  Allergen Reaction Noted   Penicillins Hives, Other (See Comments), and Rash 01/01/2014   Tape Rash 10/27/2011   Zoloft [sertraline] Rash 12/15/2013   Food Nausea And Vomiting and Other (See Comments) 01/01/2014   Morphine Itching 07/14/2012   Mushroom extract complex (obsolete) Nausea And Vomiting and Other (See Comments) 01/01/2014   Penicillin g Rash 03/01/2007   Pravastatin Other (See Comments) 02/03/2023    Family History  Problem Relation Age of Onset   Dementia Mother    Hypertension Father    Heart attack Father     Social History   Socioeconomic History   Marital status: Single    Spouse name: Not on file   Number of children: Not on file   Years of education: Not on file   Highest education level: Not on file  Occupational History   Not on file  Tobacco Use   Smoking status: Former    Current packs/day: 0.00    Average packs/day: 1 pack/day  for 20.0 years (20.0 ttl pk-yrs)    Types: Cigarettes    Start date: 11/02/1986    Quit date: 11/02/2006    Years since quitting: 17.3   Smokeless tobacco: Never  Vaping Use   Vaping status: Some Days   Substances: CBD  Substance and Sexual Activity   Alcohol  use: Yes    Comment: ocas.   Drug use: No    Comment: medical marijuana   Sexual activity: Never  Other Topics Concern   Not on file  Social History Narrative   Not on file   Social Drivers of Health   Financial Resource Strain: Not on file  Food Insecurity: No Food Insecurity (10/03/2023)   Hunger Vital Sign    Worried About Running Out of Food in the Last Year: Never true    Ran Out of Food in the Last Year: Never true  Transportation Needs: No Transportation Needs (10/03/2023)   PRAPARE - Administrator, Civil Service (Medical): No    Lack of Transportation (Non-Medical): No  Physical Activity: Not on file  Stress: Not on file  Social Connections: Unknown (02/22/2023)   Received from [facility]   Social Network  Social Network: Not on file  Intimate Partner Violence: Not At Risk (10/03/2023)   Humiliation, Afraid, Rape, and Kick questionnaire    Fear of Current or Ex-Partner: No    Emotionally Abused: No    Physically Abused: No    Sexually Abused: No    Physical Exam: Vital signs in last 24 hours: BP (!) 141/91   Pulse 84   Temp (!) 93 F (33.9 C)   Ht 6' 1 (1.854 m)   Wt 209 lb (94.8 kg)   SpO2 93%   BMI 27.57 kg/m  GEN: NAD EYE: Sclerae anicteric ENT: MMM CV: Non-tachycardic Pulm: No increased WOB GI: Soft NEURO:  Alert & Oriented   Estefana Kidney, MD [facility]   02/16/2024 3:17 PM

## 2024-02-16 NOTE — Progress Notes (Signed)
 Report to PACU, RN, vss, BBS= Clear.

## 2024-02-16 NOTE — Progress Notes (Signed)
 Called to room to assist during endoscopic procedure.  Patient ID and intended procedure confirmed with present staff. Received instructions for my participation in the procedure from the performing physician.

## 2024-02-16 NOTE — Patient Instructions (Addendum)
-  Handout on esophagitis provided. -Continue present medications. Continue Nexium 40 mg twice a day and Famotidine  40 mg at bedtime. Start Sucralfate  and Fluconazole  as prescribed.  - repeat upper endoscopy in 2 months to check healing    YOU HAD AN ENDOSCOPIC PROCEDURE TODAY AT THE [facility]:   Refer to the procedure report that was given to you for any specific questions about what was found during the examination.  If the procedure report does not answer your questions, please call your gastroenterologist to clarify.  If you requested that your care partner not be given the details of your procedure findings, then the procedure report has been included in a sealed envelope for you to review at your convenience later.  YOU SHOULD EXPECT: Some feelings of bloating in the abdomen. Passage of more gas than usual.  Walking can help get rid of the air that was put into your GI tract during the procedure and reduce the bloating. If you had a lower endoscopy (such as a colonoscopy or flexible sigmoidoscopy) you may notice spotting of blood in your stool or on the toilet paper. If you underwent a bowel prep for your procedure, you may not have a normal bowel movement for a few days.  Please Note:  You might notice some irritation and congestion in your nose or some drainage.  This is from the oxygen used during your procedure.  There is no need for concern and it should clear up in a day or so.  SYMPTOMS TO REPORT IMMEDIATELY:  Following upper endoscopy (EGD)  Vomiting of blood or coffee ground material  New chest pain or pain under the shoulder blades  Painful or persistently difficult swallowing  New shortness of breath  Fever of 100F or higher  Black, tarry-looking stools  For urgent or emergent issues, a gastroenterologist can be reached at any hour by calling [phone]. Do not use MyChart messaging for urgent concerns.    DIET:  We do recommend a small meal at first, but  then you may proceed to your regular diet.  Drink plenty of fluids but you should avoid alcoholic beverages for 24 hours.  ACTIVITY:  You should plan to take it easy for the rest of today and you should NOT DRIVE or use heavy machinery until tomorrow (because of the sedation medicines used during the test).    FOLLOW UP: Our staff will call the number listed on your records the next business day following your procedure.  We will call around 7:15- 8:00 am to check on you and address any questions or concerns that you may have regarding the information given to you following your procedure. If we do not reach you, we will leave a message.     If any biopsies were taken you will be contacted by phone or by letter within the next 1-3 weeks.  Please call us  at [phone] if you have not heard about the biopsies in 3 weeks.    SIGNATURES/CONFIDENTIALITY: You and/or your care partner have signed paperwork which will be entered into your electronic medical record.  These signatures attest to the fact that that the information above on your After Visit Summary has been reviewed and is understood.  Full responsibility of the confidentiality of this discharge information lies with you and/or your care-partner.

## 2024-02-16 NOTE — Progress Notes (Signed)
 Pt's states no medical or surgical changes since previsit or office visit.

## 2024-02-16 NOTE — Op Note (Signed)
 [facility] Patient Name: Randy Greene Procedure Date: 02/16/2024 3:23 PM MRN: [id number] Endoscopist: Rosario Estefana Kidney , , [id number] Age: 58 Referring MD:  Date of Birth: 07/22/1965 Gender: Male Account #: [id number] Procedure:                Upper GI endoscopy Indications:              Heartburn, Follow-up of Barrett's esophagus Medicines:                Monitored Anesthesia Care Procedure:                Pre-Anesthesia Assessment:                           - Prior to the procedure, a History and Physical                            was performed, and patient medications and                            allergies were reviewed. The patient's tolerance of                            previous anesthesia was also reviewed. The risks                            and benefits of the procedure and the sedation                            options and risks were discussed with the patient.                            All questions were answered, and informed consent                            was obtained. Prior Anticoagulants: The patient has                            taken no anticoagulant or antiplatelet agents. ASA                            Grade Assessment: III - A patient with severe                            systemic disease. After reviewing the risks and                            benefits, the patient was deemed in satisfactory                            condition to undergo the procedure.                           After obtaining informed consent, the endoscope was  passed under direct vision. Throughout the                            procedure, the patient's blood pressure, pulse, and                            oxygen saturations were monitored continuously. The                            GIF [id number] #[id number] was introduced through the                            mouth, and advanced to the second part of duodenum.                            The  upper GI endoscopy was accomplished without                            difficulty. The patient tolerated the procedure                            well. Scope In: Scope Out: Findings:                 White nummular lesions were noted in the proximal                            esophagus. Biopsies were taken with a cold forceps                            for histology.                           LA Grade C (one or more mucosal breaks continuous                            between tops of 2 or more mucosal folds, less than                            75% circumference) esophagitis with no bleeding was                            found in the distal esophagus. Biopsies were taken                            with a cold forceps for histology.                           A 3 cm hiatal hernia was present.                           The examined duodenum was normal. Complications:            No immediate complications. Estimated Blood Loss:     Estimated blood loss was minimal. Impression:               -  White nummular lesions in esophageal mucosa.                            Biopsied.                           - LA Grade C esophagitis with no bleeding. Biopsied.                           - 3 cm hiatal hernia.                           - Normal examined duodenum. Recommendation:           - Discharge patient to home (with escort).                           - Await pathology results.                           - Repeat upper endoscopy in 2 months to check                            healing.                           - Continue Nexium 40 mg BID and famotidine  40 mg                            QHS.                           - Start sucralfate  susp 1 g QID for 4 weeks to help                            with healing of reflux esophagitis.                           - Start fluconazole  400 mg x 1 dose, followed by                            200 mg daily for 13 days for presumed candida                             esophagitis.                           - The findings and recommendations were discussed                            with the patient. Dr Estefana Federico Rosario Estefana Federico,  02/16/2024 3:42:54 PM

## 2024-02-17 ENCOUNTER — Telehealth: Payer: Self-pay | Admitting: *Deleted

## 2024-02-17 NOTE — Telephone Encounter (Signed)
  Follow up Call-     02/16/2024    2:54 PM  Call back number  Post procedure Call Back phone  # [phone]  Permission to leave phone message Yes     Patient questions:  Do you have a fever, pain , or abdominal swelling? No. Pain Score  0 *  Have you tolerated food without any problems? Yes.    Have you been able to return to your normal activities? Yes.    Do you have any questions about your discharge instructions: Diet   No. Medications  No. Follow up visit  No.  Do you have questions or concerns about your Care? No.  Actions: * If pain score is 4 or above: No action needed, pain <4.

## 2024-02-23 ENCOUNTER — Ambulatory Visit: Payer: Self-pay | Admitting: Internal Medicine

## 2024-02-23 LAB — SURGICAL PATHOLOGY

## 2024-04-06 ENCOUNTER — Ambulatory Visit (HOSPITAL_COMMUNITY)
Admission: RE | Admit: 2024-04-06 | Discharge: 2024-04-06 | Disposition: A | Discharge status: Home / Self Care | Source: Ambulatory Visit | Attending: Nurse Practitioner | Admitting: Nurse Practitioner

## 2024-04-06 DIAGNOSIS — R918 Other nonspecific abnormal finding of lung field: Secondary | ICD-10-CM | POA: Insufficient documentation

## 2024-04-12 ENCOUNTER — Ambulatory Visit: Payer: Self-pay | Admitting: Nurse Practitioner

## 2024-04-12 NOTE — Progress Notes (Signed)
 Decreasing opacity in RUL, consistent with resolving infectious/inflammatory process. Stable chronic changes of scarring in the lungs, plaque buildup in the arteries. Has some possible fatty liver disease - follow up with PCP. Follow up as scheduled

## 2024-04-12 NOTE — Progress Notes (Signed)
 ATC x1. LVMTCB

## 2024-04-13 NOTE — Progress Notes (Signed)
 ATCx2 LVMTCB. Sent MyChart message as pt has been unable to be reached. NFN

## 2024-04-17 ENCOUNTER — Other Ambulatory Visit: Payer: Self-pay | Admitting: Medical Genetics

## 2024-04-17 DIAGNOSIS — Z006 Encounter for examination for normal comparison and control in clinical research program: Secondary | ICD-10-CM

## 2024-04-17 NOTE — Progress Notes (Unsigned)
  Gastroenterology History and Physical   Primary Care Physician:  [facility]   Reason for Procedure:  Heartburn, history of Barrett's esophagus, LA grade C esophagitis on EGD 01/2024, hiatal hernia  Plan:    Upper endoscopy     HPI: Randy Greene is a 58 y.o. male undergoing upper endoscopy for heartburn, history of Barrett's esophagus, LA grade C esophagitis on EGD 01/28/2024 and hiatal hernia.  Due to esophagitis on last EGD patient has been treated with Nexium, Pepcid , Carafate  and fluconazole  for concern of possible Candida.  Repeat EGD is scheduled to assess for healing and to rule out Barrett's esophagus.  No family history of esophageal or gastric cancer   Past Medical History:  Diagnosis Date   Acid reflux    Anemia    Arthritis    Aspiration pneumonia (HCC)    Asthma    Barrett's esophagus    Collagenous colitis    COPD (chronic obstructive pulmonary disease) (HCC)    Delayed wound healing    left knee   Dyspnea    Enlarged prostate    GERD (gastroesophageal reflux disease)    Hypercapnic respiratory failure, chronic (HCC)    Hypertension    MS (multiple sclerosis)     Past Surgical History:  Procedure Laterality Date   ARTHROSCOPIC Left    BRONCHIAL WASHINGS  01/14/2021   Procedure: BRONCHIAL WASHINGS;  Surgeon: Claudene Toribio BROCKS, MD;  Location: [facility] ENDOSCOPY;  Service: Pulmonary;;   HERNIA REPAIR     INCISION AND DRAINAGE Left 01/04/2017   Procedure: INCISION AND DRAINAGE LEFT KNEE;  Surgeon: Fidel Rogue, MD;  Location: [facility] OR;  Service: Orthopedics;  Laterality: Left;   jawsurgery     KNEE ARTHROPLASTY Left 08/10/2016   Procedure: LEFT TOTAL KNEE ARTHROPLASTY WITH COMPUTER NAVIGATION;  Surgeon: Rogue Fidel, MD;  Location: [facility] OR;  Service: Orthopedics;  Laterality: Left;   KNEE ARTHROPLASTY Right 09/30/2022   Procedure: COMPUTER ASSISTED TOTAL KNEE ARTHROPLASTY;  Surgeon: Fidel Rogue, MD;  Location: [facility] ORS;  Service: Orthopedics;   Laterality: Right;  160   knees scope Right    TONSILLECTOMY AND ADENOIDECTOMY     VIDEO BRONCHOSCOPY Bilateral 01/14/2021   Procedure: VIDEO BRONCHOSCOPY WITHOUT FLUORO;  Surgeon: Claudene Toribio BROCKS, MD;  Location: [facility] ENDOSCOPY;  Service: Pulmonary;  Laterality: Bilateral;    Prior to Admission medications   Medication Sig Start Date End Date Taking? Authorizing Provider  acetaminophen -codeine  (TYLENOL  #3) 300-30 MG tablet Take 1 tablet by mouth 2 (two) times daily as needed for moderate pain. 05/06/22   [provider]  albuterol  (PROVENTIL  HFA;VENTOLIN  HFA) 108 (90 BASE) MCG/ACT inhaler Inhale 2 puffs into the lungs every 6 (six) hours as needed for wheezing or shortness of breath.    [provider]  alfuzosin  (UROXATRAL ) 10 MG 24 hr tablet Take 10 mg by mouth every evening.    [provider]  amphetamine -dextroamphetamine  (ADDERALL XR) 30 MG 24 hr capsule Take 30 mg by mouth 2 (two) times daily. 09/30/21   [provider]  aspirin  EC 81 MG tablet Take 81 mg by mouth daily. 10/12/22   [provider]  baclofen  (LIORESAL ) 20 MG tablet Take 20 mg by mouth 4 (four) times daily. 09/23/22   [provider]  calcium  carbonate (OS-CAL) 1250 (500 Ca) MG chewable tablet Chew 1 tablet by mouth 2 (two) times daily. 07/24/21   [provider]  carboxymethylcellulose 1 % ophthalmic solution Place 1 drop into both eyes as needed (  dry eyes).    [provider]  Cholecalciferol (VITAMIN D ) 50 MCG (2000 UT) tablet Take 6,000 Units by mouth daily.    [provider]  cyanocobalamin  (,VITAMIN B-12,) 1000 MCG/ML injection Inject 1,000 mcg into the muscle every 28 (twenty-eight) days. 02/04/21   [provider]  donepezil  (ARICEPT ) 10 MG tablet Take 10 mg by mouth at bedtime. 08/11/23   [provider]  esomeprazole (NEXIUM) 40 MG capsule Take 40 mg by mouth 2 (two) times daily before a meal.    [provider]   ezetimibe (ZETIA) 10 MG tablet Take 10 mg by mouth daily. 06/11/23   [provider]  famotidine  (PEPCID ) 40 MG tablet Take 40 mg by mouth at bedtime.    [provider]  finasteride  (PROSCAR ) 5 MG tablet Take 5 mg by mouth in the morning.    [provider]  fluconazole  (DIFLUCAN ) 100 MG tablet 400 mg x 1 dose, followed by 200 mg daily for 13 days for  presumed candida esophagitis 02/16/24   Federico Rosario BROCKS, MD  fluticasone  (FLONASE ) 50 MCG/ACT nasal spray Place 1 spray into both nostrils daily.    [provider]  guaiFENesin -dextromethorphan (ROBITUSSIN DM) 100-10 MG/5ML syrup Take 5 mLs by mouth every 4 (four) hours as needed for cough. 10/03/23   Amin, Sumayya, MD  ketotifen  (ZADITOR ) 0.025 % ophthalmic solution Place 1 drop into both eyes 2 (two) times daily as needed (itching).    [provider]  magnesium  (MAGTAB) 84 MG ( ) TBCR SR tablet Take 42 mg by mouth 2 (two) times daily. 07/24/21   [provider]  meloxicam (MOBIC) 15 MG tablet Take 15 mg by mouth daily. For knee pain 01/05/23   [provider]  metoCLOPramide  (REGLAN ) 10 MG tablet Take 10 mg by mouth every evening. 07/24/21   [provider]  montelukast  (SINGULAIR ) 10 MG tablet Take 10 mg by mouth at bedtime.    [provider]  naloxone  (NARCAN ) nasal spray 4 mg/0.1 mL Place 1 spray into the nose See admin instructions. SPRAY 1 SPRAY INTO ONE NOSTRIL AS DIRECTED FOR OPIOID OVERDOSE (TURN PERSON ON SIDE AFTER DOSE. IF NO RESPONSE IN 2-3 MINUTES OR PERSON RESPONDS BUT RELAPSES, REPEAT USING A NEW SPRAY DEVICE AND SPRAY INTO THE OTHER NOSTRIL. CALL 911 AFTER USE.) * EMERGENCY USE ONLY * 01/16/20   [provider]  Ofatumumab 20 MG/0.4ML SOAJ Inject 20 mg into the skin every 30 (thirty) days. Kesimpta 11/20/22   [provider]  Omega-3 Fatty Acids (FISH OIL) 1000 MG CAPS Take 1,000 mg by mouth in the morning, at noon, and at bedtime.     [provider]  OXYGEN Inhale into the lungs as needed.    [provider]  phenytoin  (DILANTIN ) 100 MG ER capsule Take 100 mg by mouth 2 (two) times daily. 07/18/20   [provider]  pregabalin  (LYRICA ) 150 MG capsule Take 150-300 mg by mouth See admin instructions. Take 1 capsule by mouth twice a day, then take 2 capsules in the evening 11/12/22   [provider]  salicyclic acid-sulfur (SEBULEX) 2-2 % shampoo Apply 1 application  topically daily as needed for itching. 07/13/22   [provider]  sucralfate  (CARAFATE ) 1 GM/10ML suspension Take 10 mLs (1 g total) by mouth 4 (four) times daily. For 4 weeks 02/16/24   Federico Rosario BROCKS, MD  Tiotropium Bromide Monohydrate (SPIRIVA RESPIMAT) 2.5 MCG/ACT AERS Inhale 1 each into the lungs daily.  [provider]  vitamin C  (ASCORBIC ACID ) 500 MG tablet Take 500 mg by mouth See admin instructions. Take 1 tablet by mouth every 2 days    [provider]  vitamin E 180 MG (400 UNITS) capsule Take 400 Units by mouth daily.    [provider]  zinc sulfate 220 (50 Zn) MG capsule Take 220 mg by mouth every other day. 06/28/22   [provider]    Current Outpatient Medications  Medication Sig Dispense Refill   acetaminophen -codeine  (TYLENOL  #3) 300-30 MG tablet Take 1 tablet by mouth 2 (two) times daily as needed for moderate pain.     albuterol  (PROVENTIL  HFA;VENTOLIN  HFA) 108 (90 BASE) MCG/ACT inhaler Inhale 2 puffs into the lungs every 6 (six) hours as needed for wheezing or shortness of breath.     alfuzosin  (UROXATRAL ) 10 MG 24 hr tablet Take 10 mg by mouth every evening.     amphetamine -dextroamphetamine  (ADDERALL XR) 30 MG 24 hr capsule Take 30 mg by mouth 2 (two) times daily.     aspirin  EC 81 MG tablet Take 81 mg by mouth daily.     baclofen  (LIORESAL ) 20 MG tablet Take 20 mg by mouth 4 (four) times daily.     calcium  carbonate (OS-CAL) 1250 (500 Ca) MG chewable tablet Chew  1 tablet by mouth 2 (two) times daily.     carboxymethylcellulose 1 % ophthalmic solution Place 1 drop into both eyes as needed (dry eyes).     Cholecalciferol (VITAMIN D ) 50 MCG (2000 UT) tablet Take 6,000 Units by mouth daily.     donepezil  (ARICEPT ) 10 MG tablet Take 10 mg by mouth at bedtime.     esomeprazole (NEXIUM) 40 MG capsule Take 40 mg by mouth 2 (two) times daily before a meal.     famotidine  (PEPCID ) 40 MG tablet Take 40 mg by mouth at bedtime.     finasteride  (PROSCAR ) 5 MG tablet Take 5 mg by mouth in the morning.     fluticasone  (FLONASE ) 50 MCG/ACT nasal spray Place 1 spray into both nostrils daily.     magnesium  (MAGTAB) 84 MG ( ) TBCR SR tablet Take 42 mg by mouth 2 (two) times daily.     meloxicam (MOBIC) 15 MG tablet Take 15 mg by mouth daily. For knee pain     metoCLOPramide  (REGLAN ) 10 MG tablet Take 10 mg by mouth every evening.     montelukast  (SINGULAIR ) 10 MG tablet Take 10 mg by mouth at bedtime.     Omega-3 Fatty Acids (FISH OIL) 1000 MG CAPS Take 1,000 mg by mouth in the morning, at noon, and at bedtime.     phenytoin  (DILANTIN ) 100 MG ER capsule Take 100 mg by mouth 2 (two) times daily.     pregabalin  (LYRICA ) 150 MG capsule Take 150-300 mg by mouth See admin instructions. Take 1 capsule by mouth twice a day, then take 2 capsules in the evening     Tiotropium Bromide Monohydrate (SPIRIVA RESPIMAT) 2.5 MCG/ACT AERS Inhale 1 each into the lungs daily.     vitamin C  (ASCORBIC ACID ) 500 MG tablet Take 500 mg by mouth See admin instructions. Take 1 tablet by mouth every 2 days     vitamin E 180 MG (400 UNITS) capsule Take 400 Units by mouth daily.     zinc sulfate 220 (50 Zn) MG capsule Take 220 mg by mouth every other day.     cyanocobalamin  (,VITAMIN B-12,) 1000 MCG/ML injection Inject 1,000 mcg  into the muscle every 28 (twenty-eight) days.     ezetimibe (ZETIA) 10 MG tablet Take 10 mg by mouth daily. (Patient not taking: Reported on 04/19/2024)     fluconazole   (DIFLUCAN ) 100 MG tablet 400 mg x 1 dose, followed by 200 mg daily for 13 days for  presumed candida esophagitis 30 tablet 0   guaiFENesin -dextromethorphan (ROBITUSSIN DM) 100-10 MG/5ML syrup Take 5 mLs by mouth every 4 (four) hours as needed for cough. 118 mL 0   ketotifen  (ZADITOR ) 0.025 % ophthalmic solution Place 1 drop into both eyes 2 (two) times daily as needed (itching).     naloxone  (NARCAN ) nasal spray 4 mg/0.1 mL Place 1 spray into the nose See admin instructions. SPRAY 1 SPRAY INTO ONE NOSTRIL AS DIRECTED FOR OPIOID OVERDOSE (TURN PERSON ON SIDE AFTER DOSE. IF NO RESPONSE IN 2-3 MINUTES OR PERSON RESPONDS BUT RELAPSES, REPEAT USING A NEW SPRAY DEVICE AND SPRAY INTO THE OTHER NOSTRIL. CALL 911 AFTER USE.) * EMERGENCY USE ONLY *     Ofatumumab 20 MG/0.4ML SOAJ Inject 20 mg into the skin every 30 (thirty) days. Kesimpta     OXYGEN Inhale into the lungs as needed. (Patient not taking: Reported on 04/19/2024)     salicyclic acid-sulfur (SEBULEX) 2-2 % shampoo Apply 1 application  topically daily as needed for itching.     sucralfate  (CARAFATE ) 1 GM/10ML suspension Take 10 mLs (1 g total) by mouth 4 (four) times daily. For 4 weeks (Patient not taking: Reported on 04/19/2024) 420 mL 0   Current Facility-Administered Medications  Medication Dose Route Frequency Provider Last Rate Last Admin   0.9 %  sodium chloride  infusion  500 mL Intravenous Once Federico Rosario BROCKS, MD       0.9 %  sodium chloride  infusion  500 mL Intravenous Continuous Hikaru Delorenzo, Inocente HERO, MD        Allergies as of 04/19/2024 - Review Complete 04/19/2024  Allergen Reaction Noted   Penicillins Hives, Other (See Comments), and Rash 01/01/2014   Tape Rash 10/27/2011   Zoloft [sertraline] Rash 12/15/2013   Food Nausea And Vomiting and Other (See Comments) 01/01/2014   Morphine Itching 07/14/2012   Mushroom extract complex (obsolete) Nausea And Vomiting and Other (See Comments) 01/01/2014   Penicillin g Rash 03/01/2007    Pravastatin Other (See Comments) 02/03/2023    Family History  Problem Relation Age of Onset   Dementia Mother    Hypertension Father    Heart attack Father     Social History   Socioeconomic History   Marital status: Single    Spouse name: Not on file   Number of children: Not on file   Years of education: Not on file   Highest education level: Not on file  Occupational History   Not on file  Tobacco Use   Smoking status: Former    Current packs/day: 0.00    Average packs/day: 1 pack/day for 20.0 years (20.0 ttl pk-yrs)    Types: Cigarettes    Start date: 11/02/1986    Quit date: 11/02/2006    Years since quitting: 17.4   Smokeless tobacco: Never  Vaping Use   Vaping status: Never Used  Substance and Sexual Activity   Alcohol  use: Yes    Comment: ocas.   Drug use: No    Comment: medical marijuana   Sexual activity: Never  Other Topics Concern   Not on file  Social History Narrative   Not on file   Social Drivers of Health  Financial Resource Strain: Not on file  Food Insecurity: No Food Insecurity (10/03/2023)   Hunger Vital Sign    Worried About Running Out of Food in the Last Year: Never true    Ran Out of Food in the Last Year: Never true  Transportation Needs: No Transportation Needs (10/03/2023)   PRAPARE - Administrator, Civil Service (Medical): No    Lack of Transportation (Non-Medical): No  Physical Activity: Not on file  Stress: Not on file  Social Connections: Unknown (02/22/2023)   Received from [facility]   Social Network    Social Network: Not on file  Intimate Partner Violence: Not At Risk (10/03/2023)   Humiliation, Afraid, Rape, and Kick questionnaire    Fear of Current or Ex-Partner: No    Emotionally Abused: No    Physically Abused: No    Sexually Abused: No    Review of Systems:  All other review of systems negative except as mentioned in the HPI.  Physical Exam: Vital signs BP (!) 150/88   Pulse 74   Temp 97.7 F  (36.5 C)   Ht 6' 1.5 (1.867 m)   Wt 209 lb (94.8 kg)   SpO2 98%   BMI 27.20 kg/m   General:   Alert,  Well-developed, well-nourished, pleasant and cooperative in NAD Airway:  Mallampati 3 Lungs:  Clear throughout to auscultation.   Heart:  Regular rate and rhythm; no murmurs, clicks, rubs,  or gallops. Abdomen:  Soft, nontender and nondistended. Normal bowel sounds.   Neuro/Psych:  Normal mood and affect. A and O x 3  Inocente Hausen, MD [facility]

## 2024-04-19 ENCOUNTER — Encounter: Payer: Self-pay | Admitting: Pediatrics

## 2024-04-19 ENCOUNTER — Ambulatory Visit: Admitting: Pediatrics

## 2024-04-19 VITALS — BP 140/89 | HR 77 | Temp 97.7°F | Resp 15 | Ht 73.5 in | Wt 209.0 lb

## 2024-04-19 DIAGNOSIS — K449 Diaphragmatic hernia without obstruction or gangrene: Secondary | ICD-10-CM

## 2024-04-19 DIAGNOSIS — K219 Gastro-esophageal reflux disease without esophagitis: Secondary | ICD-10-CM

## 2024-04-19 DIAGNOSIS — B3781 Candidal esophagitis: Secondary | ICD-10-CM

## 2024-04-19 DIAGNOSIS — K209 Esophagitis, unspecified without bleeding: Secondary | ICD-10-CM

## 2024-04-19 MED ORDER — SODIUM CHLORIDE 0.9 % IV SOLN: 500.0000 mL | INTRAVENOUS | Status: DC

## 2024-04-19 NOTE — Progress Notes (Signed)
 Called to room to assist during endoscopic procedure.  Patient ID and intended procedure confirmed with present staff. Received instructions for my participation in the procedure from the performing physician.

## 2024-04-19 NOTE — Progress Notes (Signed)
 Sedate, gd SR, tolerated procedure well, VSS, report to RN

## 2024-04-19 NOTE — Patient Instructions (Signed)

## 2024-04-19 NOTE — Op Note (Signed)
 [facility] Patient Name: Rachael Zapanta Procedure Date: 04/19/2024 8:35 AM MRN: [id number] Endoscopist: Inocente Hausen , MD, [id number] Age: 58 Referring MD:  Date of Birth: [birth year] Gender: Male Account #: [id number] Procedure:                Upper GI endoscopy Indications:              Heartburn, Follow-up of Barrett's esophagus,                            Follow-up of esophagitis - EGD 01/2024 showed LA                            grade C esophagitis. EGD is performed today for                            confirmation of healing of esophagitis and to rule                            out Barrett's esophagus. Medicines:                Monitored Anesthesia Care Procedure:                Pre-Anesthesia Assessment:                           - Prior to the procedure, a History and Physical                            was performed, and patient medications and                            allergies were reviewed. The patient's tolerance of                            previous anesthesia was also reviewed. The risks                            and benefits of the procedure and the sedation                            options and risks were discussed with the patient.                            All questions were answered, and informed consent                            was obtained. Prior Anticoagulants: The patient has                            taken no anticoagulant or antiplatelet agents. ASA                            Grade Assessment: III - A patient with severe  systemic disease. After reviewing the risks and                            benefits, the patient was deemed in satisfactory                            condition to undergo the procedure.                           After obtaining informed consent, the endoscope was                            passed under direct vision. Throughout the                            procedure, the patient's blood pressure,  pulse, and                            oxygen saturations were monitored continuously. The                            GIF [id number] #[id number] was introduced through the                            mouth, and advanced to the second part of duodenum.                            The upper GI endoscopy was accomplished without                            difficulty. The patient tolerated the procedure                            well. Scope In: Scope Out: Findings:                 Patchy, white plaques were found in the upper third                            of the esophagus and in the lower third of the                            esophagus. Biopsies were taken with a cold forceps                            for histology.                           The exam of the esophagus was otherwise normal.                           Normal mucosa was found in the gastric body, in the  gastric antrum, in the cardia (on retroflexion) and                            in the gastric fundus (on retroflexion).                           A medium-sized hiatal hernia was present.                           The duodenal bulb and second portion of the                            duodenum were normal. Complications:            No immediate complications. Estimated blood loss:                            Minimal. Estimated Blood Loss:     Estimated blood loss was minimal. Impression:               - Esophageal plaques were found, suspicious for                            candidiasis. Biopsied.                           - Normal mucosa was found in the gastric body, in                            the antrum, in the cardia and in the gastric fundus.                           - Medium-sized hiatal hernia.                           - Normal duodenal bulb and second portion of the                            duodenum. Recommendation:           - Discharge patient to home (ambulatory).                            - Await pathology results. If biopsies confirm                            presence of Candida will treat with fluconazole  or                            nystatin if there are drug interactions with                            fluconazole .                           - Continue present medications. If patient is still  taking Nexium 40 mg p.o. twice daily can decrease                            to once daily dosing in conjunction with famotidine                             in the evening.                           - The findings and recommendations were discussed                            with the designated responsible adult.                           - Patient has a contact number available for                            emergencies. The signs and symptoms of potential                            delayed complications were discussed with the                            patient. Return to normal activities tomorrow.                            Written discharge instructions were provided to the                            patient. Inocente Hausen, MD 04/19/2024 8:54:42 AM This report has been signed electronically.

## 2024-04-20 ENCOUNTER — Other Ambulatory Visit: Payer: Self-pay

## 2024-04-20 ENCOUNTER — Telehealth: Payer: Self-pay

## 2024-04-20 NOTE — Telephone Encounter (Signed)
  Follow up Call-     04/19/2024    8:18 AM 02/16/2024    2:54 PM  Call back number  Post procedure Call Back phone  # [phone]  Permission to leave phone message Yes Yes     Patient questions:  Do you have a fever, pain , or abdominal swelling? No. Pain Score  0 *  Have you tolerated food without any problems? Yes.    Have you been able to return to your normal activities? Yes.    Do you have any questions about your discharge instructions: Diet   No. Medications  No. Follow up visit  No.  Do you have questions or concerns about your Care? No.  Actions: * If pain score is 4 or above: No action needed, pain <4.

## 2024-04-21 LAB — SURGICAL PATHOLOGY

## 2024-04-23 ENCOUNTER — Ambulatory Visit: Payer: Self-pay | Admitting: Pediatrics

## 2024-04-23 DIAGNOSIS — K2270 Barrett's esophagus without dysplasia: Secondary | ICD-10-CM

## 2024-04-23 DIAGNOSIS — K209 Esophagitis, unspecified without bleeding: Secondary | ICD-10-CM

## 2024-04-23 DIAGNOSIS — K219 Gastro-esophageal reflux disease without esophagitis: Secondary | ICD-10-CM

## 2024-04-25 MED ORDER — FLUCONAZOLE 200 MG PO TABS: ORAL_TABLET | ORAL | 0 refills | Status: DC

## 2024-04-25 MED ORDER — FLUCONAZOLE 200 MG PO TABS: ORAL_TABLET | ORAL | 0 refills | Status: AC

## 2024-04-25 NOTE — Addendum Note (Signed)
 Addended by: Dashan Chizmar N on: 04/25/2024 11:32 AM   Modules accepted: Orders

## 2024-04-25 NOTE — Addendum Note (Signed)
 Addended by: Jentzen Minasyan N on: 04/25/2024 08:59 AM   Modules accepted: Orders

## 2024-04-26 DIAGNOSIS — H527 Unspecified disorder of refraction: Secondary | ICD-10-CM | POA: Diagnosis not present

## 2024-05-31 ENCOUNTER — Ambulatory Visit: Admitting: Emergency Medicine

## 2024-05-31 ENCOUNTER — Encounter: Payer: Self-pay | Admitting: Emergency Medicine

## 2024-05-31 VITALS — BP 132/80 | HR 78 | Temp 94.0°F | Ht 73.5 in | Wt 218.0 lb

## 2024-05-31 DIAGNOSIS — R918 Other nonspecific abnormal finding of lung field: Secondary | ICD-10-CM

## 2024-05-31 DIAGNOSIS — J449 Chronic obstructive pulmonary disease, unspecified: Secondary | ICD-10-CM

## 2024-05-31 DIAGNOSIS — R911 Solitary pulmonary nodule: Secondary | ICD-10-CM | POA: Insufficient documentation

## 2024-05-31 NOTE — Patient Instructions (Addendum)
 I glad that you have been doing well. Continue your Spiriva and Asmanex  as you have been taking them.  Remember to rinse and gargle after using your Asmanex  Keep your albuterol  available to use 2 puffs when needed for shortness of breath, chest tightness, wheezing. We reviewed your CT scans of the chest.  You should not need another dedicated surveillance CT chest since there has been significant improvement.  We will continue to follow you clinically and decide whether you need repeat imaging depending on how you are feeling. Follow Dr. Shelah in 1 year.  Please call sooner if you have any questions or problems.

## 2024-05-31 NOTE — Progress Notes (Signed)
 Subjective:    Patient ID: Randy Greene, male    DOB: 1965/10/26, 58 y.o.   MRN: [id number]  HPI  ROV 05/31/2024 --58 year old man with history of MS and associated chronic aspiration, on immunosuppression.  Also with a history of suspected mild obstructive lung disease, OSA on CPAP, GERD, abnormal CT scan of the chest with chronic right hilar changes with mediastinal and hilar adenopathy, masslike right upper lobe consolidation at the lateral pleural surface, right mid lobe airspace disease.  We repeated his CT chest in 07/2023 and this showed significant improvement in the right upper lobe, some patchy waxing and waning airspace opacities.  He was admitted for another pneumonia in April, suspected related to aspiration.  Subsequent CT imaging has been done in June and in October. Currently managed on Asmanex , Spiriva, Singulair .  Uses albuterol  approximately once a week - does help him. Often related to an exposure, dust. Has not had the flu shot this year.  He started IVIg 4 months ago - has helped him a lot. He also has an adjustable bed. Less aspiration, but he did have to take azithro last week.   CT chest 12/15/2023 reviewed by me shows stable calcified hilar and mediastinal nodes, further resolution of his previously noted multifocal bilateral ground glass densities and focal consolidation especially in the right upper lobe and right middle lobe  CT scan of the chest 04/06/2024 reviewed by me shows unchanged bandlike scarring in the lateral segment of the right middle lobe, decreased irregular opacity in the peripheral right upper lobe corresponding with improving pneumonia/scar.    Review of Systems As per HPi      Objective:   Physical Exam Vitals:   05/31/24 1252 05/31/24 1317  BP: (!) 146/88 132/80  Pulse: 78   Temp: (!) 94 F (34.4 C)   SpO2: 97%   Weight: 218 lb (98.9 kg)   Height: 6' 1.5 (1.867 m)     Gen: Pleasant, well-nourished, in no distress,  normal  affect.   ENT: No lesions, oropharynx clear.  Strong voice.  No cough  Neck: No JVD, no stridor  Lungs: No use of accessory muscles, no crackles or wheezing on normal respiration, no wheeze on forced expiration  Cardiovascular: Distant RRR, heart sounds normal, no murmur or gallops, no peripheral edema  Musculoskeletal: No deformities, no cyanosis or clubbing  Neuro: alert, awake, non focal  Skin: Warm, no lesions or rash      Assessment & Plan:  COPD (chronic obstructive pulmonary disease) (HCC) Continue your Spiriva and Asmanex  as you have been taking them.  Remember to rinse and gargle after using your Asmanex  Keep your albuterol  available to use 2 puffs when needed for shortness of breath, chest tightness, wheezing. Follow Dr. Shelah in 1 year.  Please call sooner if you have any questions or problems.  Pulmonary nodule Residual nodular opacity in the location of his treated right upper lobe pneumonia.  Stable/improved on serial imaging.  I do not think he needs any more dedicated follow-up of this region unless there is a clinical change.  We reviewed your CT scans of the chest.  You should not need another dedicated surveillance CT chest since there has been significant improvement.  We will continue to follow you clinically and decide whether you need repeat imaging depending on how you are feeling.  I personally spent a total of 33 minutes in the care of the patient today including preparing to see the patient, getting/reviewing separately obtained  history, performing a medically appropriate exam/evaluation, counseling and educating, documenting clinical information in the EHR, independently interpreting results, and communicating results.    Lamar Chris, MD, PhD 05/31/2024, 5:06 PM [facility] [phone] or if no answer before 7:00PM call [phone] For any issues after 7:00PM please call eLink [phone]

## 2024-05-31 NOTE — Assessment & Plan Note (Signed)
 Continue your Spiriva and Asmanex  as you have been taking them.  Remember to rinse and gargle after using your Asmanex  Keep your albuterol  available to use 2 puffs when needed for shortness of breath, chest tightness, wheezing. Follow Dr. Shelah in 1 year.  Please call sooner if you have any questions or problems.

## 2024-05-31 NOTE — Assessment & Plan Note (Signed)
 Residual nodular opacity in the location of his treated right upper lobe pneumonia.  Stable/improved on serial imaging.  I do not think he needs any more dedicated follow-up of this region unless there is a clinical change.  We reviewed your CT scans of the chest.  You should not need another dedicated surveillance CT chest since there has been significant improvement.  We will continue to follow you clinically and decide whether you need repeat imaging depending on how you are feeling.
